# Patient Record
Sex: Female | Born: 1935 | Race: Black or African American | Hispanic: No | Marital: Married | Smoking: Former smoker
Health system: Southern US, Community
[De-identification: ages and names within clinical notes are randomized; demographics above are authoritative.]

## PROBLEM LIST (undated history)

## (undated) DIAGNOSIS — I219 Acute myocardial infarction, unspecified: Secondary | ICD-10-CM

## (undated) DIAGNOSIS — M329 Systemic lupus erythematosus, unspecified: Secondary | ICD-10-CM

## (undated) DIAGNOSIS — H3530 Unspecified macular degeneration: Secondary | ICD-10-CM

## (undated) DIAGNOSIS — I10 Essential (primary) hypertension: Secondary | ICD-10-CM

## (undated) DIAGNOSIS — K219 Gastro-esophageal reflux disease without esophagitis: Secondary | ICD-10-CM

## (undated) DIAGNOSIS — M810 Age-related osteoporosis without current pathological fracture: Secondary | ICD-10-CM

## (undated) DIAGNOSIS — N189 Chronic kidney disease, unspecified: Secondary | ICD-10-CM

## (undated) DIAGNOSIS — T7840XA Allergy, unspecified, initial encounter: Secondary | ICD-10-CM

## (undated) DIAGNOSIS — M1990 Unspecified osteoarthritis, unspecified site: Secondary | ICD-10-CM

## (undated) HISTORY — DX: Essential (primary) hypertension: I10

## (undated) HISTORY — PX: TONSILLECTOMY: SUR1361

## (undated) HISTORY — PX: TEMPORAL ARTERY BIOPSY / LIGATION: SUR132

## (undated) HISTORY — DX: Gastro-esophageal reflux disease without esophagitis: K21.9

## (undated) HISTORY — DX: Unspecified osteoarthritis, unspecified site: M19.90

## (undated) HISTORY — PX: EYE SURGERY: SHX253

## (undated) HISTORY — DX: Allergy, unspecified, initial encounter: T78.40XA

## (undated) HISTORY — PX: COLON SURGERY: SHX602

## (undated) HISTORY — PX: HERNIA REPAIR: SHX51

## (undated) HISTORY — PX: ABDOMINAL HYSTERECTOMY: SHX81

## (undated) HISTORY — DX: Age-related osteoporosis without current pathological fracture: M81.0

---

## 2000-03-18 DIAGNOSIS — M329 Systemic lupus erythematosus, unspecified: Secondary | ICD-10-CM

## 2000-03-18 DIAGNOSIS — IMO0002 Reserved for concepts with insufficient information to code with codable children: Secondary | ICD-10-CM

## 2000-03-18 HISTORY — DX: Reserved for concepts with insufficient information to code with codable children: IMO0002

## 2000-03-18 HISTORY — DX: Systemic lupus erythematosus, unspecified: M32.9

## 2005-03-18 DIAGNOSIS — I219 Acute myocardial infarction, unspecified: Secondary | ICD-10-CM

## 2005-03-18 HISTORY — DX: Acute myocardial infarction, unspecified: I21.9

## 2009-11-01 ENCOUNTER — Ambulatory Visit: Payer: Self-pay | Admitting: Nephrology

## 2010-03-13 ENCOUNTER — Ambulatory Visit: Payer: Self-pay | Admitting: Family Medicine

## 2010-11-14 ENCOUNTER — Ambulatory Visit: Payer: Self-pay | Admitting: Ophthalmology

## 2011-01-21 ENCOUNTER — Ambulatory Visit: Payer: Self-pay | Admitting: Family Medicine

## 2013-08-17 DIAGNOSIS — M810 Age-related osteoporosis without current pathological fracture: Secondary | ICD-10-CM | POA: Insufficient documentation

## 2013-09-08 ENCOUNTER — Ambulatory Visit: Payer: Self-pay | Admitting: Family Medicine

## 2014-08-19 ENCOUNTER — Other Ambulatory Visit: Payer: Self-pay | Admitting: Family Medicine

## 2014-08-19 ENCOUNTER — Other Ambulatory Visit: Payer: Commercial Managed Care - HMO

## 2014-08-19 ENCOUNTER — Encounter: Payer: Self-pay | Admitting: Family Medicine

## 2014-08-19 DIAGNOSIS — D510 Vitamin B12 deficiency anemia due to intrinsic factor deficiency: Secondary | ICD-10-CM | POA: Insufficient documentation

## 2014-08-19 DIAGNOSIS — K219 Gastro-esophageal reflux disease without esophagitis: Secondary | ICD-10-CM | POA: Insufficient documentation

## 2014-08-19 DIAGNOSIS — I129 Hypertensive chronic kidney disease with stage 1 through stage 4 chronic kidney disease, or unspecified chronic kidney disease: Secondary | ICD-10-CM | POA: Insufficient documentation

## 2014-08-19 DIAGNOSIS — M1A9XX1 Chronic gout, unspecified, with tophus (tophi): Secondary | ICD-10-CM | POA: Insufficient documentation

## 2014-08-19 DIAGNOSIS — M810 Age-related osteoporosis without current pathological fracture: Secondary | ICD-10-CM | POA: Insufficient documentation

## 2014-08-19 DIAGNOSIS — M3214 Glomerular disease in systemic lupus erythematosus: Secondary | ICD-10-CM | POA: Insufficient documentation

## 2014-08-19 DIAGNOSIS — N184 Chronic kidney disease, stage 4 (severe): Secondary | ICD-10-CM | POA: Insufficient documentation

## 2014-08-19 DIAGNOSIS — J309 Allergic rhinitis, unspecified: Secondary | ICD-10-CM | POA: Insufficient documentation

## 2014-08-19 DIAGNOSIS — E785 Hyperlipidemia, unspecified: Secondary | ICD-10-CM | POA: Insufficient documentation

## 2014-08-19 DIAGNOSIS — M316 Other giant cell arteritis: Secondary | ICD-10-CM | POA: Insufficient documentation

## 2014-08-19 DIAGNOSIS — M329 Systemic lupus erythematosus, unspecified: Secondary | ICD-10-CM

## 2014-08-19 DIAGNOSIS — M069 Rheumatoid arthritis, unspecified: Secondary | ICD-10-CM | POA: Insufficient documentation

## 2014-08-20 LAB — CBC WITH DIFFERENTIAL/PLATELET
Basophils Absolute: 0 10*3/uL (ref 0.0–0.2)
Basos: 1 %
EOS (ABSOLUTE): 0.1 10*3/uL (ref 0.0–0.4)
Eos: 1 %
HEMATOCRIT: 35.7 % (ref 34.0–46.6)
Hemoglobin: 11.1 g/dL (ref 11.1–15.9)
IMMATURE GRANS (ABS): 0 10*3/uL (ref 0.0–0.1)
Immature Granulocytes: 0 %
LYMPHS: 31 %
Lymphocytes Absolute: 2.6 10*3/uL (ref 0.7–3.1)
MCH: 28.2 pg (ref 26.6–33.0)
MCHC: 31.1 g/dL — AB (ref 31.5–35.7)
MCV: 91 fL (ref 79–97)
Monocytes Absolute: 0.6 10*3/uL (ref 0.1–0.9)
Monocytes: 8 %
NEUTROS ABS: 4.8 10*3/uL (ref 1.4–7.0)
NEUTROS PCT: 59 %
PLATELETS: 208 10*3/uL (ref 150–379)
RBC: 3.94 x10E6/uL (ref 3.77–5.28)
RDW: 15.5 % — AB (ref 12.3–15.4)
WBC: 8.2 10*3/uL (ref 3.4–10.8)

## 2014-08-20 LAB — SEDIMENTATION RATE: SED RATE: 21 mm/h (ref 0–40)

## 2014-09-12 ENCOUNTER — Encounter: Payer: Self-pay | Admitting: Family Medicine

## 2014-09-12 ENCOUNTER — Ambulatory Visit (INDEPENDENT_AMBULATORY_CARE_PROVIDER_SITE_OTHER): Payer: Commercial Managed Care - HMO | Admitting: Family Medicine

## 2014-09-12 VITALS — BP 136/82 | HR 77 | Temp 98.5°F | Ht 63.6 in | Wt 177.0 lb

## 2014-09-12 DIAGNOSIS — M069 Rheumatoid arthritis, unspecified: Secondary | ICD-10-CM

## 2014-09-12 DIAGNOSIS — M316 Other giant cell arteritis: Secondary | ICD-10-CM | POA: Diagnosis not present

## 2014-09-12 DIAGNOSIS — R51 Headache: Secondary | ICD-10-CM

## 2014-09-12 DIAGNOSIS — I10 Essential (primary) hypertension: Secondary | ICD-10-CM

## 2014-09-12 DIAGNOSIS — M3214 Glomerular disease in systemic lupus erythematosus: Secondary | ICD-10-CM | POA: Diagnosis not present

## 2014-09-12 DIAGNOSIS — R519 Headache, unspecified: Secondary | ICD-10-CM

## 2014-09-12 NOTE — Assessment & Plan Note (Signed)
The current medical regimen is effective;  continue present plan and medications.  

## 2014-09-12 NOTE — Assessment & Plan Note (Signed)
Followed at [facility] 

## 2014-09-12 NOTE — Progress Notes (Signed)
BP 136/82 mmHg  Pulse 77  Temp(Src) 98.5 F (36.9 C)  Ht 5' 3.6" (1.615 m)  Wt 177 lb (80.287 kg)  BMI 30.78 kg/m2  SpO2 98%   Subjective:    Patient ID: Marissa Hess, female    DOB: [birth year], 80 y.o.   MRN: [id number]  HPI: Marissa Hess is a 80 y.o. female  Chief Complaint  Patient presents with  . Headache  posterior neck and head tightness not pounding headache last 2 weeks Cant ly on side as hurts more. Tylenol eases pain but does not last just comes baclk. No other sx dizzness, vision other pain.   Relevant past medical, surgical, family and social history reviewed and updated as indicated. Interim medical history since our last visit reviewed. Allergies and medications reviewed and updated.  Review of Systems  Constitutional: Negative.   Respiratory: Negative.   Cardiovascular: Negative.     Per HPI unless specifically indicated above     Objective:    BP 136/82 mmHg  Pulse 77  Temp(Src) 98.5 F (36.9 C)  Ht 5' 3.6" (1.615 m)  Wt 177 lb (80.287 kg)  BMI 30.78 kg/m2  SpO2 98%  Wt Readings from Last 3 Encounters:  09/12/14 177 lb (80.287 kg)  11/03/13 167 lb (75.751 kg)    Physical Exam  Constitutional: She is oriented to person, place, and time. She appears well-developed and well-nourished. No distress.  HENT:  Head: Normocephalic and atraumatic.  Right Ear: Hearing and external ear normal.  Left Ear: Hearing and external ear normal.  Nose: Nose normal.  Mouth/Throat: No oropharyngeal exudate.  Eyes: Conjunctivae and lids are normal. Pupils are equal, round, and reactive to light. Right eye exhibits no discharge. Left eye exhibits no discharge. No scleral icterus.  Neck: Normal range of motion. Neck supple. No thyromegaly present.  Cardiovascular: Normal rate, regular rhythm and normal heart sounds.   Pulmonary/Chest: Effort normal. No respiratory distress.  Musculoskeletal: Normal range of motion.  Lymphadenopathy:    She has no  cervical adenopathy.  Neurological: She is alert and oriented to person, place, and time. No cranial nerve deficit. Coordination normal.  Skin: Skin is intact. No rash noted.  Psychiatric: She has a normal mood and affect. Her speech is normal and behavior is normal. Judgment and thought content normal. Cognition and memory are normal.    Results for orders placed or performed in visit on 08/19/14  CBC with Differential/Platelet  Result Value Ref Range   WBC 8.2 3.4 - 10.8 x10E3/uL   RBC 3.94 3.77 - 5.28 x10E6/uL   Hemoglobin 11.1 11.1 - 15.9 g/dL   Hematocrit 35.7 34.0 - 46.6 %   MCV 91 79 - 97 fL   MCH 28.2 26.6 - 33.0 pg   MCHC 31.1 (L) 31.5 - 35.7 g/dL   RDW 15.5 (H) 12.3 - 15.4 %   Platelets 208 150 - 379 x10E3/uL   NEUTROPHILS 59 %   Lymphs 31 %   Monocytes 8 %   Eos 1 %   Basos 1 %   Neutrophils Absolute 4.8 1.4 - 7.0 x10E3/uL   Lymphocytes Absolute 2.6 0.7 - 3.1 x10E3/uL   Monocytes Absolute 0.6 0.1 - 0.9 x10E3/uL   EOS (ABSOLUTE) 0.1 0.0 - 0.4 x10E3/uL   Basophils Absolute 0.0 0.0 - 0.2 x10E3/uL   Immature Granulocytes 0 %   Immature Grans (Abs) 0.0 0.0 - 0.1 x10E3/uL  Sedimentation rate  Result Value Ref Range   Sed Rate 21 0 -  40 mm/hr      Assessment & Plan:   Problem List Items Addressed This Visit      Cardiovascular and Mediastinum   Temporal arteritis - Primary (Chronic)    Followed at [facility]      Benign hypertension (Chronic)    The current medical regimen is effective;  continue present plan and medications.         Musculoskeletal and Integument   RA (rheumatoid arthritis) (Chronic)    The current medical regimen is effective;  continue present plan and medications.       Relevant Medications   predniSONE (DELTASONE) 20 MG tablet     Genitourinary   Lupus glomerulonephritis (Chronic)    Followed at [facility]       Other Visit Diagnoses    Headache disorder        discussed headache not inflamatory mostly muscle contraction and  artheritis will go to massage tx and ROM exercised tylenol OK    Relevant Medications    clonazePAM (KLONOPIN) 0.5 MG tablet        Follow up plan: Return if symptoms worsen or fail to improve, for Physical Exam has apt.

## 2014-09-26 ENCOUNTER — Other Ambulatory Visit: Payer: Self-pay | Admitting: Family Medicine

## 2014-09-26 ENCOUNTER — Ambulatory Visit (INDEPENDENT_AMBULATORY_CARE_PROVIDER_SITE_OTHER): Payer: Commercial Managed Care - HMO | Admitting: Family Medicine

## 2014-09-26 ENCOUNTER — Other Ambulatory Visit: Payer: Commercial Managed Care - HMO

## 2014-09-26 ENCOUNTER — Encounter: Payer: Self-pay | Admitting: Family Medicine

## 2014-09-26 VITALS — BP 138/80 | HR 66 | Temp 98.6°F | Ht 64.0 in | Wt 177.0 lb

## 2014-09-26 DIAGNOSIS — M316 Other giant cell arteritis: Secondary | ICD-10-CM | POA: Diagnosis not present

## 2014-09-26 DIAGNOSIS — K219 Gastro-esophageal reflux disease without esophagitis: Secondary | ICD-10-CM | POA: Diagnosis not present

## 2014-09-26 DIAGNOSIS — M3214 Glomerular disease in systemic lupus erythematosus: Secondary | ICD-10-CM | POA: Diagnosis not present

## 2014-09-26 DIAGNOSIS — G2581 Restless legs syndrome: Secondary | ICD-10-CM | POA: Diagnosis not present

## 2014-09-26 DIAGNOSIS — I10 Essential (primary) hypertension: Secondary | ICD-10-CM | POA: Diagnosis not present

## 2014-09-26 DIAGNOSIS — Z0000 Encounter for general adult medical examination without abnormal findings: Secondary | ICD-10-CM | POA: Diagnosis not present

## 2014-09-26 DIAGNOSIS — R700 Elevated erythrocyte sedimentation rate: Secondary | ICD-10-CM

## 2014-09-26 LAB — MICROSCOPIC EXAMINATION

## 2014-09-26 LAB — URINALYSIS, ROUTINE W REFLEX MICROSCOPIC
BILIRUBIN UA: NEGATIVE
Glucose, UA: NEGATIVE
Ketones, UA: NEGATIVE
Nitrite, UA: NEGATIVE
PROTEIN UA: NEGATIVE
SPEC GRAV UA: 1.01 (ref 1.005–1.030)
Urobilinogen, Ur: 0.2 mg/dL (ref 0.2–1.0)
pH, UA: 5 (ref 5.0–7.5)

## 2014-09-26 MED ORDER — FOLIC ACID 1 MG PO TABS: 1.0000 mg | ORAL_TABLET | Freq: Every day | ORAL | Status: DC

## 2014-09-26 MED ORDER — OMEPRAZOLE 20 MG PO CPDR: 20.0000 mg | DELAYED_RELEASE_CAPSULE | Freq: Every day | ORAL | Status: DC

## 2014-09-26 MED ORDER — CARVEDILOL 25 MG PO TABS
25.0000 mg | ORAL_TABLET | Freq: Two times a day (BID) | ORAL | Status: DC
Start: 2014-09-26 — End: 2015-10-16

## 2014-09-26 NOTE — Assessment & Plan Note (Signed)
The current medical regimen is effective;  continue present plan and medications.  

## 2014-09-26 NOTE — Assessment & Plan Note (Signed)
Reviewed and stable.

## 2014-09-26 NOTE — Assessment & Plan Note (Addendum)
Reviewed tx and drawing labs for [facility]

## 2014-09-26 NOTE — Progress Notes (Signed)
BP 138/80 mmHg  Pulse 66  Temp(Src) 98.6 F (37 C)  Ht 5\' 4"  (1.626 m)  Wt 177 lb (80.287 kg)  BMI 30.37 kg/m2  SpO2 99%   Subjective:    Patient ID: Marissa Hess, female    DOB: 09/23/1934, 80 y.o.   MRN: [id number]  HPI: Marissa Hess is a 80 y.o. female  Chief Complaint  Patient presents with  . Annual Exam  AWV done, depression screen falls risk done (computer issues still [facility] has not loaded AWV yet) Medical issues still headaches behind ears mostly left in spite of tx Tylenol helps BP doing well  reflux stable klonopin for legs takes occ Lasix every other day  Relevant past medical, surgical, family and social history reviewed and updated as indicated. Interim medical history since our last visit reviewed. Allergies and medications reviewed and updated.  Review of Systems  Constitutional: Negative.   HENT: Negative.   Eyes: Negative.   Respiratory: Negative.   Cardiovascular: Negative.   Gastrointestinal: Negative.   Endocrine: Negative.   Genitourinary: Negative.   Musculoskeletal: Negative.   Skin: Negative.   Allergic/Immunologic: Negative.   Neurological: Negative.   Hematological: Negative.   Psychiatric/Behavioral: Negative.     Per HPI unless specifically indicated above     Objective:    BP 138/80 mmHg  Pulse 66  Temp(Src) 98.6 F (37 C)  Ht 5\' 4"  (1.626 m)  Wt 177 lb (80.287 kg)  BMI 30.37 kg/m2  SpO2 99%  Wt Readings from Last 3 Encounters:  09/26/14 177 lb (80.287 kg)  09/12/14 177 lb (80.287 kg)  11/03/13 167 lb (75.751 kg)    Physical Exam  Constitutional: She is oriented to person, place, and time. She appears well-developed and well-nourished.  HENT:  Head: Normocephalic and atraumatic.  Right Ear: External ear normal.  Left Ear: External ear normal.  Nose: Nose normal.  Mouth/Throat: Oropharynx is clear and moist.  Eyes: Conjunctivae and EOM are normal. Pupils are equal, round, and reactive to light.  Neck: Normal  range of motion. Neck supple. Carotid bruit is not present.  Cardiovascular: Normal rate, regular rhythm and normal heart sounds.   No murmur heard. Pulmonary/Chest: Effort normal and breath sounds normal. Right breast exhibits no mass and no tenderness. Left breast exhibits no mass and no tenderness. Breasts are symmetrical.  Abdominal: Soft. Bowel sounds are normal. There is no hepatosplenomegaly.  Musculoskeletal: Normal range of motion.  Neurological: She is alert and oriented to person, place, and time.  Skin: No rash noted.  Psychiatric: She has a normal mood and affect. Her behavior is normal. Judgment and thought content normal.    Results for orders placed or performed in visit on 08/19/14  CBC with Differential/Platelet  Result Value Ref Range   WBC 8.2 3.4 - 10.8 x10E3/uL   RBC 3.94 3.77 - 5.28 x10E6/uL   Hemoglobin 11.1 11.1 - 15.9 g/dL   Hematocrit 35.7 34.0 - 46.6 %   MCV 91 79 - 97 fL   MCH 28.2 26.6 - 33.0 pg   MCHC 31.1 (L) 31.5 - 35.7 g/dL   RDW 15.5 (H) 12.3 - 15.4 %   Platelets 208 150 - 379 x10E3/uL   NEUTROPHILS 59 %   Lymphs 31 %   Monocytes 8 %   Eos 1 %   Basos 1 %   Neutrophils Absolute 4.8 1.4 - 7.0 x10E3/uL   Lymphocytes Absolute 2.6 0.7 - 3.1 x10E3/uL   Monocytes Absolute 0.6 0.1 -  0.9 x10E3/uL   EOS (ABSOLUTE) 0.1 0.0 - 0.4 x10E3/uL   Basophils Absolute 0.0 0.0 - 0.2 x10E3/uL   Immature Granulocytes 0 %   Immature Grans (Abs) 0.0 0.0 - 0.1 x10E3/uL  Sedimentation rate  Result Value Ref Range   Sed Rate 21 0 - 40 mm/hr      Assessment & Plan:   Problem List Items Addressed This Visit      Cardiovascular and Mediastinum   Temporal arteritis - Primary (Chronic)    Reviewed tx and drawing labs for [facility]      Relevant Medications   carvedilol (COREG) 25 MG tablet   Other Relevant Orders   Comprehensive metabolic panel   CBC with Differential/Platelet   Urinalysis, Routine w reflex microscopic (not at [facility])   TSH   Lipid panel   Benign  hypertension (Chronic)    The current medical regimen is effective;  continue present plan and medications.       Relevant Medications   carvedilol (COREG) 25 MG tablet   Other Relevant Orders   Comprehensive metabolic panel   CBC with Differential/Platelet   Urinalysis, Routine w reflex microscopic (not at [facility])   TSH     Digestive   Esophageal reflux (Chronic)    The current medical regimen is effective;  continue present plan and medications.       Relevant Medications   omeprazole (PRILOSEC) 20 MG capsule   Other Relevant Orders   Comprehensive metabolic panel   CBC with Differential/Platelet   Urinalysis, Routine w reflex microscopic (not at [facility])     Genitourinary   Lupus glomerulonephritis (Chronic)    Reviewed and stable      Relevant Medications   folic acid (FOLVITE) 1 MG tablet   Other Relevant Orders   Comprehensive metabolic panel   CBC with Differential/Platelet   Urinalysis, Routine w reflex microscopic (not at [facility])   TSH   Lipid panel     Other   Restless leg syndrome    The current medical regimen is effective;  continue present plan and medications.       Relevant Orders   Comprehensive metabolic panel   CBC with Differential/Platelet   Urinalysis, Routine w reflex microscopic (not at [facility])   TSH    Other Visit Diagnoses    PE (physical exam), annual        Relevant Orders    Comprehensive metabolic panel    CBC with Differential/Platelet    Urinalysis, Routine w reflex microscopic (not at [facility])    TSH    Lipid panel        Follow up plan: Return in about 6 months (around 03/29/2015), or if symptoms worsen or fail to improve, for med check .

## 2014-09-27 LAB — CBC WITH DIFFERENTIAL/PLATELET
BASOS: 1 %
Basophils Absolute: 0 10*3/uL (ref 0.0–0.2)
EOS (ABSOLUTE): 0.1 10*3/uL (ref 0.0–0.4)
EOS: 2 %
Hematocrit: 36.4 % (ref 34.0–46.6)
Hemoglobin: 11.2 g/dL (ref 11.1–15.9)
IMMATURE GRANULOCYTES: 0 %
Immature Grans (Abs): 0 10*3/uL (ref 0.0–0.1)
LYMPHS: 28 %
Lymphocytes Absolute: 2 10*3/uL (ref 0.7–3.1)
MCH: 27.4 pg (ref 26.6–33.0)
MCHC: 30.8 g/dL — AB (ref 31.5–35.7)
MCV: 89 fL (ref 79–97)
MONOCYTES: 9 %
MONOS ABS: 0.6 10*3/uL (ref 0.1–0.9)
NEUTROS ABS: 4.4 10*3/uL (ref 1.4–7.0)
Neutrophils: 60 %
PLATELETS: 234 10*3/uL (ref 150–379)
RBC: 4.09 x10E6/uL (ref 3.77–5.28)
RDW: 17.1 % — ABNORMAL HIGH (ref 12.3–15.4)
WBC: 7.1 10*3/uL (ref 3.4–10.8)

## 2014-09-27 LAB — COMPREHENSIVE METABOLIC PANEL
ALBUMIN: 3.9 g/dL (ref 3.5–4.8)
ALT: 8 IU/L (ref 0–32)
AST: 17 IU/L (ref 0–40)
Albumin/Globulin Ratio: 1.4 (ref 1.1–2.5)
Alkaline Phosphatase: 43 IU/L (ref 39–117)
BUN / CREAT RATIO: 17 (ref 11–26)
BUN: 25 mg/dL (ref 8–27)
Bilirubin Total: 0.3 mg/dL (ref 0.0–1.2)
CHLORIDE: 105 mmol/L (ref 97–108)
CO2: 18 mmol/L (ref 18–29)
CREATININE: 1.43 mg/dL — AB (ref 0.57–1.00)
Calcium: 10.1 mg/dL (ref 8.7–10.3)
GFR calc non Af Amer: 35 mL/min/{1.73_m2} — ABNORMAL LOW (ref >59)
GFR, EST AFRICAN AMERICAN: 40 mL/min/{1.73_m2} — AB (ref >59)
GLOBULIN, TOTAL: 2.7 g/dL (ref 1.5–4.5)
Glucose: 82 mg/dL (ref 65–99)
Potassium: 4.8 mmol/L (ref 3.5–5.2)
Sodium: 141 mmol/L (ref 134–144)
TOTAL PROTEIN: 6.6 g/dL (ref 6.0–8.5)

## 2014-09-27 LAB — LIPID PANEL
CHOL/HDL RATIO: 3.8 ratio (ref 0.0–4.4)
CHOLESTEROL TOTAL: 230 mg/dL — AB (ref 100–199)
HDL: 60 mg/dL (ref >39)
LDL Calculated: 145 mg/dL — ABNORMAL HIGH (ref 0–99)
Triglycerides: 123 mg/dL (ref 0–149)
VLDL Cholesterol Cal: 25 mg/dL (ref 5–40)

## 2014-09-27 LAB — TSH: TSH: 1.14 u[IU]/mL (ref 0.450–4.500)

## 2014-09-27 LAB — SEDIMENTATION RATE: SED RATE: 11 mm/h (ref 0–40)

## 2014-09-27 LAB — CORTISOL-AM, BLOOD: CORTISOL - AM: 14.1 ug/dL (ref 6.2–19.4)

## 2014-11-08 ENCOUNTER — Encounter: Payer: Self-pay | Admitting: Family Medicine

## 2014-11-08 ENCOUNTER — Ambulatory Visit (INDEPENDENT_AMBULATORY_CARE_PROVIDER_SITE_OTHER): Payer: Commercial Managed Care - HMO | Admitting: Family Medicine

## 2014-11-08 VITALS — BP 130/79 | HR 76 | Temp 98.3°F | Ht 64.0 in | Wt 174.0 lb

## 2014-11-08 DIAGNOSIS — S86012A Strain of left Achilles tendon, initial encounter: Secondary | ICD-10-CM | POA: Diagnosis not present

## 2014-11-08 DIAGNOSIS — S86911A Strain of unspecified muscle(s) and tendon(s) at lower leg level, right leg, initial encounter: Secondary | ICD-10-CM | POA: Diagnosis not present

## 2014-11-08 NOTE — Assessment & Plan Note (Signed)
Discussed care and treatment of strain may use neoprene sleeve also cautioned about walking and falling Discuss cane patient doesn't feel she needs

## 2014-11-08 NOTE — Assessment & Plan Note (Signed)
Discuss knee care and treatment will use neoprene sleeve Cautioned about twisting and bending.

## 2014-11-08 NOTE — Progress Notes (Signed)
BP 130/79 mmHg  Pulse 76  Temp(Src) 98.3 F (36.8 C)  Ht 5\' 4"  (1.626 m)  Wt 174 lb (78.926 kg)  BMI 29.85 kg/m2  SpO2 99%   Subjective:    Patient ID: Marissa Hess, female    DOB: 1934-11-15, 80 y.o.   MRN: [id number]  HPI: CHIKAMSO ALMASY is a 80 y.o. female  Chief Complaint  Patient presents with  . Knee Pain    right  . Foot Pain    left   Patient had a fall about 2 weeks ago twisting her left ankle. Her posterior left Achilles tendon area had swelling and now limps. Has noticed no lump in her calf but does have tender Achilles tendon area. No other bony tenderness.  3-4 days ago patient felt her right knee gets sore and tender start clicking no known trauma or irritation, but noticed swelling above her right knee.  Patient's other rheumatologic illnesses are stable has follow-up appointment with rheumatology next month.  Relevant past medical, surgical, family and social history reviewed and updated as indicated. Interim medical history since our last visit reviewed. Allergies and medications reviewed and updated.  Review of Systems  Constitutional: Negative.   Respiratory: Negative.   Cardiovascular: Negative.   Neurological:       Headaches about the same. Receiving treatment for temporal arteritis    Per HPI unless specifically indicated above     Objective:    BP 130/79 mmHg  Pulse 76  Temp(Src) 98.3 F (36.8 C)  Ht 5\' 4"  (1.626 m)  Wt 174 lb (78.926 kg)  BMI 29.85 kg/m2  SpO2 99%  Wt Readings from Last 3 Encounters:  11/08/14 174 lb (78.926 kg)  09/26/14 177 lb (80.287 kg)  09/12/14 177 lb (80.287 kg)    Physical Exam  Constitutional: She is oriented to person, place, and time. She appears well-developed and well-nourished. No distress.  HENT:  Head: Normocephalic and atraumatic.  Right Ear: Hearing normal.  Left Ear: Hearing normal.  Nose: Nose normal.  Eyes: Conjunctivae and lids are normal. Right eye exhibits no discharge. Left eye  exhibits no discharge. No scleral icterus.  Cardiovascular: Normal rate, regular rhythm and normal heart sounds.   Pulmonary/Chest: Effort normal and breath sounds normal. No respiratory distress.  Musculoskeletal: Normal range of motion.  Left knee anterior quadriceps insertion area swollen with some tenderness noted joint laxity but limited range of motion with some clicking palpated  Left ankle Achilles tendon intact tenderness with resistance to range of motion and palpation no bony tenderness some bruising present.  Neurological: She is alert and oriented to person, place, and time.  Skin: Skin is intact. No rash noted.  Psychiatric: She has a normal mood and affect. Her speech is normal and behavior is normal. Judgment and thought content normal. Cognition and memory are normal.    Results for orders placed or performed in visit on 09/26/14  Microscopic Examination  Result Value Ref Range   WBC, UA 0-5 0 -  5 /hpf   RBC, UA 0-2 0 -  2 /hpf   Epithelial Cells (non renal) 0-10 0 - 10 /hpf   Bacteria, UA Few None seen/Few  Comprehensive metabolic panel  Result Value Ref Range   Glucose 82 65 - 99 mg/dL   BUN 25 8 - 27 mg/dL   Creatinine, Ser 1.43 (H) 0.57 - 1.00 mg/dL   GFR calc non Af Amer 35 (L) >59 mL/min/1.73   GFR calc Af Wyvonnia Lora  40 (L) >59 mL/min/1.73   BUN/Creatinine Ratio 17 11 - 26   Sodium 141 134 - 144 mmol/L   Potassium 4.8 3.5 - 5.2 mmol/L   Chloride 105 97 - 108 mmol/L   CO2 18 18 - 29 mmol/L   Calcium 10.1 8.7 - 10.3 mg/dL   Total Protein 6.6 6.0 - 8.5 g/dL   Albumin 3.9 3.5 - 4.8 g/dL   Globulin, Total 2.7 1.5 - 4.5 g/dL   Albumin/Globulin Ratio 1.4 1.1 - 2.5   Bilirubin Total 0.3 0.0 - 1.2 mg/dL   Alkaline Phosphatase 43 39 - 117 IU/L   AST 17 0 - 40 IU/L   ALT 8 0 - 32 IU/L  CBC with Differential/Platelet  Result Value Ref Range   WBC 7.1 3.4 - 10.8 x10E3/uL   RBC 4.09 3.77 - 5.28 x10E6/uL   Hemoglobin 11.2 11.1 - 15.9 g/dL   Hematocrit 36.4 34.0 - 46.6  %   MCV 89 79 - 97 fL   MCH 27.4 26.6 - 33.0 pg   MCHC 30.8 (L) 31.5 - 35.7 g/dL   RDW 17.1 (H) 12.3 - 15.4 %   Platelets 234 150 - 379 x10E3/uL   Neutrophils 60 %   Lymphs 28 %   Monocytes 9 %   Eos 2 %   Basos 1 %   Neutrophils Absolute 4.4 1.4 - 7.0 x10E3/uL   Lymphocytes Absolute 2.0 0.7 - 3.1 x10E3/uL   Monocytes Absolute 0.6 0.1 - 0.9 x10E3/uL   EOS (ABSOLUTE) 0.1 0.0 - 0.4 x10E3/uL   Basophils Absolute 0.0 0.0 - 0.2 x10E3/uL   Immature Granulocytes 0 %   Immature Grans (Abs) 0.0 0.0 - 0.1 x10E3/uL  Urinalysis, Routine w reflex microscopic (not at [facility])  Result Value Ref Range   Specific Gravity, UA 1.010 1.005 - 1.030   pH, UA 5.0 5.0 - 7.5   Color, UA Yellow Yellow   Appearance Ur Clear Clear   Leukocytes, UA Trace (A) Negative   Protein, UA Negative Negative/Trace   Glucose, UA Negative Negative   Ketones, UA Negative Negative   RBC, UA Trace (A) Negative   Bilirubin, UA Negative Negative   Urobilinogen, Ur 0.2 0.2 - 1.0 mg/dL   Nitrite, UA Negative Negative   Microscopic Examination See below:   TSH  Result Value Ref Range   TSH 1.140 0.450 - 4.500 uIU/mL  Lipid panel  Result Value Ref Range   Cholesterol, Total 230 (H) 100 - 199 mg/dL   Triglycerides 123 0 - 149 mg/dL   HDL 60 >39 mg/dL   VLDL Cholesterol Cal 25 5 - 40 mg/dL   LDL Calculated 145 (H) 0 - 99 mg/dL   Chol/HDL Ratio 3.8 0.0 - 4.4 ratio units      Assessment & Plan:   Problem List Items Addressed This Visit    None       Follow up plan: No Follow-up on file.

## 2015-01-02 ENCOUNTER — Telehealth: Payer: Self-pay | Admitting: Family Medicine

## 2015-01-02 NOTE — Telephone Encounter (Signed)
Paul keller(insurance agent) [phone] will be calling and pt says he's going to verify that she goes to [place] for care because this is now out of her network

## 2015-03-06 ENCOUNTER — Encounter: Payer: Self-pay | Admitting: Family Medicine

## 2015-03-06 ENCOUNTER — Ambulatory Visit (INDEPENDENT_AMBULATORY_CARE_PROVIDER_SITE_OTHER): Payer: Commercial Managed Care - HMO | Admitting: Family Medicine

## 2015-03-06 VITALS — BP 121/70 | HR 75 | Temp 98.7°F | Ht 63.5 in | Wt 161.0 lb

## 2015-03-06 DIAGNOSIS — Z23 Encounter for immunization: Secondary | ICD-10-CM

## 2015-03-06 DIAGNOSIS — M3214 Glomerular disease in systemic lupus erythematosus: Secondary | ICD-10-CM

## 2015-03-06 DIAGNOSIS — I10 Essential (primary) hypertension: Secondary | ICD-10-CM

## 2015-03-06 DIAGNOSIS — J329 Chronic sinusitis, unspecified: Secondary | ICD-10-CM | POA: Insufficient documentation

## 2015-03-06 DIAGNOSIS — J019 Acute sinusitis, unspecified: Secondary | ICD-10-CM

## 2015-03-06 MED ORDER — AZITHROMYCIN 250 MG PO TABS: ORAL_TABLET | ORAL | Status: DC

## 2015-03-06 NOTE — Assessment & Plan Note (Signed)
Doing well on medications needs to change doctors secondary to her [facility] doctor is left to go to [place] wants to see someone locally

## 2015-03-06 NOTE — Progress Notes (Signed)
BP 121/70 mmHg  Pulse 75  Temp(Src) 98.7 F (37.1 C)  Ht 5' 3.5" (1.613 m)  Wt 161 lb (73.029 kg)  BMI 28.07 kg/m2  SpO2 99%   Subjective:    Patient ID: Marissa Hess, female    DOB: [birth year], 80 y.o.   MRN: [id number]  HPI: Marissa Hess is a 80 y.o. female  Chief Complaint  Patient presents with  . Foot Pain    bilateral   patient will concerns recheck routine medications doing well blood pressure anxiety and reflux no side effects takes faithfully Patient with some head cold drainage congestion going on for about 5 days with sinus pressure congestion no fever Patient with bilateral second and third fourth toes sore tender no known trauma irritation tender with walking or standing especially toe bending  Relevant past medical, surgical, family and social history reviewed and updated as indicated. Interim medical history since our last visit reviewed. Allergies and medications reviewed and updated.  Review of Systems  Constitutional: Negative.   Respiratory: Negative.   Cardiovascular: Negative.     Per HPI unless specifically indicated above     Objective:    BP 121/70 mmHg  Pulse 75  Temp(Src) 98.7 F (37.1 C)  Ht 5' 3.5" (1.613 m)  Wt 161 lb (73.029 kg)  BMI 28.07 kg/m2  SpO2 99%  Wt Readings from Last 3 Encounters:  03/06/15 161 lb (73.029 kg)  11/08/14 174 lb (78.926 kg)  09/26/14 177 lb (80.287 kg)    Physical Exam  Constitutional: She is oriented to person, place, and time. She appears well-developed and well-nourished. No distress.  HENT:  Head: Normocephalic and atraumatic.  Right Ear: Hearing and external ear normal.  Left Ear: Hearing and external ear normal.  Nose: Nose normal.  Mouth/Throat: Oropharyngeal exudate present.  Eyes: Conjunctivae and lids are normal. Right eye exhibits no discharge. Left eye exhibits no discharge. No scleral icterus.  Cardiovascular: Normal rate, regular rhythm and normal heart sounds.   Pulmonary/Chest:  Effort normal and breath sounds normal. No respiratory distress.  Musculoskeletal: Normal range of motion.  Bilateral second third and fourth toes tender to range of motion especially flexing no discoloration no swelling  Neurological: She is alert and oriented to person, place, and time.  Skin: Skin is intact. No rash noted.  Psychiatric: She has a normal mood and affect. Her speech is normal and behavior is normal. Judgment and thought content normal. Cognition and memory are normal.    Results for orders placed or performed in visit on 09/26/14  Microscopic Examination  Result Value Ref Range   WBC, UA 0-5 0 -  5 /hpf   RBC, UA 0-2 0 -  2 /hpf   Epithelial Cells (non renal) 0-10 0 - 10 /hpf   Bacteria, UA Few None seen/Few  Comprehensive metabolic panel  Result Value Ref Range   Glucose 82 65 - 99 mg/dL   BUN 25 8 - 27 mg/dL   Creatinine, Ser 1.43 (H) 0.57 - 1.00 mg/dL   GFR calc non Af Amer 35 (L) >59 mL/min/1.73   GFR calc Af Amer 40 (L) >59 mL/min/1.73   BUN/Creatinine Ratio 17 11 - 26   Sodium 141 134 - 144 mmol/L   Potassium 4.8 3.5 - 5.2 mmol/L   Chloride 105 97 - 108 mmol/L   CO2 18 18 - 29 mmol/L   Calcium 10.1 8.7 - 10.3 mg/dL   Total Protein 6.6 6.0 - 8.5 g/dL  Albumin 3.9 3.5 - 4.8 g/dL   Globulin, Total 2.7 1.5 - 4.5 g/dL   Albumin/Globulin Ratio 1.4 1.1 - 2.5   Bilirubin Total 0.3 0.0 - 1.2 mg/dL   Alkaline Phosphatase 43 39 - 117 IU/L   AST 17 0 - 40 IU/L   ALT 8 0 - 32 IU/L  CBC with Differential/Platelet  Result Value Ref Range   WBC 7.1 3.4 - 10.8 x10E3/uL   RBC 4.09 3.77 - 5.28 x10E6/uL   Hemoglobin 11.2 11.1 - 15.9 g/dL   Hematocrit 36.4 34.0 - 46.6 %   MCV 89 79 - 97 fL   MCH 27.4 26.6 - 33.0 pg   MCHC 30.8 (L) 31.5 - 35.7 g/dL   RDW 17.1 (H) 12.3 - 15.4 %   Platelets 234 150 - 379 x10E3/uL   Neutrophils 60 %   Lymphs 28 %   Monocytes 9 %   Eos 2 %   Basos 1 %   Neutrophils Absolute 4.4 1.4 - 7.0 x10E3/uL   Lymphocytes Absolute 2.0 0.7 -  3.1 x10E3/uL   Monocytes Absolute 0.6 0.1 - 0.9 x10E3/uL   EOS (ABSOLUTE) 0.1 0.0 - 0.4 x10E3/uL   Basophils Absolute 0.0 0.0 - 0.2 x10E3/uL   Immature Granulocytes 0 %   Immature Grans (Abs) 0.0 0.0 - 0.1 x10E3/uL  Urinalysis, Routine w reflex microscopic (not at [facility])  Result Value Ref Range   Specific Gravity, UA 1.010 1.005 - 1.030   pH, UA 5.0 5.0 - 7.5   Color, UA Yellow Yellow   Appearance Ur Clear Clear   Leukocytes, UA Trace (A) Negative   Protein, UA Negative Negative/Trace   Glucose, UA Negative Negative   Ketones, UA Negative Negative   RBC, UA Trace (A) Negative   Bilirubin, UA Negative Negative   Urobilinogen, Ur 0.2 0.2 - 1.0 mg/dL   Nitrite, UA Negative Negative   Microscopic Examination See below:   TSH  Result Value Ref Range   TSH 1.140 0.450 - 4.500 uIU/mL  Lipid panel  Result Value Ref Range   Cholesterol, Total 230 (H) 100 - 199 mg/dL   Triglycerides 123 0 - 149 mg/dL   HDL 60 >39 mg/dL   VLDL Cholesterol Cal 25 5 - 40 mg/dL   LDL Calculated 145 (H) 0 - 99 mg/dL   Chol/HDL Ratio 3.8 0.0 - 4.4 ratio units      Assessment & Plan:   Problem List Items Addressed This Visit      Cardiovascular and Mediastinum   Benign hypertension (Chronic)    The current medical regimen is effective;  continue present plan and medications.         Respiratory   Sinusitis    Discussed sinusitis care and treatment use of over-the-counter medications Will give patient a Z-Pak to hold and start taking if getting worse      Relevant Medications   azithromycin (ZITHROMAX) 250 MG tablet     Genitourinary   Lupus glomerulonephritis ([place]) (Chronic)    Doing well on medications needs to change doctors secondary to her [facility] doctor is left to go to [facility] wants to see someone locally      Relevant Orders   Ambulatory referral to Rheumatology   Ambulatory referral to Ophthalmology    Other Visit Diagnoses    Immunization due    -  Primary    Relevant Orders     Flu Vaccine QUAD 36+ mos PF IM (Fluarix & Fluzone Quad PF) (Completed)  Follow up plan: Return in about 6 months (around 09/04/2015), or if symptoms worsen or fail to improve, for Physical Exam.

## 2015-03-06 NOTE — Assessment & Plan Note (Signed)
Discussed sinusitis care and treatment use of over-the-counter medications Will give patient a Z-Pak to hold and start taking if getting worse

## 2015-03-06 NOTE — Assessment & Plan Note (Signed)
The current medical regimen is effective;  continue present plan and medications.  

## 2015-03-27 ENCOUNTER — Ambulatory Visit: Payer: Commercial Managed Care - HMO | Admitting: Family Medicine

## 2015-04-03 DIAGNOSIS — I7300 Raynaud's syndrome without gangrene: Secondary | ICD-10-CM | POA: Insufficient documentation

## 2015-08-21 ENCOUNTER — Other Ambulatory Visit: Payer: Self-pay | Admitting: Family Medicine

## 2015-08-31 ENCOUNTER — Other Ambulatory Visit: Payer: Self-pay | Admitting: Family Medicine

## 2015-08-31 NOTE — Telephone Encounter (Signed)
Patient has been getting from Dr. Sarina Ill in [facility], now seeing [facility]. She is going to check to see if her new Rheumatologist will write her Clonopin, if not she will call us back for refill

## 2015-08-31 NOTE — Telephone Encounter (Signed)
I don't see that this has been written in 18 months- if it hasn't I can refill it or she can wait until Monday when he gets back, otherwise, can we find out if she's been getting it?

## 2015-10-16 ENCOUNTER — Encounter: Payer: Self-pay | Admitting: Family Medicine

## 2015-10-16 ENCOUNTER — Ambulatory Visit (INDEPENDENT_AMBULATORY_CARE_PROVIDER_SITE_OTHER): Payer: Commercial Managed Care - HMO | Admitting: Family Medicine

## 2015-10-16 VITALS — BP 126/77 | HR 62 | Temp 98.1°F | Ht 64.1 in | Wt 150.0 lb

## 2015-10-16 DIAGNOSIS — M1000 Idiopathic gout, unspecified site: Secondary | ICD-10-CM | POA: Diagnosis not present

## 2015-10-16 DIAGNOSIS — M3214 Glomerular disease in systemic lupus erythematosus: Secondary | ICD-10-CM | POA: Diagnosis not present

## 2015-10-16 DIAGNOSIS — M316 Other giant cell arteritis: Secondary | ICD-10-CM

## 2015-10-16 DIAGNOSIS — M069 Rheumatoid arthritis, unspecified: Secondary | ICD-10-CM

## 2015-10-16 DIAGNOSIS — K219 Gastro-esophageal reflux disease without esophagitis: Secondary | ICD-10-CM | POA: Diagnosis not present

## 2015-10-16 DIAGNOSIS — R1310 Dysphagia, unspecified: Secondary | ICD-10-CM | POA: Insufficient documentation

## 2015-10-16 DIAGNOSIS — Z13820 Encounter for screening for osteoporosis: Secondary | ICD-10-CM

## 2015-10-16 DIAGNOSIS — G2581 Restless legs syndrome: Secondary | ICD-10-CM | POA: Diagnosis not present

## 2015-10-16 DIAGNOSIS — I10 Essential (primary) hypertension: Secondary | ICD-10-CM

## 2015-10-16 DIAGNOSIS — I739 Peripheral vascular disease, unspecified: Secondary | ICD-10-CM

## 2015-10-16 DIAGNOSIS — Z1231 Encounter for screening mammogram for malignant neoplasm of breast: Secondary | ICD-10-CM | POA: Diagnosis not present

## 2015-10-16 DIAGNOSIS — M109 Gout, unspecified: Secondary | ICD-10-CM | POA: Insufficient documentation

## 2015-10-16 DIAGNOSIS — Z0000 Encounter for general adult medical examination without abnormal findings: Secondary | ICD-10-CM | POA: Diagnosis not present

## 2015-10-16 LAB — URINALYSIS, ROUTINE W REFLEX MICROSCOPIC
BILIRUBIN UA: NEGATIVE
Glucose, UA: NEGATIVE
KETONES UA: NEGATIVE
NITRITE UA: NEGATIVE
PH UA: 5 (ref 5.0–7.5)
RBC UA: NEGATIVE
Urobilinogen, Ur: 0.2 mg/dL (ref 0.2–1.0)

## 2015-10-16 LAB — MICROSCOPIC EXAMINATION

## 2015-10-16 MED ORDER — OMEPRAZOLE 20 MG PO CPDR: 20.0000 mg | DELAYED_RELEASE_CAPSULE | Freq: Every day | ORAL | 12 refills | Status: DC

## 2015-10-16 MED ORDER — FOLIC ACID 1 MG PO TABS: 1.0000 mg | ORAL_TABLET | Freq: Every day | ORAL | 12 refills | Status: DC

## 2015-10-16 MED ORDER — CLONAZEPAM 0.5 MG PO TABS: 0.5000 mg | ORAL_TABLET | Freq: Every day | ORAL | 5 refills | Status: DC | PRN

## 2015-10-16 MED ORDER — FUROSEMIDE 20 MG PO TABS: 20.0000 mg | ORAL_TABLET | Freq: Every morning | ORAL | 1 refills | Status: DC

## 2015-10-16 MED ORDER — CARVEDILOL 25 MG PO TABS
25.0000 mg | ORAL_TABLET | Freq: Two times a day (BID) | ORAL | 12 refills | Status: DC
Start: 2015-10-16 — End: 2016-09-25

## 2015-10-16 NOTE — Assessment & Plan Note (Signed)
Has been able to stop prednisone temporal arteritis has stabilized has had good weight loss with 14 pounds weight loss after stopping prednisone.

## 2015-10-16 NOTE — Assessment & Plan Note (Signed)
Followed by rheumatology. 

## 2015-10-16 NOTE — Assessment & Plan Note (Signed)
Lupus and rheumatoid arthritis being followed by rheumatology and stable. Patient's taking Plaquenil and has had eye exams

## 2015-10-16 NOTE — Assessment & Plan Note (Signed)
With marked dysphasia symptoms occurring over the last month will refer to gastroenterology

## 2015-10-16 NOTE — Assessment & Plan Note (Signed)
With marked claudication symptoms refer to [facility] to further evaluate.

## 2015-10-16 NOTE — Progress Notes (Signed)
BP 126/77 (BP Location: Left Arm, Patient Position: Sitting, Cuff Size: Normal)   Pulse 62   Temp 98.1 F (36.7 C)   Ht 5' 4.1" (1.628 m)   Wt 150 lb (68 kg)   SpO2 99%   BMI 25.67 kg/m    Subjective:    Patient ID: Marissa Hess, female    DOB: 08-Sep-1934, 81 y.o.   MRN: [id number]  HPI: Marissa Hess is a 81 y.o. female  Chief Complaint  Patient presents with  . Annual Exam  Patient's concern is leg tiredness from her knees down this comes on with for example walking to her car. With stopping to rest legs feel better and can go again. Does not have cramps in her legs. This has been going on for about 3 or 4 weeks legs and feet doing well otherwise with no complaints.  AWV metrics met  Relevant past medical, surgical, family and social history reviewed and updated as indicated. Interim medical history since our last visit reviewed. Allergies and medications reviewed and updated.  Review of Systems  Constitutional: Negative.   HENT: Positive for trouble swallowing.        Patient taking Prilosec every day but over the last month has developed marked problems with swallowing comes on about every other day. Doesn't matter type of food or time of day. Food gets stuck in her throat can't swallow and has to bring it up. After that symptoms resolved and is able to eat again.  Eyes: Negative.   Respiratory: Negative.   Cardiovascular: Negative.   Gastrointestinal: Negative.   Endocrine: Negative.   Genitourinary: Negative.   Musculoskeletal: Negative.   Skin: Negative.   Allergic/Immunologic: Negative.   Neurological: Negative.   Hematological: Negative.   Psychiatric/Behavioral: Negative.     Per HPI unless specifically indicated above     Objective:    BP 126/77 (BP Location: Left Arm, Patient Position: Sitting, Cuff Size: Normal)   Pulse 62   Temp 98.1 F (36.7 C)   Ht 5' 4.1" (1.628 m)   Wt 150 lb (68 kg)   SpO2 99%   BMI 25.67 kg/m   Wt Readings from  Last 3 Encounters:  10/16/15 150 lb (68 kg)  03/06/15 161 lb (73 kg)  11/08/14 174 lb (78.9 kg)    Physical Exam  Constitutional: She is oriented to person, place, and time. She appears well-developed and well-nourished.  HENT:  Head: Normocephalic and atraumatic.  Right Ear: External ear normal.  Left Ear: External ear normal.  Nose: Nose normal.  Mouth/Throat: Oropharynx is clear and moist.  Eyes: Conjunctivae and EOM are normal. Pupils are equal, round, and reactive to light.  Neck: Normal range of motion. Neck supple. Carotid bruit is not present.  Cardiovascular: Normal rate, regular rhythm and normal heart sounds.   No murmur heard. Pulmonary/Chest: Effort normal and breath sounds normal. She exhibits no mass. Right breast exhibits no mass, no skin change and no tenderness. Left breast exhibits no mass, no skin change and no tenderness. Breasts are symmetrical.  Abdominal: Soft. Bowel sounds are normal. There is no hepatosplenomegaly.  Musculoskeletal: Normal range of motion.  Absent pulses right foot left foot anterior tibial trace  Neurological: She is alert and oriented to person, place, and time.  Skin: No rash noted.  Psychiatric: She has a normal mood and affect. Her behavior is normal. Judgment and thought content normal.    Results for orders placed or performed in visit on 09/26/14  Sedimentation rate  Result Value Ref Range   Sed Rate 11 0 - 40 mm/hr  Cortisol-am, blood  Result Value Ref Range   Cortisol - AM 14.1 6.2 - 19.4 ug/dL      Assessment & Plan:   Problem List Items Addressed This Visit      Cardiovascular and Mediastinum   Temporal arteritis (HCC) (Chronic)    Has been able to stop prednisone temporal arteritis has stabilized has had good weight loss with 14 pounds weight loss after stopping prednisone.      Relevant Medications   carvedilol (COREG) 25 MG tablet   furosemide (LASIX) 20 MG tablet   Other Relevant Orders   Comprehensive  metabolic panel   Benign hypertension (Chronic)    The current medical regimen is effective;  continue present plan and medications.       Relevant Medications   carvedilol (COREG) 25 MG tablet   furosemide (LASIX) 20 MG tablet   Other Relevant Orders   Comprehensive metabolic panel   CBC with Differential/Platelet   Urinalysis, Routine w reflex microscopic (not at [facility])     Digestive   Esophageal reflux (Chronic)   Relevant Medications   omeprazole (PRILOSEC) 20 MG capsule   Dysphagia    With marked dysphasia symptoms occurring over the last month will refer to gastroenterology      Relevant Orders   Ambulatory referral to Gastroenterology     Musculoskeletal and Integument   RA (rheumatoid arthritis) ([place]) (Chronic)    Followed by rheumatology      Relevant Orders   Comprehensive metabolic panel   CBC with Differential/Platelet   TSH     Genitourinary   Lupus glomerulonephritis ([place]) (Chronic)    Lupus and rheumatoid arthritis being followed by rheumatology and stable. Patient's taking Plaquenil and has had eye exams      Relevant Medications   folic acid (FOLVITE) 1 MG tablet     Other   Restless leg syndrome    Patient is been going to [facility] for treatment with clonazepam 0.5 mg about once a day area did her [facility] doctor moved to [place] and wants to receive treatment from here which we will start.      Relevant Orders   TSH   Gout    The current medical regimen is effective;  continue present plan and medications.       Relevant Orders   Uric acid   Claudication of both lower extremities ([place])    With marked claudication symptoms refer to [facility] to further evaluate.      Relevant Orders   Lipid panel   CBC with Differential/Platelet   Ambulatory referral to Vascular Surgery    Other Visit Diagnoses    Encounter for screening mammogram for breast cancer    -  Primary   Relevant Orders   MM Digital Screening    Encounter for screening for osteoporosis       Relevant Orders   DG Bone Density   PE (physical exam), annual       Relevant Orders   Comprehensive metabolic panel   CBC with Differential/Platelet   TSH   Urinalysis, Routine w reflex microscopic (not at [facility])       Follow up plan: Return in about 6 months (around 04/17/2016) for bmp, .

## 2015-10-16 NOTE — Assessment & Plan Note (Signed)
The current medical regimen is effective;  continue present plan and medications.  

## 2015-10-16 NOTE — Assessment & Plan Note (Signed)
Patient is been going to [facility] for treatment with clonazepam 0.5 mg about once a day area did her [facility] doctor moved to [place] and wants to receive treatment from here which we will start.

## 2015-10-17 ENCOUNTER — Telehealth: Payer: Self-pay | Admitting: Family Medicine

## 2015-10-17 LAB — LIPID PANEL
CHOLESTEROL TOTAL: 258 mg/dL — AB (ref 100–199)
Chol/HDL Ratio: 5.7 ratio units — ABNORMAL HIGH (ref 0.0–4.4)
HDL: 45 mg/dL (ref >39)
LDL Calculated: 181 mg/dL — ABNORMAL HIGH (ref 0–99)
TRIGLYCERIDES: 162 mg/dL — AB (ref 0–149)
VLDL CHOLESTEROL CAL: 32 mg/dL (ref 5–40)

## 2015-10-17 LAB — CBC WITH DIFFERENTIAL/PLATELET
BASOS: 1 %
Basophils Absolute: 0 10*3/uL (ref 0.0–0.2)
EOS (ABSOLUTE): 0.1 10*3/uL (ref 0.0–0.4)
EOS: 2 %
HEMATOCRIT: 33.3 % — AB (ref 34.0–46.6)
HEMOGLOBIN: 10.9 g/dL — AB (ref 11.1–15.9)
Immature Grans (Abs): 0 10*3/uL (ref 0.0–0.1)
Immature Granulocytes: 0 %
LYMPHS ABS: 2.3 10*3/uL (ref 0.7–3.1)
Lymphs: 34 %
MCH: 27.6 pg (ref 26.6–33.0)
MCHC: 32.7 g/dL (ref 31.5–35.7)
MCV: 84 fL (ref 79–97)
MONOCYTES: 8 %
MONOS ABS: 0.6 10*3/uL (ref 0.1–0.9)
Neutrophils Absolute: 3.6 10*3/uL (ref 1.4–7.0)
Neutrophils: 55 %
Platelets: 235 10*3/uL (ref 150–379)
RBC: 3.95 x10E6/uL (ref 3.77–5.28)
RDW: 16.2 % — AB (ref 12.3–15.4)
WBC: 6.7 10*3/uL (ref 3.4–10.8)

## 2015-10-17 LAB — COMPREHENSIVE METABOLIC PANEL
ALBUMIN: 3.8 g/dL (ref 3.5–4.8)
ALK PHOS: 56 IU/L (ref 39–117)
ALT: 5 IU/L (ref 0–32)
AST: 12 IU/L (ref 0–40)
Albumin/Globulin Ratio: 1.2 (ref 1.2–2.2)
BUN / CREAT RATIO: 15 (ref 12–28)
BUN: 24 mg/dL (ref 8–27)
Bilirubin Total: 0.4 mg/dL (ref 0.0–1.2)
CO2: 19 mmol/L (ref 18–29)
CREATININE: 1.63 mg/dL — AB (ref 0.57–1.00)
Calcium: 9.2 mg/dL (ref 8.7–10.3)
Chloride: 105 mmol/L (ref 96–106)
GFR calc Af Amer: 34 mL/min/{1.73_m2} — ABNORMAL LOW (ref >59)
GFR calc non Af Amer: 30 mL/min/{1.73_m2} — ABNORMAL LOW (ref >59)
GLOBULIN, TOTAL: 3.2 g/dL (ref 1.5–4.5)
Glucose: 83 mg/dL (ref 65–99)
Potassium: 4.6 mmol/L (ref 3.5–5.2)
SODIUM: 139 mmol/L (ref 134–144)
Total Protein: 7 g/dL (ref 6.0–8.5)

## 2015-10-17 LAB — TSH: TSH: 2.01 u[IU]/mL (ref 0.450–4.500)

## 2015-10-17 LAB — URIC ACID: Uric Acid: 9.7 mg/dL — ABNORMAL HIGH (ref 2.5–7.1)

## 2015-10-17 NOTE — Telephone Encounter (Signed)
Phone call Discussed with patient no gout symptoms will observe for now with patient's multiple medical problems Also cholesterol markedly elevated Reviewed with patient elevated cholesterol wants to avoid medication for now but discussed very real probability of starting medications especially with claudication symptoms. Will review again after vascular consult.

## 2015-10-31 ENCOUNTER — Other Ambulatory Visit: Payer: Self-pay | Admitting: Gastroenterology

## 2015-10-31 DIAGNOSIS — R1312 Dysphagia, oropharyngeal phase: Secondary | ICD-10-CM

## 2015-11-02 ENCOUNTER — Other Ambulatory Visit: Payer: Self-pay | Admitting: Gastroenterology

## 2015-11-02 DIAGNOSIS — R590 Localized enlarged lymph nodes: Secondary | ICD-10-CM

## 2015-11-07 ENCOUNTER — Other Ambulatory Visit: Payer: Self-pay | Admitting: Family Medicine

## 2015-11-07 ENCOUNTER — Ambulatory Visit
Admission: RE | Admit: 2015-11-07 | Discharge: 2015-11-07 | Disposition: A | Discharge status: Home / Self Care | Payer: Commercial Managed Care - HMO | Source: Ambulatory Visit | Attending: Family Medicine | Admitting: Family Medicine

## 2015-11-07 ENCOUNTER — Telehealth: Payer: Self-pay | Admitting: Family Medicine

## 2015-11-07 DIAGNOSIS — Z13820 Encounter for screening for osteoporosis: Secondary | ICD-10-CM | POA: Diagnosis not present

## 2015-11-07 DIAGNOSIS — M329 Systemic lupus erythematosus, unspecified: Secondary | ICD-10-CM | POA: Insufficient documentation

## 2015-11-07 DIAGNOSIS — Z1231 Encounter for screening mammogram for malignant neoplasm of breast: Secondary | ICD-10-CM

## 2015-11-07 DIAGNOSIS — M810 Age-related osteoporosis without current pathological fracture: Secondary | ICD-10-CM | POA: Insufficient documentation

## 2015-11-07 DIAGNOSIS — Z780 Asymptomatic menopausal state: Secondary | ICD-10-CM | POA: Insufficient documentation

## 2015-11-07 NOTE — Telephone Encounter (Signed)
Phone call Discussed with patient osteoporosis patient has had treatment from [facility] in the past and has completed treatment regimen discussed calcium and vitamin D which patient will start.

## 2015-11-09 ENCOUNTER — Ambulatory Visit
Admission: RE | Admit: 2015-11-09 | Discharge: 2015-11-09 | Disposition: A | Discharge status: Home / Self Care | Payer: Commercial Managed Care - HMO | Source: Ambulatory Visit | Attending: Gastroenterology | Admitting: Gastroenterology

## 2015-11-09 DIAGNOSIS — R590 Localized enlarged lymph nodes: Secondary | ICD-10-CM | POA: Diagnosis present

## 2015-11-13 ENCOUNTER — Ambulatory Visit
Admission: RE | Admit: 2015-11-13 | Discharge: 2015-11-13 | Disposition: A | Discharge status: Home / Self Care | Payer: Commercial Managed Care - HMO | Source: Ambulatory Visit | Attending: Gastroenterology | Admitting: Gastroenterology

## 2015-11-13 DIAGNOSIS — R1312 Dysphagia, oropharyngeal phase: Secondary | ICD-10-CM | POA: Insufficient documentation

## 2015-11-13 NOTE — Therapy (Signed)
[facility] [place], [place], [address] Phone: [phone]   Fax:     Modified Barium Swallow  Patient Details  Name: Marissa Hess MRN: [id number] Date of Birth: 1934/04/09 No Data Recorded  Encounter Date: 11/13/2015      End of Session - 11/13/15 1331    Visit Number 1   Number of Visits 1   Date for SLP Re-Evaluation 11/13/15   SLP Start Time 15   SLP Stop Time  1330   SLP Time Calculation (min) 60 min   Activity Tolerance Patient tolerated treatment well      Past Medical History:  Diagnosis Date  . Allergy   . Arthritis   . Gout   . Osteoporosis     Past Surgical History:  Procedure Laterality Date  . ABDOMINAL HYSTERECTOMY    . EYE SURGERY    . HERNIA REPAIR    . TONSILLECTOMY      There were no vitals filed for this visit.    Subjective: Patient behavior: (alertness, ability to follow instructions, etc.): The patient is alert, able to follow directions and verbalize her swallowing history.  Chief complaint: choking with food and liquid   Objective:  Radiological Procedure: A videoflouroscopic evaluation of oral-preparatory, reflex initiation, and pharyngeal phases of the swallow was performed; as well as a screening of the upper esophageal phase.  I. POSTURE: Upright in MBS chair  II. VIEW: Lateral and A-P  III. COMPENSATORY STRATEGIES: N/A  IV. BOLUSES ADMINISTERED:   Thin Liquid: 2 small sips, 2 larger rapid consecutive sips   Nectar-thick Liquid: 1 moderate sip    Puree: 3 teaspoon presentations   Mechanical Soft: 1/4 graham cracker in applesauce  V. RESULTS OF EVALUATION: A. ORAL PREPARATORY PHASE: (The lips, tongue, and velum are observed for strength and coordination)       **Overall Severity Rating: Within normal limits  B. SWALLOW INITIATION/REFLEX: (The reflex is normal if "triggered" by the time the bolus reached the base of the tongue)  **Overall  Severity Rating: Within normal limits  C. PHARYNGEAL PHASE: (Pharyngeal function is normal if the bolus shows rapid, smooth, and continuous transit through the pharynx and there is no pharyngeal residue after the swallow)  **Overall Severity Rating: Mild; decreased anterior hyoid movement, incomplete epiglottic inversion, decreased tongue base retraction; min pharyngeal residue  D. LARYNGEAL PENETRATION: (Material entering into the laryngeal inlet/vestibule but not aspirated)  None  E. ASPIRATION:  None  F. ESOPHAGEAL PHASE: (Screening of the upper esophagus) In the cervical esophagus there is a finger-like protrusion along the posterior wall during swallow consistent with prominent cricopharyngeus.  Additionally, above this spot, material collects with inconsistent movement out of this collection into the pyriform sinuses (consistent with a diverticulum).    ASSESSMENT: 81 year old woman; with choking on liquids and solids; is presenting with minimal oropharyngeal dysphagia characterized by reduced pharyngeal pressure generation.  Oral control of the bolus including oral hold, rotary mastication, and anterior to posterior transfer are within normal limits. Timing of the pharyngeal swallow is within normal limits.  There is decreased tongue base retraction, decreased hyolaryngeal excursion, and incomplete epiglottic inversion with min pharyngeal residue post swallow.  There was no observed laryngeal penetration or aspiration.  The patient does not appear to be at significant risk for prandial aspiration.   In the cervical esophagus there is a finger-like protrusion along the posterior wall during swallow consistent with prominent cricopharyngeus.  Additionally, above  this spot, material collects with inconsistent movement out of this collection into the pyriform sinuses (consistent with a diverticulum).  This is probably the cause of choking.  She is advised to spit when she feels that choking sensation.   The patient would benefit from referral to ENT to determine canidacy for surgical or endoscopic repair of diverticulum.  PLAN/RECOMMENDATIONS:   A. Diet: Usual diet   B. Swallowing Precautions: cautioned to spit if she feels she is going to choke   C. Recommended consultation to ENT  to determine canidacy for surgical or endoscopic repair of diverticulum.   D. Therapy recommendations: N/A   E. Results and recommendations were discussed with the patient immediately following the study and the final report routed to the referring practitioner and the patient's primary care physician.    Oropharyngeal dysphagia - Plan: DG OP Swallowing Func-Medicare/Speech Path, DG OP Swallowing Func-Medicare/Speech Path      G-Codes - Dec 03, 2015 1332    Functional Assessment Tool Used MBS, clinical judgment   Functional Limitations Swallowing   Swallow Current Status [id number]) At least 1 percent but less than 20 percent impaired, limited or restricted   Swallow Goal Status [id number]) At least 1 percent but less than 20 percent impaired, limited or restricted   Swallow Discharge Status [phone]) At least 1 percent but less than 20 percent impaired, limited or restricted          Problem List Patient Active Problem List   Diagnosis Date Noted  . Gout 10/16/2015  . Dysphagia 10/16/2015  . Claudication of both lower extremities ([place]) 10/16/2015  . Restless leg syndrome 09/26/2014  . Temporal arteritis ([place]) 08/19/2014  . Lupus glomerulonephritis ([place]) 08/19/2014  . RA (rheumatoid arthritis) ([place]) 08/19/2014  . Pernicious anemia 08/19/2014  . Benign hypertension 08/19/2014  . Hyperlipemia 08/19/2014  . Atopic rhinitis 08/19/2014  . 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 Chronic kidney disease, stage III (moderate) 08/19/2014  . Senile osteoporosis 08/19/2014  . Esophageal reflux 08/19/2014   Leroy Sea, MS/CCC- SLP  Lou Miner 2015-12-03, 1:32 PM  [facility] [place], [place], [address] Phone: [phone]   Fax:     Name: Marissa Hess MRN: [id number] Date of Birth: 31-Mar-1934

## 2015-11-16 ENCOUNTER — Telehealth: Payer: Self-pay | Admitting: Family Medicine

## 2015-11-16 DIAGNOSIS — R1310 Dysphagia, unspecified: Secondary | ICD-10-CM

## 2015-11-16 NOTE — Telephone Encounter (Signed)
Pt needs a referral for [facility] because she has a modified barium swallow and cricopharyngeus. Notes are being faxed from GI office.

## 2015-11-16 NOTE — Telephone Encounter (Signed)
Referral placed.

## 2015-11-16 NOTE — Telephone Encounter (Signed)
Routing to provider  

## 2015-11-22 ENCOUNTER — Other Ambulatory Visit: Payer: Self-pay | Admitting: Family Medicine

## 2015-11-22 DIAGNOSIS — R1310 Dysphagia, unspecified: Secondary | ICD-10-CM

## 2015-12-06 ENCOUNTER — Other Ambulatory Visit: Payer: Self-pay | Admitting: Unknown Physician Specialty

## 2015-12-06 DIAGNOSIS — R1310 Dysphagia, unspecified: Secondary | ICD-10-CM

## 2015-12-12 ENCOUNTER — Ambulatory Visit
Admission: RE | Admit: 2015-12-12 | Discharge: 2015-12-12 | Disposition: A | Discharge status: Home / Self Care | Payer: Commercial Managed Care - HMO | Source: Ambulatory Visit | Attending: Unknown Physician Specialty | Admitting: Unknown Physician Specialty

## 2015-12-12 DIAGNOSIS — K225 Diverticulum of esophagus, acquired: Secondary | ICD-10-CM | POA: Diagnosis not present

## 2015-12-12 DIAGNOSIS — R1310 Dysphagia, unspecified: Secondary | ICD-10-CM | POA: Insufficient documentation

## 2015-12-28 ENCOUNTER — Encounter: Payer: Self-pay | Admitting: *Deleted

## 2016-01-01 NOTE — Discharge Instructions (Signed)

## 2016-01-03 ENCOUNTER — Ambulatory Visit: Payer: Commercial Managed Care - HMO | Admitting: Anesthesiology

## 2016-01-03 ENCOUNTER — Encounter
Admission: RE | Disposition: A | Discharge status: Home / Self Care | Payer: Self-pay | Source: Ambulatory Visit | Attending: Ophthalmology

## 2016-01-03 ENCOUNTER — Ambulatory Visit
Admission: RE | Admit: 2016-01-03 | Discharge: 2016-01-03 | Disposition: A | Discharge status: Home / Self Care | Payer: Commercial Managed Care - HMO | Source: Ambulatory Visit | Attending: Ophthalmology | Admitting: Ophthalmology

## 2016-01-03 DIAGNOSIS — I509 Heart failure, unspecified: Secondary | ICD-10-CM | POA: Diagnosis not present

## 2016-01-03 DIAGNOSIS — I110 Hypertensive heart disease with heart failure: Secondary | ICD-10-CM | POA: Diagnosis not present

## 2016-01-03 DIAGNOSIS — H2511 Age-related nuclear cataract, right eye: Secondary | ICD-10-CM | POA: Diagnosis not present

## 2016-01-03 DIAGNOSIS — N059 Unspecified nephritic syndrome with unspecified morphologic changes: Secondary | ICD-10-CM | POA: Insufficient documentation

## 2016-01-03 DIAGNOSIS — M3214 Glomerular disease in systemic lupus erythematosus: Secondary | ICD-10-CM | POA: Diagnosis not present

## 2016-01-03 DIAGNOSIS — I252 Old myocardial infarction: Secondary | ICD-10-CM | POA: Insufficient documentation

## 2016-01-03 DIAGNOSIS — Z87891 Personal history of nicotine dependence: Secondary | ICD-10-CM | POA: Diagnosis not present

## 2016-01-03 DIAGNOSIS — Z79899 Other long term (current) drug therapy: Secondary | ICD-10-CM | POA: Insufficient documentation

## 2016-01-03 DIAGNOSIS — K219 Gastro-esophageal reflux disease without esophagitis: Secondary | ICD-10-CM | POA: Diagnosis not present

## 2016-01-03 HISTORY — PX: CATARACT EXTRACTION W/PHACO: SHX586

## 2016-01-03 HISTORY — DX: Acute myocardial infarction, unspecified: I21.9

## 2016-01-03 HISTORY — DX: Systemic lupus erythematosus, unspecified: M32.9

## 2016-01-03 SURGERY — PHACOEMULSIFICATION, CATARACT, WITH IOL INSERTION
Anesthesia: Monitor Anesthesia Care | Site: Eye | Laterality: Right | Wound class: Clean

## 2016-01-03 MED ORDER — ARMC OPHTHALMIC DILATING DROPS
1.0000 "application " | OPHTHALMIC | Status: DC | PRN
  Administered 2016-01-03 (×3): 1 via OPHTHALMIC

## 2016-01-03 MED ORDER — ACETAMINOPHEN 325 MG PO TABS: 325.0000 mg | ORAL_TABLET | ORAL | Status: DC | PRN

## 2016-01-03 MED ORDER — TIMOLOL MALEATE 0.5 % OP SOLN
OPHTHALMIC | Status: DC | PRN
  Administered 2016-01-03: 1 [drp] via OPHTHALMIC

## 2016-01-03 MED ORDER — MIDAZOLAM HCL 2 MG/2ML IJ SOLN
INTRAMUSCULAR | Status: DC | PRN
  Administered 2016-01-03: 1.5 mg via INTRAVENOUS

## 2016-01-03 MED ORDER — ACETAMINOPHEN 160 MG/5ML PO SOLN: 325.0000 mg | ORAL | Status: DC | PRN

## 2016-01-03 MED ORDER — LACTATED RINGERS IV SOLN: INTRAVENOUS | Status: DC

## 2016-01-03 MED ORDER — BRIMONIDINE TARTRATE 0.2 % OP SOLN
OPHTHALMIC | Status: DC | PRN
  Administered 2016-01-03: 1 [drp] via OPHTHALMIC

## 2016-01-03 MED ORDER — FENTANYL CITRATE (PF) 100 MCG/2ML IJ SOLN
INTRAMUSCULAR | Status: DC | PRN
  Administered 2016-01-03: 50 ug via INTRAVENOUS

## 2016-01-03 MED ORDER — MOXIFLOXACIN HCL 0.5 % OP SOLN
1.0000 [drp] | OPHTHALMIC | Status: DC | PRN
  Administered 2016-01-03 (×3): 1 [drp] via OPHTHALMIC

## 2016-01-03 MED ORDER — NA HYALUR & NA CHOND-NA HYALUR 0.4-0.35 ML IO KIT
PACK | INTRAOCULAR | Status: DC | PRN
Start: 2016-01-03 — End: 2016-01-03
  Administered 2016-01-03: 1 mL via INTRAOCULAR

## 2016-01-03 MED ORDER — MOXIFLOXACIN HCL 0.5 % OP SOLN
OPHTHALMIC | Status: DC | PRN
  Administered 2016-01-03: 0.5 mL via OPHTHALMIC

## 2016-01-03 MED ORDER — LIDOCAINE HCL (PF) 4 % IJ SOLN
INTRAOCULAR | Status: DC | PRN
  Administered 2016-01-03: 1 mL via OPHTHALMIC

## 2016-01-03 MED ORDER — EPINEPHRINE PF 1 MG/ML IJ SOLN
INTRAMUSCULAR | Status: DC | PRN
  Administered 2016-01-03: 82 mL via OPHTHALMIC

## 2016-01-03 SURGICAL SUPPLY — 25 items
CANNULA ANT/CHMB 27GA (MISCELLANEOUS) ×3 IMPLANT
CARTRIDGE ABBOTT (MISCELLANEOUS) IMPLANT
GLOVE SURG LX 7.5 STRW (GLOVE) ×2
GLOVE SURG LX STRL 7.5 STRW (GLOVE) ×1 IMPLANT
GLOVE SURG TRIUMPH 8.0 PF LTX (GLOVE) ×3 IMPLANT
GOWN STRL REUS W/ TWL LRG LVL3 (GOWN DISPOSABLE) ×2 IMPLANT
GOWN STRL REUS W/TWL LRG LVL3 (GOWN DISPOSABLE) ×4
LENS IOL ACRYSOF IQ 21.0 (Intraocular Lens) ×3 IMPLANT
MARKER SKIN DUAL TIP RULER LAB (MISCELLANEOUS) ×3 IMPLANT
NDL RETROBULBAR .5 NSTRL (NEEDLE) IMPLANT
NEEDLE FILTER BLUNT 18X 1/2SAF (NEEDLE) ×2
NEEDLE FILTER BLUNT 18X1 1/2 (NEEDLE) ×1 IMPLANT
PACK CATARACT BRASINGTON (MISCELLANEOUS) ×3 IMPLANT
PACK EYE AFTER SURG (MISCELLANEOUS) ×3 IMPLANT
PACK OPTHALMIC (MISCELLANEOUS) ×3 IMPLANT
RING MALYGIN 7.0 (MISCELLANEOUS) IMPLANT
SUT ETHILON 10-0 CS-B-6CS-B-6 (SUTURE)
SUT VICRYL  9 0 (SUTURE)
SUT VICRYL 9 0 (SUTURE) IMPLANT
SUTURE EHLN 10-0 CS-B-6CS-B-6 (SUTURE) IMPLANT
SYR 3ML LL SCALE MARK (SYRINGE) ×3 IMPLANT
SYR 5ML LL (SYRINGE) ×3 IMPLANT
SYR TB 1ML LUER SLIP (SYRINGE) ×3 IMPLANT
WATER STERILE IRR 250ML POUR (IV SOLUTION) ×3 IMPLANT
WIPE NON LINTING 3.25X3.25 (MISCELLANEOUS) ×3 IMPLANT

## 2016-01-03 NOTE — Transfer of Care (Signed)
Immediate Anesthesia Transfer of Care Note  Patient: Marissa Hess  Procedure(s) Performed: Procedure(s): CATARACT EXTRACTION PHACO AND INTRAOCULAR LENS PLACEMENT (IOC) (Right)  Patient Location: PACU  Anesthesia Type: MAC  Level of Consciousness: awake, alert  and patient cooperative  Airway and Oxygen Therapy: Patient Spontanous Breathing and Patient connected to supplemental oxygen  Post-op Assessment: Post-op Vital signs reviewed, Patient's Cardiovascular Status Stable, Respiratory Function Stable, Patent Airway and No signs of Nausea or vomiting  Post-op Vital Signs: Reviewed and stable  Complications: No apparent anesthesia complications

## 2016-01-03 NOTE — Anesthesia Procedure Notes (Signed)
Procedure Name: MAC Performed by: Dereon Williamsen Pre-anesthesia Checklist: Patient identified, Emergency Drugs available, Suction available, Timeout performed and Patient being monitored Patient Re-evaluated:Patient Re-evaluated prior to inductionOxygen Delivery Method: Nasal cannula Placement Confirmation: positive ETCO2       

## 2016-01-03 NOTE — H&P (Signed)
The History and Physical notes are on paper, have been signed, and are to be scanned. The patient remains stable and unchanged from the H&P.   Previous H&P reviewed, patient examined, and there are no changes.  Marissa Hess 01/03/2016 9:43 AM

## 2016-01-03 NOTE — Op Note (Signed)
LOCATION:  [facility]   PREOPERATIVE DIAGNOSIS:    Nuclear sclerotic cataract right eye. H25.11   POSTOPERATIVE DIAGNOSIS:  Nuclear sclerotic cataract right eye.     PROCEDURE:  Phacoemusification with posterior chamber intraocular lens placement of the right eye   LENS:   Implant Name Type Inv. Item Serial No. Manufacturer Lot No. LRB No. Used  LENS IOL ACRYSOF IQ 21.0 - [id number] Intraocular Lens LENS IOL ACRYSOF IQ 21.0 [id number] [employer]   Right 1        ULTRASOUND TIME: 19 % of 1 minutes, 37 seconds.  CDE 18.4   SURGEON:  Wyonia Hough, MD   ANESTHESIA:  Topical with tetracaine drops and 2% Xylocaine jelly, augmented with 1% preservative-free intracameral lidocaine.    COMPLICATIONS:  None.   DESCRIPTION OF PROCEDURE:  The patient was identified in the holding room and transported to the operating room and placed in the supine position under the operating microscope.  The right eye was identified as the operative eye and it was prepped and draped in the usual sterile ophthalmic fashion.   A 1 millimeter clear-corneal paracentesis was made at the 12:00 position.  0.5 ml of preservative-free 1% lidocaine was injected into the anterior chamber. The anterior chamber was filled with Viscoat viscoelastic.  A 2.4 millimeter keratome was used to make a near-clear corneal incision at the 9:00 position.  A curvilinear capsulorrhexis was made with a cystotome and capsulorrhexis forceps.  Balanced salt solution was used to hydrodissect and hydrodelineate the nucleus.   Phacoemulsification was then used in stop and chop fashion to remove the lens nucleus and epinucleus.  The remaining cortex was then removed using the irrigation and aspiration handpiece. Provisc was then placed into the capsular bag to distend it for lens placement.  A lens was then injected into the capsular bag.  The remaining viscoelastic was aspirated.   Wounds were hydrated with balanced salt solution.   The anterior chamber was inflated to a physiologic pressure with balanced salt solution.  No wound leaks were noted. Vigamox 0.2 ml of a 1mg  per ml solution was injected into the anterior chamber for a dose of 0.2 mg of intracameral antibiotic at the completion of the case.   Timolol and Brimonidine drops were applied to the eye.  The patient was taken to the recovery room in stable condition without complications of anesthesia or surgery.   Marissa Hess 01/03/2016, 10:12 AM

## 2016-01-03 NOTE — Anesthesia Preprocedure Evaluation (Signed)
Anesthesia Evaluation  Patient identified by MRN, date of birth, ID band Patient awake    Reviewed: Allergy & Precautions, H&P , NPO status , Patient's Chart, lab work & pertinent test results, reviewed documented beta blocker date and time   Airway Mallampati: II  TM Distance: >3 FB Neck ROM: full    Dental  (+) Edentulous Lower, Upper Dentures   Pulmonary former smoker,    Pulmonary exam normal breath sounds clear to auscultation       Cardiovascular Exercise Tolerance: Good hypertension, + Past MI   Rhythm:regular Rate:Normal     Neuro/Psych negative neurological ROS  negative psych ROS   GI/Hepatic GERD  ,  Endo/Other  negative endocrine ROS  Renal/GU Renal diseaseLupus glomerulonephritis  negative genitourinary   Musculoskeletal   Abdominal   Peds  Hematology negative hematology ROS (+)   Anesthesia Other Findings   Reproductive/Obstetrics negative OB ROS                             Anesthesia Physical Anesthesia Plan  ASA: III  Anesthesia Plan: MAC   Post-op Pain Management:    Induction:   Airway Management Planned:   Additional Equipment:   Intra-op Plan:   Post-operative Plan:   Informed Consent: I have reviewed the patients History and Physical, chart, labs and discussed the procedure including the risks, benefits and alternatives for the proposed anesthesia with the patient or authorized representative who has indicated his/her understanding and acceptance.   Dental Advisory Given  Plan Discussed with: CRNA and Anesthesiologist  Anesthesia Plan Comments:         Anesthesia Quick Evaluation

## 2016-01-03 NOTE — Anesthesia Postprocedure Evaluation (Signed)
Anesthesia Post Note  Patient: Marissa Hess  Procedure(s) Performed: Procedure(s) (LRB): CATARACT EXTRACTION PHACO AND INTRAOCULAR LENS PLACEMENT (IOC) (Right)  Patient location during evaluation: PACU Anesthesia Type: MAC Level of consciousness: awake and alert Pain management: pain level controlled Vital Signs Assessment: post-procedure vital signs reviewed and stable Respiratory status: spontaneous breathing, nonlabored ventilation and respiratory function stable Cardiovascular status: stable and blood pressure returned to baseline Anesthetic complications: no    Trecia Rogers

## 2016-01-04 ENCOUNTER — Encounter: Payer: Self-pay | Admitting: Ophthalmology

## 2016-03-19 ENCOUNTER — Other Ambulatory Visit: Payer: Self-pay | Admitting: Family Medicine

## 2016-04-17 ENCOUNTER — Encounter: Payer: Self-pay | Admitting: Family Medicine

## 2016-04-17 ENCOUNTER — Ambulatory Visit (INDEPENDENT_AMBULATORY_CARE_PROVIDER_SITE_OTHER): Payer: Medicare Other | Admitting: Family Medicine

## 2016-04-17 VITALS — BP 138/85 | HR 65 | Temp 97.8°F | Ht 63.0 in | Wt 150.0 lb

## 2016-04-17 DIAGNOSIS — I10 Essential (primary) hypertension: Secondary | ICD-10-CM

## 2016-04-17 DIAGNOSIS — N183 Chronic kidney disease, stage 3 unspecified: Secondary | ICD-10-CM

## 2016-04-17 DIAGNOSIS — E785 Hyperlipidemia, unspecified: Secondary | ICD-10-CM

## 2016-04-17 DIAGNOSIS — S161XXA Strain of muscle, fascia and tendon at neck level, initial encounter: Secondary | ICD-10-CM | POA: Diagnosis not present

## 2016-04-17 MED ORDER — FUROSEMIDE 20 MG PO TABS: 20.0000 mg | ORAL_TABLET | Freq: Every morning | ORAL | 1 refills | Status: DC

## 2016-04-17 NOTE — Progress Notes (Signed)
BP 138/85 (BP Location: Left Arm)   Pulse 65   Temp 97.8 F (36.6 C) (Oral)   Ht 5\' 3"  (1.6 m)   Wt 150 lb (68 kg)   SpO2 99%   BMI 26.57 kg/m    Subjective:    Patient ID: Marissa Hess, female    DOB: 09-03-1934, 82 y.o.   MRN: [id number]  HPI: Marissa Hess is a 82 y.o. female  Chief Complaint  Patient presents with  . Follow-up  . Headache    waking with them x couple weeks.    Patient with complaints of left posterior neck and low her left ear discomfort which is her headache type symptoms. This been ongoing about 3-4 weeks no known change in posture pillows etc. happens when she wakes up from sleep there is no associated other symptoms or visual changes from her temporal arteritis or lupus. Takes 2 Tylenol and this seems to go away and not any further issues for the day. Reviewed medications doing well and blood pressure control. Relevant past medical, surgical, family and social history reviewed and updated as indicated. Interim medical history since our last visit reviewed. Allergies and medications reviewed and updated.  Review of Systems  Constitutional: Negative.   Respiratory: Negative.   Cardiovascular: Negative.     Per HPI unless specifically indicated above     Objective:    BP 138/85 (BP Location: Left Arm)   Pulse 65   Temp 97.8 F (36.6 C) (Oral)   Ht 5\' 3"  (1.6 m)   Wt 150 lb (68 kg)   SpO2 99%   BMI 26.57 kg/m   Wt Readings from Last 3 Encounters:  04/17/16 150 lb (68 kg)  01/03/16 152 lb (68.9 kg)  10/16/15 150 lb (68 kg)    Physical Exam  Constitutional: She is oriented to person, place, and time. She appears well-developed and well-nourished. No distress.  HENT:  Head: Normocephalic and atraumatic.  Right Ear: Hearing and external ear normal.  Left Ear: Hearing and external ear normal.  Nose: Nose normal.  Mouth/Throat: Oropharynx is clear and moist. No oropharyngeal exudate.  Mouth clear wears dentures  Eyes: Conjunctivae  and lids are normal. Pupils are equal, round, and reactive to light. Right eye exhibits no discharge. Left eye exhibits no discharge. No scleral icterus.  Neck: Normal range of motion. Neck supple. No JVD present. No tracheal deviation present. No thyromegaly present.  Cardiovascular: Normal rate, regular rhythm and normal heart sounds.   Pulmonary/Chest: Effort normal and breath sounds normal. No stridor. No respiratory distress.  Musculoskeletal: Normal range of motion.  Lymphadenopathy:    She has no cervical adenopathy.  Neurological: She is alert and oriented to person, place, and time.  Skin: Skin is intact. No rash noted.  Psychiatric: She has a normal mood and affect. Her speech is normal and behavior is normal. Judgment and thought content normal. Cognition and memory are normal.    Results for orders placed or performed in visit on 10/16/15  Microscopic Examination  Result Value Ref Range   WBC, UA 0-5 0 - 5 /hpf   RBC, UA 0-2 0 - 2 /hpf   Epithelial Cells (non renal) 0-10 0 - 10 /hpf   Casts Present (A) None seen /lpf   Cast Type Hyaline casts N/A   Mucus, UA Present Not Estab.   Bacteria, UA Few None seen/Few  Comprehensive metabolic panel  Result Value Ref Range   Glucose 83 65 - 99  mg/dL   BUN 24 8 - 27 mg/dL   Creatinine, Ser 1.63 (H) 0.57 - 1.00 mg/dL   GFR calc non Af Amer 30 (L) >59 mL/min/1.73   GFR calc Af Amer 34 (L) >59 mL/min/1.73   BUN/Creatinine Ratio 15 12 - 28   Sodium 139 134 - 144 mmol/L   Potassium 4.6 3.5 - 5.2 mmol/L   Chloride 105 96 - 106 mmol/L   CO2 19 18 - 29 mmol/L   Calcium 9.2 8.7 - 10.3 mg/dL   Total Protein 7.0 6.0 - 8.5 g/dL   Albumin 3.8 3.5 - 4.8 g/dL   Globulin, Total 3.2 1.5 - 4.5 g/dL   Albumin/Globulin Ratio 1.2 1.2 - 2.2   Bilirubin Total 0.4 0.0 - 1.2 mg/dL   Alkaline Phosphatase 56 39 - 117 IU/L   AST 12 0 - 40 IU/L   ALT 5 0 - 32 IU/L  Lipid panel  Result Value Ref Range   Cholesterol, Total 258 (H) 100 - 199 mg/dL    Triglycerides 162 (H) 0 - 149 mg/dL   HDL 45 >39 mg/dL   VLDL Cholesterol Cal 32 5 - 40 mg/dL   LDL Calculated 181 (H) 0 - 99 mg/dL   Chol/HDL Ratio 5.7 (H) 0.0 - 4.4 ratio units  CBC with Differential/Platelet  Result Value Ref Range   WBC 6.7 3.4 - 10.8 x10E3/uL   RBC 3.95 3.77 - 5.28 x10E6/uL   Hemoglobin 10.9 (L) 11.1 - 15.9 g/dL   Hematocrit 33.3 (L) 34.0 - 46.6 %   MCV 84 79 - 97 fL   MCH 27.6 26.6 - 33.0 pg   MCHC 32.7 31.5 - 35.7 g/dL   RDW 16.2 (H) 12.3 - 15.4 %   Platelets 235 150 - 379 x10E3/uL   Neutrophils 55 %   Lymphs 34 %   Monocytes 8 %   Eos 2 %   Basos 1 %   Neutrophils Absolute 3.6 1.4 - 7.0 x10E3/uL   Lymphocytes Absolute 2.3 0.7 - 3.1 x10E3/uL   Monocytes Absolute 0.6 0.1 - 0.9 x10E3/uL   EOS (ABSOLUTE) 0.1 0.0 - 0.4 x10E3/uL   Basophils Absolute 0.0 0.0 - 0.2 x10E3/uL   Immature Granulocytes 0 %   Immature Grans (Abs) 0.0 0.0 - 0.1 x10E3/uL  TSH  Result Value Ref Range   TSH 2.010 0.450 - 4.500 uIU/mL  Urinalysis, Routine w reflex microscopic (not at [facility])  Result Value Ref Range   Specific Gravity, UA <1.005 (L) 1.005 - 1.030   pH, UA 5.0 5.0 - 7.5   Color, UA Yellow Yellow   Appearance Ur Clear Clear   Leukocytes, UA Trace (A) Negative   Protein, UA 2+ (A) Negative/Trace   Glucose, UA Negative Negative   Ketones, UA Negative Negative   RBC, UA Negative Negative   Bilirubin, UA Negative Negative   Urobilinogen, Ur 0.2 0.2 - 1.0 mg/dL   Nitrite, UA Negative Negative   Microscopic Examination See below:   Uric acid  Result Value Ref Range   Uric Acid 9.7 (H) 2.5 - 7.1 mg/dL      Assessment & Plan:   Problem List Items Addressed This Visit      Cardiovascular and Mediastinum   Benign hypertension - Primary (Chronic)    The current medical regimen is effective;  continue present plan and medications.       Relevant Orders   Basic metabolic panel     Genitourinary   Chronic kidney disease, stage III (moderate) (  Chronic)   Relevant  Orders   Basic metabolic panel     Other   Hyperlipemia (Chronic)   Relevant Orders   Basic metabolic panel    Other Visit Diagnoses    Strain of neck muscle, initial encounter       Discuss continuing Tylenol as needed changing positions ,: pillows and observing symptoms no evidence of infection in ears mouth throat or neck       Follow up plan: Return for As scheduled.

## 2016-04-17 NOTE — Assessment & Plan Note (Signed)
The current medical regimen is effective;  continue present plan and medications.  

## 2016-04-18 ENCOUNTER — Encounter: Payer: Self-pay | Admitting: Family Medicine

## 2016-04-18 LAB — BASIC METABOLIC PANEL
BUN/Creatinine Ratio: 14 (ref 12–28)
BUN: 23 mg/dL (ref 8–27)
CALCIUM: 8.7 mg/dL (ref 8.7–10.3)
CHLORIDE: 108 mmol/L — AB (ref 96–106)
CO2: 19 mmol/L (ref 18–29)
Creatinine, Ser: 1.7 mg/dL — ABNORMAL HIGH (ref 0.57–1.00)
GFR calc Af Amer: 33 mL/min/{1.73_m2} — ABNORMAL LOW (ref >59)
GFR, EST NON AFRICAN AMERICAN: 28 mL/min/{1.73_m2} — AB (ref >59)
GLUCOSE: 65 mg/dL (ref 65–99)
POTASSIUM: 4.2 mmol/L (ref 3.5–5.2)
SODIUM: 142 mmol/L (ref 134–144)

## 2016-06-11 ENCOUNTER — Telehealth: Payer: Self-pay

## 2016-06-11 NOTE — Telephone Encounter (Signed)
Call pt 

## 2016-06-11 NOTE — Telephone Encounter (Signed)
Marissa Hess had a once a year health screening done with a NP thru [facility] and she wanted to let us know about some abnormal findings. On her PAD screening, her right foot was 0.26 which is severe and her left foot was 0.33 which is significant. She is asymptomatic.  They will send [place] a full detailed report.

## 2016-07-18 DIAGNOSIS — I7300 Raynaud's syndrome without gangrene: Secondary | ICD-10-CM | POA: Diagnosis not present

## 2016-07-18 DIAGNOSIS — M3214 Glomerular disease in systemic lupus erythematosus: Secondary | ICD-10-CM | POA: Diagnosis not present

## 2016-07-18 DIAGNOSIS — M810 Age-related osteoporosis without current pathological fracture: Secondary | ICD-10-CM | POA: Diagnosis not present

## 2016-07-18 DIAGNOSIS — M1A4710 Other secondary chronic gout, right ankle and foot, without tophus (tophi): Secondary | ICD-10-CM | POA: Diagnosis not present

## 2016-07-18 DIAGNOSIS — N183 Chronic kidney disease, stage 3 (moderate): Secondary | ICD-10-CM | POA: Diagnosis not present

## 2016-07-19 DIAGNOSIS — M3214 Glomerular disease in systemic lupus erythematosus: Secondary | ICD-10-CM | POA: Diagnosis not present

## 2016-07-19 DIAGNOSIS — M1A4710 Other secondary chronic gout, right ankle and foot, without tophus (tophi): Secondary | ICD-10-CM | POA: Diagnosis not present

## 2016-08-10 ENCOUNTER — Telehealth: Payer: Self-pay | Admitting: Family Medicine

## 2016-08-10 NOTE — Telephone Encounter (Signed)
Called pt to schedule Annual Wellness Visit with Nurse Health Advisor move off of Dr. Rance Muir schedule and have pt come back in for CPE in June - knb

## 2016-08-14 DIAGNOSIS — Z79899 Other long term (current) drug therapy: Secondary | ICD-10-CM | POA: Diagnosis not present

## 2016-08-14 DIAGNOSIS — M3219 Other organ or system involvement in systemic lupus erythematosus: Secondary | ICD-10-CM | POA: Diagnosis not present

## 2016-08-22 DIAGNOSIS — Z79899 Other long term (current) drug therapy: Secondary | ICD-10-CM | POA: Diagnosis not present

## 2016-08-22 DIAGNOSIS — M3219 Other organ or system involvement in systemic lupus erythematosus: Secondary | ICD-10-CM | POA: Diagnosis not present

## 2016-08-27 ENCOUNTER — Ambulatory Visit: Payer: Medicare Other | Admitting: Family Medicine

## 2016-08-29 ENCOUNTER — Ambulatory Visit (INDEPENDENT_AMBULATORY_CARE_PROVIDER_SITE_OTHER): Payer: Medicare Other

## 2016-08-29 VITALS — BP 138/68 | HR 68 | Temp 98.0°F | Resp 16 | Ht 63.0 in | Wt 146.4 lb

## 2016-08-29 DIAGNOSIS — Z0000 Encounter for general adult medical examination without abnormal findings: Secondary | ICD-10-CM

## 2016-08-29 NOTE — Patient Instructions (Addendum)
Marissa Hess , Thank you for taking time to come for your Medicare Wellness Visit. I appreciate your ongoing commitment to your health goals. Please review the following plan we discussed and let me know if I can assist you in the future.   Screening recommendations/referrals: Colonoscopy: completed 01/2006 Mammogram: completed 11/07/2015 Bone Density: completed 11/07/2015 Recommended yearly ophthalmology/optometry visit for glaucoma screening and checkup Recommended yearly dental visit for hygiene and checkup  Vaccinations: Influenza vaccine: up to date Pneumococcal vaccine: completed series Tdap vaccine: up to date Shingles vaccine: up to date   Advanced directives: Advance directive discussed with you today. I have provided a copy for you to complete at home and have notarized. Once this is complete please bring a copy in to our office so we can scan it into your chart.  Conditions/risks identified: Recommend eating 3 healthy meals a day.   Next appointment: Follow up with Dr. Jeananne Rama on 09/25/2016 at 9 am. Follow up in one year for your annual wellness exam    Preventive Care 65 Years and Older, Female Preventive care refers to lifestyle choices and visits with your health care provider that can promote health and wellness. What does preventive care include?  A yearly physical exam. This is also called an annual well check.  Dental exams once or twice a year.  Routine eye exams. Ask your health care provider how often you should have your eyes checked.  Personal lifestyle choices, including:  Daily care of your teeth and gums.  Regular physical activity.  Eating a healthy diet.  Avoiding tobacco and drug use.  Limiting alcohol use.  Practicing safe sex.  Taking low-dose aspirin every day.  Taking vitamin and mineral supplements as recommended by your health care provider. What happens during an annual well check? The services and screenings done by your health care  provider during your annual well check will depend on your age, overall health, lifestyle risk factors, and family history of disease. Counseling  Your health care provider may ask you questions about your:  Alcohol use.  Tobacco use.  Drug use.  Emotional well-being.  Home and relationship well-being.  Sexual activity.  Eating habits.  History of falls.  Memory and ability to understand (cognition).  Work and work Statistician.  Reproductive health. Screening  You may have the following tests or measurements:  Height, weight, and BMI.  Blood pressure.  Lipid and cholesterol levels. These may be checked every 5 years, or more frequently if you are over 90 years old.  Skin check.  Lung cancer screening. You may have this screening every year starting at age 69 if you have a 30-pack-year history of smoking and currently smoke or have quit within the past 15 years.  Fecal occult blood test (FOBT) of the stool. You may have this test every year starting at age 11.  Flexible sigmoidoscopy or colonoscopy. You may have a sigmoidoscopy every 5 years or a colonoscopy every 10 years starting at age 62.  Hepatitis C blood test.  Hepatitis B blood test.  Sexually transmitted disease (STD) testing.  Diabetes screening. This is done by checking your blood sugar (glucose) after you have not eaten for a while (fasting). You may have this done every 1-3 years.  Bone density scan. This is done to screen for osteoporosis. You may have this done starting at age 52.  Mammogram. This may be done every 1-2 years. Talk to your health care provider about how often you should have regular mammograms.  Talk with your health care provider about your test results, treatment options, and if necessary, the need for more tests. Vaccines  Your health care provider may recommend certain vaccines, such as:  Influenza vaccine. This is recommended every year.  Tetanus, diphtheria, and acellular  pertussis (Tdap, Td) vaccine. You may need a Td booster every 10 years.  Zoster vaccine. You may need this after age 55.  Pneumococcal 13-valent conjugate (PCV13) vaccine. One dose is recommended after age 50.  Pneumococcal polysaccharide (PPSV23) vaccine. One dose is recommended after age 13. Talk to your health care provider about which screenings and vaccines you need and how often you need them. This information is not intended to replace advice given to you by your health care provider. Make sure you discuss any questions you have with your health care provider. Document Released: 03/31/2015 Document Revised: 11/22/2015 Document Reviewed: 01/03/2015 Elsevier Interactive Patient Education  2017 [employer] Prevention in the Home Falls can cause injuries. They can happen to people of all ages. There are many things you can do to make your home safe and to help prevent falls. What can I do on the outside of my home?  Regularly fix the edges of walkways and driveways and fix any cracks.  Remove anything that might make you trip as you walk through a door, such as a raised step or threshold.  Trim any bushes or trees on the path to your home.  Use bright outdoor lighting.  Clear any walking paths of anything that might make someone trip, such as rocks or tools.  Regularly check to see if handrails are loose or broken. Make sure that both sides of any steps have handrails.  Any raised decks and porches should have guardrails on the edges.  Have any leaves, snow, or ice cleared regularly.  Use sand or salt on walking paths during winter.  Clean up any spills in your garage right away. This includes oil or grease spills. What can I do in the bathroom?  Use night lights.  Install grab bars by the toilet and in the tub and shower. Do not use towel bars as grab bars.  Use non-skid mats or decals in the tub or shower.  If you need to sit down in the shower, use a plastic,  non-slip stool.  Keep the floor dry. Clean up any water that spills on the floor as soon as it happens.  Remove soap buildup in the tub or shower regularly.  Attach bath mats securely with double-sided non-slip rug tape.  Do not have throw rugs and other things on the floor that can make you trip. What can I do in the bedroom?  Use night lights.  Make sure that you have a light by your bed that is easy to reach.  Do not use any sheets or blankets that are too big for your bed. They should not hang down onto the floor.  Have a firm chair that has side arms. You can use this for support while you get dressed.  Do not have throw rugs and other things on the floor that can make you trip. What can I do in the kitchen?  Clean up any spills right away.  Avoid walking on wet floors.  Keep items that you use a lot in easy-to-reach places.  If you need to reach something above you, use a strong step stool that has a grab bar.  Keep electrical cords out of the way.  Do not use floor polish or wax that makes floors slippery. If you must use wax, use non-skid floor wax.  Do not have throw rugs and other things on the floor that can make you trip. What can I do with my stairs?  Do not leave any items on the stairs.  Make sure that there are handrails on both sides of the stairs and use them. Fix handrails that are broken or loose. Make sure that handrails are as long as the stairways.  Check any carpeting to make sure that it is firmly attached to the stairs. Fix any carpet that is loose or worn.  Avoid having throw rugs at the top or bottom of the stairs. If you do have throw rugs, attach them to the floor with carpet tape.  Make sure that you have a light switch at the top of the stairs and the bottom of the stairs. If you do not have them, ask someone to add them for you. What else can I do to help prevent falls?  Wear shoes that:  Do not have high heels.  Have rubber  bottoms.  Are comfortable and fit you well.  Are closed at the toe. Do not wear sandals.  If you use a stepladder:  Make sure that it is fully opened. Do not climb a closed stepladder.  Make sure that both sides of the stepladder are locked into place.  Ask someone to hold it for you, if possible.  Clearly mark and make sure that you can see:  Any grab bars or handrails.  First and last steps.  Where the edge of each step is.  Use tools that help you move around (mobility aids) if they are needed. These include:  Canes.  Walkers.  Scooters.  Crutches.  Turn on the lights when you go into a dark area. Replace any light bulbs as soon as they burn out.  Set up your furniture so you have a clear path. Avoid moving your furniture around.  If any of your floors are uneven, fix them.  If there are any pets around you, be aware of where they are.  Review your medicines with your doctor. Some medicines can make you feel dizzy. This can increase your chance of falling. Ask your doctor what other things that you can do to help prevent falls. This information is not intended to replace advice given to you by your health care provider. Make sure you discuss any questions you have with your health care provider. Document Released: 12/29/2008 Document Revised: 08/10/2015 Document Reviewed: 04/08/2014 Elsevier Interactive Patient Education  2017 [employer].

## 2016-08-29 NOTE — Progress Notes (Signed)
Subjective:   Marissa Hess is a 82 y.o. female who presents for Medicare Annual (Subsequent) preventive examination.  Review of Systems:   Cardiac Risk Factors include: advanced age (>20men, >76 women);dyslipidemia;hypertension     Objective:     Vitals: BP 138/68 (BP Location: Left Arm, Patient Position: Sitting)   Pulse 68   Temp 98 F (36.7 C)   Resp 16   Ht 5\' 3"  (1.6 m)   Wt 146 lb 6.4 oz (66.4 kg)   BMI 25.93 kg/m   Body mass index is 25.93 kg/m.   Tobacco History  Smoking Status  . Former Smoker  . Types: Cigarettes  . Start date: 09/11/1984  . Quit date: 09/26/1994  Smokeless Tobacco  . Never Used     Counseling given: Not Answered   Past Medical History:  Diagnosis Date  . Allergy   . Arthritis   . GERD (gastroesophageal reflux disease)   . Lupus 2002  . Myocardial infarction ([place]) 2007  . Osteoporosis    Past Surgical History:  Procedure Laterality Date  . ABDOMINAL HYSTERECTOMY    . CATARACT EXTRACTION W/PHACO Right 01/03/2016   Procedure: CATARACT EXTRACTION PHACO AND INTRAOCULAR LENS PLACEMENT ([place]);  Surgeon: Leandrew Koyanagi, MD;  Location: [facility];  Service: Ophthalmology;  Laterality: Right;  . EYE SURGERY     cataract  . HERNIA REPAIR    . TEMPORAL ARTERY BIOPSY / LIGATION    . TONSILLECTOMY     Family History  Problem Relation Age of Onset  . Stomach cancer Mother   . Arthritis Father   . Breast cancer Neg Hx    History  Sexual Activity  . Sexual activity: Not on file    Outpatient Encounter Prescriptions as of 08/29/2016  Medication Sig  . aspirin EC 81 MG tablet Take 81 mg by mouth daily.  . carvedilol (COREG) 25 MG tablet Take 1 tablet (25 mg total) by mouth 2 (two) times daily with a meal.  . folic acid (FOLVITE) 1 MG tablet Take 1 tablet (1 mg total) by mouth daily.  . furosemide (LASIX) 20 MG tablet Take 1 tablet (20 mg total) by mouth every morning.  . hydroxychloroquine (PLAQUENIL) 200 MG tablet  Take by mouth daily.  Marland Kitchen omeprazole (PRILOSEC) 20 MG capsule Take 1 capsule (20 mg total) by mouth daily.  . clonazePAM (KLONOPIN) 0.5 MG tablet Take 1 tablet (0.5 mg total) by mouth daily as needed for anxiety. For legs (Patient not taking: Reported on 08/29/2016)   No facility-administered encounter medications on file as of 08/29/2016.     Activities of Daily Living In your present state of health, do you have any difficulty performing the following activities: 08/29/2016 01/03/2016  Hearing? N N  Vision? N N  Difficulty concentrating or making decisions? N N  Walking or climbing stairs? N N  Dressing or bathing? N N  Doing errands, shopping? N -  Preparing Food and eating ? N -  Using the Toilet? N -  In the past six months, have you accidently leaked urine? N -  Do you have problems with loss of bowel control? N -  Managing your Medications? N -  Managing your Finances? N -  Housekeeping or managing your Housekeeping? N -  Some recent data might be hidden    Patient Care Team: Guadalupe Maple, MD as PCP - General (Family Medicine) Milus Mallick, [name], MD as Referring Physician (Ophthalmology)  Assessment:     Exercise Activities and Dietary recommendations Current Exercise Habits: The patient does not participate in regular exercise at present  Goals    . Have 3 meals a day          Recommend eating 3 healthy meals a day.       Fall Risk Fall Risk  08/29/2016 04/17/2016 10/16/2015 03/06/2015  Falls in the past year? No No No No   Depression Screen PHQ 2/9 Scores 08/29/2016 10/16/2015 03/06/2015  PHQ - 2 Score 0 0 1     Cognitive Function     6CIT Screen 08/29/2016  What Year? 0 points  What month? 0 points  What time? 0 points  Count back from 20 0 points  Months in reverse 0 points  Repeat phrase 2 points  Total Score 2    Immunization History  Administered Date(s) Administered  . Influenza,inj,Quad PF,36+  Mos 03/06/2015  . Influenza-Unspecified 12/17/2015  . Pneumococcal-Unspecified 07/28/2008  . Tdap 05/26/2012   Screening Tests Health Maintenance  Topic Date Due  . PNA vac Low Risk Adult (2 of 2 - PCV13) 10/17/2016 (Originally 07/28/2009)  . INFLUENZA VACCINE  10/16/2016  . TETANUS/TDAP  05/27/2022  . DEXA SCAN  Completed      Plan:    I have personally reviewed and addressed the Medicare Annual Wellness questionnaire and have noted the following in the patient's chart:  A. Medical and social history B. Use of alcohol, tobacco or illicit drugs  C. Current medications and supplements D. Functional ability and status E.  Nutritional status F.  Physical activity G. Advance directives H. List of other physicians I.  Hospitalizations, surgeries, and ER visits in previous 12 months J.  [place] such as hearing and vision if needed, cognitive and depression L. Referrals and appointments   In addition, I have reviewed and discussed with patient certain preventive protocols, quality metrics, and best practice recommendations. A written personalized care plan for preventive services as well as general preventive health recommendations were provided to patient.   Signed,  Tyler Aas, LPN Nurse Health Advisor   MD Recommendations:pt complaining of intermittent headaches in the past 4 weeks. Denies any irregular BP readings. States it is in the back of her neck, on top of her head and her temples. She states it wakes her up at night sometimes and happens during the day. She takes tylenol as needed but doesn't like to take it often due to dx of lupus. She sees her urologist tomorrow and will speak with him about the tylenol.  I, as supervising physician, have reviewed the nurse health advisors Medicare wellness visit note for this patient and concur with the findings and recommendations listed above. Golden Pop M.D.

## 2016-08-30 DIAGNOSIS — R809 Proteinuria, unspecified: Secondary | ICD-10-CM | POA: Diagnosis not present

## 2016-08-30 DIAGNOSIS — N183 Chronic kidney disease, stage 3 (moderate): Secondary | ICD-10-CM | POA: Diagnosis not present

## 2016-08-30 DIAGNOSIS — D631 Anemia in chronic kidney disease: Secondary | ICD-10-CM | POA: Diagnosis not present

## 2016-08-30 DIAGNOSIS — I10 Essential (primary) hypertension: Secondary | ICD-10-CM | POA: Diagnosis not present

## 2016-09-09 DIAGNOSIS — I10 Essential (primary) hypertension: Secondary | ICD-10-CM | POA: Diagnosis not present

## 2016-09-09 DIAGNOSIS — R809 Proteinuria, unspecified: Secondary | ICD-10-CM | POA: Diagnosis not present

## 2016-09-09 DIAGNOSIS — D631 Anemia in chronic kidney disease: Secondary | ICD-10-CM | POA: Diagnosis not present

## 2016-09-09 DIAGNOSIS — N183 Chronic kidney disease, stage 3 (moderate): Secondary | ICD-10-CM | POA: Diagnosis not present

## 2016-09-10 DIAGNOSIS — N183 Chronic kidney disease, stage 3 (moderate): Secondary | ICD-10-CM | POA: Diagnosis not present

## 2016-09-25 ENCOUNTER — Encounter: Payer: Self-pay | Admitting: Family Medicine

## 2016-09-25 ENCOUNTER — Ambulatory Visit (INDEPENDENT_AMBULATORY_CARE_PROVIDER_SITE_OTHER): Payer: Medicare Other | Admitting: Family Medicine

## 2016-09-25 VITALS — BP 125/76 | HR 77 | Wt 146.0 lb

## 2016-09-25 DIAGNOSIS — Z7189 Other specified counseling: Secondary | ICD-10-CM | POA: Diagnosis not present

## 2016-09-25 DIAGNOSIS — M1000 Idiopathic gout, unspecified site: Secondary | ICD-10-CM | POA: Diagnosis not present

## 2016-09-25 DIAGNOSIS — Z1329 Encounter for screening for other suspected endocrine disorder: Secondary | ICD-10-CM

## 2016-09-25 DIAGNOSIS — M316 Other giant cell arteritis: Secondary | ICD-10-CM | POA: Diagnosis not present

## 2016-09-25 DIAGNOSIS — K219 Gastro-esophageal reflux disease without esophagitis: Secondary | ICD-10-CM | POA: Diagnosis not present

## 2016-09-25 DIAGNOSIS — M069 Rheumatoid arthritis, unspecified: Secondary | ICD-10-CM

## 2016-09-25 DIAGNOSIS — M3214 Glomerular disease in systemic lupus erythematosus: Secondary | ICD-10-CM

## 2016-09-25 DIAGNOSIS — Z0000 Encounter for general adult medical examination without abnormal findings: Secondary | ICD-10-CM

## 2016-09-25 DIAGNOSIS — I10 Essential (primary) hypertension: Secondary | ICD-10-CM | POA: Diagnosis not present

## 2016-09-25 DIAGNOSIS — E785 Hyperlipidemia, unspecified: Secondary | ICD-10-CM

## 2016-09-25 LAB — MICROSCOPIC EXAMINATION: RBC, UA: NONE SEEN /hpf (ref 0–?)

## 2016-09-25 LAB — URINALYSIS, ROUTINE W REFLEX MICROSCOPIC
BILIRUBIN UA: NEGATIVE
GLUCOSE, UA: NEGATIVE
Ketones, UA: NEGATIVE
NITRITE UA: NEGATIVE
RBC, UA: NEGATIVE
SPEC GRAV UA: 1.01 (ref 1.005–1.030)
UUROB: 0.2 mg/dL (ref 0.2–1.0)
pH, UA: 5.5 (ref 5.0–7.5)

## 2016-09-25 MED ORDER — FUROSEMIDE 20 MG PO TABS: 20.0000 mg | ORAL_TABLET | Freq: Every morning | ORAL | 12 refills | Status: DC

## 2016-09-25 MED ORDER — FOLIC ACID 1 MG PO TABS: 1.0000 mg | ORAL_TABLET | Freq: Every day | ORAL | 12 refills | Status: DC

## 2016-09-25 MED ORDER — OMEPRAZOLE 20 MG PO CPDR: 20.0000 mg | DELAYED_RELEASE_CAPSULE | Freq: Every day | ORAL | 12 refills | Status: DC

## 2016-09-25 MED ORDER — CARVEDILOL 25 MG PO TABS: 25.0000 mg | ORAL_TABLET | Freq: Two times a day (BID) | ORAL | 12 refills | Status: DC

## 2016-09-25 NOTE — Assessment & Plan Note (Signed)
A voluntary discussion about advance care planning including the explanation and discussion of advance directives was extensively discussed  with the patient.  Explanation about the health care proxy and Living will was reviewed and packet with forms with explanation of how to fill them out was given.  Pt to bring back copy of filled out forms.

## 2016-09-25 NOTE — Assessment & Plan Note (Signed)
The current medical regimen is effective;  continue present plan and medications.  

## 2016-09-25 NOTE — Progress Notes (Signed)
BP 125/76   Pulse 77   Wt 146 lb (66.2 kg)   SpO2 99%   BMI 25.86 kg/m    Subjective:    Patient ID: Marissa Hess, female    DOB: Aug 04, 1934, 82 y.o.   MRN: [id number]  HPI: Marissa Hess is a 82 y.o. female  Chief Complaint  Patient presents with  . Annual Exam  . Gout  AWV metrics met Patient all in all doing well having gout flare and nephrologist called in gout medication last night which the patient hasn't picked up yet. All in all doing well reviewed rheumatology note and patient doing stable. Medical issues stable with patient with no complaints. Taking medications without problems and good control. In specific no chest pain issues no leg issues no headache or visual complaints. Arthralgias are stable.  Relevant past medical, surgical, family and social history reviewed and updated as indicated. Interim medical history since our last visit reviewed. Allergies and medications reviewed and updated.  Review of Systems  Constitutional: Negative.   HENT: Negative.   Eyes: Negative.   Respiratory: Negative.   Cardiovascular: Negative.   Gastrointestinal: Negative.   Endocrine: Negative.   Genitourinary: Negative.   Musculoskeletal: Negative.   Skin: Negative.   Allergic/Immunologic: Negative.   Neurological: Negative.   Hematological: Negative.   Psychiatric/Behavioral: Negative.     Per HPI unless specifically indicated above     Objective:    BP 125/76   Pulse 77   Wt 146 lb (66.2 kg)   SpO2 99%   BMI 25.86 kg/m   Wt Readings from Last 3 Encounters:  09/25/16 146 lb (66.2 kg)  08/29/16 146 lb 6.4 oz (66.4 kg)  04/17/16 150 lb (68 kg)    Physical Exam  Constitutional: She is oriented to person, place, and time. She appears well-developed and well-nourished.  HENT:  Head: Normocephalic and atraumatic.  Right Ear: External ear normal.  Left Ear: External ear normal.  Nose: Nose normal.  Mouth/Throat: Oropharynx is clear and moist.  Eyes:  Conjunctivae and EOM are normal. Pupils are equal, round, and reactive to light.  Neck: Normal range of motion. Neck supple. Carotid bruit is not present.  Cardiovascular: Normal rate, regular rhythm and normal heart sounds.   No murmur heard. Pulmonary/Chest: Effort normal and breath sounds normal. She exhibits no mass. Right breast exhibits no mass, no skin change and no tenderness. Left breast exhibits no mass, no skin change and no tenderness. Breasts are symmetrical.  Abdominal: Soft. Bowel sounds are normal. There is no hepatosplenomegaly.  Musculoskeletal: Normal range of motion.  Neurological: She is alert and oriented to person, place, and time.  Skin: No rash noted.  Psychiatric: She has a normal mood and affect. Her behavior is normal. Judgment and thought content normal.    Results for orders placed or performed in visit on 12/23/10  Basic metabolic panel  Result Value Ref Range   Glucose 65 65 - 99 mg/dL   BUN 23 8 - 27 mg/dL   Creatinine, Ser 1.70 (H) 0.57 - 1.00 mg/dL   GFR calc non Af Amer 28 (L) >59 mL/min/1.73   GFR calc Af Amer 33 (L) >59 mL/min/1.73   BUN/Creatinine Ratio 14 12 - 28   Sodium 142 134 - 144 mmol/L   Potassium 4.2 3.5 - 5.2 mmol/L   Chloride 108 (H) 96 - 106 mmol/L   CO2 19 18 - 29 mmol/L   Calcium 8.7 8.7 - 10.3 mg/dL  Assessment & Plan:   Problem List Items Addressed This Visit      Cardiovascular and Mediastinum   Temporal arteritis ([place]) (Chronic)    No further signs or symptoms.      Relevant Medications   furosemide (LASIX) 20 MG tablet   carvedilol (COREG) 25 MG tablet   Benign hypertension - Primary (Chronic)    The current medical regimen is effective;  continue present plan and medications.       Relevant Medications   furosemide (LASIX) 20 MG tablet   carvedilol (COREG) 25 MG tablet   Other Relevant Orders   Comprehensive metabolic panel   Urinalysis, Routine w reflex microscopic     Digestive   Esophageal reflux  (Chronic)    The current medical regimen is effective;  continue present plan and medications.       Relevant Medications   omeprazole (PRILOSEC) 20 MG capsule     Musculoskeletal and Integument   RA (rheumatoid arthritis) (HCC) (Chronic)    Stable followed by rheumatology        Genitourinary   Lupus glomerulonephritis ([place]) (Chronic)    Stable followed by rheumatology and nephrology      Relevant Medications   folic acid (FOLVITE) 1 MG tablet     Other   Hyperlipemia (Chronic)   Relevant Medications   furosemide (LASIX) 20 MG tablet   carvedilol (COREG) 25 MG tablet   Other Relevant Orders   CBC with Differential/Platelet   Lipid panel   Gout    Acute flare with medication to pick up      Relevant Orders   Uric acid   Advanced care planning/counseling discussion    A voluntary discussion about advance care planning including the explanation and discussion of advance directives was extensively discussed  with the patient.  Explanation about the health care proxy and Living will was reviewed and packet with forms with explanation of how to fill them out was given.  Pt to bring back copy of filled out forms.        Other Visit Diagnoses    Thyroid disorder screen       Relevant Orders   TSH   Encounter for Medicare annual wellness exam           Follow up plan: Return in about 6 months (around 03/28/2017) for BMP, uric acid.

## 2016-09-25 NOTE — Assessment & Plan Note (Signed)
Stable followed by rheumatology

## 2016-09-25 NOTE — Assessment & Plan Note (Signed)
Stable followed by rheumatology and nephrology

## 2016-09-25 NOTE — Assessment & Plan Note (Signed)
No further signs or symptoms.

## 2016-09-25 NOTE — Assessment & Plan Note (Signed)
Acute flare with medication to pick up

## 2016-09-26 LAB — LIPID PANEL
CHOLESTEROL TOTAL: 243 mg/dL — AB (ref 100–199)
Chol/HDL Ratio: 4.8 ratio — ABNORMAL HIGH (ref 0.0–4.4)
HDL: 51 mg/dL (ref >39)
LDL CALC: 171 mg/dL — AB (ref 0–99)
Triglycerides: 107 mg/dL (ref 0–149)
VLDL Cholesterol Cal: 21 mg/dL (ref 5–40)

## 2016-09-26 LAB — CBC WITH DIFFERENTIAL/PLATELET
BASOS: 1 %
Basophils Absolute: 0 10*3/uL (ref 0.0–0.2)
EOS (ABSOLUTE): 0.2 10*3/uL (ref 0.0–0.4)
EOS: 3 %
HEMATOCRIT: 31.9 % — AB (ref 34.0–46.6)
HEMOGLOBIN: 10 g/dL — AB (ref 11.1–15.9)
Immature Grans (Abs): 0 10*3/uL (ref 0.0–0.1)
Immature Granulocytes: 0 %
LYMPHS ABS: 2.1 10*3/uL (ref 0.7–3.1)
Lymphs: 34 %
MCH: 27.2 pg (ref 26.6–33.0)
MCHC: 31.3 g/dL — AB (ref 31.5–35.7)
MCV: 87 fL (ref 79–97)
MONOCYTES: 8 %
MONOS ABS: 0.5 10*3/uL (ref 0.1–0.9)
NEUTROS ABS: 3.3 10*3/uL (ref 1.4–7.0)
Neutrophils: 54 %
Platelets: 209 10*3/uL (ref 150–379)
RBC: 3.68 x10E6/uL — ABNORMAL LOW (ref 3.77–5.28)
RDW: 16.9 % — ABNORMAL HIGH (ref 12.3–15.4)
WBC: 6.1 10*3/uL (ref 3.4–10.8)

## 2016-09-26 LAB — COMPREHENSIVE METABOLIC PANEL
A/G RATIO: 1.2 (ref 1.2–2.2)
ALBUMIN: 3.6 g/dL (ref 3.5–4.7)
ALK PHOS: 53 IU/L (ref 39–117)
ALT: 7 IU/L (ref 0–32)
AST: 11 IU/L (ref 0–40)
BILIRUBIN TOTAL: 0.2 mg/dL (ref 0.0–1.2)
BUN / CREAT RATIO: 14 (ref 12–28)
BUN: 32 mg/dL — ABNORMAL HIGH (ref 8–27)
CO2: 17 mmol/L — AB (ref 20–29)
CREATININE: 2.23 mg/dL — AB (ref 0.57–1.00)
Calcium: 8.8 mg/dL (ref 8.7–10.3)
Chloride: 107 mmol/L — ABNORMAL HIGH (ref 96–106)
GFR calc Af Amer: 23 mL/min/{1.73_m2} — ABNORMAL LOW (ref >59)
GFR calc non Af Amer: 20 mL/min/{1.73_m2} — ABNORMAL LOW (ref >59)
GLOBULIN, TOTAL: 3.1 g/dL (ref 1.5–4.5)
Glucose: 76 mg/dL (ref 65–99)
POTASSIUM: 4.9 mmol/L (ref 3.5–5.2)
SODIUM: 140 mmol/L (ref 134–144)
Total Protein: 6.7 g/dL (ref 6.0–8.5)

## 2016-09-26 LAB — URIC ACID: URIC ACID: 9 mg/dL — AB (ref 2.5–7.1)

## 2016-09-26 LAB — TSH: TSH: 2.5 u[IU]/mL (ref 0.450–4.500)

## 2016-10-01 ENCOUNTER — Telehealth: Payer: Self-pay

## 2016-10-01 NOTE — Telephone Encounter (Signed)
-----   Message from Guadalupe Maple, MD sent at 09/29/2016  1:56 PM EDT ----- Need to make sure patient has a follow-up appointment with nephrology sooner than later.

## 2016-10-01 NOTE — Telephone Encounter (Signed)
Pt has follow up w/ [facility] on Monday 10/07/16. Labs were printed and faxed to them.

## 2016-10-07 DIAGNOSIS — D631 Anemia in chronic kidney disease: Secondary | ICD-10-CM | POA: Diagnosis not present

## 2016-10-07 DIAGNOSIS — N2581 Secondary hyperparathyroidism of renal origin: Secondary | ICD-10-CM | POA: Diagnosis not present

## 2016-10-07 DIAGNOSIS — N184 Chronic kidney disease, stage 4 (severe): Secondary | ICD-10-CM | POA: Diagnosis not present

## 2016-10-07 DIAGNOSIS — R809 Proteinuria, unspecified: Secondary | ICD-10-CM | POA: Diagnosis not present

## 2016-10-09 ENCOUNTER — Other Ambulatory Visit (INDEPENDENT_AMBULATORY_CARE_PROVIDER_SITE_OTHER): Payer: Self-pay | Admitting: Nephrology

## 2016-10-09 ENCOUNTER — Other Ambulatory Visit (INDEPENDENT_AMBULATORY_CARE_PROVIDER_SITE_OTHER): Payer: Medicare Other

## 2016-10-09 DIAGNOSIS — D631 Anemia in chronic kidney disease: Secondary | ICD-10-CM

## 2016-10-09 DIAGNOSIS — R809 Proteinuria, unspecified: Secondary | ICD-10-CM

## 2016-10-09 DIAGNOSIS — N183 Chronic kidney disease, stage 3 unspecified: Secondary | ICD-10-CM

## 2016-10-09 DIAGNOSIS — N189 Chronic kidney disease, unspecified: Secondary | ICD-10-CM

## 2016-10-09 DIAGNOSIS — I10 Essential (primary) hypertension: Secondary | ICD-10-CM | POA: Diagnosis not present

## 2016-10-24 ENCOUNTER — Encounter (INDEPENDENT_AMBULATORY_CARE_PROVIDER_SITE_OTHER): Payer: Self-pay

## 2016-10-24 ENCOUNTER — Ambulatory Visit (INDEPENDENT_AMBULATORY_CARE_PROVIDER_SITE_OTHER): Payer: Self-pay | Admitting: Vascular Surgery

## 2016-11-14 ENCOUNTER — Encounter: Payer: Self-pay | Admitting: Family Medicine

## 2016-11-14 ENCOUNTER — Ambulatory Visit (INDEPENDENT_AMBULATORY_CARE_PROVIDER_SITE_OTHER): Payer: Medicare Other | Admitting: Family Medicine

## 2016-11-14 VITALS — BP 149/81 | HR 78 | Temp 97.8°F | Wt 145.0 lb

## 2016-11-14 DIAGNOSIS — R51 Headache: Secondary | ICD-10-CM | POA: Diagnosis not present

## 2016-11-14 DIAGNOSIS — R519 Headache, unspecified: Secondary | ICD-10-CM

## 2016-11-14 MED ORDER — BACLOFEN 5 MG PO TABS: 5.0000 mg | ORAL_TABLET | Freq: Three times a day (TID) | ORAL | 0 refills | Status: DC | PRN

## 2016-11-14 MED ORDER — PREDNISONE 20 MG PO TABS: 60.0000 mg | ORAL_TABLET | Freq: Every day | ORAL | 0 refills | Status: DC

## 2016-11-14 NOTE — Progress Notes (Signed)
BP (!) 149/81   Pulse 78   Temp 97.8 F (36.6 C) (Tympanic)   Wt 145 lb (65.8 kg)   SpO2 98%   BMI 25.69 kg/m    Subjective:    Patient ID: Marissa Hess, female    DOB: 03/22/1934, 82 y.o.   MRN: [id number]  HPI: Marissa Hess is a 82 y.o. female  Chief Complaint  Patient presents with  . Headache    All head is sore goes down neck and into left shoulder. comes and goes x 3 weeks.    2-3 weeks of waxing and waning headaches that seem to come down left side of face into left shoulder. States the pain is a squeezing quality, 5/10 at best. Resolves with tylenol. Not causing N/V, but some photophobia. Does have a hx of temporal arteritis on this side, states this feels different.   Seeing opthalmology in the morning for visual disturbances in left eye. Started noticing this about 5 days ago. Has not worsened since first noticed. States she can see a tiny bit at the bottom of her vision and the top but the whole center and majority is a black dot.   Past Medical History:  Diagnosis Date  . Allergy   . Arthritis   . GERD (gastroesophageal reflux disease)   . Lupus 2002  . Myocardial infarction ([place]) 2007  . Osteoporosis    Social History   Social History  . Marital status: Married    Spouse name: N/A  . Number of children: N/A  . Years of education: N/A   Occupational History  . Not on file.   Social History Main Topics  . Smoking status: Former Smoker    Types: Cigarettes    Start date: 09/11/1984    Quit date: 09/26/1994  . Smokeless tobacco: Never Used  . Alcohol use No  . Drug use: No  . Sexual activity: Not on file   Other Topics Concern  . Not on file   Social History Narrative  . No narrative on file   Relevant past medical, surgical, family and social history reviewed and updated as indicated. Interim medical history since our last visit reviewed. Allergies and medications reviewed and updated.  Review of Systems  Constitutional: Negative.     HENT: Negative.   Eyes: Positive for photophobia and visual disturbance.  Respiratory: Negative.   Cardiovascular: Negative.   Gastrointestinal: Negative.  Negative for nausea and vomiting.  Musculoskeletal: Negative.   Skin: Negative.   Neurological: Positive for headaches.  Psychiatric/Behavioral: Negative.    Per HPI unless specifically indicated above     Objective:    BP (!) 149/81   Pulse 78   Temp 97.8 F (36.6 C) (Tympanic)   Wt 145 lb (65.8 kg)   SpO2 98%   BMI 25.69 kg/m   Wt Readings from Last 3 Encounters:  11/14/16 145 lb (65.8 kg)  09/25/16 146 lb (66.2 kg)  08/29/16 146 lb 6.4 oz (66.4 kg)    Physical Exam  Constitutional: She is oriented to person, place, and time. She appears well-developed and well-nourished. No distress.  HENT:  Head: Atraumatic.  Eyes: Conjunctivae and EOM are normal.  Neck: Normal range of motion. Neck supple.  Cardiovascular: Normal rate and normal heart sounds.   Pulmonary/Chest: Effort normal and breath sounds normal. No respiratory distress.  Musculoskeletal: Normal range of motion. She exhibits no tenderness (left temple and jaw non-ttp).  Neurological: She is alert and oriented to person, place,  and time. No cranial nerve deficit. Coordination normal.  Skin: Skin is warm and dry.  Psychiatric: She has a normal mood and affect. Her behavior is normal.  Nursing note and vitals reviewed.   Results for orders placed or performed in visit on 09/25/16  Microscopic Examination  Result Value Ref Range   WBC, UA 0-5 0 - 5 /hpf   RBC, UA None seen 0 - 2 /hpf   Epithelial Cells (non renal) 0-10 0 - 10 /hpf   Bacteria, UA Few (A) None seen/Few   Yeast, UA Present (A) None seen  CBC with Differential/Platelet  Result Value Ref Range   WBC 6.1 3.4 - 10.8 x10E3/uL   RBC 3.68 (L) 3.77 - 5.28 x10E6/uL   Hemoglobin 10.0 (L) 11.1 - 15.9 g/dL   Hematocrit 31.9 (L) 34.0 - 46.6 %   MCV 87 79 - 97 fL   MCH 27.2 26.6 - 33.0 pg   MCHC  31.3 (L) 31.5 - 35.7 g/dL   RDW 16.9 (H) 12.3 - 15.4 %   Platelets 209 150 - 379 x10E3/uL   Neutrophils 54 Not Estab. %   Lymphs 34 Not Estab. %   Monocytes 8 Not Estab. %   Eos 3 Not Estab. %   Basos 1 Not Estab. %   Neutrophils Absolute 3.3 1.4 - 7.0 x10E3/uL   Lymphocytes Absolute 2.1 0.7 - 3.1 x10E3/uL   Monocytes Absolute 0.5 0.1 - 0.9 x10E3/uL   EOS (ABSOLUTE) 0.2 0.0 - 0.4 x10E3/uL   Basophils Absolute 0.0 0.0 - 0.2 x10E3/uL   Immature Granulocytes 0 Not Estab. %   Immature Grans (Abs) 0.0 0.0 - 0.1 x10E3/uL  Comprehensive metabolic panel  Result Value Ref Range   Glucose 76 65 - 99 mg/dL   BUN 32 (H) 8 - 27 mg/dL   Creatinine, Ser 2.23 (H) 0.57 - 1.00 mg/dL   GFR calc non Af Amer 20 (L) >59 mL/min/1.73   GFR calc Af Amer 23 (L) >59 mL/min/1.73   BUN/Creatinine Ratio 14 12 - 28   Sodium 140 134 - 144 mmol/L   Potassium 4.9 3.5 - 5.2 mmol/L   Chloride 107 (H) 96 - 106 mmol/L   CO2 17 (L) 20 - 29 mmol/L   Calcium 8.8 8.7 - 10.3 mg/dL   Total Protein 6.7 6.0 - 8.5 g/dL   Albumin 3.6 3.5 - 4.7 g/dL   Globulin, Total 3.1 1.5 - 4.5 g/dL   Albumin/Globulin Ratio 1.2 1.2 - 2.2   Bilirubin Total 0.2 0.0 - 1.2 mg/dL   Alkaline Phosphatase 53 39 - 117 IU/L   AST 11 0 - 40 IU/L   ALT 7 0 - 32 IU/L  Lipid panel  Result Value Ref Range   Cholesterol, Total 243 (H) 100 - 199 mg/dL   Triglycerides 107 0 - 149 mg/dL   HDL 51 >39 mg/dL   VLDL Cholesterol Cal 21 5 - 40 mg/dL   LDL Calculated 171 (H) 0 - 99 mg/dL   Chol/HDL Ratio 4.8 (H) 0.0 - 4.4 ratio  TSH  Result Value Ref Range   TSH 2.500 0.450 - 4.500 uIU/mL  Urinalysis, Routine w reflex microscopic  Result Value Ref Range   Specific Gravity, UA 1.010 1.005 - 1.030   pH, UA 5.5 5.0 - 7.5   Color, UA Yellow Yellow   Appearance Ur Clear Clear   Leukocytes, UA Trace (A) Negative   Protein, UA 2+ (A) Negative/Trace   Glucose, UA Negative Negative   Ketones,  UA Negative Negative   RBC, UA Negative Negative   Bilirubin,  UA Negative Negative   Urobilinogen, Ur 0.2 0.2 - 1.0 mg/dL   Nitrite, UA Negative Negative   Microscopic Examination See below:   Uric acid  Result Value Ref Range   Uric Acid 9.0 (H) 2.5 - 7.1 mg/dL      Assessment & Plan:   Problem List Items Addressed This Visit    None    Visit Diagnoses    Acute nonintractable headache, unspecified headache type    -  Primary   Relevant Medications   Baclofen 5 MG TABS    Given recent visual disturbance and unilateral HA, plus hx of temporal arteritis, will start 60 mg prednisone as precaution until she can see Opthalmology in the morning. Will also start baclofen prn to see if any tension component. Continue tylenol prn as that is providing some relief.   Follow up plan: Return for Opthalmology tomorrow.

## 2016-11-15 DIAGNOSIS — H3562 Retinal hemorrhage, left eye: Secondary | ICD-10-CM | POA: Diagnosis not present

## 2016-11-17 NOTE — Patient Instructions (Signed)
Follow up tomorrow for your eye appt

## 2016-11-19 DIAGNOSIS — H353221 Exudative age-related macular degeneration, left eye, with active choroidal neovascularization: Secondary | ICD-10-CM | POA: Diagnosis not present

## 2016-11-19 DIAGNOSIS — H348322 Tributary (branch) retinal vein occlusion, left eye, stable: Secondary | ICD-10-CM | POA: Diagnosis not present

## 2016-12-31 DIAGNOSIS — H353221 Exudative age-related macular degeneration, left eye, with active choroidal neovascularization: Secondary | ICD-10-CM | POA: Diagnosis not present

## 2017-01-10 DIAGNOSIS — N2581 Secondary hyperparathyroidism of renal origin: Secondary | ICD-10-CM | POA: Diagnosis not present

## 2017-01-10 DIAGNOSIS — E872 Acidosis: Secondary | ICD-10-CM | POA: Diagnosis not present

## 2017-01-10 DIAGNOSIS — R809 Proteinuria, unspecified: Secondary | ICD-10-CM | POA: Diagnosis not present

## 2017-01-10 DIAGNOSIS — I10 Essential (primary) hypertension: Secondary | ICD-10-CM | POA: Diagnosis not present

## 2017-01-10 DIAGNOSIS — D631 Anemia in chronic kidney disease: Secondary | ICD-10-CM | POA: Diagnosis not present

## 2017-01-10 DIAGNOSIS — N184 Chronic kidney disease, stage 4 (severe): Secondary | ICD-10-CM | POA: Diagnosis not present

## 2017-01-21 DIAGNOSIS — M3214 Glomerular disease in systemic lupus erythematosus: Secondary | ICD-10-CM | POA: Diagnosis not present

## 2017-01-21 DIAGNOSIS — M810 Age-related osteoporosis without current pathological fracture: Secondary | ICD-10-CM | POA: Diagnosis not present

## 2017-01-21 DIAGNOSIS — I7300 Raynaud's syndrome without gangrene: Secondary | ICD-10-CM | POA: Diagnosis not present

## 2017-01-21 DIAGNOSIS — Z79899 Other long term (current) drug therapy: Secondary | ICD-10-CM | POA: Diagnosis not present

## 2017-01-21 DIAGNOSIS — N183 Chronic kidney disease, stage 3 (moderate): Secondary | ICD-10-CM | POA: Diagnosis not present

## 2017-02-04 DIAGNOSIS — H353221 Exudative age-related macular degeneration, left eye, with active choroidal neovascularization: Secondary | ICD-10-CM | POA: Diagnosis not present

## 2017-03-19 ENCOUNTER — Ambulatory Visit (INDEPENDENT_AMBULATORY_CARE_PROVIDER_SITE_OTHER): Payer: Medicare Other | Admitting: Family Medicine

## 2017-03-19 VITALS — BP 172/97 | HR 73 | Wt 141.0 lb

## 2017-03-19 DIAGNOSIS — M069 Rheumatoid arthritis, unspecified: Secondary | ICD-10-CM

## 2017-03-19 MED ORDER — LIDOCAINE 5 % EX PTCH: 1.0000 | MEDICATED_PATCH | CUTANEOUS | 0 refills | Status: DC

## 2017-03-19 MED ORDER — DICLOFENAC SODIUM 1 % TD GEL: 2.0000 g | Freq: Four times a day (QID) | TRANSDERMAL | 2 refills | Status: DC

## 2017-03-19 NOTE — Progress Notes (Signed)
BP (!) 172/97   Pulse 73   Wt 141 lb (64 kg)   SpO2 100%   BMI 24.98 kg/m    Subjective:    Patient ID: Marissa Hess, female    DOB: [birth year], 83 y.o.   MRN: [id number]  HPI: Marissa Hess is a 83 y.o. female  Chief Complaint  Patient presents with  . Shoulder Pain    Left   Patient presents with an RA flare of left shoulder. Has been dealing with this off and on for years. Tries icy hot with some good relief. 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 Denies fevers, chills, recent injury.   Relevant past medical, surgical, family and social history reviewed and updated as indicated. Interim medical history since our last visit reviewed. Allergies and medications reviewed and updated.  Review of Systems  Constitutional: Negative.   HENT: Negative.   Eyes: Negative.   Respiratory: Negative.   Cardiovascular: Negative.   Gastrointestinal: Negative.   Musculoskeletal: Positive for arthralgias and joint swelling.  Skin: Negative.   Neurological: Negative.   Psychiatric/Behavioral: Negative.    Per HPI unless specifically indicated above     Objective:    BP (!) 172/97   Pulse 73   Wt 141 lb (64 kg)   SpO2 100%   BMI 24.98 kg/m   Wt Readings from Last 3 Encounters:  03/19/17 141 lb (64 kg)  11/14/16 145 lb (65.8 kg)  09/25/16 146 lb (66.2 kg)    Physical Exam  Constitutional: She is oriented to person, place, and time. She appears well-developed and well-nourished.  HENT:  Head: Atraumatic.  Eyes: Conjunctivae are normal. Pupils are equal, round, and reactive to light. No scleral icterus.  Neck: Normal range of motion. Neck supple.  Cardiovascular: Normal rate and normal heart sounds.  Pulmonary/Chest: Effort normal and breath sounds normal.  Musculoskeletal: She exhibits tenderness  (generalized joint and surrounding muscle tenderness).  Some stiffness on PROM but good motion for most part  Neurological: She is alert and oriented to person, place, and time.  Skin: Skin is warm and dry.  Psychiatric: She has a normal mood and affect. Her behavior is normal.  Nursing note and vitals reviewed.  Results for orders placed or performed in visit on 09/25/16  Microscopic Examination  Result Value Ref Range   WBC, UA 0-5 0 - 5 /hpf   RBC, UA None seen 0 - 2 /hpf   Epithelial Cells (non renal) 0-10 0 - 10 /hpf   Bacteria, UA Few (A) None seen/Few   Yeast, UA Present (A) None seen  CBC with Differential/Platelet  Result Value Ref Range   WBC 6.1 3.4 - 10.8 x10E3/uL   RBC 3.68 (L) 3.77 - 5.28 x10E6/uL   Hemoglobin 10.0 (L) 11.1 - 15.9 g/dL   Hematocrit 31.9 (L) 34.0 - 46.6 %   MCV 87 79 - 97 fL   MCH 27.2 26.6 - 33.0 pg   MCHC 31.3 (L) 31.5 - 35.7 g/dL   RDW 16.9 (H) 12.3 - 15.4 %   Platelets 209 150 - 379 x10E3/uL   Neutrophils 54 Not Estab. %   Lymphs 34 Not Estab. %   Monocytes 8 Not Estab. %   Eos 3 Not Estab. %   Basos 1 Not Estab. %   Neutrophils Absolute 3.3 1.4 -  7.0 x10E3/uL   Lymphocytes Absolute 2.1 0.7 - 3.1 x10E3/uL   Monocytes Absolute 0.5 0.1 - 0.9 x10E3/uL   EOS (ABSOLUTE) 0.2 0.0 - 0.4 x10E3/uL   Basophils Absolute 0.0 0.0 - 0.2 x10E3/uL   Immature Granulocytes 0 Not Estab. %   Immature Grans (Abs) 0.0 0.0 - 0.1 x10E3/uL  Comprehensive metabolic panel  Result Value Ref Range   Glucose 76 65 - 99 mg/dL   BUN 32 (H) 8 - 27 mg/dL   Creatinine, Ser 2.23 (H) 0.57 - 1.00 mg/dL   GFR calc non Af Amer 20 (L) >59 mL/min/1.73   GFR calc Af Amer 23 (L) >59 mL/min/1.73   BUN/Creatinine Ratio 14 12 - 28   Sodium 140 134 - 144 mmol/L   Potassium 4.9 3.5 - 5.2 mmol/L   Chloride 107 (H) 96 - 106 mmol/L   CO2 17 (L) 20 - 29 mmol/L   Calcium 8.8 8.7 - 10.3 mg/dL   Total Protein 6.7 6.0 - 8.5 g/dL   Albumin 3.6 3.5 - 4.7 g/dL   Globulin, Total 3.1 1.5 -  4.5 g/dL   Albumin/Globulin Ratio 1.2 1.2 - 2.2   Bilirubin Total 0.2 0.0 - 1.2 mg/dL   Alkaline Phosphatase 53 39 - 117 IU/L   AST 11 0 - 40 IU/L   ALT 7 0 - 32 IU/L  Lipid panel  Result Value Ref Range   Cholesterol, Total 243 (H) 100 - 199 mg/dL   Triglycerides 107 0 - 149 mg/dL   HDL 51 >39 mg/dL   VLDL Cholesterol Cal 21 5 - 40 mg/dL   LDL Calculated 171 (H) 0 - 99 mg/dL   Chol/HDL Ratio 4.8 (H) 0.0 - 4.4 ratio  TSH  Result Value Ref Range   TSH 2.500 0.450 - 4.500 uIU/mL  Urinalysis, Routine w reflex microscopic  Result Value Ref Range   Specific Gravity, UA 1.010 1.005 - 1.030   pH, UA 5.5 5.0 - 7.5   Color, UA Yellow Yellow   Appearance Ur Clear Clear   Leukocytes, UA Trace (A) Negative   Protein, UA 2+ (A) Negative/Trace   Glucose, UA Negative Negative   Ketones, UA Negative Negative   RBC, UA Negative Negative   Bilirubin, UA Negative Negative   Urobilinogen, Ur 0.2 0.2 - 1.0 mg/dL   Nitrite, UA Negative Negative   Microscopic Examination See below:   Uric acid  Result Value Ref Range   Uric Acid 9.0 (H) 2.5 - 7.1 mg/dL      Assessment & Plan:   Problem List Items Addressed This Visit      Musculoskeletal and Integument   RA (rheumatoid arthritis) ([facility]) - Primary (Chronic)    Cautious about medications she takes given her recent severe side effects - will try diclofenac gel to area, lidocaine patches, heat, and low dose baclofen. Has not followed up with her Rheumatologist as she is not sure about the medication that was recommended. Recommended she get back with them and ask about other options for maintenance.           Follow up plan: Return if symptoms worsen or fail to improve.

## 2017-03-21 ENCOUNTER — Encounter: Payer: Self-pay | Admitting: Family Medicine

## 2017-03-21 NOTE — Assessment & Plan Note (Signed)
Cautious about medications she takes given her recent severe side effects - will try diclofenac gel to area, lidocaine patches, heat, and low dose baclofen. Has not followed up with her Rheumatologist as she is not sure about the medication that was recommended. Recommended she get back with them and ask about other options for maintenance.

## 2017-03-21 NOTE — Patient Instructions (Signed)
Follow up as needed

## 2017-03-25 DIAGNOSIS — H353221 Exudative age-related macular degeneration, left eye, with active choroidal neovascularization: Secondary | ICD-10-CM | POA: Diagnosis not present

## 2017-03-31 ENCOUNTER — Ambulatory Visit: Payer: Medicare Other | Admitting: Family Medicine

## 2017-04-08 ENCOUNTER — Ambulatory Visit (INDEPENDENT_AMBULATORY_CARE_PROVIDER_SITE_OTHER): Payer: Medicare Other | Admitting: Family Medicine

## 2017-04-08 ENCOUNTER — Encounter: Payer: Self-pay | Admitting: Family Medicine

## 2017-04-08 VITALS — BP 144/74 | HR 80 | Wt 147.0 lb

## 2017-04-08 DIAGNOSIS — M3214 Glomerular disease in systemic lupus erythematosus: Secondary | ICD-10-CM | POA: Diagnosis not present

## 2017-04-08 DIAGNOSIS — N184 Chronic kidney disease, stage 4 (severe): Secondary | ICD-10-CM

## 2017-04-08 DIAGNOSIS — M1000 Idiopathic gout, unspecified site: Secondary | ICD-10-CM | POA: Diagnosis not present

## 2017-04-08 DIAGNOSIS — I10 Essential (primary) hypertension: Secondary | ICD-10-CM

## 2017-04-08 NOTE — Assessment & Plan Note (Signed)
Reviewed nephrology notes check BMP today Patient to review any medication change with nephrology.

## 2017-04-08 NOTE — Assessment & Plan Note (Signed)
Undergoing treatment change and treatment confusion reviewed various considerations for patient to review.  Especially paying attention to renal side effects reviewing with nephrology

## 2017-04-08 NOTE — Assessment & Plan Note (Signed)
The current medical regimen is effective;  continue present plan and medications.  

## 2017-04-08 NOTE — Progress Notes (Signed)
BP (!) 144/74   Pulse 80   Wt 147 lb (66.7 kg)   SpO2 97%   BMI 26.04 kg/m    Subjective:    Patient ID: Marissa Hess, female    DOB: 1934-07-13, 83 y.o.   MRN: [id number]  HPI: Marissa Hess is a 83 y.o. female  Chief Complaint  Patient presents with  . Follow-up  . Hyperlipidemia  . Hypertension  Patient with a lot going on medically lupus aggressive side effect from Plaquenil is causing patient getting shots in her eye of the medication is been offered to treat her issues concerned because of side effects. Reviewed patient's medical records including class IV CKD Lupus arthritis blindness.  Relevant past medical, surgical, family and social history reviewed and updated as indicated. Interim medical history since our last visit reviewed. Allergies and medications reviewed and updated.  Review of Systems  Constitutional: Negative.   Respiratory: Negative.   Cardiovascular: Negative.     Per HPI unless specifically indicated above     Objective:    BP (!) 144/74   Pulse 80   Wt 147 lb (66.7 kg)   SpO2 97%   BMI 26.04 kg/m   Wt Readings from Last 3 Encounters:  04/08/17 147 lb (66.7 kg)  03/19/17 141 lb (64 kg)  11/14/16 145 lb (65.8 kg)    Physical Exam  Constitutional: She is oriented to person, place, and time. She appears well-developed and well-nourished.  HENT:  Head: Normocephalic and atraumatic.  Eyes: Conjunctivae and EOM are normal.  Neck: Normal range of motion.  Cardiovascular: Normal rate, regular rhythm and normal heart sounds.  Pulmonary/Chest: Effort normal and breath sounds normal.  Musculoskeletal: Normal range of motion.  Neurological: She is alert and oriented to person, place, and time.  Skin: No erythema.  Psychiatric: She has a normal mood and affect. Her behavior is normal. Judgment and thought content normal.    Results for orders placed or performed in visit on 09/25/16  Microscopic Examination  Result Value Ref Range   WBC, UA 0-5 0 - 5 /hpf   RBC, UA None seen 0 - 2 /hpf   Epithelial Cells (non renal) 0-10 0 - 10 /hpf   Bacteria, UA Few (A) None seen/Few   Yeast, UA Present (A) None seen  CBC with Differential/Platelet  Result Value Ref Range   WBC 6.1 3.4 - 10.8 x10E3/uL   RBC 3.68 (L) 3.77 - 5.28 x10E6/uL   Hemoglobin 10.0 (L) 11.1 - 15.9 g/dL   Hematocrit 31.9 (L) 34.0 - 46.6 %   MCV 87 79 - 97 fL   MCH 27.2 26.6 - 33.0 pg   MCHC 31.3 (L) 31.5 - 35.7 g/dL   RDW 16.9 (H) 12.3 - 15.4 %   Platelets 209 150 - 379 x10E3/uL   Neutrophils 54 Not Estab. %   Lymphs 34 Not Estab. %   Monocytes 8 Not Estab. %   Eos 3 Not Estab. %   Basos 1 Not Estab. %   Neutrophils Absolute 3.3 1.4 - 7.0 x10E3/uL   Lymphocytes Absolute 2.1 0.7 - 3.1 x10E3/uL   Monocytes Absolute 0.5 0.1 - 0.9 x10E3/uL   EOS (ABSOLUTE) 0.2 0.0 - 0.4 x10E3/uL   Basophils Absolute 0.0 0.0 - 0.2 x10E3/uL   Immature Granulocytes 0 Not Estab. %   Immature Grans (Abs) 0.0 0.0 - 0.1 x10E3/uL  Comprehensive metabolic panel  Result Value Ref Range   Glucose 76 65 - 99 mg/dL  BUN 32 (H) 8 - 27 mg/dL   Creatinine, Ser 2.23 (H) 0.57 - 1.00 mg/dL   GFR calc non Af Amer 20 (L) >59 mL/min/1.73   GFR calc Af Amer 23 (L) >59 mL/min/1.73   BUN/Creatinine Ratio 14 12 - 28   Sodium 140 134 - 144 mmol/L   Potassium 4.9 3.5 - 5.2 mmol/L   Chloride 107 (H) 96 - 106 mmol/L   CO2 17 (L) 20 - 29 mmol/L   Calcium 8.8 8.7 - 10.3 mg/dL   Total Protein 6.7 6.0 - 8.5 g/dL   Albumin 3.6 3.5 - 4.7 g/dL   Globulin, Total 3.1 1.5 - 4.5 g/dL   Albumin/Globulin Ratio 1.2 1.2 - 2.2   Bilirubin Total 0.2 0.0 - 1.2 mg/dL   Alkaline Phosphatase 53 39 - 117 IU/L   AST 11 0 - 40 IU/L   ALT 7 0 - 32 IU/L  Lipid panel  Result Value Ref Range   Cholesterol, Total 243 (H) 100 - 199 mg/dL   Triglycerides 107 0 - 149 mg/dL   HDL 51 >39 mg/dL   VLDL Cholesterol Cal 21 5 - 40 mg/dL   LDL Calculated 171 (H) 0 - 99 mg/dL   Chol/HDL Ratio 4.8 (H) 0.0 - 4.4 ratio  TSH   Result Value Ref Range   TSH 2.500 0.450 - 4.500 uIU/mL  Urinalysis, Routine w reflex microscopic  Result Value Ref Range   Specific Gravity, UA 1.010 1.005 - 1.030   pH, UA 5.5 5.0 - 7.5   Color, UA Yellow Yellow   Appearance Ur Clear Clear   Leukocytes, UA Trace (A) Negative   Protein, UA 2+ (A) Negative/Trace   Glucose, UA Negative Negative   Ketones, UA Negative Negative   RBC, UA Negative Negative   Bilirubin, UA Negative Negative   Urobilinogen, Ur 0.2 0.2 - 1.0 mg/dL   Nitrite, UA Negative Negative   Microscopic Examination See below:   Uric acid  Result Value Ref Range   Uric Acid 9.0 (H) 2.5 - 7.1 mg/dL      Assessment & Plan:   Problem List Items Addressed This Visit      Cardiovascular and Mediastinum   Benign hypertension - Primary (Chronic)    The current medical regimen is effective;  continue present plan and medications.       Relevant Orders   Basic metabolic panel     Genitourinary   Lupus glomerulonephritis ([place]) (Chronic)    Undergoing treatment change and treatment confusion reviewed various considerations for patient to review.  Especially paying attention to renal side effects reviewing with nephrology      CKD (chronic kidney disease) stage 4, GFR 15-29 ml/min [facility])    Reviewed nephrology notes check BMP today Patient to review any medication change with nephrology.        Other   Gout   Relevant Orders   Uric acid       Follow up plan: Return in about 6 months (around 10/06/2017) for Physical Exam.

## 2017-04-09 ENCOUNTER — Encounter: Payer: Self-pay | Admitting: Family Medicine

## 2017-04-09 LAB — BASIC METABOLIC PANEL
BUN/Creatinine Ratio: 16 (ref 12–28)
BUN: 39 mg/dL — ABNORMAL HIGH (ref 8–27)
CHLORIDE: 109 mmol/L — AB (ref 96–106)
CO2: 18 mmol/L — AB (ref 20–29)
Calcium: 8.9 mg/dL (ref 8.7–10.3)
Creatinine, Ser: 2.48 mg/dL — ABNORMAL HIGH (ref 0.57–1.00)
GFR calc Af Amer: 21 mL/min/{1.73_m2} — ABNORMAL LOW (ref >59)
GFR calc non Af Amer: 18 mL/min/{1.73_m2} — ABNORMAL LOW (ref >59)
GLUCOSE: 87 mg/dL (ref 65–99)
POTASSIUM: 4.6 mmol/L (ref 3.5–5.2)
SODIUM: 142 mmol/L (ref 134–144)

## 2017-04-09 LAB — URIC ACID: URIC ACID: 8.7 mg/dL — AB (ref 2.5–7.1)

## 2017-05-20 DIAGNOSIS — H353221 Exudative age-related macular degeneration, left eye, with active choroidal neovascularization: Secondary | ICD-10-CM | POA: Diagnosis not present

## 2017-05-20 DIAGNOSIS — H348312 Tributary (branch) retinal vein occlusion, right eye, stable: Secondary | ICD-10-CM | POA: Diagnosis not present

## 2017-07-15 DIAGNOSIS — H353221 Exudative age-related macular degeneration, left eye, with active choroidal neovascularization: Secondary | ICD-10-CM | POA: Diagnosis not present

## 2017-07-18 DIAGNOSIS — N184 Chronic kidney disease, stage 4 (severe): Secondary | ICD-10-CM | POA: Diagnosis not present

## 2017-07-18 DIAGNOSIS — D631 Anemia in chronic kidney disease: Secondary | ICD-10-CM | POA: Diagnosis not present

## 2017-07-18 DIAGNOSIS — I10 Essential (primary) hypertension: Secondary | ICD-10-CM | POA: Diagnosis not present

## 2017-07-18 DIAGNOSIS — R809 Proteinuria, unspecified: Secondary | ICD-10-CM | POA: Diagnosis not present

## 2017-07-18 DIAGNOSIS — N2581 Secondary hyperparathyroidism of renal origin: Secondary | ICD-10-CM | POA: Diagnosis not present

## 2017-09-03 ENCOUNTER — Ambulatory Visit (INDEPENDENT_AMBULATORY_CARE_PROVIDER_SITE_OTHER): Payer: Medicare Other

## 2017-09-03 VITALS — BP 118/62 | HR 69 | Temp 98.2°F | Resp 16 | Ht 63.0 in | Wt 137.9 lb

## 2017-09-03 DIAGNOSIS — Z0000 Encounter for general adult medical examination without abnormal findings: Secondary | ICD-10-CM

## 2017-09-03 NOTE — Progress Notes (Signed)
Subjective:   Marissa Hess is a 83 y.o. female who presents for Medicare Annual (Subsequent) preventive examination.  Review of Systems:   Cardiac Risk Factors include: advanced age (>5men, >26 women);hypertension;dyslipidemia     Objective:     Vitals: BP 118/62 (BP Location: Left Arm, Patient Position: Sitting)   Pulse 69   Temp 98.2 F (36.8 C) (Temporal)   Resp 16   Ht 5\' 3"  (1.6 m)   Wt 137 lb 14.4 oz (62.6 kg)   BMI 24.43 kg/m   Body mass index is 24.43 kg/m.  Advanced Directives 09/03/2017 08/29/2016 01/03/2016  Does Patient Have a Medical Advance Directive? No No No  Would patient like information on creating a medical advance directive? Yes (MAU/Ambulatory/Procedural Areas - Information given) Yes (MAU/Ambulatory/Procedural Areas - Information given) No - patient declined information    Tobacco Social History   Tobacco Use  Smoking Status Former Smoker  . Types: Cigarettes  . Start date: 09/11/1984  . Last attempt to quit: 09/26/1994  . Years since quitting: 22.9  Smokeless Tobacco Never Used     Counseling given: Not Answered   Clinical Intake:  Pre-visit preparation completed: Yes  Pain : No/denies pain     Nutritional Status: BMI of 19-24  Normal Nutritional Risks: None Diabetes: No  How often do you need to have someone help you when you read instructions, pamphlets, or other written materials from your doctor or pharmacy?: 1 - Never What is the last grade level you completed in school?: 12th grade  Interpreter Needed?: No  Information entered by :: Tiffany HIll,LPN   Past Medical History:  Diagnosis Date  . Allergy   . Arthritis   . GERD (gastroesophageal reflux disease)   . Lupus ([place]) 2002  . Myocardial infarction ([place]) 2007  . Osteoporosis    Past Surgical History:  Procedure Laterality Date  . ABDOMINAL HYSTERECTOMY    . CATARACT EXTRACTION W/PHACO Right 01/03/2016   Procedure: CATARACT EXTRACTION PHACO AND INTRAOCULAR  LENS PLACEMENT ([place]);  Surgeon: Leandrew Koyanagi, MD;  Location: [facility];  Service: Ophthalmology;  Laterality: Right;  . EYE SURGERY     cataract  . HERNIA REPAIR    . TEMPORAL ARTERY BIOPSY / LIGATION    . TONSILLECTOMY     Family History  Problem Relation Age of Onset  . Stomach cancer Mother   . Arthritis Father   . Breast cancer Neg Hx    Social History   Socioeconomic History  . Marital status: Married    Spouse name: Not on file  . Number of children: Not on file  . Years of education: Not on file  . Highest education level: Not on file  Occupational History  . Not on file  Social Needs  . Financial resource strain: Not hard at all  . Food insecurity:    Worry: Never true    Inability: Never true  . Transportation needs:    Medical: No    Non-medical: No  Tobacco Use  . Smoking status: Former Smoker    Types: Cigarettes    Start date: 09/11/1984    Last attempt to quit: 09/26/1994    Years since quitting: 22.9  . Smokeless tobacco: Never Used  Substance and Sexual Activity  . Alcohol use: No    Alcohol/week: 0.0 oz  . Drug use: No  . Sexual activity: Not on file  Lifestyle  . Physical activity:    Days per week: 0 days  Minutes per session: 0 min  . Stress: Not at all  Relationships  . Social connections:    Talks on phone: More than three times a week    Gets together: More than three times a week    Attends religious service: More than 4 times per year    Active member of club or organization: Yes    Attends meetings of clubs or organizations: More than 4 times per year    Relationship status: Married  Other Topics Concern  . Not on file  Social History Narrative  . Not on file    Outpatient Encounter Medications as of 09/03/2017  Medication Sig  . aspirin EC 81 MG tablet Take 81 mg by mouth daily.  . calcitRIOL (ROCALTROL) 0.5 MCG capsule   . carvedilol (COREG) 25 MG tablet Take 1 tablet (25 mg total) by mouth 2 (two) times  daily with a meal.  . folic acid (FOLVITE) 1 MG tablet Take 1 tablet (1 mg total) by mouth daily.  . furosemide (LASIX) 20 MG tablet Take 1 tablet (20 mg total) by mouth every morning.  . sodium bicarbonate 650 MG tablet Take 1,300 mg by mouth 2 (two) times daily.  . Baclofen 5 MG TABS Take 5 mg by mouth 3 (three) times daily as needed. (Patient not taking: Reported on 09/03/2017)  . ergocalciferol (VITAMIN D2) 50000 units capsule Take 400 Units by mouth daily.   Marland Kitchen lidocaine (LIDODERM) 5 % Place 1 patch onto the skin daily. Remove & Discard patch within 12 hours or as directed by MD (Patient not taking: Reported on 09/03/2017)   No facility-administered encounter medications on file as of 09/03/2017.     Activities of Daily Living In your present state of health, do you have any difficulty performing the following activities: 09/03/2017  Hearing? N  Vision? Y  Comment limited vision in left eye   Difficulty concentrating or making decisions? N  Walking or climbing stairs? N  Dressing or bathing? N  Doing errands, shopping? N  Preparing Food and eating ? N  Using the Toilet? N  In the past six months, have you accidently leaked urine? N  Do you have problems with loss of bowel control? N  Managing your Medications? N  Managing your Finances? N  Housekeeping or managing your Housekeeping? N  Some recent data might be hidden    Patient Care Team: Guadalupe Maple, [name], [name], MD as Referring Physician (Ophthalmology)    Assessment:   This is a routine wellness examination for [place].  Exercise Activities and Dietary recommendations Current Exercise Habits: The patient does not participate in regular exercise at present, Exercise limited by: None identified  Goals    . DIET - INCREASE WATER INTAKE     Recommend continue drinking at least 6-8 glasses of water a day        Fall Risk Fall  Risk  09/03/2017 03/19/2017 11/14/2016 08/29/2016 04/17/2016  Falls in the past year? No No No No No   Is the patient's home free of loose throw rugs in walkways, pet beds, electrical cords, etc?   yes      Grab bars in the bathroom? no      Handrails on the stairs?   yes      Adequate lighting?   yes  Timed Get Up and Go performed: Completed in 8 seconds with no use of assistive  devices, steady gait. No intervention needed at this time.   Depression Screen PHQ 2/9 Scores 09/03/2017 03/19/2017 08/29/2016 10/16/2015  PHQ - 2 Score 2 0 0 0  PHQ- 9 Score 3 - - -     Cognitive Function     6CIT Screen 09/03/2017 08/29/2016  What Year? 0 points 0 points  What month? 0 points 0 points  What time? 0 points 0 points  Count back from 20 0 points 0 points  Months in reverse 0 points 0 points  Repeat phrase 2 points 2 points  Total Score 2 2    Immunization History  Administered Date(s) Administered  . Influenza, High Dose Seasonal PF 12/20/2016  . Influenza,inj,Quad PF,6+ Mos 03/06/2015  . Influenza-Unspecified 12/17/2015  . Pneumococcal-Unspecified 07/28/2008  . Tdap 05/26/2012    Qualifies for Shingles Vaccine? Yes, discussed shingrix vaccine   Screening Tests Health Maintenance  Topic Date Due  . PNA vac Low Risk Adult (2 of 2 - PCV13) 09/04/2018 (Originally 07/28/2009)  . INFLUENZA VACCINE  10/16/2017  . TETANUS/TDAP  05/27/2022  . DEXA SCAN  Completed    Cancer Screenings: Lung: Low Dose CT Chest recommended if Age 70-80 years, 30 pack-year currently smoking OR have quit w/in 15years. Patient does not qualify. Breast:  Up to date on Mammogram? No  Longer required Up to date of Bone Density/Dexa? Yes 11/07/2015 Colorectal: no longer required  Additional Screenings:  Hepatitis C Screening: not indicated      Plan:    I have personally reviewed and addressed the Medicare Annual Wellness questionnaire and have noted the following in the patient's chart:  A. Medical and social  history B. Use of alcohol, tobacco or illicit drugs  C. Current medications and supplements D. Functional ability and status E.  Nutritional status F.  Physical activity G. Advance directives H. List of other physicians I.  Hospitalizations, surgeries, and ER visits in previous 12 months J.  [place] such as hearing and vision if needed, cognitive and depression L. Referrals and appointments   In addition, I have reviewed and discussed with patient certain preventive protocols, quality metrics, and best practice recommendations. A written personalized care plan for preventive services as well as general preventive health recommendations were provided to patient.   Signed,  Tyler Aas, LPN Nurse Health Advisor   Nurse Notes:none

## 2017-09-03 NOTE — Patient Instructions (Signed)
Marissa Hess , Thank you for taking time to come for your Medicare Wellness Visit. I appreciate your ongoing commitment to your health goals. Please review the following plan we discussed and let me know if I can assist you in the future.   Screening recommendations/referrals: Colonoscopy: no longer required Mammogram: no longer required Bone Density: completed Recommended yearly ophthalmology/optometry visit for glaucoma screening and checkup Recommended yearly dental visit for hygiene and checkup  Vaccinations: Influenza vaccine: up to date Pneumococcal vaccine: declined pneumovax today Tdap vaccine: up to date Shingles vaccine: shingrix eligible, check with your insurance company for coverage  Advanced directives: Advance directive discussed with you today. I have provided a copy for you to complete at home and have notarized. Once this is complete please bring a copy in to our office so we can scan it into your chart.  Conditions/risks identified: Recommend continue drinking at least 6-8 glasses of water a day   Next appointment: Follow up on 10/08/2017 at 9:00am with [place].  Follow up in one year for your annual wellness exam.    Preventive Care 65 Years and Older, Female Preventive care refers to lifestyle choices and visits with your health care provider that can promote health and wellness. What does preventive care include?  A yearly physical exam. This is also called an annual well check.  Dental exams once or twice a year.  Routine eye exams. Ask your health care provider how often you should have your eyes checked.  Personal lifestyle choices, including:  Daily care of your teeth and gums.  Regular physical activity.  Eating a healthy diet.  Avoiding tobacco and drug use.  Limiting alcohol use.  Practicing safe sex.  Taking low-dose aspirin every day.  Taking vitamin and mineral supplements as recommended by your health care provider. What happens  during an annual well check? The services and screenings done by your health care provider during your annual well check will depend on your age, overall health, lifestyle risk factors, and family history of disease. Counseling  Your health care provider may ask you questions about your:  Alcohol use.  Tobacco use.  Drug use.  Emotional well-being.  Home and relationship well-being.  Sexual activity.  Eating habits.  History of falls.  Memory and ability to understand (cognition).  Work and work Statistician.  Reproductive health. Screening  You may have the following tests or measurements:  Height, weight, and BMI.  Blood pressure.  Lipid and cholesterol levels. These may be checked every 5 years, or more frequently if you are over 45 years old.  Skin check.  Lung cancer screening. You may have this screening every year starting at age 27 if you have a 30-pack-year history of smoking and currently smoke or have quit within the past 15 years.  Fecal occult blood test (FOBT) of the stool. You may have this test every year starting at age 68.  Flexible sigmoidoscopy or colonoscopy. You may have a sigmoidoscopy every 5 years or a colonoscopy every 10 years starting at age 23.  Hepatitis C blood test.  Hepatitis B blood test.  Sexually transmitted disease (STD) testing.  Diabetes screening. This is done by checking your blood sugar (glucose) after you have not eaten for a while (fasting). You may have this done every 1-3 years.  Bone density scan. This is done to screen for osteoporosis. You may have this done starting at age 43.  Mammogram. This may be done every 1-2 years. Talk to your health  care provider about how often you should have regular mammograms. Talk with your health care provider about your test results, treatment options, and if necessary, the need for more tests. Vaccines  Your health care provider may recommend certain vaccines, such  as:  Influenza vaccine. This is recommended every year.  Tetanus, diphtheria, and acellular pertussis (Tdap, Td) vaccine. You may need a Td booster every 10 years.  Zoster vaccine. You may need this after age 57.  Pneumococcal 13-valent conjugate (PCV13) vaccine. One dose is recommended after age 44.  Pneumococcal polysaccharide (PPSV23) vaccine. One dose is recommended after age 14. Talk to your health care provider about which screenings and vaccines you need and how often you need them. This information is not intended to replace advice given to you by your health care provider. Make sure you discuss any questions you have with your health care provider. Document Released: 03/31/2015 Document Revised: 11/22/2015 Document Reviewed: 01/03/2015 Elsevier Interactive Patient Education  2017 [place] Prevention in the Home Falls can cause injuries. They can happen to people of all ages. There are many things you can do to make your home safe and to help prevent falls. What can I do on the outside of my home?  Regularly fix the edges of walkways and driveways and fix any cracks.  Remove anything that might make you trip as you walk through a door, such as a raised step or threshold.  Trim any bushes or trees on the path to your home.  Use bright outdoor lighting.  Clear any walking paths of anything that might make someone trip, such as rocks or tools.  Regularly check to see if handrails are loose or broken. Make sure that both sides of any steps have handrails.  Any raised decks and porches should have guardrails on the edges.  Have any leaves, snow, or ice cleared regularly.  Use sand or salt on walking paths during winter.  Clean up any spills in your garage right away. This includes oil or grease spills. What can I do in the bathroom?  Use night lights.  Install grab bars by the toilet and in the tub and shower. Do not use towel bars as grab bars.  Use  non-skid mats or decals in the tub or shower.  If you need to sit down in the shower, use a plastic, non-slip stool.  Keep the floor dry. Clean up any water that spills on the floor as soon as it happens.  Remove soap buildup in the tub or shower regularly.  Attach bath mats securely with double-sided non-slip rug tape.  Do not have throw rugs and other things on the floor that can make you trip. What can I do in the bedroom?  Use night lights.  Make sure that you have a light by your bed that is easy to reach.  Do not use any sheets or blankets that are too big for your bed. They should not hang down onto the floor.  Have a firm chair that has side arms. You can use this for support while you get dressed.  Do not have throw rugs and other things on the floor that can make you trip. What can I do in the kitchen?  Clean up any spills right away.  Avoid walking on wet floors.  Keep items that you use a lot in easy-to-reach places.  If you need to reach something above you, use a strong step stool that has a grab  bar.  Keep electrical cords out of the way.  Do not use floor polish or wax that makes floors slippery. If you must use wax, use non-skid floor wax.  Do not have throw rugs and other things on the floor that can make you trip. What can I do with my stairs?  Do not leave any items on the stairs.  Make sure that there are handrails on both sides of the stairs and use them. Fix handrails that are broken or loose. Make sure that handrails are as long as the stairways.  Check any carpeting to make sure that it is firmly attached to the stairs. Fix any carpet that is loose or worn.  Avoid having throw rugs at the top or bottom of the stairs. If you do have throw rugs, attach them to the floor with carpet tape.  Make sure that you have a light switch at the top of the stairs and the bottom of the stairs. If you do not have them, ask someone to add them for you. What  else can I do to help prevent falls?  Wear shoes that:  Do not have high heels.  Have rubber bottoms.  Are comfortable and fit you well.  Are closed at the toe. Do not wear sandals.  If you use a stepladder:  Make sure that it is fully opened. Do not climb a closed stepladder.  Make sure that both sides of the stepladder are locked into place.  Ask someone to hold it for you, if possible.  Clearly mark and make sure that you can see:  Any grab bars or handrails.  First and last steps.  Where the edge of each step is.  Use tools that help you move around (mobility aids) if they are needed. These include:  Canes.  Walkers.  Scooters.  Crutches.  Turn on the lights when you go into a dark area. Replace any light bulbs as soon as they burn out.  Set up your furniture so you have a clear path. Avoid moving your furniture around.  If any of your floors are uneven, fix them.  If there are any pets around you, be aware of where they are.  Review your medicines with your doctor. Some medicines can make you feel dizzy. This can increase your chance of falling. Ask your doctor what other things that you can do to help prevent falls. This information is not intended to replace advice given to you by your health care provider. Make sure you discuss any questions you have with your health care provider. Document Released: 12/29/2008 Document Revised: 08/10/2015 Document Reviewed: 04/08/2014 Elsevier Interactive Patient Education  2017 [employer].

## 2017-09-23 DIAGNOSIS — H353221 Exudative age-related macular degeneration, left eye, with active choroidal neovascularization: Secondary | ICD-10-CM | POA: Diagnosis not present

## 2017-10-06 DIAGNOSIS — I10 Essential (primary) hypertension: Secondary | ICD-10-CM | POA: Diagnosis not present

## 2017-10-06 DIAGNOSIS — R809 Proteinuria, unspecified: Secondary | ICD-10-CM | POA: Diagnosis not present

## 2017-10-06 DIAGNOSIS — D631 Anemia in chronic kidney disease: Secondary | ICD-10-CM | POA: Diagnosis not present

## 2017-10-06 DIAGNOSIS — N184 Chronic kidney disease, stage 4 (severe): Secondary | ICD-10-CM | POA: Diagnosis not present

## 2017-10-06 DIAGNOSIS — N2581 Secondary hyperparathyroidism of renal origin: Secondary | ICD-10-CM | POA: Diagnosis not present

## 2017-10-08 ENCOUNTER — Encounter: Payer: Self-pay | Admitting: Family Medicine

## 2017-10-08 ENCOUNTER — Ambulatory Visit (INDEPENDENT_AMBULATORY_CARE_PROVIDER_SITE_OTHER): Payer: Medicare Other | Admitting: Family Medicine

## 2017-10-08 DIAGNOSIS — I10 Essential (primary) hypertension: Secondary | ICD-10-CM

## 2017-10-08 DIAGNOSIS — N184 Chronic kidney disease, stage 4 (severe): Secondary | ICD-10-CM | POA: Diagnosis not present

## 2017-10-08 DIAGNOSIS — M316 Other giant cell arteritis: Secondary | ICD-10-CM

## 2017-10-08 DIAGNOSIS — I129 Hypertensive chronic kidney disease with stage 1 through stage 4 chronic kidney disease, or unspecified chronic kidney disease: Secondary | ICD-10-CM

## 2017-10-08 DIAGNOSIS — M3214 Glomerular disease in systemic lupus erythematosus: Secondary | ICD-10-CM | POA: Diagnosis not present

## 2017-10-08 DIAGNOSIS — Z7189 Other specified counseling: Secondary | ICD-10-CM | POA: Diagnosis not present

## 2017-10-08 LAB — URINALYSIS, ROUTINE W REFLEX MICROSCOPIC
BILIRUBIN UA: NEGATIVE
Glucose, UA: NEGATIVE
KETONES UA: NEGATIVE
Nitrite, UA: NEGATIVE
RBC UA: NEGATIVE
SPEC GRAV UA: 1.015 (ref 1.005–1.030)
UUROB: 0.2 mg/dL (ref 0.2–1.0)
pH, UA: 7 (ref 5.0–7.5)

## 2017-10-08 LAB — MICROSCOPIC EXAMINATION: Bacteria, UA: NONE SEEN

## 2017-10-08 MED ORDER — CARVEDILOL 25 MG PO TABS: 25.0000 mg | ORAL_TABLET | Freq: Two times a day (BID) | ORAL | 12 refills | Status: DC

## 2017-10-08 MED ORDER — FOLIC ACID 1 MG PO TABS: 1.0000 mg | ORAL_TABLET | Freq: Every day | ORAL | 12 refills | Status: DC

## 2017-10-08 MED ORDER — FUROSEMIDE 20 MG PO TABS: 20.0000 mg | ORAL_TABLET | Freq: Every morning | ORAL | 12 refills | Status: DC

## 2017-10-08 NOTE — Assessment & Plan Note (Signed)
Is now blind in left eye but stable

## 2017-10-08 NOTE — Assessment & Plan Note (Signed)
Not Currently active

## 2017-10-08 NOTE — Assessment & Plan Note (Signed)
A voluntary discussion about advanced care planning including explanation and discussion of advanced directives was extentively discussed with the patient.  Explained about the healthcare proxy and living will was reviewed and packet with forms with expiration of how to fill them out was given.  Time spent: Encounter 16+ min individuals present: Patient 

## 2017-10-08 NOTE — Progress Notes (Signed)
BP 138/84 (BP Location: Left Arm, Patient Position: Sitting, Cuff Size: Normal)   Pulse 67   Temp 97.7 F (36.5 C) (Tympanic)   Ht 5\' 3"  (1.6 m)   Wt 138 lb 8 oz (62.8 kg)   SpO2 99%   BMI 24.53 kg/m    Subjective:    Patient ID: Marissa Hess, female    DOB: 1934-05-24, 83 y.o.   MRN: [id number]  HPI: Marissa Hess is a 83 y.o. female  Chief Complaint  Patient presents with  . Annual Exam   Patient all in all doing well with no complaints is followed by kidney doctor with good reports and stable kidneys. Left eye is just able to see shadows otherwise stable other medical conditions and medications.  No complaints taking faithfully without problems.  Relevant past medical, surgical, family and social history reviewed and updated as indicated. Interim medical history since our last visit reviewed. Allergies and medications reviewed and updated.  Review of Systems  Constitutional: Negative.   HENT: Negative.   Eyes: Negative.   Respiratory: Negative.   Cardiovascular: Negative.   Gastrointestinal: Negative.   Endocrine: Negative.   Genitourinary: Negative.   Musculoskeletal: Negative.   Skin: Negative.   Allergic/Immunologic: Negative.   Neurological: Negative.   Hematological: Negative.   Psychiatric/Behavioral: Negative.     Per HPI unless specifically indicated above     Objective:    BP 138/84 (BP Location: Left Arm, Patient Position: Sitting, Cuff Size: Normal)   Pulse 67   Temp 97.7 F (36.5 C) (Tympanic)   Ht 5\' 3"  (1.6 m)   Wt 138 lb 8 oz (62.8 kg)   SpO2 99%   BMI 24.53 kg/m   Wt Readings from Last 3 Encounters:  10/08/17 138 lb 8 oz (62.8 kg)  09/03/17 137 lb 14.4 oz (62.6 kg)  04/08/17 147 lb (66.7 kg)    Physical Exam  Constitutional: She is oriented to person, place, and time. She appears well-developed and well-nourished.  HENT:  Head: Normocephalic and atraumatic.  Right Ear: External ear normal.  Left Ear: External ear normal.    Nose: Nose normal.  Mouth/Throat: Oropharynx is clear and moist.  Eyes: Pupils are equal, round, and reactive to light. Conjunctivae and EOM are normal.  Neck: Normal range of motion. Neck supple. Carotid bruit is not present.  Cardiovascular: Normal rate, regular rhythm and normal heart sounds.  No murmur heard. Pulmonary/Chest: Effort normal and breath sounds normal. She exhibits no mass. Right breast exhibits no mass, no skin change and no tenderness. Left breast exhibits no mass, no skin change and no tenderness. Breasts are symmetrical.  Abdominal: Soft. Bowel sounds are normal. There is no hepatosplenomegaly.  Musculoskeletal: Normal range of motion.  Neurological: She is alert and oriented to person, place, and time.  Skin: No rash noted.  Psychiatric: She has a normal mood and affect. Her behavior is normal. Judgment and thought content normal.    Results for orders placed or performed in visit on 11/65/79  Basic metabolic panel  Result Value Ref Range   Glucose 87 65 - 99 mg/dL   BUN 39 (H) 8 - 27 mg/dL   Creatinine, Ser 2.48 (H) 0.57 - 1.00 mg/dL   GFR calc non Af Amer 18 (L) >59 mL/min/1.73   GFR calc Af Amer 21 (L) >59 mL/min/1.73   BUN/Creatinine Ratio 16 12 - 28   Sodium 142 134 - 144 mmol/L   Potassium 4.6 3.5 - 5.2 mmol/L  Chloride 109 (H) 96 - 106 mmol/L   CO2 18 (L) 20 - 29 mmol/L   Calcium 8.9 8.7 - 10.3 mg/dL  Uric acid  Result Value Ref Range   Uric Acid 8.7 (H) 2.5 - 7.1 mg/dL      Assessment & Plan:   Problem List Items Addressed This Visit      Cardiovascular and Mediastinum   Temporal arteritis (HCC) (Chronic)    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       Relevant Medications    furosemide (LASIX) 20 MG tablet   carvedilol (COREG) 25 MG tablet   Other Relevant Orders   Comprehensive metabolic panel   TSH     Genitourinary   Lupus glomerulonephritis ([place]) (Chronic)    Followed by nephrology      Relevant Medications   folic acid (FOLVITE) 1 MG tablet   Other Relevant Orders   Comprehensive metabolic panel   TSH   CKD (chronic kidney disease) stage 4, GFR 15-29 ml/min (HCC)    Followed by nephrology      Relevant Orders   Lipid panel   CBC with Differential/Platelet   TSH   Urinalysis, Routine w reflex microscopic     Other   Advanced care planning/counseling discussion    A voluntary discussion about advanced care planning including explanation and discussion of advanced directives was extentively discussed with the patient.  Explained about the healthcare proxy and living will was reviewed and packet with forms with expiration of how to fill them out was given.  Time spent: Encounter 16+ min individuals present: Patient          Follow up plan: Return in about 1 year (around 10/09/2018) for Physical Exam.

## 2017-10-08 NOTE — Assessment & Plan Note (Signed)
Followed by nephrology. 

## 2017-10-08 NOTE — Assessment & Plan Note (Signed)
The current medical regimen is effective;  continue present plan and medications.  

## 2017-10-09 ENCOUNTER — Telehealth: Payer: Self-pay | Admitting: Family Medicine

## 2017-10-09 LAB — CBC WITH DIFFERENTIAL/PLATELET
BASOS ABS: 0 10*3/uL (ref 0.0–0.2)
Basos: 0 %
EOS (ABSOLUTE): 0.1 10*3/uL (ref 0.0–0.4)
EOS: 2 %
HEMATOCRIT: 30 % — AB (ref 34.0–46.6)
HEMOGLOBIN: 9 g/dL — AB (ref 11.1–15.9)
IMMATURE GRANULOCYTES: 0 %
Immature Grans (Abs): 0 10*3/uL (ref 0.0–0.1)
Lymphocytes Absolute: 1.7 10*3/uL (ref 0.7–3.1)
Lymphs: 29 %
MCH: 25.7 pg — ABNORMAL LOW (ref 26.6–33.0)
MCHC: 30 g/dL — ABNORMAL LOW (ref 31.5–35.7)
MCV: 86 fL (ref 79–97)
MONOCYTES: 8 %
MONOS ABS: 0.5 10*3/uL (ref 0.1–0.9)
NEUTROS PCT: 61 %
Neutrophils Absolute: 3.5 10*3/uL (ref 1.4–7.0)
Platelets: 239 10*3/uL (ref 150–450)
RBC: 3.5 x10E6/uL — AB (ref 3.77–5.28)
RDW: 16.9 % — AB (ref 12.3–15.4)
WBC: 5.7 10*3/uL (ref 3.4–10.8)

## 2017-10-09 LAB — COMPREHENSIVE METABOLIC PANEL
A/G RATIO: 1 — AB (ref 1.2–2.2)
ALBUMIN: 3.6 g/dL (ref 3.5–4.7)
ALT: 5 IU/L (ref 0–32)
AST: 12 IU/L (ref 0–40)
Alkaline Phosphatase: 43 IU/L (ref 39–117)
BILIRUBIN TOTAL: 0.4 mg/dL (ref 0.0–1.2)
BUN / CREAT RATIO: 9 — AB (ref 12–28)
BUN: 28 mg/dL — ABNORMAL HIGH (ref 8–27)
CHLORIDE: 102 mmol/L (ref 96–106)
CO2: 23 mmol/L (ref 20–29)
Calcium: 9.2 mg/dL (ref 8.7–10.3)
Creatinine, Ser: 3.14 mg/dL — ABNORMAL HIGH (ref 0.57–1.00)
GFR calc non Af Amer: 13 mL/min/{1.73_m2} — ABNORMAL LOW (ref >59)
GFR, EST AFRICAN AMERICAN: 15 mL/min/{1.73_m2} — AB (ref >59)
GLOBULIN, TOTAL: 3.7 g/dL (ref 1.5–4.5)
Glucose: 81 mg/dL (ref 65–99)
POTASSIUM: 3.8 mmol/L (ref 3.5–5.2)
Sodium: 140 mmol/L (ref 134–144)
TOTAL PROTEIN: 7.3 g/dL (ref 6.0–8.5)

## 2017-10-09 LAB — TSH: TSH: 2.14 u[IU]/mL (ref 0.450–4.500)

## 2017-10-09 LAB — LIPID PANEL
CHOL/HDL RATIO: 6.5 ratio — AB (ref 0.0–4.4)
Cholesterol, Total: 252 mg/dL — ABNORMAL HIGH (ref 100–199)
HDL: 39 mg/dL — ABNORMAL LOW (ref >39)
LDL Calculated: 179 mg/dL — ABNORMAL HIGH (ref 0–99)
Triglycerides: 169 mg/dL — ABNORMAL HIGH (ref 0–149)
VLDL Cholesterol Cal: 34 mg/dL (ref 5–40)

## 2017-10-09 NOTE — Telephone Encounter (Signed)
Phone call Discussed with patient kidney failure and anemia from kidney failure being followed by nephrology which patient has routine follow-up with.

## 2017-10-09 NOTE — Telephone Encounter (Signed)
-----   Message from Amada Kingfisher, [place] sent at 10/09/2017 12:05 PM EDT ----- Patient was transferred to provider for telephone conversation.

## 2017-10-20 ENCOUNTER — Inpatient Hospital Stay: Payer: Medicare Other | Attending: Internal Medicine | Admitting: Internal Medicine

## 2017-10-20 ENCOUNTER — Inpatient Hospital Stay: Payer: Medicare Other

## 2017-10-20 ENCOUNTER — Other Ambulatory Visit: Payer: Self-pay

## 2017-10-20 VITALS — BP 163/94 | HR 71 | Temp 98.8°F | Resp 18 | Ht 63.0 in | Wt 138.0 lb

## 2017-10-20 DIAGNOSIS — M329 Systemic lupus erythematosus, unspecified: Secondary | ICD-10-CM | POA: Diagnosis not present

## 2017-10-20 DIAGNOSIS — D631 Anemia in chronic kidney disease: Secondary | ICD-10-CM | POA: Insufficient documentation

## 2017-10-20 DIAGNOSIS — N184 Chronic kidney disease, stage 4 (severe): Secondary | ICD-10-CM

## 2017-10-20 DIAGNOSIS — N189 Chronic kidney disease, unspecified: Secondary | ICD-10-CM | POA: Insufficient documentation

## 2017-10-20 LAB — IRON AND TIBC
IRON: 30 ug/dL (ref 28–170)
SATURATION RATIOS: 15 % (ref 10.4–31.8)
TIBC: 207 ug/dL — AB (ref 250–450)
UIBC: 178 ug/dL

## 2017-10-20 LAB — VITAMIN B12: Vitamin B-12: 161 pg/mL — ABNORMAL LOW (ref 180–914)

## 2017-10-20 LAB — CBC WITH DIFFERENTIAL/PLATELET
BASOS PCT: 1 %
Basophils Absolute: 0 10*3/uL (ref 0–0.1)
EOS ABS: 0.1 10*3/uL (ref 0–0.7)
EOS PCT: 2 %
HCT: 25.9 % — ABNORMAL LOW (ref 35.0–47.0)
HEMOGLOBIN: 8.5 g/dL — AB (ref 12.0–16.0)
Lymphocytes Relative: 28 %
Lymphs Abs: 1.7 10*3/uL (ref 1.0–3.6)
MCH: 27.4 pg (ref 26.0–34.0)
MCHC: 32.6 g/dL (ref 32.0–36.0)
MCV: 84.1 fL (ref 80.0–100.0)
Monocytes Absolute: 0.5 10*3/uL (ref 0.2–0.9)
Monocytes Relative: 9 %
Neutro Abs: 3.7 10*3/uL (ref 1.4–6.5)
Neutrophils Relative %: 60 %
PLATELETS: 290 10*3/uL (ref 150–440)
RBC: 3.09 MIL/uL — AB (ref 3.80–5.20)
RDW: 16.4 % — ABNORMAL HIGH (ref 11.5–14.5)
WBC: 6.1 10*3/uL (ref 3.6–11.0)

## 2017-10-20 LAB — FERRITIN: Ferritin: 88 ng/mL (ref 11–307)

## 2017-10-20 LAB — C-REACTIVE PROTEIN: CRP: 2.8 mg/dL — AB (ref <1.0)

## 2017-10-20 LAB — LACTATE DEHYDROGENASE: LDH: 105 U/L (ref 98–192)

## 2017-10-20 NOTE — Progress Notes (Signed)
[facility] NOTE  Patient Care Team: Guadalupe Maple, [name], [name], MD as Referring Physician (Ophthalmology)  CHIEF COMPLAINTS/PURPOSE OF CONSULTATION: ANEMIA  # ANEMIA sec CKD-IV/Iron def  # CKD-Stage IV [Dr. Lateef]- HTN/ SLE stage V s/p Kidney Bx in 2002 [Dr.Bach; [name]   No history exists.     HISTORY OF PRESENTING ILLNESS:  Marissa Hess 83 y.o.  female has been referred to [place] for further evaluation of her chronic anemia.  Patient states that she has been diagnosed with chronic kidney disease secondary to history of lupus.  She states to have a kidney biopsy many years ago.  Is currently stable on Plaquenil.  Patient is not currently on dialysis.  However noted to have worsening hemoglobin of the last many months.  She has been referred to [place] by nephrology for further evaluation recommendations.  Patient had a previous colonoscopy in 2007.  She is on iron pills.  No constipation.  No abdominal discomfort.  Review of Systems  Constitutional: Positive for malaise/fatigue. Negative for chills, diaphoresis, fever and weight loss.  HENT: Negative for nosebleeds and sore throat.   Eyes: Negative for double vision.  Respiratory: Negative for cough, hemoptysis, sputum production, shortness of breath and wheezing.   Cardiovascular: Negative for chest pain, palpitations, orthopnea and leg swelling.  Gastrointestinal: Negative for abdominal pain, blood in stool, constipation, diarrhea, heartburn, melena, nausea and vomiting.  Genitourinary: Negative for dysuria, frequency and urgency.  Musculoskeletal: Positive for back pain and joint pain.  Skin: Negative.  Negative for itching and rash.  Neurological: Negative for dizziness, tingling, focal weakness, weakness and headaches.  Endo/Heme/Allergies: Does not bruise/bleed easily.  Psychiatric/Behavioral: Negative for  depression. The patient is not nervous/anxious and does not have insomnia.      MEDICAL HISTORY:  Past Medical History:  Diagnosis Date  . Allergy   . Arthritis   . GERD (gastroesophageal reflux disease)   . Lupus ([place]) 2002  . Myocardial infarction ([place]) 2007  . Osteoporosis     SURGICAL HISTORY: Past Surgical History:  Procedure Laterality Date  . ABDOMINAL HYSTERECTOMY    . CATARACT EXTRACTION W/PHACO Right 01/03/2016   Procedure: CATARACT EXTRACTION PHACO AND INTRAOCULAR LENS PLACEMENT ([place]);  Surgeon: Leandrew Koyanagi, MD;  Location: [facility];  Service: Ophthalmology;  Laterality: Right;  . EYE SURGERY     cataract  . HERNIA REPAIR    . TEMPORAL ARTERY BIOPSY / LIGATION    . TONSILLECTOMY      SOCIAL HISTORY: Social History   Socioeconomic History  . Marital status: Married    Spouse name: Not on file  . Number of children: Not on file  . Years of education: Not on file  . Highest education level: Not on file  Occupational History  . Not on file  Social Needs  . Financial resource strain: Not hard at all  . Food insecurity:    Worry: Never true    Inability: Never true  . Transportation needs:    Medical: No    Non-medical: No  Tobacco Use  . Smoking status: Former Smoker    Types: Cigarettes    Start date: 09/11/1984    Last attempt to quit: 09/26/1994    Years since quitting: 23.1  . Smokeless tobacco: Never Used  Substance and Sexual Activity  . Alcohol use: No    Alcohol/week: 0.0 standard drinks  . Drug use:  No  . Sexual activity: Not on file  Lifestyle  . Physical activity:    Days per week: 0 days    Minutes per session: 0 min  . Stress: Not at all  Relationships  . Social connections:    Talks on phone: More than three times a week    Gets together: More than three times a week    Attends religious service: More than 4 times per year    Active member of club or organization: Yes    Attends meetings of clubs or  organizations: More than 4 times per year    Relationship status: Married  . Intimate partner violence:    Fear of current or ex partner: No    Emotionally abused: No    Physically abused: No    Forced sexual activity: No  Other Topics Concern  . Not on file  Social History Narrative  . Not on file    FAMILY HISTORY: Family History  Problem Relation Age of Onset  . Stomach cancer Mother   . Arthritis Father   . Breast cancer Neg Hx     ALLERGIES:  is allergic to codeine; ibuprofen; sulfa antibiotics; vioxx [rofecoxib]; celebrex [celecoxib]; and penicillins.  MEDICATIONS:  Current Outpatient Medications  Medication Sig Dispense Refill  . aspirin EC 81 MG tablet Take 81 mg by mouth daily.    . calcitRIOL (ROCALTROL) 0.5 MCG capsule Take 0.5 mcg by mouth daily.     . carvedilol (COREG) 25 MG tablet Take 1 tablet (25 mg total) by mouth 2 (two) times daily with a meal. 60 tablet 12  . folic acid (FOLVITE) 1 MG tablet Take 1 tablet (1 mg total) by mouth daily. 30 tablet 12  . furosemide (LASIX) 20 MG tablet Take 1 tablet (20 mg total) by mouth every morning. 30 tablet 12  . sodium bicarbonate 650 MG tablet Take 1,300 mg by mouth 2 (two) times daily.     No current facility-administered medications for this visit.       Marland Kitchen  PHYSICAL EXAMINATION: ECOG PERFORMANCE STATUS: 1 - Symptomatic but completely ambulatory  Vitals:   10/20/17 1145  BP: (!) 163/94  Pulse: 71  Resp: 18  Temp: 98.8 F (37.1 C)   Filed Weights   10/20/17 1146  Weight: 138 lb (62.6 kg)    Physical Exam  Constitutional: She is oriented to person, place, [name]  HENT:  Head: Normocephalic and atraumatic.  Mouth/Throat: Oropharynx is clear and moist. No oropharyngeal exudate.  Eyes: Pupils are equal, round, and reactive to light.  Neck: Normal range of motion. Neck supple.  Cardiovascular: Normal rate and regular rhythm.   Pulmonary/Chest: No respiratory distress. She has no wheezes.  Abdominal: Soft. Bowel sounds are normal. She exhibits no distension and no mass. There is no tenderness. There is no rebound and no guarding.  Musculoskeletal: Normal range of motion. She exhibits no edema or tenderness.  Neurological: She is alert and oriented to person, place, and time.  Skin: Skin is warm.  Psychiatric: Affect normal.     LABORATORY DATA:  I have reviewed the data as listed Lab Results  Component Value Date   WBC 6.1 10/20/2017   HGB 8.5 (L) 10/20/2017   HCT 25.9 (L) 10/20/2017   MCV 84.1 10/20/2017   PLT 290 10/20/2017   Recent Labs    04/08/17 1101 10/08/17 1304  NA 142 140  K 4.6 3.8  CL 109* 102  CO2 18* 23  GLUCOSE 87 81  BUN 39* 28*  CREATININE 2.48* 3.14*  CALCIUM 8.9 9.2  GFRNONAA 18* 13*  GFRAA 21* 15*  PROT  --  7.3  ALBUMIN  --  3.6  AST  --  12  ALT  --  5  ALKPHOS  --  43  BILITOT  --  0.4    RADIOGRAPHIC STUDIES: I have personally reviewed the radiological images as listed and agreed with the findings in the report. No results found.  ASSESSMENT & PLAN:   Anemia associated with stage 4 chronic renal failure (HCC) #Anemia chronic kidney disease stage IV; likely secondary lupus.  Hemoglobin most recently 9.1 platelet count of 228.  Patient is symptomatic with fatigue.  #I would recommend further work-up with a repeat CBC iron studies ferritin kappa lambda light chains LDH haptoglobin, multiple myeloma panel.  #Discussed that if her iron levels are low-I would recommend IV iron supplementation.  However if her iron levels are adequate-recommend Aranesp.  # follow up in 1 months; possible venofer; cbc.   Thank you Dr.Lateef for allowing me to participate in the care of your pleasant patient. Please do not hesitate to contact me with questions or concerns in the interim.  Cc; Dr.Lateef.     All questions were answered. The patient knows to call the clinic with  any problems, questions or concerns.    Cammie Sickle, MD 11/10/2017 10:21 PM

## 2017-10-20 NOTE — Assessment & Plan Note (Deleted)
#  Anemia chronic kidney disease stage IV; likely secondary lupus.  Hemoglobin most recently 9.1 platelet count of 228.  #I would recommend further work-up with a repeat CBC iron studies ferritin kappa lambda light chains LDH haptoglobin, multiple myeloma panel.  #Discussed that if her iron levels are low-I would recommend IV iron supplementation.  However if her iron levels are adequate-recommend Aranesp.  # follow up in 1 months; possible venofer; cbc.   Cc; Dr.Lateef.

## 2017-10-20 NOTE — Patient Instructions (Signed)
#   take 1 iron pill one a day.

## 2017-10-21 LAB — KAPPA/LAMBDA LIGHT CHAINS
KAPPA, LAMDA LIGHT CHAIN RATIO: 1.15 (ref 0.26–1.65)
Kappa free light chain: 125.1 mg/L — ABNORMAL HIGH (ref 3.3–19.4)
LAMDA FREE LIGHT CHAINS: 108.4 mg/L — AB (ref 5.7–26.3)

## 2017-10-21 LAB — HAPTOGLOBIN: Haptoglobin: 205 mg/dL — ABNORMAL HIGH (ref 34–200)

## 2017-10-22 LAB — MULTIPLE MYELOMA PANEL, SERUM
ALBUMIN SERPL ELPH-MCNC: 3.4 g/dL (ref 2.9–4.4)
ALPHA 1: 0.3 g/dL (ref 0.0–0.4)
ALPHA2 GLOB SERPL ELPH-MCNC: 0.9 g/dL (ref 0.4–1.0)
Albumin/Glob SerPl: 0.9 (ref 0.7–1.7)
B-Globulin SerPl Elph-Mcnc: 1 g/dL (ref 0.7–1.3)
Gamma Glob SerPl Elph-Mcnc: 2 g/dL — ABNORMAL HIGH (ref 0.4–1.8)
Globulin, Total: 4.1 g/dL — ABNORMAL HIGH (ref 2.2–3.9)
IGG (IMMUNOGLOBIN G), SERUM: 2266 mg/dL — AB (ref 700–1600)
IGM (IMMUNOGLOBULIN M), SRM: 96 mg/dL (ref 26–217)
IgA: 276 mg/dL (ref 64–422)
TOTAL PROTEIN ELP: 7.5 g/dL (ref 6.0–8.5)

## 2017-11-03 DIAGNOSIS — N183 Chronic kidney disease, stage 3 (moderate): Secondary | ICD-10-CM | POA: Diagnosis not present

## 2017-11-03 DIAGNOSIS — D631 Anemia in chronic kidney disease: Secondary | ICD-10-CM | POA: Diagnosis not present

## 2017-11-03 DIAGNOSIS — I10 Essential (primary) hypertension: Secondary | ICD-10-CM | POA: Diagnosis not present

## 2017-11-03 DIAGNOSIS — R809 Proteinuria, unspecified: Secondary | ICD-10-CM | POA: Diagnosis not present

## 2017-11-10 DIAGNOSIS — D631 Anemia in chronic kidney disease: Secondary | ICD-10-CM | POA: Insufficient documentation

## 2017-11-10 DIAGNOSIS — N184 Chronic kidney disease, stage 4 (severe): Secondary | ICD-10-CM

## 2017-11-10 NOTE — Assessment & Plan Note (Addendum)
#  Anemia chronic kidney disease stage IV; likely secondary lupus.  Hemoglobin most recently 9.1 platelet count of 228.  Patient is symptomatic with fatigue.  #I would recommend further work-up with a repeat CBC iron studies ferritin kappa lambda light chains LDH haptoglobin, multiple myeloma panel.  #Discussed that if her iron levels are low-I would recommend IV iron supplementation.  However if her iron levels are adequate-recommend Aranesp.  # follow up in 1 months; possible venofer; cbc.   Thank you Dr.Lateef for allowing me to participate in the care of your pleasant patient. Please do not hesitate to contact me with questions or concerns in the interim.  Cc; Dr.Lateef.

## 2017-11-19 DIAGNOSIS — D631 Anemia in chronic kidney disease: Secondary | ICD-10-CM | POA: Diagnosis not present

## 2017-11-19 DIAGNOSIS — N185 Chronic kidney disease, stage 5: Secondary | ICD-10-CM | POA: Diagnosis not present

## 2017-11-19 DIAGNOSIS — I10 Essential (primary) hypertension: Secondary | ICD-10-CM | POA: Diagnosis not present

## 2017-11-19 DIAGNOSIS — N2581 Secondary hyperparathyroidism of renal origin: Secondary | ICD-10-CM | POA: Diagnosis not present

## 2017-11-19 DIAGNOSIS — E872 Acidosis: Secondary | ICD-10-CM | POA: Diagnosis not present

## 2017-11-24 ENCOUNTER — Telehealth: Payer: Self-pay | Admitting: Internal Medicine

## 2017-11-24 ENCOUNTER — Inpatient Hospital Stay (HOSPITAL_BASED_OUTPATIENT_CLINIC_OR_DEPARTMENT_OTHER): Payer: Medicare Other | Admitting: Internal Medicine

## 2017-11-24 ENCOUNTER — Inpatient Hospital Stay: Payer: Medicare Other

## 2017-11-24 ENCOUNTER — Inpatient Hospital Stay: Payer: Medicare Other | Attending: Internal Medicine

## 2017-11-24 ENCOUNTER — Encounter: Payer: Self-pay | Admitting: Internal Medicine

## 2017-11-24 VITALS — BP 152/87 | HR 69 | Temp 97.6°F | Resp 16 | Wt 132.0 lb

## 2017-11-24 DIAGNOSIS — M329 Systemic lupus erythematosus, unspecified: Secondary | ICD-10-CM

## 2017-11-24 DIAGNOSIS — N184 Chronic kidney disease, stage 4 (severe): Secondary | ICD-10-CM | POA: Insufficient documentation

## 2017-11-24 DIAGNOSIS — I252 Old myocardial infarction: Secondary | ICD-10-CM

## 2017-11-24 DIAGNOSIS — D631 Anemia in chronic kidney disease: Secondary | ICD-10-CM

## 2017-11-24 LAB — CBC WITH DIFFERENTIAL/PLATELET
Basophils Absolute: 0 10*3/uL (ref 0–0.1)
Basophils Relative: 1 %
Eosinophils Absolute: 0.1 10*3/uL (ref 0–0.7)
Eosinophils Relative: 2 %
HEMATOCRIT: 28.3 % — AB (ref 35.0–47.0)
HEMOGLOBIN: 9.1 g/dL — AB (ref 12.0–16.0)
LYMPHS ABS: 1.9 10*3/uL (ref 1.0–3.6)
LYMPHS PCT: 28 %
MCH: 27.4 pg (ref 26.0–34.0)
MCHC: 32.2 g/dL (ref 32.0–36.0)
MCV: 84.9 fL (ref 80.0–100.0)
MONO ABS: 0.8 10*3/uL (ref 0.2–0.9)
Monocytes Relative: 12 %
NEUTROS PCT: 57 %
Neutro Abs: 3.9 10*3/uL (ref 1.4–6.5)
Platelets: 250 10*3/uL (ref 150–440)
RBC: 3.33 MIL/uL — AB (ref 3.80–5.20)
RDW: 16.4 % — ABNORMAL HIGH (ref 11.5–14.5)
WBC: 6.9 10*3/uL (ref 3.6–11.0)

## 2017-11-24 NOTE — Assessment & Plan Note (Addendum)
#  Anemia chronic kidney disease stage IV; likely secondary lupus.  Hemoglobin most recently 9.1; saturation 15%.  Patient is symptomatic with fatigue.  Recommend IV iron every 2 weeks x 3.  #Low B12 161; recommend sublingual B12 pills.  If not improving then recommend IM B12.  #Chronic kidney disease stage IV-dialysis planning as per nephrology.   # Venofer on 9/27; 10/11; 1/25; follow up with MD in 2 months/cbc/b12-possible Venofer.   Cc; Dr.Lateef.

## 2017-11-24 NOTE — Progress Notes (Signed)
[facility] NOTE  Patient Care Team: Guadalupe Maple, [name], [name], [name]; [name]; [name]; [name];  [name]    No history exists.     HISTORY OF PRESENTING ILLNESS:  Marissa Hess 83 y.o.  female history of chronic kidney disease stage IV is here for follow-up for her anemia work-up.  Patient continues to complain of fatigue.  No blood in stools no black or stools.  She is  on p.o. Iron.  Patient is distraught that she has to see vein and vascular for fistula placement for possible dialysis needs in the future  Review of Systems  Constitutional: Positive for malaise/fatigue. Negative for chills, diaphoresis, fever and weight loss.  HENT: Negative for nosebleeds and sore throat.   Eyes: Negative for double vision.  Respiratory: Negative for cough, hemoptysis, sputum production, shortness of breath and wheezing.   Cardiovascular: Negative for chest pain, palpitations, orthopnea and leg swelling.  Gastrointestinal: Negative for abdominal pain, blood in stool, constipation, diarrhea, heartburn, melena, nausea and vomiting.  Genitourinary: Negative for dysuria, frequency and urgency.  Musculoskeletal: Positive for back pain and joint pain.  Skin: Negative.  Negative for itching and rash.  Neurological: Negative for dizziness, tingling, focal weakness, weakness and headaches.  Endo/Heme/Allergies: Does not bruise/bleed easily.  Psychiatric/Behavioral: Negative for depression. The patient is not nervous/anxious and does not have insomnia.      MEDICAL HISTORY:   Past Medical History:  Diagnosis Date  . Allergy   . Arthritis   . GERD (gastroesophageal reflux disease)   . Lupus ([place]) 2002  . Myocardial infarction ([place]) 2007  . Osteoporosis     SURGICAL HISTORY: Past Surgical History:  Procedure Laterality Date  . ABDOMINAL HYSTERECTOMY    . CATARACT EXTRACTION W/PHACO Right 01/03/2016   Procedure: CATARACT EXTRACTION PHACO AND INTRAOCULAR LENS PLACEMENT ([place]);  Surgeon: Leandrew Koyanagi, MD;  Location: [facility];  Service: Ophthalmology;  Laterality: Right;  . EYE SURGERY     cataract  . HERNIA REPAIR    . TEMPORAL ARTERY BIOPSY / LIGATION    . TONSILLECTOMY      SOCIAL HISTORY: Social History   Socioeconomic History  . Marital status: Married    Spouse name: Not on file  . Number of children: Not on file  . Years of education: Not on file  . Highest education level: Not on file  Occupational History  . Not on file  Social Needs  . Financial resource strain: Not hard at all  . Food insecurity:    Worry: Never true    Inability: Never true  . Transportation needs:    Medical: No    Non-medical: No  Tobacco Use  . Smoking status: Former Smoker    Types: Cigarettes    Start date: 09/11/1984    Last attempt to quit: 09/26/1994    Years since quitting: 23.1  . Smokeless tobacco: Never Used  Substance and Sexual Activity  . Alcohol use: No    Alcohol/week: 0.0 standard drinks  . Drug use: No  . Sexual activity: Not on file  Lifestyle  . Physical activity:  Days per week: 0 days    Minutes per session: 0 min  . Stress: Not at all  Relationships  . Social connections:    Talks on phone: More than three times a week    Gets together: More than three times a week    Attends religious service: More than 4 times per year    Active member of club or organization: Yes    Attends meetings of clubs or organizations: More than 4 times per year    Relationship status: Married  . Intimate partner violence:     Fear of current or ex partner: No    Emotionally abused: No    Physically abused: No    Forced sexual activity: No  Other Topics Concern  . Not on file  Social History Narrative  . Not on file    FAMILY HISTORY: Family History  Problem Relation Age of Onset  . Stomach cancer Mother   . Arthritis Father   . Breast cancer Neg Hx     ALLERGIES:  is allergic to codeine; ibuprofen; sulfa antibiotics; vioxx [rofecoxib]; celebrex [celecoxib]; and penicillins.  MEDICATIONS:  Current Outpatient Medications  Medication Sig Dispense Refill  . aspirin EC 81 MG tablet Take 81 mg by mouth daily.    . calcitRIOL (ROCALTROL) 0.5 MCG capsule Take 0.5 mcg by mouth daily.     . carvedilol (COREG) 25 MG tablet Take 1 tablet (25 mg total) by mouth 2 (two) times daily with a meal. 60 tablet 12  . ferrous sulfate (SLOW RELEASE IRON) 160 (50 Fe) MG TBCR SR tablet Take by mouth daily.    . folic acid (FOLVITE) 1 MG tablet Take 1 tablet (1 mg total) by mouth daily. 30 tablet 12  . furosemide (LASIX) 20 MG tablet Take 1 tablet (20 mg total) by mouth every morning. 30 tablet 12  . sodium bicarbonate 650 MG tablet Take 1,300 mg by mouth 2 (two) times daily.     No current facility-administered medications for this visit.       Marland Kitchen  PHYSICAL EXAMINATION: ECOG PERFORMANCE STATUS: 1 - Symptomatic but completely ambulatory  Vitals:   11/24/17 1433  BP: (!) 152/87  Pulse: 69  Resp: 16  Temp: 97.6 F (36.4 C)   Filed Weights   11/24/17 1433  Weight: 132 lb (59.9 kg)    Physical Exam  Constitutional: She is oriented to person, place, and time.  Marissa Hess female patient resting comfortably in the chair.  HENT:  Head: Normocephalic and atraumatic.  Mouth/Throat: Oropharynx is clear and moist. No oropharyngeal exudate.  Eyes: Pupils are equal, round, and reactive to light.  Neck: Normal range of motion. Neck supple.  Cardiovascular: Normal rate and regular rhythm.  Pulmonary/Chest: No  respiratory distress. She has no wheezes.  Abdominal: Soft. Bowel sounds are normal. She exhibits no distension and no mass. There is no tenderness. There is no rebound and no guarding.  Musculoskeletal: Normal range of motion. She exhibits no edema or tenderness.  Neurological: She is alert and oriented to person, place, and time.  Skin: Skin is warm.  Psychiatric: Affect normal.     LABORATORY DATA:  I have reviewed the data as listed Lab Results  Component Value Date   WBC 6.9 11/24/2017   HGB 9.1 (L) 11/24/2017   HCT 28.3 (L) 11/24/2017   MCV 84.9 11/24/2017   PLT 250 11/24/2017   Recent Labs    04/08/17 1101 10/08/17 1304  NA 142 140  K  4.6 3.8  CL 109* 102  CO2 18* 23  GLUCOSE 87 81  BUN 39* 28*  CREATININE 2.48* 3.14*  CALCIUM 8.9 9.2  GFRNONAA 18* 13*  GFRAA 21* 15*  PROT  --  7.3  ALBUMIN  --  3.6  AST  --  12  ALT  --  5  ALKPHOS  --  43  BILITOT  --  0.4    RADIOGRAPHIC STUDIES: I have personally reviewed the radiological images as listed and agreed with the findings in the report. No results found.  ASSESSMENT & PLAN:   Anemia associated with stage 4 chronic renal failure (HCC) #Anemia chronic kidney disease stage IV; likely secondary lupus.  Hemoglobin most recently 9.1; saturation 15%.  Patient is symptomatic with fatigue.  Recommend IV iron every 2 weeks x 3.  #Low B12 161; recommend sublingual B12 pills.  If not improving then recommend IM B12.  #Chronic kidney disease stage IV-dialysis planning as per nephrology.   # Venofer on 9/27; 10/11; 1/25; follow up with MD in 2 months/cbc/b12-possible Venofer.   Cc; Dr.Lateef.     All questions were answered. The patient knows to call the clinic with any problems, questions or concerns.    Cammie Sickle, MD 11/24/2017 3:16 PM

## 2017-11-24 NOTE — Telephone Encounter (Signed)
x

## 2017-11-24 NOTE — Patient Instructions (Signed)
#  Recommend B12 1000 mcg sublingual daily.

## 2017-12-05 ENCOUNTER — Ambulatory Visit (INDEPENDENT_AMBULATORY_CARE_PROVIDER_SITE_OTHER): Payer: Medicare Other

## 2017-12-05 ENCOUNTER — Other Ambulatory Visit: Payer: Self-pay | Admitting: Nephrology

## 2017-12-05 ENCOUNTER — Ambulatory Visit (INDEPENDENT_AMBULATORY_CARE_PROVIDER_SITE_OTHER): Payer: Medicare Other | Admitting: Nurse Practitioner

## 2017-12-05 ENCOUNTER — Encounter (INDEPENDENT_AMBULATORY_CARE_PROVIDER_SITE_OTHER): Payer: Self-pay | Admitting: Nurse Practitioner

## 2017-12-05 VITALS — BP 155/85 | HR 69 | Resp 16 | Ht 63.0 in | Wt 130.6 lb

## 2017-12-05 DIAGNOSIS — N183 Chronic kidney disease, stage 3 unspecified: Secondary | ICD-10-CM

## 2017-12-05 DIAGNOSIS — N184 Chronic kidney disease, stage 4 (severe): Secondary | ICD-10-CM | POA: Diagnosis not present

## 2017-12-05 DIAGNOSIS — D631 Anemia in chronic kidney disease: Secondary | ICD-10-CM | POA: Diagnosis not present

## 2017-12-05 DIAGNOSIS — N185 Chronic kidney disease, stage 5: Secondary | ICD-10-CM | POA: Insufficient documentation

## 2017-12-05 DIAGNOSIS — I10 Essential (primary) hypertension: Secondary | ICD-10-CM

## 2017-12-05 DIAGNOSIS — E785 Hyperlipidemia, unspecified: Secondary | ICD-10-CM

## 2017-12-05 NOTE — Progress Notes (Signed)
Subjective:    Patient ID: Marissa Hess, female    DOB: 1935/02/09, 83 y.o.   MRN: [id number] Chief Complaint  Patient presents with  . Follow-up    HPI  Marissa Hess is a 83 y.o. female  seen for evaluation for dialysis access.  She is accompanied by her daughter Rise Paganini.  The patient has chronic renal insufficiency stage V secondary to atrophic kidney. The patient's most recent creatinine clearance is less than 20, her latest GFR was 14.. The patient volume status has not yet become an issue. Patient's blood pressures been relatively well controlled. There are mild uremic symptoms which appear to be relatively well tolerated at this time. The patient is right-handed.  The patient has been considering the various methods of dialysis and wishes to proceed with hemodialysis and therefore creation of AV access.  The patient denies amaurosis fugax or recent TIA symptoms. There are no recent neurological changes noted. The patient denies claudication symptoms or rest pain symptoms. The patient denies history of DVT, PE or superficial thrombophlebitis. The patient denies recent episodes of angina or shortness of breath.   The patient underwent bilateral upper extremity vein mapping today.  The left upper extremity does not have any optimal for AV fistula creation.  The right upper extremity has adequate size veins in the cephalic to the area of the antecubital fossa, as well as in the basilic vein.  Bilateral examination of arteries reveal adequate sizes as well as triphasic waveforms.  Review of Systems: Negative Unless Checked Constitutional: [x] Weight loss  [] Fever  [] Chills Cardiac: [] Chest pain   [] Chest pressure   [] Palpitations   [] Shortness of breath when laying flat   [] Shortness of breath with exertion. Vascular:  [] Pain in legs with walking   [] Pain in legs with standing  [] History of DVT   [] Phlebitis   [] Swelling in legs   [] Varicose veins   [] Non-healing ulcers Pulmonary:    [] Uses home oxygen   [] Productive cough   [] Hemoptysis   [] Wheeze  [] COPD   [] Asthma Neurologic:  [] Dizziness   [] Seizures   [] History of stroke   [] History of TIA  [] Aphasia   [] Vissual changes   [] Weakness or numbness in arm   [] Weakness or numbness in leg Musculoskeletal:   [] Joint swelling   [] Joint pain   [] Low back pain Hematologic:  [] Easy bruising  [] Easy bleeding   [] Hypercoagulable state   [x] Anemic Gastrointestinal:  [] Diarrhea   [] Vomiting  [] Gastroesophageal reflux/heartburn   [] Difficulty swallowing. Genitourinary:  [x] Chronic kidney disease   [] Difficult urination  [] Frequent urination   [] Blood in urine Skin:  [] Rashes   [] Ulcers  Psychological:  [] History of anxiety   []  History of major depression.     Objective:   Physical Exam  BP (!) 155/85 (BP Location: Right Arm)   Pulse 69   Resp 16   Ht 5\' 3"  (1.6 m)   Wt 130 lb 9.6 oz (59.2 kg)   BMI 23.13 kg/m   Past Medical History:  Diagnosis Date  . Allergy   . Arthritis   . GERD (gastroesophageal reflux disease)   . Lupus ([place]) 2002  . Myocardial infarction ([place]) 2007  . Osteoporosis      Gen: WD/WN, NAD, frail-appearing, anxious Head: [place]/AT, No temporalis wasting.  Ear/Nose/Throat: Hearing grossly intact, nares w/o erythema or drainage Eyes: PER, EOMI, sclera nonicteric.  Neck: Supple, no masses.  No JVD.  Pulmonary:  Good air movement, no use of accessory muscles.  Cardiac: RRR  Vascular:  Vessel Right Left  Radial  2+  2+  Gastrointestinal: soft, non-distended. No guarding/no peritoneal signs.  Musculoskeletal: M/S 5/5 throughout.  No deformity or atrophy.  Neurologic: Pain and light touch intact in extremities.  Symmetrical.  Speech is fluent. Motor exam as listed above. Psychiatric: Judgment intact, Mood & affect appropriate for pt's clinical situation. Dermatologic: No Venous rashes. No Ulcers Noted.  No changes consistent with cellulitis. Lymph : No Cervical lymphadenopathy, no lichenification or  skin changes of chronic lymphedema.   Social History   Socioeconomic History  . Marital status: Married    Spouse name: Not on file  . Number of children: Not on file  . Years of education: Not on file  . Highest education level: Not on file  Occupational History  . Not on file  Social Needs  . Financial resource strain: Not hard at all  . Food insecurity:    Worry: Never true    Inability: Never true  . Transportation needs:    Medical: No    Non-medical: No  Tobacco Use  . Smoking status: Former Smoker    Types: Cigarettes    Start date: 09/11/1984    Last attempt to quit: 09/26/1994    Years since quitting: 23.2  . Smokeless tobacco: Never Used  Substance and Sexual Activity  . Alcohol use: No    Alcohol/week: 0.0 standard drinks  . Drug use: No  . Sexual activity: Not on file  Lifestyle  . Physical activity:    Days per week: 0 days    Minutes per session: 0 min  . Stress: Not at all  Relationships  . Social connections:    Talks on phone: More than three times a week    Gets together: More than three times a week    Attends religious service: More than 4 times per year    Active member of club or organization: Yes    Attends meetings of clubs or organizations: More than 4 times per year    Relationship status: Married  . Intimate partner violence:    Fear of current or ex partner: No    Emotionally abused: No    Physically abused: No    Forced sexual activity: No  Other Topics Concern  . Not on file  Social History Narrative  . Not on file    Past Surgical History:  Procedure Laterality Date  . ABDOMINAL HYSTERECTOMY    . CATARACT EXTRACTION W/PHACO Right 01/03/2016   Procedure: CATARACT EXTRACTION PHACO AND INTRAOCULAR LENS PLACEMENT ([place]);  Surgeon: Leandrew Koyanagi, MD;  Location: [facility];  Service: Ophthalmology;  Laterality: Right;  . EYE SURGERY     cataract  . HERNIA REPAIR    . TEMPORAL ARTERY BIOPSY / LIGATION    .  TONSILLECTOMY      Family History  Problem Relation Age of Onset  . Stomach cancer Mother   . Arthritis Father   . Breast cancer Neg Hx     Allergies  Allergen Reactions  . Codeine Itching  . Ibuprofen     Avoids "because of lupus"  . Sulfa Antibiotics Diarrhea  . Vioxx [Rofecoxib]     Avoids "because of lupus"  . Celebrex [Celecoxib] Rash    Avoids "because of lupus"  . Penicillins Rash       Assessment & Plan:   1. CKD (chronic kidney disease), stage V ([place]) The patient underwent bilateral upper extremity vein mapping today.  The left upper  extremity does not have any optimal for AV fistula creation.  The right upper extremity has adequate size veins in the cephalic to the area of the antecubital fossa, as well as in the basilic vein.  Bilateral examination of arteries reveal adequate sizes as well as triphasic waveforms.  The patient has had previous abdominal surgeries for for hernias.  Per the patient and her daughter she had to have multiple surgery due to complications of a twisted bowel.  Due to these multiple abdominal surgeries, I do not believe that she would be an ideal candidate for peritoneal dialysis catheter placement.  Recommend:  At this time the patient does not have appropriate extremity access for dialysis  Patient should have a brachiocephalic AV fistula created.  In the event that brachiocephalic AV fistula creation is not possible based on the anatomy that is revealed during his surgery, a brachiobasilic AV fistula will be created instead.  The risks, benefits and alternative therapies were reviewed in detail with the patient her daughter.  All questions were answered.  The patient agrees to proceed with surgery.    2. Hyperlipidemia, unspecified hyperlipidemia type Continue statin as ordered and reviewed, no changes at this time   3. Benign hypertension Continue antihypertensive medications as already ordered, these medications have been reviewed  and there are no changes at this time.   4. Anemia of chronic kidney failure, stage 4 (severe) (HCC) Patient will be receiving an infusion of iron within the next week.  We will continue to follow with nephrology for anemia management.   Current Outpatient Medications on File Prior to Visit  Medication Sig Dispense Refill  . aspirin EC 81 MG tablet Take 81 mg by mouth daily.    . calcitRIOL (ROCALTROL) 0.5 MCG capsule Take 0.5 mcg by mouth daily.     . carvedilol (COREG) 25 MG tablet Take 1 tablet (25 mg total) by mouth 2 (two) times daily with a meal. 60 tablet 12  . ferrous sulfate (SLOW RELEASE IRON) 160 (50 Fe) MG TBCR SR tablet Take by mouth daily.    . folic acid (FOLVITE) 1 MG tablet Take 1 tablet (1 mg total) by mouth daily. 30 tablet 12  . furosemide (LASIX) 20 MG tablet Take 1 tablet (20 mg total) by mouth every morning. 30 tablet 12  . sodium bicarbonate 650 MG tablet Take 1,300 mg by mouth 2 (two) times daily.     No current facility-administered medications on file prior to visit.     There are no Patient Instructions on file for this visit. No follow-ups on file.   Kris Hartmann, NP

## 2017-12-09 ENCOUNTER — Other Ambulatory Visit (INDEPENDENT_AMBULATORY_CARE_PROVIDER_SITE_OTHER): Payer: Self-pay | Admitting: Nurse Practitioner

## 2017-12-11 ENCOUNTER — Encounter
Admission: RE | Admit: 2017-12-11 | Discharge: 2017-12-11 | Disposition: A | Discharge status: Home / Self Care | Payer: Medicare Other | Source: Ambulatory Visit | Attending: Vascular Surgery | Admitting: Vascular Surgery

## 2017-12-11 ENCOUNTER — Other Ambulatory Visit: Payer: Self-pay

## 2017-12-11 DIAGNOSIS — Z01818 Encounter for other preprocedural examination: Secondary | ICD-10-CM | POA: Insufficient documentation

## 2017-12-11 DIAGNOSIS — D631 Anemia in chronic kidney disease: Secondary | ICD-10-CM | POA: Diagnosis not present

## 2017-12-11 DIAGNOSIS — N184 Chronic kidney disease, stage 4 (severe): Secondary | ICD-10-CM | POA: Diagnosis not present

## 2017-12-11 DIAGNOSIS — Z01810 Encounter for preprocedural cardiovascular examination: Secondary | ICD-10-CM | POA: Diagnosis not present

## 2017-12-11 HISTORY — DX: Chronic kidney disease, unspecified: N18.9

## 2017-12-11 HISTORY — DX: Unspecified macular degeneration: H35.30

## 2017-12-11 LAB — CBC WITH DIFFERENTIAL/PLATELET
BASOS ABS: 0.1 10*3/uL (ref 0–0.1)
BASOS PCT: 1 %
EOS PCT: 3 %
Eosinophils Absolute: 0.1 10*3/uL (ref 0–0.7)
HEMATOCRIT: 27.6 % — AB (ref 35.0–47.0)
Hemoglobin: 9.1 g/dL — ABNORMAL LOW (ref 12.0–16.0)
Lymphocytes Relative: 30 %
Lymphs Abs: 1.7 10*3/uL (ref 1.0–3.6)
MCH: 27.7 pg (ref 26.0–34.0)
MCHC: 33 g/dL (ref 32.0–36.0)
MCV: 84.1 fL (ref 80.0–100.0)
MONO ABS: 0.5 10*3/uL (ref 0.2–0.9)
Monocytes Relative: 9 %
NEUTROS ABS: 3.2 10*3/uL (ref 1.4–6.5)
Neutrophils Relative %: 57 %
PLATELETS: 241 10*3/uL (ref 150–440)
RBC: 3.28 MIL/uL — AB (ref 3.80–5.20)
RDW: 17 % — ABNORMAL HIGH (ref 11.5–14.5)
WBC: 5.6 10*3/uL (ref 3.6–11.0)

## 2017-12-11 LAB — PROTIME-INR
INR: 0.95
Prothrombin Time: 12.6 seconds (ref 11.4–15.2)

## 2017-12-11 LAB — BASIC METABOLIC PANEL
Anion gap: 9 (ref 5–15)
BUN: 40 mg/dL — ABNORMAL HIGH (ref 8–23)
CALCIUM: 9.7 mg/dL (ref 8.9–10.3)
CO2: 26 mmol/L (ref 22–32)
Chloride: 104 mmol/L (ref 98–111)
Creatinine, Ser: 4.22 mg/dL — ABNORMAL HIGH (ref 0.44–1.00)
GFR, EST AFRICAN AMERICAN: 10 mL/min — AB (ref >60)
GFR, EST NON AFRICAN AMERICAN: 9 mL/min — AB (ref >60)
GLUCOSE: 93 mg/dL (ref 70–99)
Potassium: 4.2 mmol/L (ref 3.5–5.1)
SODIUM: 139 mmol/L (ref 135–145)

## 2017-12-11 LAB — TYPE AND SCREEN
ABO/RH(D): O POS
ANTIBODY SCREEN: NEGATIVE

## 2017-12-11 LAB — APTT: APTT: 38 s — AB (ref 24–36)

## 2017-12-11 NOTE — Patient Instructions (Signed)
Your procedure is scheduled on: Wednesday 12/17/17 Report to [facility]. To find out your arrival time please call [phone] between 1PM - 3PM on Tuesday 12/16/17.  Remember: Instructions that are not followed completely may result in serious medical risk, up to and including death, or upon the discretion of your surgeon and anesthesiologist your surgery may need to be rescheduled.     _X__ 1. Do not eat food after midnight the night before your procedure.                 No gum chewing or hard candies. You may drink clear liquids up to 2 hours                 before you are scheduled to arrive for your surgery- DO not drink clear                 liquids within 2 hours of the start of your surgery.                 Clear Liquids include:  water, apple juice without pulp, clear carbohydrate                 drink such as Clearfast or Gatorade, Black Coffee or Tea (Do not add                 anything to coffee or tea).  __X__2.  On the morning of surgery brush your teeth with toothpaste and water, you                 may rinse your mouth with mouthwash if you wish.  Do not swallow any              toothpaste of mouthwash.     _X__ 3.  No Alcohol for 24 hours before or after surgery.   _X__ 4.  Do Not Smoke or use e-cigarettes For 24 Hours Prior to Your Surgery.                 Do not use any chewable tobacco products for at least 6 hours prior to                 surgery.  ____  5.  Bring all medications with you on the day of surgery if instructed.   __X__  6.  Notify your doctor if there is any change in your medical condition      (cold, fever, infections).     Do not wear jewelry, make-up, hairpins, clips or nail polish. Do not wear lotions, powders, or perfumes.  Do not shave 48 hours prior to surgery. Men may shave face and neck. Do not bring valuables to the hospital.    [facility] is not responsible for any belongings or  valuables.  Contacts, dentures/partials or body piercings may not be worn into surgery. Bring a case for your contacts, glasses or hearing aids, a denture cup will be supplied. Leave your suitcase in the car. After surgery it may be brought to your room. For patients admitted to the hospital, discharge time is determined by your treatment team.   Patients discharged the day of surgery will not be allowed to drive home.   Please read over the following fact sheets that you were given:   MRSA Information  __X__ Take these medicines the morning of surgery with A SIP OF WATER:  1. carvedilol (COREG)  2.   3.   4.  5.  6.  ____ Fleet Enema (as directed)   __X__ Use CHG Soap/SAGE wipes as directed  ____ Use inhalers on the day of surgery  ____ Stop metformin/Janumet/Farxiga 2 days prior to surgery    ____ Take 1/2 of usual insulin dose the night before surgery. No insulin the morning          of surgery.   ____ Stop Blood Thinners Coumadin/Plavix/Xarelto/Pleta/Pradaxa/Eliquis/Effient/Aspirin  on   Or contact your Surgeon, Cardiologist or Medical Doctor regarding  ability to stop your blood thinners  __X__ Stop Anti-inflammatories 7 days before surgery such as Advil, Ibuprofen, Motrin,  BC or Goodies Powder, Naprosyn, Naproxen, Aleve   __X__ Stop all herbal supplements, fish oil or vitamin E until after surgery.    ____ Bring C-Pap to the hospital.

## 2017-12-12 ENCOUNTER — Inpatient Hospital Stay: Payer: Medicare Other

## 2017-12-12 VITALS — BP 124/70 | HR 72 | Temp 98.6°F | Resp 18

## 2017-12-12 DIAGNOSIS — D631 Anemia in chronic kidney disease: Secondary | ICD-10-CM

## 2017-12-12 DIAGNOSIS — N184 Chronic kidney disease, stage 4 (severe): Principal | ICD-10-CM

## 2017-12-12 DIAGNOSIS — I252 Old myocardial infarction: Secondary | ICD-10-CM | POA: Diagnosis not present

## 2017-12-12 DIAGNOSIS — M329 Systemic lupus erythematosus, unspecified: Secondary | ICD-10-CM | POA: Diagnosis not present

## 2017-12-12 MED ORDER — SODIUM CHLORIDE 0.9 % IV SOLN: 200.0000 mg | Freq: Once | INTRAVENOUS | Status: DC

## 2017-12-12 MED ORDER — SODIUM CHLORIDE 0.9 % IV SOLN
Freq: Once | INTRAVENOUS | Status: AC
  Administered 2017-12-12: 13:00:00 via INTRAVENOUS
  Filled 2017-12-12: qty 250

## 2017-12-12 MED ORDER — IRON SUCROSE 20 MG/ML IV SOLN
200.0000 mg | Freq: Once | INTRAVENOUS | Status: AC
  Administered 2017-12-12: 200 mg via INTRAVENOUS
  Filled 2017-12-12: qty 10

## 2017-12-16 MED ORDER — CLINDAMYCIN PHOSPHATE 900 MG/50ML IV SOLN
900.0000 mg | INTRAVENOUS | Status: AC
  Administered 2017-12-17: 900 mg via INTRAVENOUS

## 2017-12-17 ENCOUNTER — Other Ambulatory Visit: Payer: Self-pay

## 2017-12-17 ENCOUNTER — Ambulatory Visit: Payer: Medicare Other | Admitting: Anesthesiology

## 2017-12-17 ENCOUNTER — Ambulatory Visit
Admission: RE | Admit: 2017-12-17 | Discharge: 2017-12-17 | Disposition: A | Discharge status: Home / Self Care | Payer: Medicare Other | Source: Ambulatory Visit | Attending: Vascular Surgery | Admitting: Vascular Surgery

## 2017-12-17 ENCOUNTER — Encounter
Admission: RE | Disposition: A | Discharge status: Home / Self Care | Payer: Self-pay | Source: Ambulatory Visit | Attending: Vascular Surgery

## 2017-12-17 ENCOUNTER — Encounter: Payer: Self-pay | Admitting: *Deleted

## 2017-12-17 DIAGNOSIS — I2510 Atherosclerotic heart disease of native coronary artery without angina pectoris: Secondary | ICD-10-CM | POA: Insufficient documentation

## 2017-12-17 DIAGNOSIS — M810 Age-related osteoporosis without current pathological fracture: Secondary | ICD-10-CM | POA: Insufficient documentation

## 2017-12-17 DIAGNOSIS — Z803 Family history of malignant neoplasm of breast: Secondary | ICD-10-CM | POA: Insufficient documentation

## 2017-12-17 DIAGNOSIS — Z79899 Other long term (current) drug therapy: Secondary | ICD-10-CM | POA: Diagnosis not present

## 2017-12-17 DIAGNOSIS — Z8261 Family history of arthritis: Secondary | ICD-10-CM | POA: Insufficient documentation

## 2017-12-17 DIAGNOSIS — N185 Chronic kidney disease, stage 5: Secondary | ICD-10-CM

## 2017-12-17 DIAGNOSIS — Z888 Allergy status to other drugs, medicaments and biological substances status: Secondary | ICD-10-CM | POA: Diagnosis not present

## 2017-12-17 DIAGNOSIS — Z800 Family history of malignant neoplasm of digestive organs: Secondary | ICD-10-CM | POA: Insufficient documentation

## 2017-12-17 DIAGNOSIS — K219 Gastro-esophageal reflux disease without esophagitis: Secondary | ICD-10-CM | POA: Diagnosis not present

## 2017-12-17 DIAGNOSIS — M79631 Pain in right forearm: Secondary | ICD-10-CM | POA: Diagnosis not present

## 2017-12-17 DIAGNOSIS — I120 Hypertensive chronic kidney disease with stage 5 chronic kidney disease or end stage renal disease: Secondary | ICD-10-CM | POA: Insufficient documentation

## 2017-12-17 DIAGNOSIS — Z882 Allergy status to sulfonamides status: Secondary | ICD-10-CM | POA: Diagnosis not present

## 2017-12-17 DIAGNOSIS — D631 Anemia in chronic kidney disease: Secondary | ICD-10-CM | POA: Diagnosis not present

## 2017-12-17 DIAGNOSIS — Z880 Allergy status to penicillin: Secondary | ICD-10-CM | POA: Diagnosis not present

## 2017-12-17 DIAGNOSIS — M1990 Unspecified osteoarthritis, unspecified site: Secondary | ICD-10-CM | POA: Diagnosis not present

## 2017-12-17 DIAGNOSIS — Z90710 Acquired absence of both cervix and uterus: Secondary | ICD-10-CM | POA: Diagnosis not present

## 2017-12-17 DIAGNOSIS — Z9841 Cataract extraction status, right eye: Secondary | ICD-10-CM | POA: Diagnosis not present

## 2017-12-17 DIAGNOSIS — Z7982 Long term (current) use of aspirin: Secondary | ICD-10-CM | POA: Insufficient documentation

## 2017-12-17 DIAGNOSIS — Z885 Allergy status to narcotic agent status: Secondary | ICD-10-CM | POA: Diagnosis not present

## 2017-12-17 DIAGNOSIS — I252 Old myocardial infarction: Secondary | ICD-10-CM | POA: Insufficient documentation

## 2017-12-17 DIAGNOSIS — G8918 Other acute postprocedural pain: Secondary | ICD-10-CM | POA: Diagnosis not present

## 2017-12-17 HISTORY — PX: AV FISTULA PLACEMENT: SHX1204

## 2017-12-17 LAB — ABO/RH: ABO/RH(D): O POS

## 2017-12-17 SURGERY — ARTERIOVENOUS (AV) FISTULA CREATION
Anesthesia: Regional | Laterality: Right

## 2017-12-17 MED ORDER — SODIUM CHLORIDE 0.9 % IV SOLN
INTRAVENOUS | Status: DC
  Administered 2017-12-17: 11:00:00 via INTRAVENOUS

## 2017-12-17 MED ORDER — HEPARIN SODIUM (PORCINE) 5000 UNIT/ML IJ SOLN
INTRAMUSCULAR | Status: AC
  Filled 2017-12-17: qty 1

## 2017-12-17 MED ORDER — CLINDAMYCIN PHOSPHATE 900 MG/50ML IV SOLN
INTRAVENOUS | Status: AC
  Filled 2017-12-17: qty 50

## 2017-12-17 MED ORDER — ONDANSETRON HCL 4 MG/2ML IJ SOLN: 4.0000 mg | Freq: Once | INTRAMUSCULAR | Status: DC | PRN

## 2017-12-17 MED ORDER — HEPARIN SODIUM (PORCINE) 1000 UNIT/ML IJ SOLN
INTRAMUSCULAR | Status: DC | PRN
  Administered 2017-12-17: 3000 [IU] via INTRAVENOUS

## 2017-12-17 MED ORDER — EVICEL 2 ML EX KIT
PACK | CUTANEOUS | Status: DC | PRN
  Administered 2017-12-17: 2 mL

## 2017-12-17 MED ORDER — MEPERIDINE HCL 50 MG/ML IJ SOLN: 6.2500 mg | INTRAMUSCULAR | Status: DC | PRN

## 2017-12-17 MED ORDER — ROPIVACAINE HCL 5 MG/ML IJ SOLN
INTRAMUSCULAR | Status: AC
  Filled 2017-12-17: qty 30

## 2017-12-17 MED ORDER — FAMOTIDINE 20 MG PO TABS
20.0000 mg | ORAL_TABLET | Freq: Once | ORAL | Status: AC
  Administered 2017-12-17: 20 mg via ORAL

## 2017-12-17 MED ORDER — PAPAVERINE HCL 30 MG/ML IJ SOLN
INTRAMUSCULAR | Status: DC | PRN
  Administered 2017-12-17: 8 mL via INTRAVENOUS

## 2017-12-17 MED ORDER — MIDAZOLAM HCL 2 MG/2ML IJ SOLN
INTRAMUSCULAR | Status: AC
  Administered 2017-12-17: 1 mg via INTRAVENOUS
  Filled 2017-12-17: qty 2

## 2017-12-17 MED ORDER — LIDOCAINE HCL (PF) 1 % IJ SOLN
INTRAMUSCULAR | Status: DC | PRN
  Administered 2017-12-17: 3 mL

## 2017-12-17 MED ORDER — LIDOCAINE HCL (PF) 1 % IJ SOLN
INTRAMUSCULAR | Status: AC
  Filled 2017-12-17: qty 5

## 2017-12-17 MED ORDER — PROPOFOL 500 MG/50ML IV EMUL
INTRAVENOUS | Status: DC | PRN
  Administered 2017-12-17: 50 ug/kg/min via INTRAVENOUS

## 2017-12-17 MED ORDER — BUPIVACAINE-EPINEPHRINE (PF) 0.5% -1:200000 IJ SOLN
INTRAMUSCULAR | Status: AC
  Filled 2017-12-17: qty 30

## 2017-12-17 MED ORDER — PROPOFOL 10 MG/ML IV BOLUS
INTRAVENOUS | Status: AC
  Filled 2017-12-17: qty 40

## 2017-12-17 MED ORDER — PAPAVERINE HCL 30 MG/ML IJ SOLN
INTRAMUSCULAR | Status: AC
  Filled 2017-12-17: qty 2

## 2017-12-17 MED ORDER — FENTANYL CITRATE (PF) 100 MCG/2ML IJ SOLN
INTRAMUSCULAR | Status: AC
  Administered 2017-12-17: 50 ug via INTRAVENOUS
  Filled 2017-12-17: qty 2

## 2017-12-17 MED ORDER — MIDAZOLAM HCL 2 MG/2ML IJ SOLN
1.0000 mg | Freq: Once | INTRAMUSCULAR | Status: AC
  Administered 2017-12-17: 1 mg via INTRAVENOUS

## 2017-12-17 MED ORDER — FAMOTIDINE 20 MG PO TABS
ORAL_TABLET | ORAL | Status: AC
  Administered 2017-12-17: 20 mg via ORAL
  Filled 2017-12-17: qty 1

## 2017-12-17 MED ORDER — EVICEL 2 ML EX KIT
PACK | CUTANEOUS | Status: AC
  Filled 2017-12-17: qty 1

## 2017-12-17 MED ORDER — ROPIVACAINE HCL 5 MG/ML IJ SOLN
INTRAMUSCULAR | Status: DC | PRN
  Administered 2017-12-17: 20 mL via PERINEURAL
  Administered 2017-12-17: 10 mL

## 2017-12-17 MED ORDER — LIDOCAINE HCL (CARDIAC) PF 100 MG/5ML IV SOSY
PREFILLED_SYRINGE | INTRAVENOUS | Status: DC | PRN
  Administered 2017-12-17: 30 mg via INTRAVENOUS

## 2017-12-17 MED ORDER — TRAMADOL HCL 50 MG PO TABS: 50.0000 mg | ORAL_TABLET | Freq: Four times a day (QID) | ORAL | 0 refills | Status: DC | PRN

## 2017-12-17 MED ORDER — FENTANYL CITRATE (PF) 100 MCG/2ML IJ SOLN: 25.0000 ug | INTRAMUSCULAR | Status: DC | PRN

## 2017-12-17 MED ORDER — FENTANYL CITRATE (PF) 100 MCG/2ML IJ SOLN
50.0000 ug | Freq: Once | INTRAMUSCULAR | Status: AC
  Administered 2017-12-17: 50 ug via INTRAVENOUS

## 2017-12-17 SURGICAL SUPPLY — 51 items
BAG DECANTER FOR FLEXI CONT (MISCELLANEOUS) ×3 IMPLANT
BLADE SURG SZ11 CARB STEEL (BLADE) ×3 IMPLANT
BOOT SUTURE AID YELLOW STND (SUTURE) ×3 IMPLANT
BRUSH SCRUB EZ  4% CHG (MISCELLANEOUS) ×2
BRUSH SCRUB EZ 4% CHG (MISCELLANEOUS) ×1 IMPLANT
CANISTER SUCT 1200ML W/VALVE (MISCELLANEOUS) ×3 IMPLANT
CHLORAPREP W/TINT 26ML (MISCELLANEOUS) ×3 IMPLANT
CLIP SPRNG 6MM S-JAW DBL (CLIP) ×3
DERMABOND ADVANCED (GAUZE/BANDAGES/DRESSINGS) ×2
DERMABOND ADVANCED .7 DNX12 (GAUZE/BANDAGES/DRESSINGS) ×1 IMPLANT
ELECT CAUTERY BLADE 6.4 (BLADE) ×3 IMPLANT
ELECT REM PT RETURN 9FT ADLT (ELECTROSURGICAL) ×3
ELECTRODE REM PT RTRN 9FT ADLT (ELECTROSURGICAL) ×1 IMPLANT
GEL ULTRASOUND 20GR AQUASONIC (MISCELLANEOUS) IMPLANT
GLOVE BIO SURGEON STRL SZ7 (GLOVE) ×6 IMPLANT
GLOVE INDICATOR 7.5 STRL GRN (GLOVE) ×3 IMPLANT
GOWN STRL REUS W/ TWL LRG LVL3 (GOWN DISPOSABLE) ×1 IMPLANT
GOWN STRL REUS W/ TWL XL LVL3 (GOWN DISPOSABLE) ×2 IMPLANT
GOWN STRL REUS W/TWL LRG LVL3 (GOWN DISPOSABLE) ×2
GOWN STRL REUS W/TWL XL LVL3 (GOWN DISPOSABLE) ×4
HEMOSTAT SURGICEL 2X3 (HEMOSTASIS) ×3 IMPLANT
IV NS 500ML (IV SOLUTION) ×2
IV NS 500ML BAXH (IV SOLUTION) ×1 IMPLANT
KIT TURNOVER KIT A (KITS) ×3 IMPLANT
LABEL OR SOLS (LABEL) ×3 IMPLANT
LOOP RED MAXI  1X406MM (MISCELLANEOUS) ×2
LOOP VESSEL MAXI 1X406 RED (MISCELLANEOUS) ×1 IMPLANT
LOOP VESSEL MINI 0.8X406 BLUE (MISCELLANEOUS) ×1 IMPLANT
LOOPS BLUE MINI 0.8X406MM (MISCELLANEOUS) ×2
NEEDLE FILTER BLUNT 18X 1/2SAF (NEEDLE) ×2
NEEDLE FILTER BLUNT 18X1 1/2 (NEEDLE) ×1 IMPLANT
NEEDLE HYPO 30X.5 LL (NEEDLE) IMPLANT
NS IRRIG 500ML POUR BTL (IV SOLUTION) ×3 IMPLANT
PACK EXTREMITY ARMC (MISCELLANEOUS) ×3 IMPLANT
PAD PREP 24X41 OB/GYN DISP (PERSONAL CARE ITEMS) ×3 IMPLANT
SOLUTION CELL SAVER (CLIP) ×1 IMPLANT
STOCKINETTE STRL 4IN 9604848 (GAUZE/BANDAGES/DRESSINGS) ×3 IMPLANT
SUT MNCRL AB 4-0 PS2 18 (SUTURE) ×3 IMPLANT
SUT PROLENE 6 0 BV (SUTURE) ×12 IMPLANT
SUT SILK 2 0 (SUTURE) ×2
SUT SILK 2-0 18XBRD TIE 12 (SUTURE) ×1 IMPLANT
SUT SILK 3 0 (SUTURE) ×2
SUT SILK 3-0 18XBRD TIE 12 (SUTURE) ×1 IMPLANT
SUT SILK 4 0 (SUTURE) ×2
SUT SILK 4-0 18XBRD TIE 12 (SUTURE) ×1 IMPLANT
SUT VIC AB 3-0 SH 27 (SUTURE) ×4
SUT VIC AB 3-0 SH 27X BRD (SUTURE) ×2 IMPLANT
SYR 20CC LL (SYRINGE) ×3 IMPLANT
SYR 3ML LL SCALE MARK (SYRINGE) ×3 IMPLANT
SYR TB 1ML 27GX1/2 LL (SYRINGE) IMPLANT
TOWEL OR 17X26 4PK STRL BLUE (TOWEL DISPOSABLE) IMPLANT

## 2017-12-17 NOTE — Anesthesia Preprocedure Evaluation (Signed)
Anesthesia Evaluation  Patient identified by MRN, date of birth, ID band Patient awake    Reviewed: Allergy & Precautions, NPO status , Patient's Chart, lab work & pertinent test results  History of Anesthesia Complications Negative for: history of anesthetic complications  Airway Mallampati: II  TM Distance: >3 FB Neck ROM: Full    Dental  (+) Edentulous Upper, Edentulous Lower   Pulmonary neg sleep apnea, neg COPD, former smoker,    breath sounds clear to auscultation- rhonchi (-) wheezing      Cardiovascular hypertension, Pt. on medications + CAD and + Past MI (medical management)  (-) Cardiac Stents and (-) CABG  Rhythm:Regular Rate:Normal - Systolic murmurs and - Diastolic murmurs    Neuro/Psych negative neurological ROS  negative psych ROS   GI/Hepatic Neg liver ROS, GERD  ,  Endo/Other  negative endocrine ROSneg diabetes  Renal/GU CRFRenal disease (not yet on dialysis)     Musculoskeletal  (+) Arthritis ,   Abdominal (+) - obese,   Peds  Hematology  (+) anemia ,   Anesthesia Other Findings Past Medical History: No date: Allergy No date: Arthritis No date: Chronic kidney disease     Comment:  CKD V No date: GERD (gastroesophageal reflux disease) No date: Hypertension 2002: Lupus ([place]) No date: Macular degeneration of left eye 2007: Myocardial infarction ([place]) No date: Osteoporosis   Reproductive/Obstetrics                             Anesthesia Physical Anesthesia Plan  ASA: III  Anesthesia Plan: General   Post-op Pain Management:  Regional for Post-op pain   Induction: Intravenous  PONV Risk Score and Plan: 2 and Propofol infusion  Airway Management Planned: Natural Airway  Additional Equipment:   Intra-op Plan:   Post-operative Plan:   Informed Consent: I have reviewed the patients History and Physical, chart, labs and discussed the procedure including the  risks, benefits and alternatives for the proposed anesthesia with the patient or authorized representative who has indicated his/her understanding and acceptance.   Dental advisory given  Plan Discussed with: CRNA and Anesthesiologist  Anesthesia Plan Comments:         Anesthesia Quick Evaluation

## 2017-12-17 NOTE — Anesthesia Procedure Notes (Signed)
Anesthesia Regional Block: Supraclavicular block   Pre-Anesthetic Checklist: ,, timeout performed, Correct Patient, Correct Site, Correct Laterality, Correct Procedure, Correct Position, site marked, Risks and benefits discussed,  Surgical consent,  Pre-op evaluation,  At surgeon's request and post-op pain management  Laterality: Right  Prep: chloraprep       Needles:  Injection technique: Single-shot  Needle Type: Stimiplex     Needle Length: 10cm  Needle Gauge: 21     Additional Needles:   Procedures:,,,, ultrasound used (permanent image in chart),,,,  Narrative:  Start time: 12/17/2017 11:40 AM End time: 12/17/2017 11:50 AM Injection made incrementally with aspirations every 5 mL.  Performed by: Personally  Anesthesiologist: Emmie Niemann, MD  Additional Notes: Functioning IV was confirmed and monitors were applied.  A Stimuplex needle was used. Sterile prep and drape,hand hygiene and sterile gloves were used.  Negative aspiration and negative test dose prior to incremental administration of local anesthetic. The patient tolerated the procedure well.  Supplemental intercostal brachial block also performed.

## 2017-12-17 NOTE — Transfer of Care (Signed)
Immediate Anesthesia Transfer of Care Note  Patient: Marissa Hess  Procedure(s) Performed: ARTERIOVENOUS (AV) FISTULA CREATION ( BRACHIO CEPHALIC POSS. BRACHIO BASILIC ) (Right )  Patient Location: PACU  Anesthesia Type:General and Regional  Level of Consciousness: awake, alert , oriented and patient cooperative  Airway & Oxygen Therapy: Patient Spontanous Breathing and Patient connected to face mask oxygen  Post-op Assessment: Report given to RN and Post -op Vital signs reviewed and stable  Post vital signs: Reviewed and stable  Last Vitals:  Vitals Value Taken Time  BP    Temp    Pulse 69 12/17/2017  1:27 PM  Resp 19 12/17/2017  1:27 PM  SpO2 99 % 12/17/2017  1:27 PM  Vitals shown include unvalidated device data.  Last Pain:  Vitals:   12/17/17 1047  TempSrc: Temporal  PainSc: 0-No pain         Complications: No apparent anesthesia complications

## 2017-12-17 NOTE — Op Note (Signed)
[facility]   OPERATIVE NOTE   PROCEDURE: Right brachiocephalic arteriovenous fistula placement  PRE-OPERATIVE DIAGNOSIS: 1.  CKD stage 5 nearing dialysis dependence  POST-OPERATIVE DIAGNOSIS: 1. same  SURGEON: Leotis Pain, MD  ASSISTANT(S): none  ANESTHESIA: general  ESTIMATED BLOOD LOSS: 20 cc  FINDING(S): Adequate cephalic vein for fistula creation  SPECIMEN(S):  none  INDICATIONS:   Marissa Hess is a 82 y.o. female who presents with renal failure in need of pemanent dialysis acces.  The patient is scheduled for right arm AVF placement.  The patient is aware the risks include but are not limited to: bleeding, infection, steal syndrome, nerve damage, ischemic monomelic neuropathy, failure to mature, and need for additional procedures.  The patient is aware of the risks of the procedure and elects to proceed forward.  DESCRIPTION: After full informed written consent was obtained from the patient, the patient was brought back to the operating room and placed supine upon the operating table.  Prior to induction, the patient received IV antibiotics.   After obtaining adequate anesthesia, the patient was then prepped and draped in the standard fashion for a right arm access procedure.  I made a curvilinear incision at the level of the antecubital fossa and dissected through the subcutaneous tissue and fascia to gain exposure of the brachial artery.  This was noted to be patent and adequate in size for fistula creation.  This was dissected out proximally and distally and prepared for control with vessel loops .  I then dissected out the cephalic vein.  This was noted to be patent and adequate in size for fistula creation.  I then gave the patient 3000 units of intravenous heparin.  The vein was marked for orientation and the distal segment of the vein was ligated with a  2-0 silk, and the vein was transected.  I then instilled the heparinized saline into the vein and  clamped it.  At this point, I reset my exposure of the brachial artery and pulled up control on the vessel loops.  I made an arteriotomy with a #11 blade, and then I extended the arteriotomy with a Potts scissor.  I injected heparinized saline proximal and distal to this arteriotomy.  The vein was then sewn to the artery in an end-to-side configuration with a running stitch of 6-0 Prolene.  Prior to completing this anastomosis, I allowed the vein and artery to backbleed.  There was no evidence of clot from any vessels.  I completed the anastomosis in the usual fashion and then released all vessel loops and clamps.  There was a palpable  thrill in the venous outflow, and there was a palpable pulse in the artery distal to the anastomosis.  At this point, I irrigated out the surgical wound.  Surgicel was placed. There was no further active bleeding.  The subcutaneous tissue was reapproximated with a running stitch of 3-0 Vicryl.  The skin was then closed with a 4-0 Monocryl suture.  The skin was then cleaned, dried, and reinforced with Dermabond.  The patient tolerated this procedure well and was taken to the recovery room in stable condition  COMPLICATIONS: None  CONDITION: Stable   Leotis Pain    12/17/2017, 1:21 PM  This note was created with Dragon Medical transcription system. Any errors in dictation are purely unintentional.

## 2017-12-17 NOTE — H&P (Signed)
[facility] History & Physical Update  The patient was interviewed and re-examined.  The patient's previous History and Physical has been reviewed and is unchanged.  There is no change in the plan of care. We plan to proceed with the scheduled procedure.  Leotis Pain, MD  12/17/2017, 11:50 AM

## 2017-12-17 NOTE — Discharge Instructions (Signed)

## 2017-12-17 NOTE — Anesthesia Postprocedure Evaluation (Signed)
Anesthesia Post Note  Patient: Marissa Hess  Procedure(s) Performed: ARTERIOVENOUS (AV) FISTULA CREATION ( BRACHIO CEPHALIC POSS. BRACHIO BASILIC ) (Right )  Patient location during evaluation: PACU Anesthesia Type: Regional and General Level of consciousness: awake and alert and oriented Pain management: pain level controlled Vital Signs Assessment: post-procedure vital signs reviewed and stable Respiratory status: spontaneous breathing, nonlabored ventilation and respiratory function stable Cardiovascular status: blood pressure returned to baseline and stable Postop Assessment: no signs of nausea or vomiting Anesthetic complications: no     Last Vitals:  Vitals:   12/17/17 1329 12/17/17 1344  BP: 135/71 136/71  Pulse: 66 62  Resp: 19 19  Temp: (!) 36.1 C   SpO2: 100% 100%    Last Pain:  Vitals:   12/17/17 1344  TempSrc:   PainSc: 0-No pain                 Florenda Watt

## 2017-12-17 NOTE — Anesthesia Post-op Follow-up Note (Signed)
Anesthesia QCDR form completed.        

## 2017-12-17 NOTE — Anesthesia Procedure Notes (Signed)
Procedure Name: MAC Date/Time: 12/17/2017 12:12 PM Performed by: Gentry Fitz, CRNA Pre-anesthesia Checklist: Patient identified, Suction available, Emergency Drugs available and Patient being monitored Patient Re-evaluated:Patient Re-evaluated prior to induction Oxygen Delivery Method: Simple face mask Preoxygenation: Pre-oxygenation with 100% oxygen

## 2017-12-18 ENCOUNTER — Encounter: Payer: Self-pay | Admitting: Vascular Surgery

## 2017-12-25 ENCOUNTER — Telehealth (INDEPENDENT_AMBULATORY_CARE_PROVIDER_SITE_OTHER): Payer: Self-pay

## 2017-12-25 NOTE — Telephone Encounter (Signed)
Some numbness after creation of a dialysis access is normal. Any worsening pain to the hand / arm, pallor or ulcer formation to the hand / fingers would be concerning. The patient / dialysis staff should be checking for a bruit and thrill on a daily basis.

## 2017-12-25 NOTE — Telephone Encounter (Signed)
Patient called stating that she feel as if pins are sticking in her arm from her surgery from 12/17/17.

## 2017-12-25 NOTE — Telephone Encounter (Signed)
Spoke with the patient and gave her the recommendations from the PA and encouraged her to make an appt to see her Nephrologist as well. The patient does have an appt in our office in November with Eulogio Ditch NP.

## 2017-12-26 ENCOUNTER — Inpatient Hospital Stay: Payer: Medicare Other | Attending: Internal Medicine

## 2017-12-26 VITALS — BP 120/72 | HR 75 | Temp 97.3°F | Resp 17

## 2017-12-26 DIAGNOSIS — N184 Chronic kidney disease, stage 4 (severe): Secondary | ICD-10-CM | POA: Insufficient documentation

## 2017-12-26 DIAGNOSIS — D631 Anemia in chronic kidney disease: Secondary | ICD-10-CM | POA: Diagnosis not present

## 2017-12-26 MED ORDER — SODIUM CHLORIDE 0.9 % IV SOLN
Freq: Once | INTRAVENOUS | Status: AC
  Administered 2017-12-26: 14:00:00 via INTRAVENOUS
  Filled 2017-12-26: qty 250

## 2017-12-26 MED ORDER — IRON SUCROSE 20 MG/ML IV SOLN
200.0000 mg | Freq: Once | INTRAVENOUS | Status: AC
  Administered 2017-12-26: 200 mg via INTRAVENOUS
  Filled 2017-12-26: qty 10

## 2018-01-07 ENCOUNTER — Encounter: Payer: Self-pay | Admitting: Family Medicine

## 2018-01-07 ENCOUNTER — Ambulatory Visit (INDEPENDENT_AMBULATORY_CARE_PROVIDER_SITE_OTHER): Payer: Medicare Other | Admitting: Family Medicine

## 2018-01-07 DIAGNOSIS — D631 Anemia in chronic kidney disease: Secondary | ICD-10-CM

## 2018-01-07 DIAGNOSIS — I129 Hypertensive chronic kidney disease with stage 1 through stage 4 chronic kidney disease, or unspecified chronic kidney disease: Secondary | ICD-10-CM

## 2018-01-07 DIAGNOSIS — R634 Abnormal weight loss: Secondary | ICD-10-CM

## 2018-01-07 DIAGNOSIS — N184 Chronic kidney disease, stage 4 (severe): Secondary | ICD-10-CM | POA: Diagnosis not present

## 2018-01-07 NOTE — Assessment & Plan Note (Signed)
Followed by nephrology and hematology with iron infusions

## 2018-01-07 NOTE — Assessment & Plan Note (Signed)
Stable has nephrology visit next week

## 2018-01-07 NOTE — Progress Notes (Signed)
BP 110/65 (BP Location: Left Arm, Patient Position: Sitting, Cuff Size: Normal)   Pulse 74   Temp 97.7 F (36.5 C) (Oral)   Ht 5\' 3"  (1.6 m)   Wt 126 lb 11.2 oz (57.5 kg)   SpO2 96%   BMI 22.44 kg/m    Subjective:    Patient ID: Marissa Hess, female    DOB: 06-30-1934, 83 y.o.   MRN: [id number]  HPI: Marissa Hess is a 83 y.o. female  Chief Complaint  Patient presents with  . Arm Pain    Left arm had a knot under it. Knot is now gone but it is still very sore.    Patient with left arm upper arm area posterior surface area was indurated and feeling full after iron infusion therapy.  This has since resolved with some residual soreness and tenderness in the arm but no further mass type sensation or feeling. Patient also concerned about weight loss and lack of appetite. Recovering from stent placement surgery on her right arm. Getting iron infusion therapy for iron deficiency anemia plus patient has anemia of CKD. Patient going to nephrology next week to follow-up and getting ready for dialysis. Patient trying to eat but just does not have an appetite.  Relevant past medical, surgical, family and social history reviewed and updated as indicated. Interim medical history since our last visit reviewed. Allergies and medications reviewed and updated.  Review of Systems  Constitutional: Negative.   Respiratory: Negative.   Cardiovascular: Negative.     Per HPI unless specifically indicated above     Objective:    BP 110/65 (BP Location: Left Arm, Patient Position: Sitting, Cuff Size: Normal)   Pulse 74   Temp 97.7 F (36.5 C) (Oral)   Ht 5\' 3"  (1.6 m)   Wt 126 lb 11.2 oz (57.5 kg)   SpO2 96%   BMI 22.44 kg/m   Wt Readings from Last 3 Encounters:  01/07/18 126 lb 11.2 oz (57.5 kg)  12/17/17 132 lb (59.9 kg)  12/11/17 132 lb (59.9 kg)    Physical Exam  Constitutional: She is oriented to person, place, and time. She appears well-developed and well-nourished.    HENT:  Head: Normocephalic and atraumatic.  Eyes: Conjunctivae and EOM are normal.  Neck: Normal range of motion.  Cardiovascular: Normal rate, regular rhythm and normal heart sounds.  Pulmonary/Chest: Effort normal and breath sounds normal.  Musculoskeletal: Normal range of motion.  Left upper arm palpated no lesions noticed  Neurological: She is alert and oriented to person, place, and time.  Skin: No erythema.  Psychiatric: She has a normal mood and affect. Her behavior is normal. Judgment and thought content normal.    Results for orders placed or performed during the hospital encounter of 12/17/17  ABO/Rh  Result Value Ref Range   ABO/RH(D)      O POS Performed at [facility], [address] [place], [place] [address]       Assessment & Plan:   Problem List Items Addressed This Visit      Genitourinary   Hypertensive nephropathy    Stable has nephrology visit next week      Anemia of chronic kidney failure, stage 4 (severe) ([place])    Followed by nephrology and hematology with iron infusions        Other   Weight loss    Discussed mixed reasons for weight loss diet nutrition therapy.  Follow up plan: Return in about 3 months (around 04/09/2018) for med and wt check.

## 2018-01-07 NOTE — Assessment & Plan Note (Signed)
Discussed mixed reasons for weight loss diet nutrition therapy.

## 2018-01-12 DIAGNOSIS — R809 Proteinuria, unspecified: Secondary | ICD-10-CM | POA: Diagnosis not present

## 2018-01-12 DIAGNOSIS — D631 Anemia in chronic kidney disease: Secondary | ICD-10-CM | POA: Diagnosis not present

## 2018-01-12 DIAGNOSIS — N185 Chronic kidney disease, stage 5: Secondary | ICD-10-CM | POA: Diagnosis not present

## 2018-01-12 DIAGNOSIS — N2581 Secondary hyperparathyroidism of renal origin: Secondary | ICD-10-CM | POA: Diagnosis not present

## 2018-01-12 DIAGNOSIS — I10 Essential (primary) hypertension: Secondary | ICD-10-CM | POA: Diagnosis not present

## 2018-01-13 ENCOUNTER — Emergency Department: Payer: Medicare Other

## 2018-01-13 ENCOUNTER — Observation Stay
Admission: EM | Admit: 2018-01-13 | Discharge: 2018-01-14 | Disposition: A | Discharge status: Home / Self Care | Payer: Medicare Other | Attending: Specialist | Admitting: Specialist

## 2018-01-13 ENCOUNTER — Encounter: Payer: Self-pay | Admitting: Emergency Medicine

## 2018-01-13 ENCOUNTER — Other Ambulatory Visit: Payer: Self-pay

## 2018-01-13 ENCOUNTER — Encounter (INDEPENDENT_AMBULATORY_CARE_PROVIDER_SITE_OTHER): Payer: Self-pay

## 2018-01-13 DIAGNOSIS — I120 Hypertensive chronic kidney disease with stage 5 chronic kidney disease or end stage renal disease: Secondary | ICD-10-CM | POA: Insufficient documentation

## 2018-01-13 DIAGNOSIS — Z886 Allergy status to analgesic agent status: Secondary | ICD-10-CM | POA: Diagnosis not present

## 2018-01-13 DIAGNOSIS — M1990 Unspecified osteoarthritis, unspecified site: Secondary | ICD-10-CM | POA: Insufficient documentation

## 2018-01-13 DIAGNOSIS — I10 Essential (primary) hypertension: Secondary | ICD-10-CM | POA: Diagnosis not present

## 2018-01-13 DIAGNOSIS — Z882 Allergy status to sulfonamides status: Secondary | ICD-10-CM | POA: Diagnosis not present

## 2018-01-13 DIAGNOSIS — Z880 Allergy status to penicillin: Secondary | ICD-10-CM | POA: Insufficient documentation

## 2018-01-13 DIAGNOSIS — J168 Pneumonia due to other specified infectious organisms: Secondary | ICD-10-CM | POA: Diagnosis not present

## 2018-01-13 DIAGNOSIS — J690 Pneumonitis due to inhalation of food and vomit: Secondary | ICD-10-CM | POA: Diagnosis present

## 2018-01-13 DIAGNOSIS — R079 Chest pain, unspecified: Secondary | ICD-10-CM | POA: Diagnosis not present

## 2018-01-13 DIAGNOSIS — N2581 Secondary hyperparathyroidism of renal origin: Secondary | ICD-10-CM | POA: Insufficient documentation

## 2018-01-13 DIAGNOSIS — Z7982 Long term (current) use of aspirin: Secondary | ICD-10-CM | POA: Diagnosis not present

## 2018-01-13 DIAGNOSIS — Z79899 Other long term (current) drug therapy: Secondary | ICD-10-CM | POA: Insufficient documentation

## 2018-01-13 DIAGNOSIS — M3214 Glomerular disease in systemic lupus erythematosus: Secondary | ICD-10-CM | POA: Diagnosis not present

## 2018-01-13 DIAGNOSIS — R0602 Shortness of breath: Secondary | ICD-10-CM | POA: Diagnosis not present

## 2018-01-13 DIAGNOSIS — R9431 Abnormal electrocardiogram [ECG] [EKG]: Secondary | ICD-10-CM | POA: Diagnosis not present

## 2018-01-13 DIAGNOSIS — Z885 Allergy status to narcotic agent status: Secondary | ICD-10-CM | POA: Diagnosis not present

## 2018-01-13 DIAGNOSIS — K219 Gastro-esophageal reflux disease without esophagitis: Secondary | ICD-10-CM | POA: Diagnosis not present

## 2018-01-13 DIAGNOSIS — N185 Chronic kidney disease, stage 5: Secondary | ICD-10-CM | POA: Insufficient documentation

## 2018-01-13 DIAGNOSIS — Z888 Allergy status to other drugs, medicaments and biological substances status: Secondary | ICD-10-CM | POA: Insufficient documentation

## 2018-01-13 DIAGNOSIS — I252 Old myocardial infarction: Secondary | ICD-10-CM | POA: Insufficient documentation

## 2018-01-13 DIAGNOSIS — J189 Pneumonia, unspecified organism: Secondary | ICD-10-CM | POA: Diagnosis not present

## 2018-01-13 DIAGNOSIS — Z87891 Personal history of nicotine dependence: Secondary | ICD-10-CM | POA: Insufficient documentation

## 2018-01-13 DIAGNOSIS — M810 Age-related osteoporosis without current pathological fracture: Secondary | ICD-10-CM | POA: Insufficient documentation

## 2018-01-13 LAB — BASIC METABOLIC PANEL
ANION GAP: 9 (ref 5–15)
BUN: 51 mg/dL — ABNORMAL HIGH (ref 8–23)
CHLORIDE: 105 mmol/L (ref 98–111)
CO2: 23 mmol/L (ref 22–32)
Calcium: 11.3 mg/dL — ABNORMAL HIGH (ref 8.9–10.3)
Creatinine, Ser: 5.15 mg/dL — ABNORMAL HIGH (ref 0.44–1.00)
GFR calc non Af Amer: 7 mL/min — ABNORMAL LOW (ref >60)
GFR, EST AFRICAN AMERICAN: 8 mL/min — AB (ref >60)
GLUCOSE: 115 mg/dL — AB (ref 70–99)
POTASSIUM: 5 mmol/L (ref 3.5–5.1)
Sodium: 137 mmol/L (ref 135–145)

## 2018-01-13 LAB — CBC
HCT: 30.1 % — ABNORMAL LOW (ref 36.0–46.0)
HEMOGLOBIN: 9.2 g/dL — AB (ref 12.0–15.0)
MCH: 26.9 pg (ref 26.0–34.0)
MCHC: 30.6 g/dL (ref 30.0–36.0)
MCV: 88 fL (ref 80.0–100.0)
NRBC: 0 % (ref 0.0–0.2)
PLATELETS: 268 10*3/uL (ref 150–400)
RBC: 3.42 MIL/uL — AB (ref 3.87–5.11)
RDW: 16.1 % — ABNORMAL HIGH (ref 11.5–15.5)
WBC: 9.9 10*3/uL (ref 4.0–10.5)

## 2018-01-13 LAB — LACTIC ACID, PLASMA: Lactic Acid, Venous: 1.1 mmol/L (ref 0.5–1.9)

## 2018-01-13 LAB — TROPONIN I: Troponin I: <0.03 ng/mL (ref <0.03)

## 2018-01-13 MED ORDER — POLYSACCHARIDE IRON COMPLEX 150 MG PO CAPS
150.0000 mg | ORAL_CAPSULE | Freq: Every day | ORAL | Status: DC
  Administered 2018-01-14: 150 mg via ORAL
  Filled 2018-01-13: qty 1

## 2018-01-13 MED ORDER — FERROUS SULFATE DRIED ER 160 (50 FE) MG PO TBCR: 1.0000 | EXTENDED_RELEASE_TABLET | Freq: Every day | ORAL | Status: DC

## 2018-01-13 MED ORDER — FOLIC ACID 1 MG PO TABS
1.0000 mg | ORAL_TABLET | Freq: Every day | ORAL | Status: DC
  Administered 2018-01-14: 1 mg via ORAL
  Filled 2018-01-13: qty 1

## 2018-01-13 MED ORDER — ONDANSETRON HCL 4 MG/2ML IJ SOLN: 4.0000 mg | Freq: Four times a day (QID) | INTRAMUSCULAR | Status: DC | PRN

## 2018-01-13 MED ORDER — DEXTROSE 5 % IV SOLN
250.0000 mg | INTRAVENOUS | Status: DC
  Filled 2018-01-13: qty 250

## 2018-01-13 MED ORDER — SODIUM CHLORIDE 0.9 % IV SOLN
2.0000 g | Freq: Once | INTRAVENOUS | Status: AC
  Administered 2018-01-13: 2 g via INTRAVENOUS
  Filled 2018-01-13: qty 20

## 2018-01-13 MED ORDER — HEPARIN SODIUM (PORCINE) 5000 UNIT/ML IJ SOLN
5000.0000 [IU] | Freq: Three times a day (TID) | INTRAMUSCULAR | Status: DC
  Administered 2018-01-14: 5000 [IU] via SUBCUTANEOUS
  Filled 2018-01-13: qty 1

## 2018-01-13 MED ORDER — TRAMADOL HCL 50 MG PO TABS
50.0000 mg | ORAL_TABLET | Freq: Four times a day (QID) | ORAL | Status: DC | PRN
  Administered 2018-01-13 – 2018-01-14 (×2): 50 mg via ORAL
  Filled 2018-01-13 (×2): qty 1

## 2018-01-13 MED ORDER — ONDANSETRON HCL 4 MG PO TABS: 4.0000 mg | ORAL_TABLET | Freq: Four times a day (QID) | ORAL | Status: DC | PRN

## 2018-01-13 MED ORDER — FUROSEMIDE 20 MG PO TABS
20.0000 mg | ORAL_TABLET | Freq: Every morning | ORAL | Status: DC
  Administered 2018-01-14: 20 mg via ORAL
  Filled 2018-01-13: qty 1

## 2018-01-13 MED ORDER — CALCITRIOL 0.25 MCG PO CAPS
0.5000 ug | ORAL_CAPSULE | Freq: Every day | ORAL | Status: DC
  Administered 2018-01-14: 0.5 ug via ORAL
  Filled 2018-01-13: qty 2

## 2018-01-13 MED ORDER — SODIUM BICARBONATE 650 MG PO TABS
1300.0000 mg | ORAL_TABLET | Freq: Two times a day (BID) | ORAL | Status: DC
  Administered 2018-01-13 – 2018-01-14 (×2): 1300 mg via ORAL
  Filled 2018-01-13 (×2): qty 2

## 2018-01-13 MED ORDER — ACETAMINOPHEN 325 MG PO TABS: 650.0000 mg | ORAL_TABLET | Freq: Four times a day (QID) | ORAL | Status: DC | PRN

## 2018-01-13 MED ORDER — CARVEDILOL 25 MG PO TABS
25.0000 mg | ORAL_TABLET | Freq: Two times a day (BID) | ORAL | Status: DC
  Administered 2018-01-13 – 2018-01-14 (×2): 25 mg via ORAL
  Filled 2018-01-13: qty 1
  Filled 2018-01-13: qty 2

## 2018-01-13 MED ORDER — SODIUM CHLORIDE 0.9 % IV SOLN
1.0000 g | INTRAVENOUS | Status: DC
  Filled 2018-01-13: qty 10

## 2018-01-13 MED ORDER — ASPIRIN EC 81 MG PO TBEC
81.0000 mg | DELAYED_RELEASE_TABLET | Freq: Every day | ORAL | Status: DC
  Administered 2018-01-14: 81 mg via ORAL
  Filled 2018-01-13: qty 1

## 2018-01-13 MED ORDER — VITAMIN D3 25 MCG (1000 UNIT) PO TABS
1000.0000 [IU] | ORAL_TABLET | Freq: Every day | ORAL | Status: DC
  Administered 2018-01-14: 1000 [IU] via ORAL
  Filled 2018-01-13: qty 1

## 2018-01-13 MED ORDER — ACETAMINOPHEN 650 MG RE SUPP: 650.0000 mg | Freq: Four times a day (QID) | RECTAL | Status: DC | PRN

## 2018-01-13 MED ORDER — SODIUM CHLORIDE 0.9 % IV SOLN
500.0000 mg | Freq: Once | INTRAVENOUS | Status: AC
  Administered 2018-01-13: 500 mg via INTRAVENOUS
  Filled 2018-01-13: qty 500

## 2018-01-13 NOTE — H&P (Signed)
[facility] [place] NAME: Marissa Hess    MR#:  [id number]  DATE OF BIRTH:  [birth year]  DATE OF ADMISSION:  01/13/2018  PRIMARY CARE PHYSICIAN: Guadalupe Maple, MD   REQUESTING/REFERRING PHYSICIAN: Dr. Nance Pear.   CHIEF COMPLAINT:   Chief Complaint  Patient presents with  . Chest Pain    HISTORY OF PRESENT ILLNESS:  Marissa Hess  is a 83 y.o. female with a known history of CKD stage V, GERD, hypertension, lupus, osteoarthritis, osteoporosis, previous history of MI who presents to the hospital complaining of left-sided chest pain.  Patient says she was in her usual state of health when she developed left lower chest pain.  Its located underneath her left breast nonradiating and associated with some mild shortness of breath and cough which has been nonproductive.  She denies any fevers chills or sick contacts.  She presents to the ER underwent chest x-ray which showed a possible left lower lobe pneumonia.  Hospitalist services therefore contacted for admission.  Patient denies any abdominal pain, melena, hematochezia, chills, weight loss or any other associated symptoms presently.  PAST MEDICAL HISTORY:   Past Medical History:  Diagnosis Date  . Allergy   . Arthritis   . Chronic kidney disease    CKD V  . GERD (gastroesophageal reflux disease)   . Hypertension   . Lupus ([place]) 2002  . Macular degeneration of left eye   . Myocardial infarction ([place]) 2007  . Osteoporosis     PAST SURGICAL HISTORY:   Past Surgical History:  Procedure Laterality Date  . ABDOMINAL HYSTERECTOMY    . AV FISTULA PLACEMENT Right 12/17/2017   Procedure: ARTERIOVENOUS (AV) FISTULA CREATION ( BRACHIO CEPHALIC POSS. BRACHIO BASILIC );  Surgeon: Algernon Huxley, MD;  Location: [facility];  Service: Vascular;  Laterality: Right;  . CATARACT EXTRACTION W/PHACO Right 01/03/2016   Procedure: CATARACT EXTRACTION PHACO AND INTRAOCULAR LENS PLACEMENT ([place]);   Surgeon: Leandrew Koyanagi, MD;  Location: [facility];  Service: Ophthalmology;  Laterality: Right;  . COLON SURGERY     intestine became twisted after hernia sugery  . EYE SURGERY     cataract  . HERNIA REPAIR    . TEMPORAL ARTERY BIOPSY / LIGATION    . TONSILLECTOMY      SOCIAL HISTORY:   Social History   Tobacco Use  . Smoking status: Former Smoker    Types: Cigarettes    Start date: 09/11/1984    Last attempt to quit: 09/26/1994    Years since quitting: 23.3  . Smokeless tobacco: Never Used  Substance Use Topics  . Alcohol use: No    Alcohol/week: 0.0 standard drinks    FAMILY HISTORY:   Family History  Problem Relation Age of Onset  . Stomach cancer Mother   . Rheum arthritis Father   . Breast cancer Neg Hx     DRUG ALLERGIES:   Allergies  Allergen Reactions  . Codeine Itching  . Ibuprofen     Avoids "because of lupus"  . Sulfa Antibiotics Diarrhea  . Vioxx [Rofecoxib]     Avoids "because of lupus"  . Celebrex [Celecoxib] Rash    Avoids "because of lupus"  . Penicillins Rash    REVIEW OF SYSTEMS:   Review of Systems  Constitutional: Negative for fever and weight loss.  HENT: Negative for congestion, nosebleeds and tinnitus.   Eyes: Negative for blurred vision, double vision and redness.  Respiratory: Positive for shortness  of breath. Negative for cough and hemoptysis.   Cardiovascular: Positive for chest pain. Negative for orthopnea, leg swelling and PND.  Gastrointestinal: Negative for abdominal pain, diarrhea, melena, nausea and vomiting.  Genitourinary: Negative for dysuria, hematuria and urgency.  Musculoskeletal: Negative for falls and joint pain.  Neurological: Negative for dizziness, tingling, sensory change, focal weakness, seizures, weakness and headaches.  Endo/Heme/Allergies: Negative for polydipsia. Does not bruise/bleed easily.  Psychiatric/Behavioral: Negative for depression and memory loss. The patient is not nervous/anxious.      MEDICATIONS AT HOME:   Prior to Admission medications   Medication Sig Start Date End Date Taking? Authorizing Provider  aspirin EC 81 MG tablet Take 81 mg by mouth daily.    [provider]  calcitRIOL (ROCALTROL) 0.5 MCG capsule Take 0.5 mcg by mouth daily.  08/22/17   [provider]  carvedilol (COREG) 25 MG tablet Take 1 tablet (25 mg total) by mouth 2 (two) times daily with a meal. 10/08/17   Crissman, Jeannette How, MD  Cholecalciferol (VITAMIN D PO) Take 1 tablet by mouth daily.    [provider]  ferrous sulfate (SLOW RELEASE IRON) 160 (50 Fe) MG TBCR SR tablet Take by mouth daily.    [provider]  folic acid (FOLVITE) 1 MG tablet Take 1 tablet (1 mg total) by mouth daily. 10/08/17   Guadalupe Maple, MD  furosemide (LASIX) 20 MG tablet Take 1 tablet (20 mg total) by mouth every morning. 10/08/17   Guadalupe Maple, MD  sodium bicarbonate 650 MG tablet Take 1,300 mg by mouth 2 (two) times daily.    [provider]  traMADol (ULTRAM) 50 MG tablet Take 1 tablet (50 mg total) by mouth every 6 (six) hours as needed. 12/17/17   Algernon Huxley, MD      VITAL SIGNS:  Blood pressure (!) 148/81, pulse 86, temperature 98.2 F (36.8 C), temperature source Oral, resp. rate 18, height 5\' 3"  (1.6 m), weight 57.4 kg, SpO2 100 %.  PHYSICAL EXAMINATION:  Physical Exam  GENERAL:  83 y.o.-year-old patient lying in the bed in no acute distress.  EYES: Pupils equal, round, reactive to light and accommodation. No scleral icterus. Extraocular muscles intact.  HEENT: Head atraumatic, normocephalic. Oropharynx and nasopharynx clear. No oropharyngeal erythema, moist oral mucosa  NECK:  Supple, no jugular venous distention. No thyroid enlargement, no tenderness.  LUNGS: Poor Resp. Effort, no wheezing, rales, rhonchi. No use of accessory muscles of respiration.  CARDIOVASCULAR: S1, S2 RRR. No murmurs, rubs, gallops, clicks.  ABDOMEN: Soft, nontender, nondistended.  Bowel sounds present. No organomegaly or mass.  EXTREMITIES: No pedal edema, cyanosis, or clubbing. + 2 pedal & radial pulses b/l.   NEUROLOGIC: Cranial nerves II through XII are intact. No focal Motor or sensory deficits appreciated b/l. Globally weak.  PSYCHIATRIC: The patient is alert and oriented x 3.   SKIN: No obvious rash, lesion, or ulcer.   Right upper Ext AV fistula.   LABORATORY PANEL:   CBC Recent Labs  Lab 01/13/18 1216  WBC 9.9  HGB 9.2*  HCT 30.1*  PLT 268   ------------------------------------------------------------------------------------------------------------------  Chemistries  Recent Labs  Lab 01/13/18 1216  NA 137  K 5.0  CL 105  CO2 23  GLUCOSE 115*  BUN 51*  CREATININE 5.15*  CALCIUM 11.3*   ------------------------------------------------------------------------------------------------------------------  Cardiac Enzymes Recent Labs  Lab 01/13/18 1216  TROPONINI <0.03   ------------------------------------------------------------------------------------------------------------------  RADIOLOGY:  Dg Chest 2 View  Result Date: 01/13/2018 CLINICAL DATA:  Chest pain and shortness of breath since yesterday EXAM: CHEST - 2 VIEW COMPARISON:  January 21, 2011 FINDINGS: The heart size and mediastinal contours are within normal limits. The aorta is tortuous. Patchy consolidation of left lung base is identified. There is no pulmonary edema or pleural effusion. The lungs are hyperinflated. The visualized skeletal structures are stable. IMPRESSION: Patchy consolidation of left lung base, suspicious for pneumonia. Electronically Signed   By: Abelardo Diesel M.D.   On: 01/13/2018 14:15     IMPRESSION AND PLAN:   83 year old female with past medical history of chronic kidney disease stage V, hypertension, history of lupus, osteoarthritis, osteoporosis who presents to the hospital due to chest pain.  1.  Chest pain- suspected to be secondary to  underlying pneumonia. - We will treat underlying pneumonia with IV ceftriaxone, Zithromax.  Treat underlying chest pain with as needed tramadol.  2.  Pneumonia-suspected to be community-acquired pneumonia-we will treat patient with IV ceftriaxone, Zithromax.  Follow cultures.  3.  CKD stage V- patient's creatinine has been slowly getting worse.  She has a right upper extremity AV fistula, patient follows with Dr. Holley Raring.  No acute need for hemodialysis presently.  If needed consider nephrology consult but not urgently needed. -Continue sodium bicarbonate.  4.  Essential hypertension-continue carvedilol.  5.  Secondary hyperparathyroidism-continue Rocaltrol.    All the records are reviewed and case discussed with ED provider. Management plans discussed with the patient, family and they are in agreement.  CODE STATUS: Full code  TOTAL TIME TAKING CARE OF THIS PATIENT: 40 minutes.    Henreitta Leber M.D on 01/13/2018 at 5:28 PM  Between 7am to 6pm - Pager - [phone]  After 6pm go to [email or web address] - password [email or web address] [facility]  Office  [phone]  CC: Primary care physician; Guadalupe Maple, MD

## 2018-01-13 NOTE — ED Provider Notes (Signed)
[facility] Emergency Department Provider Note  ____________________________________________   I have reviewed the triage vital signs and the nursing notes.   HISTORY  Chief Complaint Chest Pain   History limited by: Not Limited   HPI Marissa Hess is a 83 y.o. female who presents to the emergency department today because of concerns for chest pain.  Patient states that the pain started yesterday.  Located in the left chest.  It has been constant since then.  She rates it an 8 out of 10 on the pain scale.  It is worse with cough.  The cough is not been productive.  She has had some associated nausea but no vomiting. The patient states she has had pneumonia in the past. She denies any fevers. States she is always cold.   Per medical record review patient has a history of chronic kidney disease, is being set up for dialysis.   Past Medical History:  Diagnosis Date  . Allergy   . Arthritis   . Chronic kidney disease    CKD V  . GERD (gastroesophageal reflux disease)   . Hypertension   . Lupus ([place]) 2002  . Macular degeneration of left eye   . Myocardial infarction ([place]) 2007  . Osteoporosis     Patient Active Problem List   Diagnosis Date Noted  . Weight loss 01/07/2018  . CKD (chronic kidney disease), stage V ([place]) 12/05/2017  . Anemia of chronic kidney failure, stage 4 (severe) ([place]) 11/10/2017  . Anemia associated with stage 4 chronic renal failure ([place]) 10/20/2017  . Advanced care planning/counseling discussion 09/25/2016  . Gout 10/16/2015  . Dysphagia 10/16/2015  . Claudication of both lower extremities ([place]) 10/16/2015  . Restless leg syndrome 09/26/2014  . Temporal arteritis ([place]) 08/19/2014  . 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 Pernicious anemia 08/19/2014  . Hypertensive nephropathy 08/19/2014  . Hyperlipemia 08/19/2014  . Atopic rhinitis 08/19/2014  . 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 Senile osteoporosis 08/19/2014   . Esophageal reflux 08/19/2014    Past Surgical History:  Procedure Laterality Date  . ABDOMINAL HYSTERECTOMY    . AV FISTULA PLACEMENT Right 12/17/2017   Procedure: ARTERIOVENOUS (AV) FISTULA CREATION ( BRACHIO CEPHALIC POSS. BRACHIO BASILIC );  Surgeon: Algernon Huxley, MD;  Location: [facility] ORS;  Service: Vascular;  Laterality: Right;  . CATARACT EXTRACTION W/PHACO Right 01/03/2016   Procedure: CATARACT EXTRACTION PHACO AND INTRAOCULAR LENS PLACEMENT ([place]);  Surgeon: Leandrew Koyanagi, MD;  Location: [facility];  Service: Ophthalmology;  Laterality: Right;  . COLON SURGERY     intestine became twisted after hernia sugery  . EYE SURGERY     cataract  . HERNIA REPAIR    . TEMPORAL ARTERY BIOPSY / LIGATION    . TONSILLECTOMY      Prior to Admission medications   Medication Sig Start Date End Date Taking? Authorizing Provider  aspirin EC 81 MG tablet Take 81 mg by mouth daily.    [provider]  calcitRIOL (ROCALTROL) 0.5 MCG capsule Take 0.5 mcg by mouth daily.  08/22/17   [provider]  carvedilol (COREG) 25 MG tablet Take 1 tablet (25 mg total) by mouth 2 (two) times daily with a meal. 10/08/17   Crissman, Jeannette How, MD  Cholecalciferol (VITAMIN D PO) Take 1 tablet by mouth daily.    [provider]  ferrous sulfate (SLOW RELEASE IRON) 160 (50 Fe) MG TBCR SR tablet Take by mouth daily.    [provider]  folic acid (FOLVITE) 1 MG tablet Take 1 tablet (1 mg total) by mouth daily. 10/08/17   Guadalupe Maple, MD  furosemide (LASIX) 20 MG tablet Take 1 tablet (20 mg total) by mouth every morning. 10/08/17   Guadalupe Maple, MD  sodium bicarbonate 650 MG tablet Take 1,300 mg by mouth 2 (two) times daily.    [provider]  traMADol (ULTRAM) 50 MG tablet Take 1 tablet (50 mg total) by mouth every 6 (six) hours as needed. 12/17/17   Algernon Huxley, MD    Allergies Codeine; Ibuprofen; Sulfa antibiotics; Vioxx [rofecoxib]; Celebrex  [celecoxib]; and Penicillins  Family History  Problem Relation Age of Onset  . Stomach cancer Mother   . Arthritis Father   . Breast cancer Neg Hx     Social History Social History   Tobacco Use  . Smoking status: Former Smoker    Types: Cigarettes    Start date: 09/11/1984    Last attempt to quit: 09/26/1994    Years since quitting: 23.3  . Smokeless tobacco: Never Used  Substance Use Topics  . Alcohol use: No    Alcohol/week: 0.0 standard drinks  . Drug use: No    Review of Systems Constitutional: No fever/chills Eyes: No visual changes. ENT: No sore throat. Cardiovascular: Positive for left lower chest pain. Respiratory: Denies shortness of breath. Positive for cough. Gastrointestinal: No abdominal pain.  No nausea, no vomiting.  No diarrhea.   Genitourinary: Negative for dysuria. Musculoskeletal: Negative for back pain. Skin: Negative for rash. Neurological: Negative for headaches, focal weakness or numbness.  ____________________________________________   PHYSICAL EXAM:  VITAL SIGNS: ED Triage Vitals  Enc Vitals Group     BP 01/13/18 1155 138/68     Pulse Rate 01/13/18 1155 78     Resp 01/13/18 1155 18     Temp 01/13/18 1155 97.8 F (36.6 C)     Temp Source 01/13/18 1155 Oral     SpO2 01/13/18 1542 100 %     Weight 01/13/18 1200 126 lb 8.7 oz (57.4 kg)     Height 01/13/18 1200 5\' 3"  (1.6 m)     Head Circumference --      Peak Flow --      Pain Score 01/13/18 1159 9   Constitutional: Alert and oriented.  Eyes: Conjunctivae are normal.  ENT      Head: Normocephalic and atraumatic.      Nose: No congestion/rhinnorhea.      Mouth/Throat: Mucous membranes are moist.      Neck: No stridor. Hematological/Lymphatic/Immunilogical: No cervical lymphadenopathy. Cardiovascular: Normal rate, regular rhythm.  No murmurs, rubs, or gallops.  Respiratory: Normal respiratory effort without tachypnea nor retractions. Breath sounds are clear and equal bilaterally. No  wheezes/rales/rhonchi. Gastrointestinal: Soft and non tender. No rebound. No guarding.  Genitourinary: Deferred Musculoskeletal: Normal range of motion in all extremities. No lower extremity edema. Neurologic:  Normal speech and language. No gross focal neurologic deficits are appreciated.  Skin:  Skin is warm, dry and intact. No rash noted. Psychiatric: Mood and affect are normal. Speech and behavior are normal. Patient exhibits appropriate insight and judgment.  ____________________________________________    LABS (pertinent positives/negatives)  CBC wbc 9.9, hgb 9.2, plt 268 Trop <0.03 BMP na 137, k 5.0, glu 115, bun 51, cr 5.15  ____________________________________________   EKG  I, Nance Pear, attending physician, personally viewed and interpreted this EKG  EKG Time: 1159 Rate: 81 Rhythm: normal sinus rhythm Axis: left axis deviation Intervals:  qtc 422 QRS: narrow, LVH ST changes: no st elevation Impression: abnormal ekg   ____________________________________________    RADIOLOGY  CXR Concern for left lower pneumonia  ____________________________________________   PROCEDURES  Procedures  ____________________________________________   INITIAL IMPRESSION / ASSESSMENT AND PLAN / ED COURSE  Pertinent labs & imaging results that were available during my care of the patient were reviewed by me and considered in my medical decision making (see chart for details).   Presented to the emergency department today because of concerns for left lower peer patient is also had cough.  Differential would be broad including ACS, pneumothorax, PE, pneumonia, costochondritis amongst other etiologies.  Patient's x-ray is concerning for pneumonia.  Patient would be moderate risk per pneumonia scoring.  At this point given renal failure do think patient would be best treated as inpatient with IV antibiotics.  Discussed findings and plan with the patient.  Confirmed with  patient that reaction to pneumonia was simply a rash and no respiratory complaint.  ____________________________________________   FINAL CLINICAL IMPRESSION(S) / ED DIAGNOSES  Final diagnoses:  Pneumonia due to infectious organism, unspecified laterality, unspecified part of lung     Note: This dictation was prepared with Dragon dictation. Any transcriptional errors that result from this process are unintentional     Nance Pear, MD 01/13/18 1656

## 2018-01-13 NOTE — ED Notes (Signed)
Marya Amsler RN, aware of bed assigned

## 2018-01-13 NOTE — ED Notes (Signed)
ED TO INPATIENT HANDOFF REPORT  Name/Age/Gender Marissa Hess 83 y.o. female  Code Status   Home/SNF/Other Home  Chief Complaint pain under l breast  Level of Care/Admitting Diagnosis ED Disposition    ED Disposition Condition [place]: [place] [100120]  Level of Care: Med-Surg [16]  Diagnosis: Pneumonia [227785]  Admitting Physician: Henreitta Leber [[id number]]  Attending Physician: Henreitta Leber [[id number]]  PT Class (Do Not Modify): Observation [104]  PT Acc Code (Do Not Modify): Observation [10022]       Medical History Past Medical History:  Diagnosis Date  . Allergy   . Arthritis   . Chronic kidney disease    CKD V  . GERD (gastroesophageal reflux disease)   . Hypertension   . Lupus ([place]) 2002  . Macular degeneration of left eye   . Myocardial infarction ([place]) 2007  . Osteoporosis     Allergies Allergies  Allergen Reactions  . Codeine Itching  . Ibuprofen     Avoids "because of lupus"  . Sulfa Antibiotics Diarrhea  . Vioxx [Rofecoxib]     Avoids "because of lupus"  . Celebrex [Celecoxib] Rash    Avoids "because of lupus"  . Penicillins Rash    IV Location/Drains/Wounds Patient Lines/Drains/Airways Status   Active Line/Drains/Airways    Name:   Placement date:   Placement time:   Site:   Days:   Peripheral IV 01/13/18 Left Arm   01/13/18    1744    Arm   less than 1   Fistula / Graft Right Forearm Arteriovenous fistula   12/17/17    1230    Forearm   27   Incision (Closed) 01/03/16 Eye Right   01/03/16    0956     741          Labs/Imaging Results for orders placed or performed during the hospital encounter of 01/13/18 (from the past 48 hour(s))  Basic metabolic panel     Status: Abnormal   Collection Time: 01/13/18 12:16 PM  Result Value Ref Range   Sodium 137 135 - 145 mmol/L   Potassium 5.0 3.5 - 5.1 mmol/L   Chloride 105 98 - 111 mmol/L   CO2 23 22 - 32 mmol/L   Glucose, Bld 115 (H)  70 - 99 mg/dL   BUN 51 (H) 8 - 23 mg/dL   Creatinine, Ser 5.15 (H) 0.44 - 1.00 mg/dL   Calcium 11.3 (H) 8.9 - 10.3 mg/dL   GFR calc non Af Amer 7 (L) >60 mL/min   GFR calc Af Amer 8 (L) >60 mL/min    Comment: (NOTE) The eGFR has been calculated using the CKD EPI equation. This calculation has not been validated in all clinical situations. eGFR's persistently <60 mL/min signify possible Chronic Kidney Disease.    Anion gap 9 5 - 15    Comment: Performed at [facility], [address] [place], [place] [address]  CBC     Status: Abnormal   Collection Time: 01/13/18 12:16 PM  Result Value Ref Range   WBC 9.9 4.0 - 10.5 K/uL   RBC 3.42 (L) 3.87 - 5.11 MIL/uL   Hemoglobin 9.2 (L) 12.0 - 15.0 g/dL   HCT 30.1 (L) 36.0 - 46.0 %   MCV 88.0 80.0 - 100.0 fL   MCH 26.9 26.0 - 34.0 pg   MCHC 30.6 30.0 - 36.0 g/dL   RDW 16.1 (H) 11.5 - 15.5 %  Platelets 268 150 - 400 K/uL   nRBC 0.0 0.0 - 0.2 %    Comment: Performed at [facility], [address] [place], [place] [address]  Troponin I     Status: None   Collection Time: 01/13/18 12:16 PM  Result Value Ref Range   Troponin I <0.03 <0.03 ng/mL    Comment: Performed at [facility], [address] [place], [place] [address]  Lactic acid, plasma     Status: None   Collection Time: 01/13/18  5:40 PM  Result Value Ref Range   Lactic Acid, Venous 1.1 0.5 - 1.9 mmol/L    Comment: Performed at [facility], [address], [place], [place] [address]   Dg Chest 2 View  Result Date: 01/13/2018 CLINICAL DATA:  Chest pain and shortness of breath since yesterday EXAM: CHEST - 2 VIEW COMPARISON:  January 21, 2011 FINDINGS: The heart size and mediastinal contours are within normal limits. The aorta is tortuous. Patchy consolidation of left lung base is identified. There is no pulmonary edema or pleural effusion. The lungs are hyperinflated. The visualized skeletal structures are stable. IMPRESSION: Patchy  consolidation of left lung base, suspicious for pneumonia. Electronically Signed   By: Abelardo Diesel M.D.   On: 01/13/2018 14:15    Pending Labs Unresulted Labs (From admission, onward)    Start     Ordered   01/13/18 1627  Blood culture (routine x 2)  BLOOD CULTURE X 2,   STAT     01/13/18 1626   01/13/18 1623  Lactic acid, plasma  Now then every 2 hours,   STAT     01/13/18 1622   Signed and Held  CBC  Tomorrow morning,   R     Signed and Held   Signed and Held  CBC  (heparin)  Once,   R    Comments:  Baseline for heparin therapy IF NOT ALREADY DRAWN.  Notify MD if PLT < 100 K.    Signed and Held   Signed and Held  Creatinine, serum  (heparin)  Once,   R    Comments:  Baseline for heparin therapy IF NOT ALREADY DRAWN.    Signed and Held   Signed and Held  Basic metabolic panel  Tomorrow morning,   R     Signed and Held          Vitals/Pain Today's Vitals   01/13/18 2100 01/13/18 2115 01/13/18 2200 01/13/18 2217  BP: 135/72     Pulse:  85    Resp: (!) 29 (!) 31 (!) 27   Temp:      TempSrc:      SpO2:  96%    Weight:      Height:      PainSc:    Asleep    Isolation Precautions No active isolations  Medications Medications  traMADol (ULTRAM) tablet 50 mg (50 mg Oral Given 01/13/18 1930)  cefTRIAXone (ROCEPHIN) 2 g in sodium chloride 0.9 % 100 mL IVPB (0 g Intravenous Stopped 01/13/18 1850)  azithromycin (ZITHROMAX) 500 mg in sodium chloride 0.9 % 250 mL IVPB (0 mg Intravenous Stopped 01/13/18 2004)    Mobility walks

## 2018-01-13 NOTE — ED Notes (Signed)
Pt up to the bathroom with assistance.  Steady on feet.

## 2018-01-13 NOTE — ED Triage Notes (Signed)
Pt c/o pain under her left breast that radiates into the back with SOB since yesterday evening around 4pm. . States it is worse with deep breathing. Pt is in NAD on arrival

## 2018-01-13 NOTE — ED Notes (Signed)
Family at bedside. Pt ate some soup and crackers. Pt is sitting in bed quietly. Denies any needs at this time.

## 2018-01-13 NOTE — ED Notes (Signed)
Pt c/o cough and shortness of breath that began yesterday. Pt in NAD at this time. Will continue to monitor.

## 2018-01-14 DIAGNOSIS — R079 Chest pain, unspecified: Secondary | ICD-10-CM | POA: Diagnosis not present

## 2018-01-14 DIAGNOSIS — I10 Essential (primary) hypertension: Secondary | ICD-10-CM | POA: Diagnosis not present

## 2018-01-14 DIAGNOSIS — J189 Pneumonia, unspecified organism: Secondary | ICD-10-CM | POA: Diagnosis not present

## 2018-01-14 DIAGNOSIS — N185 Chronic kidney disease, stage 5: Secondary | ICD-10-CM | POA: Diagnosis not present

## 2018-01-14 LAB — BASIC METABOLIC PANEL
Anion gap: 12 (ref 5–15)
BUN: 52 mg/dL — ABNORMAL HIGH (ref 8–23)
CHLORIDE: 105 mmol/L (ref 98–111)
CO2: 23 mmol/L (ref 22–32)
Calcium: 10.4 mg/dL — ABNORMAL HIGH (ref 8.9–10.3)
Creatinine, Ser: 4.73 mg/dL — ABNORMAL HIGH (ref 0.44–1.00)
GFR calc Af Amer: 9 mL/min — ABNORMAL LOW (ref >60)
GFR calc non Af Amer: 8 mL/min — ABNORMAL LOW (ref >60)
GLUCOSE: 107 mg/dL — AB (ref 70–99)
POTASSIUM: 4.9 mmol/L (ref 3.5–5.1)
Sodium: 140 mmol/L (ref 135–145)

## 2018-01-14 LAB — CBC
HCT: 27.1 % — ABNORMAL LOW (ref 36.0–46.0)
HEMOGLOBIN: 8.4 g/dL — AB (ref 12.0–15.0)
MCH: 27.3 pg (ref 26.0–34.0)
MCHC: 31 g/dL (ref 30.0–36.0)
MCV: 88 fL (ref 80.0–100.0)
Platelets: 227 10*3/uL (ref 150–400)
RBC: 3.08 MIL/uL — AB (ref 3.87–5.11)
RDW: 16.2 % — ABNORMAL HIGH (ref 11.5–15.5)
WBC: 10.3 10*3/uL (ref 4.0–10.5)
nRBC: 0 % (ref 0.0–0.2)

## 2018-01-14 LAB — LACTIC ACID, PLASMA: Lactic Acid, Venous: 1 mmol/L (ref 0.5–1.9)

## 2018-01-14 MED ORDER — NEPRO/CARBSTEADY PO LIQD: 237.0000 mL | Freq: Two times a day (BID) | ORAL | Status: DC

## 2018-01-14 MED ORDER — DOXYCYCLINE HYCLATE 100 MG PO TBEC: 100.0000 mg | DELAYED_RELEASE_TABLET | Freq: Two times a day (BID) | ORAL | 0 refills | Status: AC

## 2018-01-14 MED ORDER — ADULT MULTIVITAMIN W/MINERALS CH: 1.0000 | ORAL_TABLET | Freq: Every day | ORAL | Status: DC

## 2018-01-14 NOTE — Care Management Obs Status (Signed)
[place] NOTIFICATION   Patient Details  Name: ANTONEA GAUT MRN: [id number] Date of Birth: 12-14-1934   Medicare Observation Status Notification Given:  Yes    Elza Rafter, RN 01/14/2018, 8:56 AM

## 2018-01-14 NOTE — Progress Notes (Signed)
IV and tele removed from patient. Discharge instructions given to patient along with hard copy prescription. Verbalized understanding. No acute distress. Family at bedside and will transport patient home.

## 2018-01-14 NOTE — Plan of Care (Signed)
  Problem: Education: Goal: Knowledge of General Education information will improve Description Including pain rating scale, medication(s)/side effects and non-pharmacologic comfort measures Outcome: Progressing   Problem: Activity: Goal: Risk for activity intolerance will decrease Outcome: Progressing Note:  Up with one assist to the bathroom, tolerating well   Problem: Nutrition: Goal: Adequate nutrition will be maintained Outcome: Progressing   Problem: Coping: Goal: Level of anxiety will decrease Outcome: Progressing   Problem: Elimination: Goal: Will not experience complications related to urinary retention Outcome: Progressing   Problem: Pain Managment: Goal: General experience of comfort will improve Outcome: Progressing Note:  No complaints of pain this shift   Problem: Safety: Goal: Ability to remain free from injury will improve Outcome: Progressing   Problem: Skin Integrity: Goal: Risk for impaired skin integrity will decrease Outcome: Progressing

## 2018-01-14 NOTE — Care Management Note (Signed)
Case Management Note  Patient Details  Name: Marissa Hess MRN: [id number] Date of Birth: March 03, 1935  Subjective/Objective:     Patient is from home; lives with husband and is independent in all adl's.  Her husband or daughter drive her around.  Placed in observation for pna; IV antibiotics.  CKD V, AV fistula in right arm.  Followed by Holley Raring.  Not currently on HD.  Current with PCP.  Prescriptions sent to [facility] in [place].  Denies difficulty obtaining medications or with housing.  Has plenty of support at home.  Currently on room air.                Action/Plan: No current services in the home. No needs identified by [facility].  MOON letter presented to patient.   Expected Discharge Date:                  Expected Discharge Plan:  Home/Self Care  In-House Referral:     Discharge planning Services  CM Consult  Post Acute Care Choice:    Choice offered to:     DME Arranged:    DME Agency:     HH Arranged:    HH Agency:     Status of Service:  In process, will continue to follow  If discussed at Long Length of Stay Meetings, dates discussed:    Additional Comments:  Elza Rafter, RN 01/14/2018, 8:57 AM

## 2018-01-14 NOTE — Progress Notes (Signed)
Initial Nutrition Assessment  DOCUMENTATION CODES:   Non-severe (moderate) malnutrition in context of chronic illness  INTERVENTION:   Nepro Shake po BID, each supplement provides 425 kcal and 19 grams protein  MVI daily  Magic cup TID with meals, each supplement provides 290 kcal and 9 grams of protein  Dysphagia 3 diet with ground meats   Recommend B12 supplementation   NUTRITION DIAGNOSIS:   Moderate Malnutrition related to chronic illness(CKD V, advanced age ) as evidenced by moderate fat depletions, mild to moderate muscle depletions, wt loss 11% in 4 months.  GOAL:   Patient will meet greater than or equal to 90% of their needs  MONITOR:   PO intake, Supplement acceptance, Labs, Weight trends, Skin, I & O's  REASON FOR ASSESSMENT:   Malnutrition Screening Tool    ASSESSMENT:   83 y.o. female with a known history of CKD stage V, GERD, hypertension, lupus, osteoarthritis, osteoporosis, previous history of MI admitted for PNA   Pt with R AVF  Met with pt in room today. Pt reports poor appetite and oral intake for several months pta. Pt reports that she does not have any desire to eat. Pt does not like supplements or drink any supplements at home but reports that she would be willing to try butter pecan Nepro. Pt only ate bites of her breakfast tray this morning; reports the food tastes bad. Pt is edentulous and reports that she would prefer to have ground meats and soft foods. Pt reports a 40lb weight loss since December; per chart, pt has lost 16lbs(11%) over the past 4 months which is significant for the time frame. RD will add supplements and MVI to help pt meet her estimated needs. RD will change pt to dys 3 diet with ground meats per pt's request. Of note, pt with low B12 in August; RD unsure if this is being supplemented as outpatient. Pt also noted to be on calcitriol and cholecalciferol; unable to see any recent vitamin D labs; would recommend check vitamin D labs  after discharge.   Medications reviewed and include: aspirin, cacitriol, vitamin D, folic acid, lasix, heparin, iron polysaccharides, Na bicarbonate, azithromycin, ceftriaxone, tramadol  Labs reviewed: K 4.9 wnl, BUN 52(H), creat 4.73(H), Ca 10.4(H) Iron 30, TIBC 207, ferritin 88- 8/5 B12 161(L)- 8/5 Hgb 8.4(L), Hct 27.1(L)  NUTRITION - FOCUSED PHYSICAL EXAM:    Most Recent Value  Orbital Region  Moderate depletion  Upper Arm Region  Mild depletion  Thoracic and Lumbar Region  Mild depletion  Buccal Region  Moderate depletion  Temple Region  Severe depletion  Clavicle Bone Region  Moderate depletion  Clavicle and Acromion Bone Region  Moderate depletion  Scapular Bone Region  Mild depletion  Dorsal Hand  Moderate depletion  Patellar Region  Mild depletion  Anterior Thigh Region  Mild depletion  Posterior Calf Region  Mild depletion  Edema (RD Assessment)  None  Hair  Reviewed  Eyes  Reviewed  Mouth  Reviewed  Skin  Reviewed  Nails  Reviewed     Diet Order:   Diet Order            DIET DYS 3 Room service appropriate? Yes; Fluid consistency: Thin  Diet effective now             EDUCATION NEEDS:   Education needs have been addressed  Skin:  Skin Assessment: Reviewed RN Assessment  Last BM:  10/27  Height:   Ht Readings from Last 1 Encounters:  01/13/18 5'  3" (1.6 m)    Weight:   Wt Readings from Last 1 Encounters:  01/13/18 55.5 kg    Ideal Body Weight:  52.3 kg  BMI:  Body mass index is 21.68 kg/m.  Estimated Nutritional Needs:   Kcal:  1300-1500kcal/day   Protein:  55-61g/day   Fluid:  1.2L/day   Koleen Distance MS, RD, LDN Pager #- [phone] Office#- [phone] After Hours Pager: [phone]

## 2018-01-15 ENCOUNTER — Other Ambulatory Visit (INDEPENDENT_AMBULATORY_CARE_PROVIDER_SITE_OTHER): Payer: Self-pay | Admitting: Nurse Practitioner

## 2018-01-15 ENCOUNTER — Telehealth: Payer: Self-pay | Admitting: Family Medicine

## 2018-01-15 MED ORDER — AZITHROMYCIN 250 MG PO TABS: ORAL_TABLET | ORAL | 0 refills | Status: DC

## 2018-01-15 MED ORDER — CLINDAMYCIN PHOSPHATE 300 MG/50ML IV SOLN
300.0000 mg | Freq: Once | INTRAVENOUS | Status: AC
  Administered 2018-01-16: 300 mg via INTRAVENOUS

## 2018-01-15 NOTE — Telephone Encounter (Signed)
Patient was transferred to provider for telephone conversation.   

## 2018-01-15 NOTE — Telephone Encounter (Addendum)
Copied from [place] [phone]. Topic: General - Other >> Jan 15, 2018 10:08 AM Lennox Solders wrote: Reason for CRM: pt is calling and was discharge from [facility] 01-14-18 dx with PNA. Pt is having trouble swallowing doxycycline  abx 100 mg  . Please advise. Pt also having phlegm in her throat.  [facility] in [place]

## 2018-01-15 NOTE — Telephone Encounter (Signed)
Call pt 

## 2018-01-15 NOTE — Telephone Encounter (Signed)
Phone call Discussed with patient unable to swallow doxycycline feels like it got stuck in her throat. Will change to Z-Pak and observe with renal failure.

## 2018-01-15 NOTE — Discharge Summary (Signed)
[facility] NAME: Marissa Hess    MR#:  [id number]  DATE OF BIRTH:  07/15/1934  DATE OF ADMISSION:  01/13/2018 ADMITTING PHYSICIAN: Henreitta Leber, MD  DATE OF DISCHARGE: 01/14/2018  2:19 PM  PRIMARY CARE PHYSICIAN: Guadalupe Maple, MD    ADMISSION DIAGNOSIS:  Pneumonia due to infectious organism, unspecified laterality, unspecified part of lung [J18.9]  DISCHARGE DIAGNOSIS:  Active Problems:   Pneumonia   SECONDARY DIAGNOSIS:   Past Medical History:  Diagnosis Date  . Allergy   . Arthritis   . Chronic kidney disease    CKD V  . GERD (gastroesophageal reflux disease)   . Hypertension   . Lupus ([place]) 2002  . Macular degeneration of left eye   . Myocardial infarction ([place]) 2007  . Osteoporosis     HOSPITAL COURSE:   83 year old female with past medical history of chronic kidney disease stage V, hypertension, history of lupus, osteoarthritis, osteoporosis who presents to the hospital due to chest pain.  1.  Chest pain- suspected to be secondary to underlying pneumonia. -Patient was treated with IV ceftriaxone, Zithromax and given tramadol for the pain.  She has improved.  She was ambulated and did not have any further chest pain or worsening shortness of breath.  Patient was discharged on oral doxycycline.  2.  Pneumonia-suspected to be community-acquired pneumonia -While in the hospital patient was treated with IV ceftriaxone, Zithromax and then discharged on oral doxycycline.  She was ambulated prior to discharge and had no recurrence of her symptoms or any hypoxemia.  3.  CKD stage V- patient's creatinine has been slowly getting worse.  She has a right upper extremity AV fistula, patient follows with Dr. Holley Raring.  No acute need for hemodialysis while in the hospital.   - cont. Outpatient follow up with Nephrology.   4.  Essential hypertension-pt. Will continue carvedilol.  5.  Secondary  hyperparathyroidism-pt. Will continue Rocaltrol.  DISCHARGE CONDITIONS:   Stable.   CONSULTS OBTAINED:    DRUG ALLERGIES:   Allergies  Allergen Reactions  . Codeine Itching  . Ibuprofen     Avoids "because of lupus"  . Sulfa Antibiotics Diarrhea  . Vioxx [Rofecoxib]     Avoids "because of lupus"  . Celebrex [Celecoxib] Rash    Avoids "because of lupus"  . Penicillins Rash    DISCHARGE MEDICATIONS:   Allergies as of 01/14/2018      Reactions   Codeine Itching   Ibuprofen    Avoids "because of lupus"   Sulfa Antibiotics Diarrhea   Vioxx [rofecoxib]    Avoids "because of lupus"   Celebrex [celecoxib] Rash   Avoids "because of lupus"   Penicillins Rash      Medication List    STOP taking these medications   traMADol 50 MG tablet Commonly known as:  ULTRAM     TAKE these medications   aspirin EC 81 MG tablet Take 81 mg by mouth daily.   calcitRIOL 0.5 MCG capsule Commonly known as:  ROCALTROL Take 0.5 mcg by mouth daily.   carvedilol 25 MG tablet Commonly known as:  COREG Take 1 tablet (25 mg total) by mouth 2 (two) times daily with a meal.   cholecalciferol 1000 units tablet Commonly known as:  VITAMIN D Take 1,000 Units by mouth daily.   doxycycline 100 MG EC tablet Commonly known as:  DORYX Take 1 tablet (100 mg total) by mouth 2 (two) times daily for  6 days.   folic acid 1 MG tablet Commonly known as:  FOLVITE Take 1 tablet (1 mg total) by mouth daily.   furosemide 20 MG tablet Commonly known as:  LASIX Take 1 tablet (20 mg total) by mouth every morning.   SLOW RELEASE IRON 160 (50 Fe) MG Tbcr SR tablet Generic drug:  ferrous sulfate Take 1 tablet by mouth daily.   sodium bicarbonate 650 MG tablet Take 1,300 mg by mouth 2 (two) times daily.         DISCHARGE INSTRUCTIONS:   DIET:  Cardiac diet and Renal diet  DISCHARGE CONDITION:  Stable  ACTIVITY:  Activity as tolerated  OXYGEN:  Home Oxygen: No.   Oxygen Delivery:  room air  DISCHARGE LOCATION:  home   If you experience worsening of your admission symptoms, develop shortness of breath, life threatening emergency, suicidal or homicidal thoughts you must seek medical attention immediately by calling 911 or calling your MD immediately  if symptoms less severe.  You Must read complete instructions/literature along with all the possible adverse reactions/side effects for all the Medicines you take and that have been prescribed to you. Take any new Medicines after you have completely understood and accpet all the possible adverse reactions/side effects.   Please note  You were cared for by a hospitalist during your hospital stay. If you have any questions about your discharge medications or the care you received while you were in the hospital after you are discharged, you can call the unit and asked to speak with the hospitalist on call if the hospitalist that took care of you is not available. Once you are discharged, your primary care physician will handle any further medical issues. Please note that NO REFILLS for any discharge medications will be authorized once you are discharged, as it is imperative that you return to your primary care physician (or establish a relationship with a primary care physician if you do not have one) for your aftercare needs so that they can reassess your need for medications and monitor your lab values.     Today   Chest pain has improved, not hypoxic, afebrile.  Ambulated and did well.  And will discharge home today on oral antibiotics.  VITAL SIGNS:  Blood pressure 132/67, pulse 88, temperature 99.3 F (37.4 C), temperature source Oral, resp. rate 18, height 5\' 3"  (1.6 m), weight 55.5 kg, SpO2 96 %.  I/O:  No intake or output data in the 24 hours ending 01/15/18 1703  PHYSICAL EXAMINATION:   GENERAL:  83 y.o.-year-old patient lying in the bed in no acute distress.  EYES: Pupils equal, round, reactive to light and  accommodation. No scleral icterus. Extraocular muscles intact.  HEENT: Head atraumatic, normocephalic. Oropharynx and nasopharynx clear. No oropharyngeal erythema, moist oral mucosa  NECK:  Supple, no jugular venous distention. No thyroid enlargement, no tenderness.  LUNGS: Poor Resp. Effort, no wheezing, rales, rhonchi. No use of accessory muscles of respiration.  CARDIOVASCULAR: S1, S2 RRR. No murmurs, rubs, gallops, clicks.  ABDOMEN: Soft, nontender, nondistended. Bowel sounds present. No organomegaly or mass.  EXTREMITIES: No pedal edema, cyanosis, or clubbing. + 2 pedal & radial pulses b/l.   NEUROLOGIC: Cranial nerves II through XII are intact. No focal Motor or sensory deficits appreciated b/l. Globally weak.  PSYCHIATRIC: The patient is alert and oriented x 3.   SKIN: No obvious rash, lesion, or ulcer.   Right upper Ext AV fistula.   DATA REVIEW:   CBC Recent Labs  Lab 01/14/18 0625  WBC 10.3  HGB 8.4*  HCT 27.1*  PLT 227    Chemistries  Recent Labs  Lab 01/14/18 0625  NA 140  K 4.9  CL 105  CO2 23  GLUCOSE 107*  BUN 52*  CREATININE 4.73*  CALCIUM 10.4*    Cardiac Enzymes Recent Labs  Lab 01/13/18 1216  TROPONINI <0.03    Microbiology Results  Results for orders placed or performed during the hospital encounter of 01/13/18  Blood culture (routine x 2)     Status: None (Preliminary result)   Collection Time: 01/13/18  5:40 PM  Result Value Ref Range Status   Specimen Description BLOOD BLOOD LEFT WRIST  Final   Special Requests   Final    BOTTLES DRAWN AEROBIC AND ANAEROBIC Blood Culture adequate volume   Culture   Final    NO GROWTH 2 DAYS Performed at [facility], [address] [place], [place] [address]    Report Status PENDING  Incomplete  Blood culture (routine x 2)     Status: None (Preliminary result)   Collection Time: 01/13/18  5:40 PM  Result Value Ref Range Status   Specimen Description BLOOD LEFT ANTECUBITAL  Final    Special Requests   Final    BOTTLES DRAWN AEROBIC AND ANAEROBIC Blood Culture adequate volume   Culture   Final    NO GROWTH 2 DAYS Performed at [facility], [address] [place], [place] [address]    Report Status PENDING  Incomplete    RADIOLOGY:  No results found.    Management plans discussed with the patient, family and they are in agreement.  CODE STATUS:  Code Status History    Date Active Date Inactive Code Status Order ID Comments User Context   01/13/2018 2309 01/14/2018 1725 Full Code [id number]  Henreitta Leber, MD Inpatient      TOTAL TIME TAKING CARE OF THIS PATIENT: 40 minutes.    Henreitta Leber M.D on 01/15/2018 at 5:03 PM  Between 7am to 6pm - Pager - [phone]  After 6pm go to [email or web address] - Proofreader  [facility]  Office  [phone]  CC: Primary care physician; Guadalupe Maple, MD

## 2018-01-16 ENCOUNTER — Ambulatory Visit
Admission: RE | Admit: 2018-01-16 | Discharge: 2018-01-16 | Disposition: A | Discharge status: Home / Self Care | Payer: Medicare Other | Source: Ambulatory Visit | Attending: Vascular Surgery | Admitting: Vascular Surgery

## 2018-01-16 ENCOUNTER — Other Ambulatory Visit: Payer: Self-pay

## 2018-01-16 ENCOUNTER — Encounter
Admission: RE | Disposition: A | Discharge status: Home / Self Care | Payer: Self-pay | Source: Ambulatory Visit | Attending: Vascular Surgery

## 2018-01-16 DIAGNOSIS — Z87891 Personal history of nicotine dependence: Secondary | ICD-10-CM | POA: Insufficient documentation

## 2018-01-16 DIAGNOSIS — Z880 Allergy status to penicillin: Secondary | ICD-10-CM | POA: Insufficient documentation

## 2018-01-16 DIAGNOSIS — Z882 Allergy status to sulfonamides status: Secondary | ICD-10-CM | POA: Diagnosis not present

## 2018-01-16 DIAGNOSIS — I120 Hypertensive chronic kidney disease with stage 5 chronic kidney disease or end stage renal disease: Secondary | ICD-10-CM | POA: Diagnosis not present

## 2018-01-16 DIAGNOSIS — I252 Old myocardial infarction: Secondary | ICD-10-CM | POA: Insufficient documentation

## 2018-01-16 DIAGNOSIS — K219 Gastro-esophageal reflux disease without esophagitis: Secondary | ICD-10-CM | POA: Diagnosis not present

## 2018-01-16 DIAGNOSIS — Z7982 Long term (current) use of aspirin: Secondary | ICD-10-CM | POA: Diagnosis not present

## 2018-01-16 DIAGNOSIS — M1990 Unspecified osteoarthritis, unspecified site: Secondary | ICD-10-CM | POA: Diagnosis not present

## 2018-01-16 DIAGNOSIS — N186 End stage renal disease: Secondary | ICD-10-CM | POA: Diagnosis not present

## 2018-01-16 DIAGNOSIS — T82590A Other mechanical complication of surgically created arteriovenous fistula, initial encounter: Secondary | ICD-10-CM | POA: Diagnosis present

## 2018-01-16 DIAGNOSIS — Z885 Allergy status to narcotic agent status: Secondary | ICD-10-CM | POA: Diagnosis not present

## 2018-01-16 DIAGNOSIS — Z4901 Encounter for fitting and adjustment of extracorporeal dialysis catheter: Secondary | ICD-10-CM | POA: Insufficient documentation

## 2018-01-16 DIAGNOSIS — M329 Systemic lupus erythematosus, unspecified: Secondary | ICD-10-CM | POA: Diagnosis not present

## 2018-01-16 DIAGNOSIS — M810 Age-related osteoporosis without current pathological fracture: Secondary | ICD-10-CM | POA: Insufficient documentation

## 2018-01-16 HISTORY — PX: A/V FISTULAGRAM: CATH118298

## 2018-01-16 LAB — POTASSIUM (ARMC VASCULAR LAB ONLY): Potassium (ARMC vascular lab): 4.6 (ref 3.5–5.1)

## 2018-01-16 SURGERY — A/V FISTULAGRAM
Anesthesia: Moderate Sedation | Laterality: Right

## 2018-01-16 MED ORDER — HEPARIN SODIUM (PORCINE) 1000 UNIT/ML IJ SOLN
INTRAMUSCULAR | Status: AC
  Filled 2018-01-16: qty 1

## 2018-01-16 MED ORDER — SODIUM CHLORIDE 0.9 % IV SOLN
INTRAVENOUS | Status: DC
  Administered 2018-01-16: 1000 mL via INTRAVENOUS

## 2018-01-16 MED ORDER — FENTANYL CITRATE (PF) 100 MCG/2ML IJ SOLN: 12.5000 ug | Freq: Once | INTRAMUSCULAR | Status: DC | PRN

## 2018-01-16 MED ORDER — FENTANYL CITRATE (PF) 100 MCG/2ML IJ SOLN
INTRAMUSCULAR | Status: AC
  Filled 2018-01-16: qty 2

## 2018-01-16 MED ORDER — ONDANSETRON HCL 4 MG/2ML IJ SOLN: 4.0000 mg | Freq: Four times a day (QID) | INTRAMUSCULAR | Status: DC | PRN

## 2018-01-16 MED ORDER — IOPAMIDOL (ISOVUE-300) INJECTION 61%
INTRAVENOUS | Status: DC | PRN
  Administered 2018-01-16: 25 mL via INTRAVENOUS

## 2018-01-16 MED ORDER — MIDAZOLAM HCL 2 MG/2ML IJ SOLN
INTRAMUSCULAR | Status: DC | PRN
  Administered 2018-01-16 (×2): 1 mg via INTRAVENOUS

## 2018-01-16 MED ORDER — MIDAZOLAM HCL 5 MG/5ML IJ SOLN
INTRAMUSCULAR | Status: AC
  Filled 2018-01-16: qty 5

## 2018-01-16 MED ORDER — CLINDAMYCIN PHOSPHATE 300 MG/50ML IV SOLN
INTRAVENOUS | Status: AC
  Administered 2018-01-16: 300 mg via INTRAVENOUS
  Filled 2018-01-16: qty 50

## 2018-01-16 MED ORDER — FENTANYL CITRATE (PF) 100 MCG/2ML IJ SOLN
INTRAMUSCULAR | Status: DC | PRN
  Administered 2018-01-16: 50 ug via INTRAVENOUS
  Administered 2018-01-16: 25 ug via INTRAVENOUS

## 2018-01-16 SURGICAL SUPPLY — 7 items
CANNULA 5F STIFF (CANNULA) ×3 IMPLANT
DRAPE BRACHIAL (DRAPES) ×3 IMPLANT
GLIDEWIRE STIFF .35X180X3 HYDR (WIRE) ×3 IMPLANT
PACK ANGIOGRAPHY (CUSTOM PROCEDURE TRAY) ×3 IMPLANT
SHEATH BRITE TIP 6FRX5.5 (SHEATH) ×3 IMPLANT
SUT MNCRL AB 4-0 PS2 18 (SUTURE) ×3 IMPLANT
TOWEL OR 17X26 4PK STRL BLUE (TOWEL DISPOSABLE) ×3 IMPLANT

## 2018-01-16 NOTE — Op Note (Signed)
[facility]    OPERATIVE NOTE   PROCEDURE: 1.   R brachiocephalic arteriovenous fistula cannulation under ultrasound guidance 2.   Right arm fistulagram including central venogram 3.   No intervention required  PRE-OPERATIVE DIAGNOSIS: 1. ESRD 2.  Immature AVF  POST-OPERATIVE DIAGNOSIS: AV fistula widely patent with several side branches, none hemodynamically significant, no evidence of central vein stenosis, no hesitant inflow  SURGEON: Zara Chess, MD  ANESTHESIA: local with MCS  ESTIMATED BLOOD LOSS: 5 cc  FINDING(S): 1. Widely patent AV fistula with several side branches, nonhemodynamically significant 2. No evidence of central vein stenosis  SPECIMEN(S):  None  CONTRAST: 25 cc  FLUORO TIME: 0.1 minutes  MODERATE CONSCIOUS SEDATION TIME: Approximately 20 minutes with 2 mg of Versed and 75 mcg of Fentanyl   INDICATIONS: Marissa Hess is a 83 y.o. female who presents with malfunctioning right brachiocephalic arteriovenous fistula.  The patient is scheduled for diagnostic and potential therapeutic fistulagram.  The patient is aware the risks include but are not limited to: bleeding, infection, thrombosis of the cannulated access, and possible anaphylactic reaction to the contrast.  The patient is aware of the risks of the procedure and elects to proceed forward.  DESCRIPTION: After full informed written consent was obtained, the patient was brought back to the angiography suite and placed supine upon the angiography table.  The patient was connected to monitoring equipment. Moderate conscious sedation was administered with a face to face encounter with the patient throughout the procedure with my supervision of the RN administering medicines and monitoring the patient's vital signs and mental status throughout from the start of the procedure until the patient was taken to the recovery room. The right arm was prepped and draped in the standard fashion  for a percutaneous access intervention.  Under ultrasound guidance, the right brachiocephalic arteriovenous fistula was cannulated with a micropuncture needle under direct ultrasound guidance and a permanent image was performed.  The microwire was advanced into the fistula and the needle was exchanged for the a microsheath.  Hand injections were completed to image the access including the central venous system. This demonstrated several siphoning side branches in the humeral portion of the cephalic vein but none hemodynamically significant enough to deter good fistula development  Based on the completion imaging, no further intervention is necessary.  The wire and balloon were removed from the sheath.  A 4-0 Monocryl purse-string suture was sewn around the sheath.  The sheath was removed while tying down the suture.  A sterile bandage was applied to the puncture site.  COMPLICATIONS: None  CONDITION: Stable   Julianne Rice [facility]  01/16/2018 12:20 PM   This note was created with Dragon Medical transcription system. Any errors in dictation are purely unintentional.

## 2018-01-16 NOTE — H&P (Signed)
Marissa Hess is an 83 y.o. female.   Chief Complaint: Failure to mature right upper extremity brachiocephalic AV fistula HPI: Marissa Hess had construction of a brachiocephalic AV fistula on 17 December 2017.  By report, there is a stenosis present, immature, not yet available for use.  Fistulogram as desired for balloon accelerated maturation.  Past Medical History:  Diagnosis Date  . Allergy   . Arthritis   . Chronic kidney disease    CKD V  . GERD (gastroesophageal reflux disease)   . Hypertension   . Lupus ([place]) 2002  . Macular degeneration of left eye   . Myocardial infarction ([place]) 2007  . Osteoporosis     Past Surgical History:  Procedure Laterality Date  . ABDOMINAL HYSTERECTOMY    . AV FISTULA PLACEMENT Right 12/17/2017   Procedure: ARTERIOVENOUS (AV) FISTULA CREATION ( BRACHIO CEPHALIC POSS. BRACHIO BASILIC );  Surgeon: Algernon Huxley, MD;  Location: [facility] ORS;  Service: Vascular;  Laterality: Right;  . CATARACT EXTRACTION W/PHACO Right 01/03/2016   Procedure: CATARACT EXTRACTION PHACO AND INTRAOCULAR LENS PLACEMENT ([place]);  Surgeon: Leandrew Koyanagi, MD;  Location: [facility];  Service: Ophthalmology;  Laterality: Right;  . COLON SURGERY     intestine became twisted after hernia sugery  . EYE SURGERY     cataract  . HERNIA REPAIR    . TEMPORAL ARTERY BIOPSY / LIGATION    . TONSILLECTOMY      Family History  Problem Relation Age of Onset  . Stomach cancer Mother   . Rheum arthritis Father   . Breast cancer Neg Hx    Social History:  reports that she quit smoking about 23 years ago. Her smoking use included cigarettes. She started smoking about 33 years ago. She has never used smokeless tobacco. She reports that she does not drink alcohol or use drugs.  Allergies:  Allergies  Allergen Reactions  . Codeine Itching  . Ibuprofen     Avoids "because of lupus"  . Sulfa Antibiotics Diarrhea  . Vioxx [Rofecoxib]     Avoids "because of lupus"  . Celebrex  [Celecoxib] Rash    Avoids "because of lupus"  . Penicillins Rash    Medications Prior to Admission  Medication Sig Dispense Refill  . aspirin EC 81 MG tablet Take 81 mg by mouth daily.    Marland Kitchen azithromycin (ZITHROMAX) 250 MG tablet 2 now then 1 a day 6 tablet 0  . calcitRIOL (ROCALTROL) 0.5 MCG capsule Take 0.5 mcg by mouth daily.     . carvedilol (COREG) 25 MG tablet Take 1 tablet (25 mg total) by mouth 2 (two) times daily with a meal. 60 tablet 12  . cholecalciferol (VITAMIN D) 1000 units tablet Take 1,000 Units by mouth daily.     . ferrous sulfate (SLOW RELEASE IRON) 160 (50 Fe) MG TBCR SR tablet Take 1 tablet by mouth daily.     . folic acid (FOLVITE) 1 MG tablet Take 1 tablet (1 mg total) by mouth daily. 30 tablet 12  . furosemide (LASIX) 20 MG tablet Take 1 tablet (20 mg total) by mouth every morning. 30 tablet 12  . sodium bicarbonate 650 MG tablet Take 1,300 mg by mouth 2 (two) times daily.    Marland Kitchen doxycycline (DORYX) 100 MG EC tablet Take 1 tablet (100 mg total) by mouth 2 (two) times daily for 6 days. (Patient not taking: Reported on 01/16/2018) 12 tablet 0    Results for orders placed or performed during the  hospital encounter of 01/16/18 (from the past 48 hour(s))  Potassium [facility] vascular lab only)     Status: None   Collection Time: 01/16/18 10:33 AM  Result Value Ref Range   Potassium [facility] vascular lab) 4.6 3.5 - 5.1    Comment: Performed at [facility], [address] [place], [place] [address]   No results found.  ROS  Blood pressure 124/83, pulse 89, resp. rate 18, height 5\' 3"  (1.6 m), weight 55.5 kg, SpO2 93 %. Physical Exam   Assessment/Plan 1.  Failure to mature right upper extremity brachiocephalic AV fistula.  We will plan on fistulogram with risks of pain, bleeding, infection, rupture, or other untoward event.  All questions were answered.  We will proceed as outlined above.  Marissa Altes, MD 01/16/2018, 11:37 AM

## 2018-01-18 LAB — CULTURE, BLOOD (ROUTINE X 2)
Culture: NO GROWTH
Culture: NO GROWTH
SPECIAL REQUESTS: ADEQUATE
SPECIAL REQUESTS: ADEQUATE

## 2018-01-23 ENCOUNTER — Ambulatory Visit (INDEPENDENT_AMBULATORY_CARE_PROVIDER_SITE_OTHER): Payer: Medicare Other | Admitting: Family Medicine

## 2018-01-23 ENCOUNTER — Other Ambulatory Visit: Payer: Self-pay | Admitting: Internal Medicine

## 2018-01-23 ENCOUNTER — Encounter: Payer: Self-pay | Admitting: Family Medicine

## 2018-01-23 VITALS — BP 117/68 | HR 73 | Temp 97.4°F | Ht 64.0 in | Wt 124.4 lb

## 2018-01-23 DIAGNOSIS — N186 End stage renal disease: Secondary | ICD-10-CM

## 2018-01-23 DIAGNOSIS — N184 Chronic kidney disease, stage 4 (severe): Secondary | ICD-10-CM | POA: Diagnosis not present

## 2018-01-23 DIAGNOSIS — D631 Anemia in chronic kidney disease: Secondary | ICD-10-CM

## 2018-01-23 DIAGNOSIS — J181 Lobar pneumonia, unspecified organism: Secondary | ICD-10-CM | POA: Diagnosis not present

## 2018-01-23 DIAGNOSIS — J189 Pneumonia, unspecified organism: Secondary | ICD-10-CM

## 2018-01-23 NOTE — Assessment & Plan Note (Signed)
Not on dialysis yet- awaiting maturation of her AV fistula. Continue to follow with nephrology. Checking labs today. Call with any concerns.

## 2018-01-23 NOTE — Assessment & Plan Note (Signed)
Lungs clear except for scattered wheezes. Will check CXR on Monday when she's at the hospital to see the cancer center. Call with any concerns.

## 2018-01-23 NOTE — Progress Notes (Signed)
BP 117/68 (BP Location: Left Arm, Patient Position: Sitting, Cuff Size: Normal)   Pulse 73   Temp (!) 97.4 F (36.3 C) (Oral)   Ht 5\' 4"  (1.626 m)   Wt 124 lb 6.4 oz (56.4 kg)   SpO2 99%   BMI 21.35 kg/m    Subjective:    Patient ID: Marissa Hess, female    DOB: [birth year], 83 y.o.   MRN: [id number]  HPI: Marissa Hess is a 83 y.o. female  Chief Complaint  Patient presents with  . Hospitalization Follow-up   Has followed up with Dr. Lucky Cowboy for angiography last week- not matured for dialysis yet.  Transition of Care Hospital Follow up.   Hospital/Facility: [facility] D/C Physician: Dr. Verdell Carmine D/C Date: 01/14/18  Records Requested: 01/23/18 Records Received: 01/23/18 Records Reviewed: 01/23/18  Diagnoses on Discharge: Pneumonia  Date of interactive Contact within 48 hours of discharge: N/A Contact was through: N/A  Date of 7 day or 14 day face-to-face visit:  01/23/18  within 14 days  Outpatient Encounter Medications as of 01/23/2018  Medication Sig  . aspirin EC 81 MG tablet Take 81 mg by mouth daily.  . calcitRIOL (ROCALTROL) 0.5 MCG capsule Take 0.5 mcg by mouth daily.   . carvedilol (COREG) 25 MG tablet Take 1 tablet (25 mg total) by mouth 2 (two) times daily with a meal.  . cholecalciferol (VITAMIN D) 1000 units tablet Take 1,000 Units by mouth daily.   . ferrous sulfate (SLOW RELEASE IRON) 160 (50 Fe) MG TBCR SR tablet Take 1 tablet by mouth daily.   . folic acid (FOLVITE) 1 MG tablet Take 1 tablet (1 mg total) by mouth daily.  . furosemide (LASIX) 20 MG tablet Take 1 tablet (20 mg total) by mouth every morning.  . sodium bicarbonate 650 MG tablet Take 1,300 mg by mouth 2 (two) times daily.  . [DISCONTINUED] azithromycin (ZITHROMAX) 250 MG tablet 2 now then 1 a day (Patient not taking: Reported on 01/23/2018)   No facility-administered encounter medications on file as of 01/23/2018.     Per hospitalist:  "HOSPITAL COURSE:   83 year old female with past medical  history of chronic kidney disease stage V, hypertension, history of lupus, osteoarthritis, osteoporosis who presents to the hospital due to chest pain.  1. Chest pain- suspected to be secondary to underlying pneumonia. -Patient was treated with IV ceftriaxone, Zithromax and given tramadol for the pain.  She has improved.  She was ambulated and did not have any further chest pain or worsening shortness of breath.  Patient was discharged on oral doxycycline.  2. Pneumonia-suspected to be community-acquired pneumonia -While in the hospital patient was treated with IV ceftriaxone, Zithromax and then discharged on oral doxycycline.  She was ambulated prior to discharge and had no recurrence of her symptoms or any hypoxemia.  3. CKD stage V- patient's creatinine has been slowly getting worse. She has a right upper extremity AV fistula, patient follows with Dr. Holley Raring. No acute need for hemodialysis while in the hospital.  - cont. Outpatient follow up with Nephrology.   4. Essential hypertension-pt. Will continue carvedilol.  5. Secondary hyperparathyroidism-pt. Will continue Rocaltrol."   Diagnostic Tests Reviewed:   EXAM: CHEST - 2 VIEW  COMPARISON:  January 21, 2011  FINDINGS: The heart size and mediastinal contours are within normal limits. The aorta is tortuous. Patchy consolidation of left lung base is identified. There is no pulmonary edema or pleural effusion. The lungs are hyperinflated. The visualized skeletal structures  are stable.  IMPRESSION: Patchy consolidation of left lung base, suspicious for pneumonia.   Disposition: Home  Consults: None  Discharge Instructions: Follow up with nephrology and here  Disease/illness Education: Given in writing  Home Health/Community Services Discussions/Referrals: N/A  Establishment or re-establishment of referral orders for community resources: None  Discussion with other health care providers:  None  Assessment and Support of treatment regimen adherence: Good  Appointments Coordinated with:  Patient  She has been feeling better. No more coughing. No more fevers. No SOB.   Relevant past medical, surgical, family and social history reviewed and updated as indicated. Interim medical history since our last visit reviewed. Allergies and medications reviewed and updated.  Review of Systems  Constitutional: Positive for fatigue. Negative for activity change, appetite change, chills, diaphoresis, fever and unexpected weight change.  HENT: Negative.   Eyes: Negative.   Respiratory: Negative.   Cardiovascular: Negative.   Genitourinary: Positive for decreased urine volume. Negative for difficulty urinating, dyspareunia, dysuria, enuresis, flank pain, frequency, genital sores, hematuria, menstrual problem, pelvic pain, urgency, vaginal bleeding, vaginal discharge and vaginal pain.  Neurological: Negative.   Hematological: Negative.   Psychiatric/Behavioral: Negative.     Per HPI unless specifically indicated above     Objective:    BP 117/68 (BP Location: Left Arm, Patient Position: Sitting, Cuff Size: Normal)   Pulse 73   Temp (!) 97.4 F (36.3 C) (Oral)   Ht 5\' 4"  (1.626 m)   Wt 124 lb 6.4 oz (56.4 kg)   SpO2 99%   BMI 21.35 kg/m   Wt Readings from Last 3 Encounters:  01/23/18 124 lb 6.4 oz (56.4 kg)  01/16/18 122 lb 6.4 oz (55.5 kg)  01/13/18 122 lb 6.4 oz (55.5 kg)    Physical Exam  Constitutional: She is oriented to person, place, and time. She appears well-developed and well-nourished. No distress.  HENT:  Head: Normocephalic and atraumatic.  Right Ear: Hearing and external ear normal.  Left Ear: Hearing and external ear normal.  Nose: Nose normal.  Mouth/Throat: Oropharynx is clear and moist. No oropharyngeal exudate.  Eyes: Pupils are equal, round, and reactive to light. Conjunctivae, EOM and lids are normal. Right eye exhibits no discharge. Left eye exhibits  no discharge. No scleral icterus.  Neck: Normal range of motion. Neck supple. No JVD present. No tracheal deviation present. No thyromegaly present.  Cardiovascular: Normal rate, regular rhythm, normal heart sounds and intact distal pulses. Exam reveals no gallop and no friction rub.  No murmur heard. Pulmonary/Chest: Effort normal. No stridor. No respiratory distress. She has wheezes. She has no rales. She exhibits no tenderness.  Scattered wheezing in R lung, otherwise clear  Musculoskeletal: Normal range of motion.  AV fistula in L bicep, healing well  Lymphadenopathy:    She has no cervical adenopathy.  Neurological: She is alert and oriented to person, place, and time.  Skin: Skin is warm, dry and intact. Capillary refill takes less than 2 seconds. No rash noted. She is not diaphoretic. No erythema. No pallor.  Psychiatric: She has a normal mood and affect. Her speech is normal and behavior is normal. Judgment and thought content normal. Cognition and memory are normal.  Nursing note and vitals reviewed.   Results for orders placed or performed during the hospital encounter of 01/16/18  Potassium [facility] vascular lab only)  Result Value Ref Range   Potassium [facility] vascular lab) 4.6 3.5 - 5.1      Assessment & Plan:   Problem  List Items Addressed This Visit      Respiratory   Pneumonia - Primary    Lungs clear except for scattered wheezes. Will check CXR on Monday when she's at the hospital to see the cancer center. Call with any concerns.       Relevant Orders   Comprehensive metabolic panel   CBC with Differential/Platelet   DG Chest 2 View     Genitourinary   Anemia associated with stage 4 chronic renal failure ([place])    Labs drawn today- will send to Cancer center. Following up with them on Monday. Call with any concerns.       ESRD (end stage renal disease) ([place])    Not on dialysis yet- awaiting maturation of her AV fistula. Continue to follow with nephrology. Checking  labs today. Call with any concerns.       Relevant Orders   Comprehensive metabolic panel       Follow up plan: Return As scheduled.

## 2018-01-23 NOTE — Assessment & Plan Note (Signed)
Labs drawn today- will send to Cancer center. Following up with them on Monday. Call with any concerns.

## 2018-01-24 ENCOUNTER — Encounter: Payer: Self-pay | Admitting: Family Medicine

## 2018-01-24 LAB — COMPREHENSIVE METABOLIC PANEL
A/G RATIO: 0.9 — AB (ref 1.2–2.2)
ALK PHOS: 45 IU/L (ref 39–117)
ALT: 7 IU/L (ref 0–32)
AST: 11 IU/L (ref 0–40)
Albumin: 3.6 g/dL (ref 3.5–4.7)
BILIRUBIN TOTAL: 0.2 mg/dL (ref 0.0–1.2)
BUN/Creatinine Ratio: 10 — ABNORMAL LOW (ref 12–28)
BUN: 52 mg/dL — AB (ref 8–27)
CALCIUM: 10.4 mg/dL — AB (ref 8.7–10.3)
CO2: 21 mmol/L (ref 20–29)
Chloride: 102 mmol/L (ref 96–106)
Creatinine, Ser: 5.18 mg/dL — ABNORMAL HIGH (ref 0.57–1.00)
GFR calc Af Amer: 8 mL/min/{1.73_m2} — ABNORMAL LOW (ref >59)
GFR calc non Af Amer: 7 mL/min/{1.73_m2} — ABNORMAL LOW (ref >59)
Globulin, Total: 4.1 g/dL (ref 1.5–4.5)
Glucose: 89 mg/dL (ref 65–99)
Potassium: 4.3 mmol/L (ref 3.5–5.2)
SODIUM: 139 mmol/L (ref 134–144)
Total Protein: 7.7 g/dL (ref 6.0–8.5)

## 2018-01-24 LAB — CBC WITH DIFFERENTIAL/PLATELET
Basophils Absolute: 0.1 10*3/uL (ref 0.0–0.2)
Basos: 1 %
EOS (ABSOLUTE): 0.3 10*3/uL (ref 0.0–0.4)
EOS: 4 %
HEMATOCRIT: 28 % — AB (ref 34.0–46.6)
Hemoglobin: 9.1 g/dL — ABNORMAL LOW (ref 11.1–15.9)
IMMATURE GRANULOCYTES: 2 %
Immature Grans (Abs): 0.1 10*3/uL (ref 0.0–0.1)
Lymphocytes Absolute: 2.3 10*3/uL (ref 0.7–3.1)
Lymphs: 29 %
MCH: 27.5 pg (ref 26.6–33.0)
MCHC: 32.5 g/dL (ref 31.5–35.7)
MCV: 85 fL (ref 79–97)
MONOS ABS: 0.8 10*3/uL (ref 0.1–0.9)
Monocytes: 10 %
NEUTROS PCT: 54 %
Neutrophils Absolute: 4.3 10*3/uL (ref 1.4–7.0)
PLATELETS: 316 10*3/uL (ref 150–450)
RBC: 3.31 x10E6/uL — AB (ref 3.77–5.28)
RDW: 16.6 % — ABNORMAL HIGH (ref 12.3–15.4)
WBC: 7.8 10*3/uL (ref 3.4–10.8)

## 2018-01-26 ENCOUNTER — Telehealth: Payer: Self-pay | Admitting: Family Medicine

## 2018-01-26 ENCOUNTER — Inpatient Hospital Stay: Payer: Medicare Other

## 2018-01-26 ENCOUNTER — Encounter: Payer: Self-pay | Admitting: Internal Medicine

## 2018-01-26 ENCOUNTER — Telehealth: Payer: Self-pay | Admitting: Internal Medicine

## 2018-01-26 ENCOUNTER — Inpatient Hospital Stay: Payer: Medicare Other | Attending: Internal Medicine

## 2018-01-26 ENCOUNTER — Ambulatory Visit
Admission: RE | Admit: 2018-01-26 | Discharge: 2018-01-26 | Disposition: A | Discharge status: Home / Self Care | Payer: Medicare Other | Source: Ambulatory Visit | Attending: Family Medicine | Admitting: Family Medicine

## 2018-01-26 ENCOUNTER — Inpatient Hospital Stay (HOSPITAL_BASED_OUTPATIENT_CLINIC_OR_DEPARTMENT_OTHER): Payer: Medicare Other | Admitting: Internal Medicine

## 2018-01-26 VITALS — BP 111/66 | HR 69 | Temp 97.8°F | Resp 18

## 2018-01-26 VITALS — BP 128/76 | HR 72 | Temp 97.8°F | Resp 16 | Wt 121.0 lb

## 2018-01-26 DIAGNOSIS — D631 Anemia in chronic kidney disease: Secondary | ICD-10-CM

## 2018-01-26 DIAGNOSIS — E538 Deficiency of other specified B group vitamins: Secondary | ICD-10-CM | POA: Diagnosis not present

## 2018-01-26 DIAGNOSIS — J189 Pneumonia, unspecified organism: Secondary | ICD-10-CM

## 2018-01-26 DIAGNOSIS — Z992 Dependence on renal dialysis: Secondary | ICD-10-CM | POA: Diagnosis not present

## 2018-01-26 DIAGNOSIS — J181 Lobar pneumonia, unspecified organism: Principal | ICD-10-CM

## 2018-01-26 DIAGNOSIS — M329 Systemic lupus erythematosus, unspecified: Secondary | ICD-10-CM

## 2018-01-26 DIAGNOSIS — R918 Other nonspecific abnormal finding of lung field: Secondary | ICD-10-CM | POA: Diagnosis not present

## 2018-01-26 DIAGNOSIS — N184 Chronic kidney disease, stage 4 (severe): Secondary | ICD-10-CM | POA: Diagnosis not present

## 2018-01-26 LAB — CBC WITH DIFFERENTIAL/PLATELET
Abs Immature Granulocytes: 0.04 10*3/uL (ref 0.00–0.07)
Basophils Absolute: 0 10*3/uL (ref 0.0–0.1)
Basophils Relative: 1 %
EOS ABS: 0.2 10*3/uL (ref 0.0–0.5)
EOS PCT: 3 %
HEMATOCRIT: 28.9 % — AB (ref 36.0–46.0)
HEMOGLOBIN: 8.9 g/dL — AB (ref 12.0–15.0)
Immature Granulocytes: 1 %
LYMPHS PCT: 31 %
Lymphs Abs: 2 10*3/uL (ref 0.7–4.0)
MCH: 26.7 pg (ref 26.0–34.0)
MCHC: 30.8 g/dL (ref 30.0–36.0)
MCV: 86.8 fL (ref 80.0–100.0)
MONO ABS: 0.7 10*3/uL (ref 0.1–1.0)
Monocytes Relative: 11 %
Neutro Abs: 3.5 10*3/uL (ref 1.7–7.7)
Neutrophils Relative %: 53 %
PLATELETS: 250 10*3/uL (ref 150–400)
RBC: 3.33 MIL/uL — AB (ref 3.87–5.11)
RDW: 17 % — ABNORMAL HIGH (ref 11.5–15.5)
WBC: 6.4 10*3/uL (ref 4.0–10.5)
nRBC: 0 % (ref 0.0–0.2)

## 2018-01-26 LAB — COMPREHENSIVE METABOLIC PANEL
ALK PHOS: 33 U/L — AB (ref 38–126)
ALT: 9 U/L (ref 0–44)
AST: 16 U/L (ref 15–41)
Albumin: 3.3 g/dL — ABNORMAL LOW (ref 3.5–5.0)
Anion gap: 11 (ref 5–15)
BILIRUBIN TOTAL: 0.3 mg/dL (ref 0.3–1.2)
BUN: 53 mg/dL — AB (ref 8–23)
CALCIUM: 10.4 mg/dL — AB (ref 8.9–10.3)
CHLORIDE: 103 mmol/L (ref 98–111)
CO2: 22 mmol/L (ref 22–32)
CREATININE: 5.05 mg/dL — AB (ref 0.44–1.00)
GFR calc Af Amer: 8 mL/min — ABNORMAL LOW (ref >60)
GFR, EST NON AFRICAN AMERICAN: 7 mL/min — AB (ref >60)
Glucose, Bld: 84 mg/dL (ref 70–99)
Potassium: 4.2 mmol/L (ref 3.5–5.1)
Sodium: 136 mmol/L (ref 135–145)
Total Protein: 8.1 g/dL (ref 6.5–8.1)

## 2018-01-26 LAB — IRON AND TIBC
Iron: 49 ug/dL (ref 28–170)
Saturation Ratios: 28 % (ref 10.4–31.8)
TIBC: 178 ug/dL — ABNORMAL LOW (ref 250–450)
UIBC: 129 ug/dL

## 2018-01-26 LAB — FERRITIN: Ferritin: 362 ng/mL — ABNORMAL HIGH (ref 11–307)

## 2018-01-26 MED ORDER — SODIUM CHLORIDE 0.9 % IV SOLN
Freq: Once | INTRAVENOUS | Status: AC
  Administered 2018-01-26: 15:00:00 via INTRAVENOUS
  Filled 2018-01-26: qty 250

## 2018-01-26 MED ORDER — IRON SUCROSE 20 MG/ML IV SOLN
200.0000 mg | Freq: Once | INTRAVENOUS | Status: AC
  Administered 2018-01-26: 200 mg via INTRAVENOUS
  Filled 2018-01-26: qty 10

## 2018-01-26 NOTE — Telephone Encounter (Signed)
Please let her know that her x-ray looks better. We just want to repeat it in 3-4 weeks to make sure she's better (week after Thanksgiving should be good) Order is in.

## 2018-01-26 NOTE — Assessment & Plan Note (Addendum)
#  Anemia chronic kidney disease stage IV; likely secondary lupus.  Hemoglobin most recently 9.1; saturation 15%.  Patient is symptomatic with fatigue. Recommend IV iron every 2 weeks x 3.  #Hemoglobin no significant improvement-8.9 today.  Proceed with Venofer today.  # Discussed use of erythropoietin stimulating agents like Aranesp to stimulate the bone marrow.  Discussed the potential issues with erythropoietin estimating agents-given the risk of stroke thromboembolic events/elevated blood pressure.  However, most of the serious events did not happen when the goal hematocrit is 33/hemoglobin 30.    #Low B12 161-patient not on B12 as recommended.  Recommend B12 injections next visit.  This is ordered.  #Chronic kidney disease stage IV-dialysis planning as per nephrology; appt 11/15th.   # DISPOSITION:  # IV Venofer today # follow up in 4 weeks-MD/ H&H; aranesp.   Cc; Dr.Lateef.

## 2018-01-26 NOTE — Progress Notes (Signed)
[place] NOTE  Patient Care Team: Guadalupe Maple, [name], [name], [name]; [name]; [name]; [name];  [name]    No history exists.     HISTORY OF PRESENTING ILLNESS:  Marissa Hess 83 y.o.  female history of chronic kidney disease stage IV is here for follow-up.  Patient has been IV Venofer x3; without any significant improvement of the hemoglobin.  Hemoglobin today is 9.7.  She continues complain of fatigue.  She is awaiting to see nephrology regarding starting dialysis.  Patient is not too keen on dialysis.  Review of Systems  Constitutional: Positive for malaise/fatigue. Negative for chills, diaphoresis, fever and weight loss.  HENT: Negative for nosebleeds and sore throat.   Eyes: Negative for double vision.  Respiratory: Negative for cough, hemoptysis, sputum production, shortness of breath and wheezing.   Cardiovascular: Negative for chest pain, palpitations, orthopnea and leg swelling.  Gastrointestinal: Negative for abdominal pain, blood in stool, constipation, diarrhea, heartburn, melena, nausea and vomiting.  Genitourinary: Negative for dysuria, frequency and urgency.  Musculoskeletal: Positive for back pain and joint pain.  Skin: Negative.  Negative for itching and rash.  Neurological: Negative for dizziness, tingling, focal weakness, weakness and headaches.  Endo/Heme/Allergies: Does not bruise/bleed easily.  Psychiatric/Behavioral: Negative for depression. The patient is not nervous/anxious and does not have insomnia.      MEDICAL HISTORY:   Past Medical History:  Diagnosis Date  . Allergy   . Arthritis   . Chronic kidney disease    CKD V  . GERD (gastroesophageal reflux disease)   . Hypertension   . Lupus ([place]) 2002  . Macular degeneration of left eye   . Myocardial infarction ([place]) 2007  . Osteoporosis     SURGICAL HISTORY: Past Surgical History:  Procedure Laterality Date  . A/V FISTULAGRAM Right 01/16/2018   Procedure: A/V FISTULAGRAM;  Surgeon: [name], MD;  Location: [facility];  Service: Cardiovascular;  Laterality: Right;  . ABDOMINAL HYSTERECTOMY    . AV FISTULA PLACEMENT Right 12/17/2017   Procedure: ARTERIOVENOUS (AV) FISTULA CREATION ( BRACHIO CEPHALIC POSS. BRACHIO BASILIC );  Surgeon: Algernon Huxley, MD;  Location: [facility];  Service: Vascular;  Laterality: Right;  . CATARACT EXTRACTION W/PHACO Right 01/03/2016   Procedure: CATARACT EXTRACTION PHACO AND INTRAOCULAR LENS PLACEMENT ([place]);  Surgeon: Leandrew Koyanagi, MD;  Location: [facility];  Service: Ophthalmology;  Laterality: Right;  . COLON SURGERY     intestine became twisted after hernia sugery  . EYE SURGERY     cataract  . HERNIA REPAIR    . TEMPORAL ARTERY BIOPSY / LIGATION    . TONSILLECTOMY      SOCIAL HISTORY: Social History   Socioeconomic History  . Marital status: Married    Spouse name: Not on file  . Number of children: Not on file  . Years of education: Not on file  . Highest education level: Not on file  Occupational History    Comment: [employer]; Erlene Quan; child care  Social Needs  . Financial resource strain: Not hard at all  . Food insecurity:  Worry: Never true    Inability: Never true  . Transportation needs:    Medical: No    Non-medical: No  Tobacco Use  . Smoking status: Former Smoker    Types: Cigarettes    Start date: 09/11/1984    Last attempt to quit: 09/26/1994    Years since quitting: 23.3  . Smokeless tobacco: Never Used  Substance and Sexual Activity  . Alcohol use:  No    Alcohol/week: 0.0 standard drinks  . Drug use: No  . Sexual activity: Not on file  Lifestyle  . Physical activity:    Days per week: 0 days    Minutes per session: 0 min  . Stress: Not at all  Relationships  . Social connections:    Talks on phone: More than three times a week    Gets together: More than three times a week    Attends religious service: More than 4 times per year    Active member of club or organization: Yes    Attends meetings of clubs or organizations: More than 4 times per year    Relationship status: Married  . Intimate partner violence:    Fear of current or ex partner: No    Emotionally abused: No    Physically abused: No    Forced sexual activity: No  Other Topics Concern  . Not on file  Social History Narrative  . Not on file    FAMILY HISTORY: Family History  Problem Relation Age of Onset  . Stomach cancer Mother   . Rheum arthritis Father   . Breast cancer Neg Hx     ALLERGIES:  is allergic to codeine; ibuprofen; sulfa antibiotics; vioxx [rofecoxib]; celebrex [celecoxib]; and penicillins.  MEDICATIONS:  Current Outpatient Medications  Medication Sig Dispense Refill  . aspirin EC 81 MG tablet Take 81 mg by mouth daily.    . calcitRIOL (ROCALTROL) 0.5 MCG capsule Take 0.5 mcg by mouth daily.     . carvedilol (COREG) 25 MG tablet Take 1 tablet (25 mg total) by mouth 2 (two) times daily with a meal. 60 tablet 12  . cholecalciferol (VITAMIN D) 1000 units tablet Take 1,000 Units by mouth daily.     . ferrous sulfate (SLOW RELEASE IRON) 160 (50 Fe) MG TBCR SR tablet Take 1 tablet by mouth daily.     . folic acid (FOLVITE) 1 MG tablet Take 1 tablet (1 mg total) by mouth daily. 30 tablet 12  . furosemide (LASIX) 20 MG tablet Take 1 tablet (20 mg total) by mouth every morning. 30 tablet 12  . sodium bicarbonate 650 MG tablet Take 1,300 mg by mouth 2 (two) times daily.     No current facility-administered medications for this visit.        Marland Kitchen  PHYSICAL EXAMINATION: ECOG PERFORMANCE STATUS: 1 - Symptomatic but completely ambulatory  Vitals:   01/26/18 1329  BP: 128/76  Pulse: 72  Resp: 16  Temp: 97.8 F (36.6 C)   Filed Weights   01/26/18 1329  Weight: 121 lb (54.9 kg)    Physical Exam  Constitutional: She is oriented to person, place, and time.  Marissa Hess female patient resting comfortably in the chair.  HENT:  Head: Normocephalic and atraumatic.  Mouth/Throat: Oropharynx is clear and moist. No oropharyngeal exudate.  Eyes: Pupils are equal, round, and reactive to light.  Neck: Normal range of motion. Neck supple.  Cardiovascular: Normal rate and regular rhythm.  Pulmonary/Chest: No respiratory distress. She has no wheezes.  Abdominal: Soft. Bowel sounds are normal. She exhibits no distension and no mass. There is no tenderness. There is no rebound and no guarding.  Musculoskeletal: Normal range of motion. She exhibits no edema or tenderness.  Neurological: She is alert and oriented to person, place, and time.  Skin: Skin is warm.  Psychiatric: Affect normal.     LABORATORY DATA:  I have reviewed the data as listed Lab Results  Component Value Date   WBC 6.4 01/26/2018   HGB 8.9 (L) 01/26/2018   HCT 28.9 (L) 01/26/2018   MCV 86.8 01/26/2018   PLT 250 01/26/2018   Recent Labs    10/08/17 1304  01/14/18 0625 01/23/18 1455 01/26/18 1308  NA 140   < > 140 139 136  K 3.8   < > 4.9 4.3 4.2  CL 102   < > 105 102 103  CO2 23   < > 23 21 22   GLUCOSE 81   < > 107* 89 84  BUN 28*   < > 52* 52* 53*  CREATININE 3.14*   < > 4.73* 5.18* 5.05*  CALCIUM 9.2   < > 10.4* 10.4* 10.4*  GFRNONAA 13*   < > 8* 7* 7*  GFRAA 15*   < > 9* 8* 8*  PROT 7.3  --   --  7.7 8.1  ALBUMIN 3.6  --   --  3.6 3.3*  AST 12  --   --  11 16  ALT 5  --   --  7 9  ALKPHOS 43  --   --  45 33*  BILITOT 0.4  --   --  0.2 0.3   < > = values in this interval not displayed.    RADIOGRAPHIC STUDIES: I have personally  reviewed the radiological images as listed and agreed with the findings in the report. Dg Chest 2 View  Result Date: 01/26/2018 CLINICAL DATA:  Follow-up of left lower lobe pneumonia. EXAM: CHEST - 2 VIEW COMPARISON:  Chest x-ray dated January 13, 2018 FINDINGS: There is persistent increased density in the left lower lobe posteriorly. The right lung and the left upper lobe exhibit no acute abnormalities. The heart is top-normal in size. The pulmonary vascularity is normal. There is tortuosity of the ascending and descending thoracic aorta. There is mild loss of height of the body of approximately T10 which is stable. IMPRESSION: Chronic bronchitic changes. Persistent infiltrate in the left lower lobe. Correlation with the patient's clinical exam and current symptoms is needed. If the patient has been responding to previous antibiotic therapy and is clinically improved, then a follow-up chest x-ray in 3-4 weeks would be useful to assure complete clearing. If the patient has had no significant improvement or is deteriorating clinically, chest CT scanning now would be the most useful next imaging step. Electronically Signed   By: David  Martinique M.D.   On: 01/26/2018 15:27   Dg Chest 2 View  Result Date: 01/13/2018 CLINICAL DATA:  Chest pain and shortness of breath since yesterday EXAM: CHEST - 2 VIEW COMPARISON:  January 21, 2011 FINDINGS: The heart size and mediastinal contours are within normal limits. The aorta is tortuous. Patchy consolidation of left lung base is identified. There is no pulmonary edema or pleural effusion. The lungs are hyperinflated. The visualized skeletal structures are stable. IMPRESSION: Patchy consolidation of left lung base, suspicious for pneumonia. Electronically Signed   By: Abelardo Diesel M.D.   On: 01/13/2018 14:15    ASSESSMENT &  PLAN:   Anemia associated with stage 4 chronic renal failure (HCC) #Anemia chronic kidney disease stage IV; likely secondary lupus.  Hemoglobin  most recently 9.1; saturation 15%.  Patient is symptomatic with fatigue. Recommend IV iron every 2 weeks x 3.  #Hemoglobin no significant improvement-8.9 today.  Proceed with Venofer today.  # Discussed use of erythropoietin stimulating agents like Aranesp to stimulate the bone marrow.  Discussed the potential issues with erythropoietin estimating agents-given the risk of stroke thromboembolic events/elevated blood pressure.  However, most of the serious events did not happen when the goal hematocrit is 33/hemoglobin 30.    #Low B12 161-patient not on B12 as recommended.  Recommend B12 injections next visit.  This is ordered.  #Chronic kidney disease stage IV-dialysis planning as per nephrology; appt 11/15th.   # DISPOSITION:  # IV Venofer today # follow up in 4 weeks-MD/ H&H; aranesp.   Cc; Dr.Lateef.     All questions were answered. The patient knows to call the clinic with any problems, questions or concerns.    Cammie Sickle, MD 01/26/2018 4:57 PM

## 2018-01-26 NOTE — Telephone Encounter (Signed)
Patient notified

## 2018-01-26 NOTE — Telephone Encounter (Signed)
Please also schedule the patient for B12 injection; the next visit. Thx

## 2018-01-28 ENCOUNTER — Ambulatory Visit (INDEPENDENT_AMBULATORY_CARE_PROVIDER_SITE_OTHER): Payer: Medicare Other | Admitting: Nurse Practitioner

## 2018-01-28 ENCOUNTER — Other Ambulatory Visit (INDEPENDENT_AMBULATORY_CARE_PROVIDER_SITE_OTHER): Payer: Medicare Other

## 2018-01-28 ENCOUNTER — Encounter (INDEPENDENT_AMBULATORY_CARE_PROVIDER_SITE_OTHER): Payer: Self-pay | Admitting: Nurse Practitioner

## 2018-01-28 ENCOUNTER — Other Ambulatory Visit (INDEPENDENT_AMBULATORY_CARE_PROVIDER_SITE_OTHER): Payer: Self-pay | Admitting: Nurse Practitioner

## 2018-01-28 VITALS — BP 114/66 | HR 74 | Resp 16 | Ht 63.0 in | Wt 120.8 lb

## 2018-01-28 DIAGNOSIS — N186 End stage renal disease: Secondary | ICD-10-CM

## 2018-01-28 DIAGNOSIS — Z87891 Personal history of nicotine dependence: Secondary | ICD-10-CM

## 2018-01-28 DIAGNOSIS — E785 Hyperlipidemia, unspecified: Secondary | ICD-10-CM

## 2018-01-28 DIAGNOSIS — K219 Gastro-esophageal reflux disease without esophagitis: Secondary | ICD-10-CM

## 2018-01-28 DIAGNOSIS — N185 Chronic kidney disease, stage 5: Secondary | ICD-10-CM

## 2018-02-02 DIAGNOSIS — N2581 Secondary hyperparathyroidism of renal origin: Secondary | ICD-10-CM | POA: Diagnosis not present

## 2018-02-02 DIAGNOSIS — R809 Proteinuria, unspecified: Secondary | ICD-10-CM | POA: Diagnosis not present

## 2018-02-02 DIAGNOSIS — D631 Anemia in chronic kidney disease: Secondary | ICD-10-CM | POA: Diagnosis not present

## 2018-02-02 DIAGNOSIS — N186 End stage renal disease: Secondary | ICD-10-CM | POA: Diagnosis not present

## 2018-02-04 ENCOUNTER — Encounter (INDEPENDENT_AMBULATORY_CARE_PROVIDER_SITE_OTHER): Payer: Self-pay | Admitting: Nurse Practitioner

## 2018-02-04 NOTE — Progress Notes (Signed)
Subjective:    Patient ID: Marissa Hess, female    DOB: July 12, 1934, 83 y.o.   MRN: [id number] Chief Complaint  Patient presents with  . Follow-up    [facility] 6week av fistula    HPI  Marissa Hess is a 83 y.o. female that returns to the office for followup of their dialysis access. The function of the access has been stable. The patient denies increased bleeding time or increased recirculation. Patient denies difficulty with cannulation. The patient denies hand pain or other symptoms consistent with steal phenomena.  No significant arm swelling.  The patient denies redness or swelling at the access site. The patient denies fever or chills at home or while on dialysis.  The patient denies amaurosis fugax or recent TIA symptoms. There are no recent neurological changes noted. The patient denies claudication symptoms or rest pain symptoms. The patient denies history of DVT, PE or superficial thrombophlebitis. The patient denies recent episodes of angina or shortness of breath.    The patient underwent hemodialysis access which revealed flow volumes of 1104.  No areas of significant stenosis.  A patent AV fistula.    Past Medical History:  Diagnosis Date  . Allergy   . Arthritis   . Chronic kidney disease    CKD V  . GERD (gastroesophageal reflux disease)   . Hypertension   . Lupus ([place]) 2002  . Macular degeneration of left eye   . Myocardial infarction ([place]) 2007  . Osteoporosis     Past Surgical History:  Procedure Laterality Date  . A/V FISTULAGRAM Right 01/16/2018   Procedure: A/V FISTULAGRAM;  Surgeon: [name], MD;  Location: [facility];  Service: Cardiovascular;  Laterality: Right;  . ABDOMINAL HYSTERECTOMY    . AV FISTULA PLACEMENT Right 12/17/2017   Procedure: ARTERIOVENOUS (AV) FISTULA CREATION ( BRACHIO CEPHALIC POSS. BRACHIO BASILIC );  Surgeon: Algernon Huxley, MD;  Location: [facility];  Service: Vascular;  Laterality: Right;  . CATARACT EXTRACTION  W/PHACO Right 01/03/2016   Procedure: CATARACT EXTRACTION PHACO AND INTRAOCULAR LENS PLACEMENT ([place]);  Surgeon: Leandrew Koyanagi, MD;  Location: [facility];  Service: Ophthalmology;  Laterality: Right;  . COLON SURGERY     intestine became twisted after hernia sugery  . EYE SURGERY     cataract  . HERNIA REPAIR    . TEMPORAL ARTERY BIOPSY / LIGATION    . TONSILLECTOMY      Social History   Socioeconomic History  . Marital status: Married    Spouse name: Not on file  . Number of children: Not on file  . Years of education: Not on file  . Highest education level: Not on file  Occupational History    Comment: [employer]; [employer]; child care  Social Needs  . Financial resource strain: Not hard at all  . Food insecurity:    Worry: Never true    Inability: Never true  . Transportation needs:    Medical: No    Non-medical: No  Tobacco Use  . Smoking status: Former Smoker    Types: Cigarettes    Start date: 09/11/1984    Last attempt to quit: 09/26/1994    Years since quitting: 23.3  . Smokeless tobacco: Never Used  Substance and Sexual Activity  . Alcohol use: No    Alcohol/week: 0.0 standard drinks  . Drug use: No  . Sexual activity: Not on file  Lifestyle  . Physical activity:    Days per week: 0  days    Minutes per session: 0 min  . Stress: Not at all  Relationships  . Social connections:    Talks on phone: More than three times a week    Gets together: More than three times a week    Attends religious service: More than 4 times per year    Active member of club or organization: Yes    Attends meetings of clubs or organizations: More than 4 times per year    Relationship status: Married  . Intimate partner violence:    Fear of current or ex partner: No    Emotionally abused: No    Physically abused: No    Forced sexual activity: No  Other Topics Concern  . Not on file  Social History Narrative  . Not on file    Family History  Problem Relation  Age of Onset  . Stomach cancer Mother   . Rheum arthritis Father   . Breast cancer Neg Hx     Allergies  Allergen Reactions  . Codeine Itching  . Ibuprofen     Avoids "because of lupus"  . Sulfa Antibiotics Diarrhea  . Vioxx [Rofecoxib]     Avoids "because of lupus"  . Celebrex [Celecoxib] Rash    Avoids "because of lupus"  . Penicillins Rash     Review of Systems   Review of Systems: Negative Unless Checked Constitutional: [] Weight loss  [] Fever  [] Chills Cardiac: [] Chest pain   []  Atrial Fibrillation  [] Palpitations   [] Shortness of breath when laying flat   [] Shortness of breath with exertion. Vascular:  [x] Pain in legs with walking   [] Pain in legs with standing  [] History of DVT   [] Phlebitis   [] Swelling in legs   [] Varicose veins   [] Non-healing ulcers Pulmonary:   [] Uses home oxygen   [] Productive cough   [] Hemoptysis   [] Wheeze  [] COPD   [] Asthma Neurologic:  [] Dizziness   [] Seizures   [] History of stroke   [] History of TIA  [] Aphasia   [] Vissual changes   [] Weakness or numbness in arm   [] Weakness or numbness in leg Musculoskeletal:   [] Joint swelling   [] Joint pain   [] Low back pain  []  History of Knee Replacement Hematologic:  [] Easy bruising  [] Easy bleeding   [] Hypercoagulable state   [x] Anemic Gastrointestinal:  [] Diarrhea   [] Vomiting  [x] Gastroesophageal reflux/heartburn   [x] Difficulty swallowing. Genitourinary:  [x] Chronic kidney disease   [] Difficult urination  [] Anuric   [] Blood in urine Skin:  [] Rashes   [] Ulcers  Psychological:  [] History of anxiety   []  History of major depression  []  Memory Difficulties     Objective:   Physical Exam  BP 114/66 (BP Location: Left Arm)   Pulse 74   Resp 16   Ht 5\' 3"  (1.6 m)   Wt 120 lb 12.8 oz (54.8 kg)   BMI 21.40 kg/m   Gen: WD/WN, NAD Head: [place]/AT, No temporalis wasting.  Ear/Nose/Throat: Hearing grossly intact, nares w/o erythema or drainage Eyes: PER, EOMI, sclera nonicteric.  Neck: Supple, no masses.   No JVD.  Pulmonary:  Good air movement, no use of accessory muscles.  Cardiac: RRR Vascular:  Good thrill and bruit present Vessel Right Left  Radial Palpable Palpable   Gastrointestinal: soft, non-distended. No guarding/no peritoneal signs.  Musculoskeletal: M/S 5/5 throughout.  No deformity or atrophy.  Neurologic: Pain and light touch intact in extremities.  Symmetrical.  Speech is fluent. Motor exam as listed above. Psychiatric: Judgment intact, Mood &  affect appropriate for pt's clinical situation. Dermatologic: No Venous rashes. No Ulcers Noted.  No changes consistent with cellulitis. Lymph : No Cervical lymphadenopathy, no lichenification or skin changes of chronic lymphedema.      Assessment & Plan:   1. CKD (chronic kidney disease), stage V ([place]) The patient underwent hemodialysis access which revealed flow volumes of 1104.  No areas of significant stenosis.  A patent AV fistula.   Recommend:  The patient is doing well and currently has adequate dialysis access. The patient's dialysis center is not reporting any access issues. Flow pattern is stable when compared to the prior ultrasound.  The patient has not yet started dialysis, however she has adequate access to begin dialysis.  The patient should have a duplex ultrasound of the dialysis access in 6 months. The patient will follow-up with me in the office after each ultrasound    - VAS [place] (AVF, AVG); Future  2. Hyperlipidemia, unspecified hyperlipidemia type Continue statin as ordered and reviewed, no changes at this time   3. Gastroesophageal reflux disease without esophagitis Continue PPI as already ordered, this medication has been reviewed and there are no changes at this time.  Avoidence of caffeine and alcohol  Moderate elevation of the head of the bed    Current Outpatient Medications on File Prior to Visit  Medication Sig Dispense Refill  . aspirin EC 81 MG tablet Take 81 mg  by mouth daily.    . calcitRIOL (ROCALTROL) 0.5 MCG capsule Take 0.5 mcg by mouth daily.     . carvedilol (COREG) 25 MG tablet Take 1 tablet (25 mg total) by mouth 2 (two) times daily with a meal. 60 tablet 12  . cholecalciferol (VITAMIN D) 1000 units tablet Take 1,000 Units by mouth daily.     . ferrous sulfate (SLOW RELEASE IRON) 160 (50 Fe) MG TBCR SR tablet Take 1 tablet by mouth daily.     . folic acid (FOLVITE) 1 MG tablet Take 1 tablet (1 mg total) by mouth daily. 30 tablet 12  . furosemide (LASIX) 20 MG tablet Take 1 tablet (20 mg total) by mouth every morning. 30 tablet 12  . sodium bicarbonate 650 MG tablet Take 1,300 mg by mouth 2 (two) times daily.     No current facility-administered medications on file prior to visit.     There are no Patient Instructions on file for this visit. Return in about 6 months (around 07/29/2018).   Kris Hartmann, NP  This note was completed with Sales executive.  Any errors are purely unintentional.

## 2018-02-05 DIAGNOSIS — N186 End stage renal disease: Secondary | ICD-10-CM | POA: Diagnosis not present

## 2018-02-05 DIAGNOSIS — Z23 Encounter for immunization: Secondary | ICD-10-CM | POA: Diagnosis not present

## 2018-02-05 DIAGNOSIS — Z992 Dependence on renal dialysis: Secondary | ICD-10-CM | POA: Diagnosis not present

## 2018-02-06 DIAGNOSIS — N186 End stage renal disease: Secondary | ICD-10-CM | POA: Diagnosis not present

## 2018-02-06 DIAGNOSIS — Z992 Dependence on renal dialysis: Secondary | ICD-10-CM | POA: Diagnosis not present

## 2018-02-06 DIAGNOSIS — Z23 Encounter for immunization: Secondary | ICD-10-CM | POA: Diagnosis not present

## 2018-02-08 DIAGNOSIS — N186 End stage renal disease: Secondary | ICD-10-CM | POA: Diagnosis not present

## 2018-02-08 DIAGNOSIS — Z23 Encounter for immunization: Secondary | ICD-10-CM | POA: Diagnosis not present

## 2018-02-08 DIAGNOSIS — Z992 Dependence on renal dialysis: Secondary | ICD-10-CM | POA: Diagnosis not present

## 2018-02-09 ENCOUNTER — Ambulatory Visit: Payer: Medicare Other

## 2018-02-10 DIAGNOSIS — Z992 Dependence on renal dialysis: Secondary | ICD-10-CM | POA: Diagnosis not present

## 2018-02-10 DIAGNOSIS — Z23 Encounter for immunization: Secondary | ICD-10-CM | POA: Diagnosis not present

## 2018-02-10 DIAGNOSIS — N186 End stage renal disease: Secondary | ICD-10-CM | POA: Diagnosis not present

## 2018-02-13 DIAGNOSIS — N186 End stage renal disease: Secondary | ICD-10-CM | POA: Diagnosis not present

## 2018-02-13 DIAGNOSIS — Z23 Encounter for immunization: Secondary | ICD-10-CM | POA: Diagnosis not present

## 2018-02-13 DIAGNOSIS — Z992 Dependence on renal dialysis: Secondary | ICD-10-CM | POA: Diagnosis not present

## 2018-02-14 DIAGNOSIS — Z992 Dependence on renal dialysis: Secondary | ICD-10-CM | POA: Diagnosis not present

## 2018-02-14 DIAGNOSIS — N186 End stage renal disease: Secondary | ICD-10-CM | POA: Diagnosis not present

## 2018-02-16 DIAGNOSIS — Z992 Dependence on renal dialysis: Secondary | ICD-10-CM | POA: Diagnosis not present

## 2018-02-16 DIAGNOSIS — N186 End stage renal disease: Secondary | ICD-10-CM | POA: Diagnosis not present

## 2018-02-16 DIAGNOSIS — Z23 Encounter for immunization: Secondary | ICD-10-CM | POA: Diagnosis not present

## 2018-02-17 ENCOUNTER — Other Ambulatory Visit: Payer: Self-pay | Admitting: Internal Medicine

## 2018-02-18 DIAGNOSIS — Z23 Encounter for immunization: Secondary | ICD-10-CM | POA: Diagnosis not present

## 2018-02-18 DIAGNOSIS — N186 End stage renal disease: Secondary | ICD-10-CM | POA: Diagnosis not present

## 2018-02-18 DIAGNOSIS — Z992 Dependence on renal dialysis: Secondary | ICD-10-CM | POA: Diagnosis not present

## 2018-02-20 DIAGNOSIS — N186 End stage renal disease: Secondary | ICD-10-CM | POA: Diagnosis not present

## 2018-02-20 DIAGNOSIS — Z23 Encounter for immunization: Secondary | ICD-10-CM | POA: Diagnosis not present

## 2018-02-20 DIAGNOSIS — Z992 Dependence on renal dialysis: Secondary | ICD-10-CM | POA: Diagnosis not present

## 2018-02-23 ENCOUNTER — Inpatient Hospital Stay: Payer: Medicare Other | Admitting: Internal Medicine

## 2018-02-23 ENCOUNTER — Inpatient Hospital Stay: Payer: Medicare Other

## 2018-02-23 DIAGNOSIS — Z23 Encounter for immunization: Secondary | ICD-10-CM | POA: Diagnosis not present

## 2018-02-23 DIAGNOSIS — Z992 Dependence on renal dialysis: Secondary | ICD-10-CM | POA: Diagnosis not present

## 2018-02-23 DIAGNOSIS — N186 End stage renal disease: Secondary | ICD-10-CM | POA: Diagnosis not present

## 2018-02-23 NOTE — Assessment & Plan Note (Deleted)
#  Anemia chronic kidney disease stage IV; likely secondary lupus.  Hemoglobin most recently 9.1; saturation 15%.  Patient is symptomatic with fatigue. Recommend IV iron every 2 weeks x 3.  #Hemoglobin no significant improvement-8.9 today.  Proceed with Venofer today.  # Discussed use of erythropoietin stimulating agents like Aranesp to stimulate the bone marrow.  Discussed the potential issues with erythropoietin estimating agents-given the risk of stroke thromboembolic events/elevated blood pressure.  However, most of the serious events did not happen when the goal hematocrit is 33/hemoglobin 30.    #Low B12 161-patient not on B12 as recommended.  Recommend B12 injections next visit.  This is ordered.  #Chronic kidney disease stage IV-dialysis planning as per nephrology; appt 11/15th.   # DISPOSITION:  # IV Venofer today # follow up in 4 weeks-MD/ H&H; aranesp.   Cc; Dr.Lateef.

## 2018-02-23 NOTE — Progress Notes (Deleted)
[employer] NOTE  Patient Care Team: Guadalupe Maple, [name], [name], [name]; [name]; [name]; [name];  [name]    No history exists.     HISTORY OF PRESENTING ILLNESS:  Marissa Hess 83 y.o.  female history of chronic kidney disease stage IV is here for follow-up.  Patient has been IV Venofer x3; without any significant improvement of the hemoglobin.  Hemoglobin today is 9.7.  She continues complain of fatigue.  She is awaiting to see nephrology regarding starting dialysis.  Patient is not too keen on dialysis.  Review of Systems  Constitutional: Positive for malaise/fatigue. Negative for chills, diaphoresis, fever and weight loss.  HENT: Negative for nosebleeds and sore throat.   Eyes: Negative for double vision.  Respiratory: Negative for cough, hemoptysis, sputum production, shortness of breath and wheezing.   Cardiovascular: Negative for chest pain, palpitations, orthopnea and leg swelling.  Gastrointestinal: Negative for abdominal pain, blood in stool, constipation, diarrhea, heartburn, melena, nausea and vomiting.  Genitourinary: Negative for dysuria, frequency and urgency.  Musculoskeletal: Positive for back pain and joint pain.  Skin: Negative.  Negative for itching and rash.  Neurological: Negative for dizziness, tingling, focal weakness, weakness and headaches.  Endo/Heme/Allergies: Does not bruise/bleed easily.  Psychiatric/Behavioral: Negative for depression. The patient is not nervous/anxious and does not have insomnia.      MEDICAL HISTORY:   Past Medical History:  Diagnosis Date  . Allergy   . Arthritis   . Chronic kidney disease    CKD V  . GERD (gastroesophageal reflux disease)   . Hypertension   . Lupus ([place]) 2002  . Macular degeneration of left eye   . Myocardial infarction ([place]) 2007  . Osteoporosis     SURGICAL HISTORY: Past Surgical History:  Procedure Laterality Date  . A/V FISTULAGRAM Right 01/16/2018   Procedure: A/V FISTULAGRAM;  Surgeon: [name], MD;  Location: [facility];  Service: Cardiovascular;  Laterality: Right;  . ABDOMINAL HYSTERECTOMY    . AV FISTULA PLACEMENT Right 12/17/2017   Procedure: ARTERIOVENOUS (AV) FISTULA CREATION ( BRACHIO CEPHALIC POSS. BRACHIO BASILIC );  Surgeon: Algernon Huxley, MD;  Location: [facility];  Service: Vascular;  Laterality: Right;  . CATARACT EXTRACTION W/PHACO Right 01/03/2016   Procedure: CATARACT EXTRACTION PHACO AND INTRAOCULAR LENS PLACEMENT ([place]);  Surgeon: Leandrew Koyanagi, MD;  Location: [facility];  Service: Ophthalmology;  Laterality: Right;  . COLON SURGERY     intestine became twisted after hernia sugery  . EYE SURGERY     cataract  . HERNIA REPAIR    . TEMPORAL ARTERY BIOPSY / LIGATION    . TONSILLECTOMY      SOCIAL HISTORY: Social History   Socioeconomic History  . Marital status: Married    Spouse name: Not on file  . Number of children: Not on file  . Years of education: Not on file  . Highest education level: Not on file  Occupational History    Comment: [employer]; Erlene Quan; child care  Social Needs  . Financial resource strain: Not hard at all  . Food insecurity:  Worry: Never true    Inability: Never true  . Transportation needs:    Medical: No    Non-medical: No  Tobacco Use  . Smoking status: Former Smoker    Types: Cigarettes    Start date: 09/11/1984    Last attempt to quit: 09/26/1994    Years since quitting: 23.4  . Smokeless tobacco: Never Used  Substance and Sexual Activity  . Alcohol use:  No    Alcohol/week: 0.0 standard drinks  . Drug use: No  . Sexual activity: Not on file  Lifestyle  . Physical activity:    Days per week: 0 days    Minutes per session: 0 min  . Stress: Not at all  Relationships  . Social connections:    Talks on phone: More than three times a week    Gets together: More than three times a week    Attends religious service: More than 4 times per year    Active member of club or organization: Yes    Attends meetings of clubs or organizations: More than 4 times per year    Relationship status: Married  . Intimate partner violence:    Fear of current or ex partner: No    Emotionally abused: No    Physically abused: No    Forced sexual activity: No  Other Topics Concern  . Not on file  Social History Narrative  . Not on file    FAMILY HISTORY: Family History  Problem Relation Age of Onset  . Stomach cancer Mother   . Rheum arthritis Father   . Breast cancer Neg Hx     ALLERGIES:  is allergic to codeine; ibuprofen; sulfa antibiotics; vioxx [rofecoxib]; celebrex [celecoxib]; and penicillins.  MEDICATIONS:  Current Outpatient Medications  Medication Sig Dispense Refill  . aspirin EC 81 MG tablet Take 81 mg by mouth daily.    . calcitRIOL (ROCALTROL) 0.5 MCG capsule Take 0.5 mcg by mouth daily.     . carvedilol (COREG) 25 MG tablet Take 1 tablet (25 mg total) by mouth 2 (two) times daily with a meal. 60 tablet 12  . cholecalciferol (VITAMIN D) 1000 units tablet Take 1,000 Units by mouth daily.     . ferrous sulfate (SLOW RELEASE IRON) 160 (50 Fe) MG TBCR SR tablet Take 1 tablet by mouth daily.     . folic acid (FOLVITE) 1 MG tablet Take 1 tablet (1 mg total) by mouth daily. 30 tablet 12  . furosemide (LASIX) 20 MG tablet Take 1 tablet (20 mg total) by mouth every morning. 30 tablet 12  . sodium bicarbonate 650 MG tablet Take 1,300 mg by mouth 2 (two) times daily.     No current facility-administered medications for this visit.        Marland Kitchen  PHYSICAL EXAMINATION: ECOG PERFORMANCE STATUS: 1 - Symptomatic but completely ambulatory  There were no vitals filed for this visit. There were no vitals filed for this visit.  Physical Exam  Constitutional: She is oriented to person, place, and time.  Marissa Hess female patient resting comfortably in the chair.  HENT:  Head: Normocephalic and atraumatic.  Mouth/Throat: Oropharynx is clear and moist. No oropharyngeal exudate.  Eyes: Pupils are equal, round, and reactive to light.  Neck: Normal range of motion. Neck supple.  Cardiovascular: Normal rate and regular rhythm.  Pulmonary/Chest: No respiratory distress. She has no wheezes.  Abdominal: Soft. Bowel sounds are normal. She exhibits no distension and no mass. There is no tenderness. There is  no rebound and no guarding.  Musculoskeletal: Normal range of motion. She exhibits no edema or tenderness.  Neurological: She is alert and oriented to person, place, and time.  Skin: Skin is warm.  Psychiatric: Affect normal.     LABORATORY DATA:  I have reviewed the data as listed Lab Results  Component Value Date   WBC 6.4 01/26/2018   HGB 8.9 (L) 01/26/2018   HCT 28.9 (L) 01/26/2018   MCV 86.8 01/26/2018   PLT 250 01/26/2018   Recent Labs    10/08/17 1304  01/14/18 0625 01/23/18 1455 01/26/18 1308  NA 140   < > 140 139 136  K 3.8   < > 4.9 4.3 4.2  CL 102   < > 105 102 103  CO2 23   < > 23 21 22   GLUCOSE 81   < > 107* 89 84  BUN 28*   < > 52* 52* 53*  CREATININE 3.14*   < > 4.73* 5.18* 5.05*  CALCIUM 9.2   < > 10.4* 10.4* 10.4*  GFRNONAA 13*   < > 8* 7* 7*  GFRAA 15*   < > 9* 8* 8*  PROT 7.3  --   --  7.7 8.1  ALBUMIN 3.6  --   --  3.6 3.3*  AST 12  --   --  11 16  ALT 5  --   --  7 9  ALKPHOS 43  --   --  45 33*  BILITOT 0.4  --   --  0.2 0.3   < > = values in this interval not displayed.    RADIOGRAPHIC STUDIES: I have personally reviewed the radiological images as listed and agreed with the  findings in the report. Dg Chest 2 View  Result Date: 01/26/2018 CLINICAL DATA:  Follow-up of left lower lobe pneumonia. EXAM: CHEST - 2 VIEW COMPARISON:  Chest x-ray dated January 13, 2018 FINDINGS: There is persistent increased density in the left lower lobe posteriorly. The right lung and the left upper lobe exhibit no acute abnormalities. The heart is top-normal in size. The pulmonary vascularity is normal. There is tortuosity of the ascending and descending thoracic aorta. There is mild loss of height of the body of approximately T10 which is stable. IMPRESSION: Chronic bronchitic changes. Persistent infiltrate in the left lower lobe. Correlation with the patient's clinical exam and current symptoms is needed. If the patient has been responding to previous antibiotic therapy and is clinically improved, then a follow-up chest x-ray in 3-4 weeks would be useful to assure complete clearing. If the patient has had no significant improvement or is deteriorating clinically, chest CT scanning now would be the most useful next imaging step. Electronically Signed   By: David  Martinique M.D.   On: 01/26/2018 15:27   Vas US Duplex Dialysis Access (avf, Avg)  Result Date: 01/30/2018 DIALYSIS ACCESS Reason for Exam: Routine follow up. Access Site: Right Upper Extremity. Access Type: Brachial-cephalic AVF. History: ESRD, first look at new AVF created 12/17/2017. Comparison Study: none Performing Technologist: Concha Norway RVT  Examination Guidelines: A complete evaluation includes B-mode imaging, spectral Doppler, color Doppler, and power Doppler as needed of all accessible portions of each vessel. Unilateral testing is considered an integral part of a complete examination. Limited examinations for reoccurring indications may be performed as noted.  Findings: +--------------------+----------+-----------------+--------+ AVF                 PSV (cm/s)Flow Vol (mL/min)Comments  +--------------------+----------+-----------------+--------+ Native  artery inflow   279          1104                +--------------------+----------+-----------------+--------+ AVF Anastomosis        227                              +--------------------+----------+-----------------+--------+  +---------------+----------+-------------+----------+--------+ OUTFLOW VEIN   PSV (cm/s)Diameter (cm)Depth (cm)Describe +---------------+----------+-------------+----------+--------+ Subclavian vein   140                                    +---------------+----------+-------------+----------+--------+ Axillary vein      81                                    +---------------+----------+-------------+----------+--------+ Confluence         63                                    +---------------+----------+-------------+----------+--------+ Shoulder          207                                    +---------------+----------+-------------+----------+--------+ Prox UA           116                                    +---------------+----------+-------------+----------+--------+ Mid UA             79                                    +---------------+----------+-------------+----------+--------+ Dist UA           227                                    +---------------+----------+-------------+----------+--------+ AC Fossa          363                                    +---------------+----------+-------------+----------+--------+  +----------------+-------------+----------+---------+----------+---------------+                 Diameter (cm)Depth (cm)BranchingPSV (cm/s)  Flow Volume                                                                (ml/min)     +----------------+-------------+----------+---------+----------+---------------+ Rt radial artery                                    37  distal                                                                     +----------------+-------------+----------+---------+----------+---------------+ Antegrade                                                                 +----------------+-------------+----------+---------+----------+---------------+  Summary: Patent arteriovenous fistula. *See table(s) above for measurements and observations.  Diagnosing physician: Leotis Pain MD Electronically signed by Leotis Pain MD on 01/30/2018 at 2:30:56 PM.    --------------------------------------------------------------------------------   Final     ASSESSMENT & PLAN:   No problem-specific Assessment & Plan notes found for this encounter.   All questions were answered. The patient knows to call the clinic with any problems, questions or concerns.    Marissa Sickle, MD 02/23/2018 7:43 AM

## 2018-02-25 DIAGNOSIS — Z992 Dependence on renal dialysis: Secondary | ICD-10-CM | POA: Diagnosis not present

## 2018-02-25 DIAGNOSIS — Z23 Encounter for immunization: Secondary | ICD-10-CM | POA: Diagnosis not present

## 2018-02-25 DIAGNOSIS — N186 End stage renal disease: Secondary | ICD-10-CM | POA: Diagnosis not present

## 2018-02-25 DIAGNOSIS — I259 Chronic ischemic heart disease, unspecified: Secondary | ICD-10-CM | POA: Diagnosis not present

## 2018-02-27 DIAGNOSIS — Z992 Dependence on renal dialysis: Secondary | ICD-10-CM | POA: Diagnosis not present

## 2018-02-27 DIAGNOSIS — N186 End stage renal disease: Secondary | ICD-10-CM | POA: Diagnosis not present

## 2018-02-27 DIAGNOSIS — Z23 Encounter for immunization: Secondary | ICD-10-CM | POA: Diagnosis not present

## 2018-03-02 DIAGNOSIS — Z23 Encounter for immunization: Secondary | ICD-10-CM | POA: Diagnosis not present

## 2018-03-02 DIAGNOSIS — N186 End stage renal disease: Secondary | ICD-10-CM | POA: Diagnosis not present

## 2018-03-02 DIAGNOSIS — Z992 Dependence on renal dialysis: Secondary | ICD-10-CM | POA: Diagnosis not present

## 2018-03-04 DIAGNOSIS — N186 End stage renal disease: Secondary | ICD-10-CM | POA: Diagnosis not present

## 2018-03-04 DIAGNOSIS — Z992 Dependence on renal dialysis: Secondary | ICD-10-CM | POA: Diagnosis not present

## 2018-03-04 DIAGNOSIS — Z23 Encounter for immunization: Secondary | ICD-10-CM | POA: Diagnosis not present

## 2018-03-06 DIAGNOSIS — N186 End stage renal disease: Secondary | ICD-10-CM | POA: Diagnosis not present

## 2018-03-06 DIAGNOSIS — Z23 Encounter for immunization: Secondary | ICD-10-CM | POA: Diagnosis not present

## 2018-03-06 DIAGNOSIS — Z992 Dependence on renal dialysis: Secondary | ICD-10-CM | POA: Diagnosis not present

## 2018-03-08 DIAGNOSIS — Z23 Encounter for immunization: Secondary | ICD-10-CM | POA: Diagnosis not present

## 2018-03-08 DIAGNOSIS — N186 End stage renal disease: Secondary | ICD-10-CM | POA: Diagnosis not present

## 2018-03-08 DIAGNOSIS — Z992 Dependence on renal dialysis: Secondary | ICD-10-CM | POA: Diagnosis not present

## 2018-03-10 DIAGNOSIS — Z23 Encounter for immunization: Secondary | ICD-10-CM | POA: Diagnosis not present

## 2018-03-10 DIAGNOSIS — Z992 Dependence on renal dialysis: Secondary | ICD-10-CM | POA: Diagnosis not present

## 2018-03-10 DIAGNOSIS — N186 End stage renal disease: Secondary | ICD-10-CM | POA: Diagnosis not present

## 2018-03-13 DIAGNOSIS — Z23 Encounter for immunization: Secondary | ICD-10-CM | POA: Diagnosis not present

## 2018-03-13 DIAGNOSIS — Z992 Dependence on renal dialysis: Secondary | ICD-10-CM | POA: Diagnosis not present

## 2018-03-13 DIAGNOSIS — N186 End stage renal disease: Secondary | ICD-10-CM | POA: Diagnosis not present

## 2018-03-16 DIAGNOSIS — N186 End stage renal disease: Secondary | ICD-10-CM | POA: Diagnosis not present

## 2018-03-16 DIAGNOSIS — Z23 Encounter for immunization: Secondary | ICD-10-CM | POA: Diagnosis not present

## 2018-03-16 DIAGNOSIS — Z992 Dependence on renal dialysis: Secondary | ICD-10-CM | POA: Diagnosis not present

## 2018-03-17 DIAGNOSIS — N186 End stage renal disease: Secondary | ICD-10-CM | POA: Diagnosis not present

## 2018-03-17 DIAGNOSIS — Z992 Dependence on renal dialysis: Secondary | ICD-10-CM | POA: Diagnosis not present

## 2018-03-18 DIAGNOSIS — Z992 Dependence on renal dialysis: Secondary | ICD-10-CM | POA: Diagnosis not present

## 2018-03-18 DIAGNOSIS — Z23 Encounter for immunization: Secondary | ICD-10-CM | POA: Diagnosis not present

## 2018-03-18 DIAGNOSIS — N186 End stage renal disease: Secondary | ICD-10-CM | POA: Diagnosis not present

## 2018-03-20 DIAGNOSIS — Z992 Dependence on renal dialysis: Secondary | ICD-10-CM | POA: Diagnosis not present

## 2018-03-20 DIAGNOSIS — N186 End stage renal disease: Secondary | ICD-10-CM | POA: Diagnosis not present

## 2018-03-20 DIAGNOSIS — Z23 Encounter for immunization: Secondary | ICD-10-CM | POA: Diagnosis not present

## 2018-03-23 DIAGNOSIS — Z992 Dependence on renal dialysis: Secondary | ICD-10-CM | POA: Diagnosis not present

## 2018-03-23 DIAGNOSIS — Z23 Encounter for immunization: Secondary | ICD-10-CM | POA: Diagnosis not present

## 2018-03-23 DIAGNOSIS — N186 End stage renal disease: Secondary | ICD-10-CM | POA: Diagnosis not present

## 2018-03-25 DIAGNOSIS — Z992 Dependence on renal dialysis: Secondary | ICD-10-CM | POA: Diagnosis not present

## 2018-03-25 DIAGNOSIS — N186 End stage renal disease: Secondary | ICD-10-CM | POA: Diagnosis not present

## 2018-03-25 DIAGNOSIS — Z23 Encounter for immunization: Secondary | ICD-10-CM | POA: Diagnosis not present

## 2018-03-28 DIAGNOSIS — Z992 Dependence on renal dialysis: Secondary | ICD-10-CM | POA: Diagnosis not present

## 2018-03-28 DIAGNOSIS — Z23 Encounter for immunization: Secondary | ICD-10-CM | POA: Diagnosis not present

## 2018-03-28 DIAGNOSIS — N186 End stage renal disease: Secondary | ICD-10-CM | POA: Diagnosis not present

## 2018-03-30 DIAGNOSIS — Z23 Encounter for immunization: Secondary | ICD-10-CM | POA: Diagnosis not present

## 2018-03-30 DIAGNOSIS — Z992 Dependence on renal dialysis: Secondary | ICD-10-CM | POA: Diagnosis not present

## 2018-03-30 DIAGNOSIS — N186 End stage renal disease: Secondary | ICD-10-CM | POA: Diagnosis not present

## 2018-04-01 DIAGNOSIS — Z992 Dependence on renal dialysis: Secondary | ICD-10-CM | POA: Diagnosis not present

## 2018-04-01 DIAGNOSIS — N186 End stage renal disease: Secondary | ICD-10-CM | POA: Diagnosis not present

## 2018-04-01 DIAGNOSIS — Z23 Encounter for immunization: Secondary | ICD-10-CM | POA: Diagnosis not present

## 2018-04-03 DIAGNOSIS — N186 End stage renal disease: Secondary | ICD-10-CM | POA: Diagnosis not present

## 2018-04-03 DIAGNOSIS — Z992 Dependence on renal dialysis: Secondary | ICD-10-CM | POA: Diagnosis not present

## 2018-04-03 DIAGNOSIS — Z23 Encounter for immunization: Secondary | ICD-10-CM | POA: Diagnosis not present

## 2018-04-06 DIAGNOSIS — Z23 Encounter for immunization: Secondary | ICD-10-CM | POA: Diagnosis not present

## 2018-04-06 DIAGNOSIS — N186 End stage renal disease: Secondary | ICD-10-CM | POA: Diagnosis not present

## 2018-04-06 DIAGNOSIS — Z992 Dependence on renal dialysis: Secondary | ICD-10-CM | POA: Diagnosis not present

## 2018-04-07 ENCOUNTER — Ambulatory Visit: Payer: Medicare Other | Admitting: Family Medicine

## 2018-04-08 DIAGNOSIS — Z992 Dependence on renal dialysis: Secondary | ICD-10-CM | POA: Diagnosis not present

## 2018-04-08 DIAGNOSIS — N186 End stage renal disease: Secondary | ICD-10-CM | POA: Diagnosis not present

## 2018-04-08 DIAGNOSIS — Z23 Encounter for immunization: Secondary | ICD-10-CM | POA: Diagnosis not present

## 2018-04-10 DIAGNOSIS — Z23 Encounter for immunization: Secondary | ICD-10-CM | POA: Diagnosis not present

## 2018-04-10 DIAGNOSIS — Z992 Dependence on renal dialysis: Secondary | ICD-10-CM | POA: Diagnosis not present

## 2018-04-10 DIAGNOSIS — N186 End stage renal disease: Secondary | ICD-10-CM | POA: Diagnosis not present

## 2018-04-13 DIAGNOSIS — Z23 Encounter for immunization: Secondary | ICD-10-CM | POA: Diagnosis not present

## 2018-04-13 DIAGNOSIS — N186 End stage renal disease: Secondary | ICD-10-CM | POA: Diagnosis not present

## 2018-04-13 DIAGNOSIS — Z992 Dependence on renal dialysis: Secondary | ICD-10-CM | POA: Diagnosis not present

## 2018-04-15 DIAGNOSIS — Z23 Encounter for immunization: Secondary | ICD-10-CM | POA: Diagnosis not present

## 2018-04-15 DIAGNOSIS — N186 End stage renal disease: Secondary | ICD-10-CM | POA: Diagnosis not present

## 2018-04-15 DIAGNOSIS — Z992 Dependence on renal dialysis: Secondary | ICD-10-CM | POA: Diagnosis not present

## 2018-04-17 DIAGNOSIS — Z23 Encounter for immunization: Secondary | ICD-10-CM | POA: Diagnosis not present

## 2018-04-17 DIAGNOSIS — Z992 Dependence on renal dialysis: Secondary | ICD-10-CM | POA: Diagnosis not present

## 2018-04-17 DIAGNOSIS — N186 End stage renal disease: Secondary | ICD-10-CM | POA: Diagnosis not present

## 2018-04-20 DIAGNOSIS — N186 End stage renal disease: Secondary | ICD-10-CM | POA: Diagnosis not present

## 2018-04-20 DIAGNOSIS — Z992 Dependence on renal dialysis: Secondary | ICD-10-CM | POA: Diagnosis not present

## 2018-04-22 DIAGNOSIS — Z992 Dependence on renal dialysis: Secondary | ICD-10-CM | POA: Diagnosis not present

## 2018-04-22 DIAGNOSIS — N186 End stage renal disease: Secondary | ICD-10-CM | POA: Diagnosis not present

## 2018-04-24 DIAGNOSIS — N186 End stage renal disease: Secondary | ICD-10-CM | POA: Diagnosis not present

## 2018-04-24 DIAGNOSIS — Z992 Dependence on renal dialysis: Secondary | ICD-10-CM | POA: Diagnosis not present

## 2018-04-27 DIAGNOSIS — Z992 Dependence on renal dialysis: Secondary | ICD-10-CM | POA: Diagnosis not present

## 2018-04-27 DIAGNOSIS — N186 End stage renal disease: Secondary | ICD-10-CM | POA: Diagnosis not present

## 2018-04-28 ENCOUNTER — Ambulatory Visit: Payer: Medicare Other | Admitting: Family Medicine

## 2018-04-29 DIAGNOSIS — N186 End stage renal disease: Secondary | ICD-10-CM | POA: Diagnosis not present

## 2018-04-29 DIAGNOSIS — Z992 Dependence on renal dialysis: Secondary | ICD-10-CM | POA: Diagnosis not present

## 2018-05-01 DIAGNOSIS — N186 End stage renal disease: Secondary | ICD-10-CM | POA: Diagnosis not present

## 2018-05-01 DIAGNOSIS — Z992 Dependence on renal dialysis: Secondary | ICD-10-CM | POA: Diagnosis not present

## 2018-05-04 DIAGNOSIS — N186 End stage renal disease: Secondary | ICD-10-CM | POA: Diagnosis not present

## 2018-05-04 DIAGNOSIS — Z992 Dependence on renal dialysis: Secondary | ICD-10-CM | POA: Diagnosis not present

## 2018-05-06 DIAGNOSIS — Z992 Dependence on renal dialysis: Secondary | ICD-10-CM | POA: Diagnosis not present

## 2018-05-06 DIAGNOSIS — N186 End stage renal disease: Secondary | ICD-10-CM | POA: Diagnosis not present

## 2018-05-08 DIAGNOSIS — Z992 Dependence on renal dialysis: Secondary | ICD-10-CM | POA: Diagnosis not present

## 2018-05-08 DIAGNOSIS — N186 End stage renal disease: Secondary | ICD-10-CM | POA: Diagnosis not present

## 2018-05-13 DIAGNOSIS — Z992 Dependence on renal dialysis: Secondary | ICD-10-CM | POA: Diagnosis not present

## 2018-05-13 DIAGNOSIS — N186 End stage renal disease: Secondary | ICD-10-CM | POA: Diagnosis not present

## 2018-05-15 DIAGNOSIS — Z992 Dependence on renal dialysis: Secondary | ICD-10-CM | POA: Diagnosis not present

## 2018-05-15 DIAGNOSIS — N186 End stage renal disease: Secondary | ICD-10-CM | POA: Diagnosis not present

## 2018-05-16 DIAGNOSIS — Z992 Dependence on renal dialysis: Secondary | ICD-10-CM | POA: Diagnosis not present

## 2018-05-16 DIAGNOSIS — N186 End stage renal disease: Secondary | ICD-10-CM | POA: Diagnosis not present

## 2018-05-18 DIAGNOSIS — Z992 Dependence on renal dialysis: Secondary | ICD-10-CM | POA: Diagnosis not present

## 2018-05-18 DIAGNOSIS — N186 End stage renal disease: Secondary | ICD-10-CM | POA: Diagnosis not present

## 2018-05-20 DIAGNOSIS — Z992 Dependence on renal dialysis: Secondary | ICD-10-CM | POA: Diagnosis not present

## 2018-05-20 DIAGNOSIS — N186 End stage renal disease: Secondary | ICD-10-CM | POA: Diagnosis not present

## 2018-05-22 DIAGNOSIS — Z992 Dependence on renal dialysis: Secondary | ICD-10-CM | POA: Diagnosis not present

## 2018-05-22 DIAGNOSIS — N186 End stage renal disease: Secondary | ICD-10-CM | POA: Diagnosis not present

## 2018-05-25 DIAGNOSIS — N186 End stage renal disease: Secondary | ICD-10-CM | POA: Diagnosis not present

## 2018-05-25 DIAGNOSIS — Z992 Dependence on renal dialysis: Secondary | ICD-10-CM | POA: Diagnosis not present

## 2018-05-27 DIAGNOSIS — Z992 Dependence on renal dialysis: Secondary | ICD-10-CM | POA: Diagnosis not present

## 2018-05-27 DIAGNOSIS — N186 End stage renal disease: Secondary | ICD-10-CM | POA: Diagnosis not present

## 2018-05-29 DIAGNOSIS — N186 End stage renal disease: Secondary | ICD-10-CM | POA: Diagnosis not present

## 2018-05-29 DIAGNOSIS — Z992 Dependence on renal dialysis: Secondary | ICD-10-CM | POA: Diagnosis not present

## 2018-06-01 DIAGNOSIS — N186 End stage renal disease: Secondary | ICD-10-CM | POA: Diagnosis not present

## 2018-06-01 DIAGNOSIS — Z992 Dependence on renal dialysis: Secondary | ICD-10-CM | POA: Diagnosis not present

## 2018-06-03 ENCOUNTER — Telehealth: Payer: Self-pay | Admitting: Family Medicine

## 2018-06-03 DIAGNOSIS — Z992 Dependence on renal dialysis: Secondary | ICD-10-CM | POA: Diagnosis not present

## 2018-06-03 DIAGNOSIS — N186 End stage renal disease: Secondary | ICD-10-CM | POA: Diagnosis not present

## 2018-06-03 NOTE — Telephone Encounter (Signed)
Pharmacy called and stated that the pt called and would like a prescription sent in for clonazepam. Please advise.

## 2018-06-03 NOTE — Telephone Encounter (Signed)
Pharmacy notified. They are going to call and notify the patient.

## 2018-06-03 NOTE — Telephone Encounter (Signed)
Not on her medication list. Needs appointment for this.

## 2018-06-05 DIAGNOSIS — N186 End stage renal disease: Secondary | ICD-10-CM | POA: Diagnosis not present

## 2018-06-05 DIAGNOSIS — Z992 Dependence on renal dialysis: Secondary | ICD-10-CM | POA: Diagnosis not present

## 2018-06-08 DIAGNOSIS — Z992 Dependence on renal dialysis: Secondary | ICD-10-CM | POA: Diagnosis not present

## 2018-06-08 DIAGNOSIS — N186 End stage renal disease: Secondary | ICD-10-CM | POA: Diagnosis not present

## 2018-06-09 ENCOUNTER — Encounter: Payer: Self-pay | Admitting: Family Medicine

## 2018-06-09 ENCOUNTER — Ambulatory Visit (INDEPENDENT_AMBULATORY_CARE_PROVIDER_SITE_OTHER): Payer: Medicare Other | Admitting: Family Medicine

## 2018-06-09 VITALS — Ht 64.0 in | Wt 125.0 lb

## 2018-06-09 DIAGNOSIS — G2581 Restless legs syndrome: Secondary | ICD-10-CM | POA: Diagnosis not present

## 2018-06-09 DIAGNOSIS — F419 Anxiety disorder, unspecified: Secondary | ICD-10-CM | POA: Diagnosis not present

## 2018-06-09 DIAGNOSIS — J301 Allergic rhinitis due to pollen: Secondary | ICD-10-CM | POA: Diagnosis not present

## 2018-06-09 MED ORDER — FLUTICASONE PROPIONATE 50 MCG/ACT NA SUSP: 1.0000 | Freq: Two times a day (BID) | NASAL | 6 refills | Status: DC

## 2018-06-09 MED ORDER — CETIRIZINE HCL 10 MG PO TABS: 10.0000 mg | ORAL_TABLET | Freq: Every day | ORAL | 0 refills | Status: DC

## 2018-06-09 MED ORDER — CLONAZEPAM 0.5 MG PO TABS: 0.5000 mg | ORAL_TABLET | Freq: Every evening | ORAL | 0 refills | Status: DC | PRN

## 2018-06-09 NOTE — Progress Notes (Signed)
Ht 5\' 4"  (1.626 m) Comment: Patient reported  Wt 125 lb (56.7 kg) Comment: Patient reported  BMI 21.46 kg/m   Unable to obtain remaining vitals as pt did not have the equipment and today's visit had to be conducted virtually.   Subjective:    Patient ID: Marissa Hess, female    DOB: [birth year], 84 y.o.   MRN: [id number]  HPI: Marissa Hess is a 84 y.o. female  Chief Complaint  Patient presents with  . Restless Leg    Patient was wondering if she could get a refill on her Clonzepam. States her Restless Leg has worsened since she's been on dialysis.     . This visit was completed via telephone due to the restrictions of the COVID-19 pandemic. All issues as above were discussed and addressed but no physical exam was performed. If it was felt that the patient should be evaluated in the office, they were directed there. The patient verbally consented to this visit. Patient was unable to complete an audio/visual visit due to Lack of equipment. Due to the catastrophic nature of the COVID-19 pandemic, this visit was done through audio contact only. . Location of the patient: home . Location of the provider: work . Those involved with this call:  . Provider: Merrie Roof, PA-C . CMA: Merilyn Baba, [place] . Front Desk/Registration: Jill Side  . Time spent on call: 15 minutes on the phone discussing health concerns   Restless legs, insomnia, anxiety since starting dialysis in Nov. Has lost a significant amount of weight because of these things. Was previously on clonazepam 0.5 mg which helped a lot, wanting to restart. Notes the recent issues with the pandemic have further worsened things.   Also dealing with some sinus drainage and tickle in throat the past week or so. Known hx of allergic rhinitis, states spring always gets things irritated for her. Currently not taking anything for this. Denies fevers, chills, CP, SOB, body aches, sick contacts, travel outside the country.   Relevant  past medical, surgical, family and social history reviewed and updated as indicated. Interim medical history since our last visit reviewed. Allergies and medications reviewed and updated.  Review of Systems  Per HPI unless specifically indicated above     Objective:    Ht 5\' 4"  (1.626 m) Comment: Patient reported  Wt 125 lb (56.7 kg) Comment: Patient reported  BMI 21.46 kg/m   Wt Readings from Last 3 Encounters:  06/09/18 125 lb (56.7 kg)  01/28/18 120 lb 12.8 oz (54.8 kg)  01/26/18 121 lb (54.9 kg)    Physical Exam  Unable to perform physical exam as had to do telephone visit  Results for orders placed or performed in visit on 01/26/18  Comprehensive metabolic panel  Result Value Ref Range   Sodium 136 135 - 145 mmol/L   Potassium 4.2 3.5 - 5.1 mmol/L   Chloride 103 98 - 111 mmol/L   CO2 22 22 - 32 mmol/L   Glucose, Bld 84 70 - 99 mg/dL   BUN 53 (H) 8 - 23 mg/dL   Creatinine, Ser 5.05 (H) 0.44 - 1.00 mg/dL   Calcium 10.4 (H) 8.9 - 10.3 mg/dL   Total Protein 8.1 6.5 - 8.1 g/dL   Albumin 3.3 (L) 3.5 - 5.0 g/dL   AST 16 15 - 41 U/L   ALT 9 0 - 44 U/L   Alkaline Phosphatase 33 (L) 38 - 126 U/L   Total Bilirubin 0.3 0.3 - 1.2  mg/dL   GFR calc non Af Amer 7 (L) >60 mL/min   GFR calc Af Amer 8 (L) >60 mL/min   Anion gap 11 5 - 15  CBC with Differential/Platelet  Result Value Ref Range   WBC 6.4 4.0 - 10.5 K/uL   RBC 3.33 (L) 3.87 - 5.11 MIL/uL   Hemoglobin 8.9 (L) 12.0 - 15.0 g/dL   HCT 28.9 (L) 36.0 - 46.0 %   MCV 86.8 80.0 - 100.0 fL   MCH 26.7 26.0 - 34.0 pg   MCHC 30.8 30.0 - 36.0 g/dL   RDW 17.0 (H) 11.5 - 15.5 %   Platelets 250 150 - 400 K/uL   nRBC 0.0 0.0 - 0.2 %   Neutrophils Relative % 53 %   Neutro Abs 3.5 1.7 - 7.7 K/uL   Lymphocytes Relative 31 %   Lymphs Abs 2.0 0.7 - 4.0 K/uL   Monocytes Relative 11 %   Monocytes Absolute 0.7 0.1 - 1.0 K/uL   Eosinophils Relative 3 %   Eosinophils Absolute 0.2 0.0 - 0.5 K/uL   Basophils Relative 1 %   Basophils  Absolute 0.0 0.0 - 0.1 K/uL   Immature Granulocytes 1 %   Abs Immature Granulocytes 0.04 0.00 - 0.07 K/uL  Iron and TIBC  Result Value Ref Range   Iron 49 28 - 170 ug/dL   TIBC 178 (L) 250 - 450 ug/dL   Saturation Ratios 28 10.4 - 31.8 %   UIBC 129 ug/dL  Ferritin  Result Value Ref Range   Ferritin 362 (H) 11 - 307 ng/mL      Assessment & Plan:   Problem List Items Addressed This Visit      Respiratory   Allergic rhinitis    Start zyrtec and flonase regimen, sinus rinses prn, supportive care reviewed.        Other Visit Diagnoses    Anxiety    -  Primary   Will restart rare klonopin use, recheck in 1 month. If having to use regularly, will consider starting remeron or something similar for better control       Follow up plan: Return in about 4 weeks (around 07/07/2018) for Anxiety f/u.

## 2018-06-10 DIAGNOSIS — N186 End stage renal disease: Secondary | ICD-10-CM | POA: Diagnosis not present

## 2018-06-10 DIAGNOSIS — Z992 Dependence on renal dialysis: Secondary | ICD-10-CM | POA: Diagnosis not present

## 2018-06-12 NOTE — Assessment & Plan Note (Signed)
Start zyrtec and flonase regimen, sinus rinses prn, supportive care reviewed.

## 2018-06-15 DIAGNOSIS — N186 End stage renal disease: Secondary | ICD-10-CM | POA: Diagnosis not present

## 2018-06-15 DIAGNOSIS — Z992 Dependence on renal dialysis: Secondary | ICD-10-CM | POA: Diagnosis not present

## 2018-06-16 DIAGNOSIS — N186 End stage renal disease: Secondary | ICD-10-CM | POA: Diagnosis not present

## 2018-06-16 DIAGNOSIS — Z992 Dependence on renal dialysis: Secondary | ICD-10-CM | POA: Diagnosis not present

## 2018-06-17 DIAGNOSIS — N186 End stage renal disease: Secondary | ICD-10-CM | POA: Diagnosis not present

## 2018-06-17 DIAGNOSIS — Z992 Dependence on renal dialysis: Secondary | ICD-10-CM | POA: Diagnosis not present

## 2018-06-19 DIAGNOSIS — Z992 Dependence on renal dialysis: Secondary | ICD-10-CM | POA: Diagnosis not present

## 2018-06-19 DIAGNOSIS — N186 End stage renal disease: Secondary | ICD-10-CM | POA: Diagnosis not present

## 2018-06-22 DIAGNOSIS — N186 End stage renal disease: Secondary | ICD-10-CM | POA: Diagnosis not present

## 2018-06-22 DIAGNOSIS — Z992 Dependence on renal dialysis: Secondary | ICD-10-CM | POA: Diagnosis not present

## 2018-06-24 DIAGNOSIS — Z992 Dependence on renal dialysis: Secondary | ICD-10-CM | POA: Diagnosis not present

## 2018-06-24 DIAGNOSIS — N186 End stage renal disease: Secondary | ICD-10-CM | POA: Diagnosis not present

## 2018-06-26 DIAGNOSIS — N186 End stage renal disease: Secondary | ICD-10-CM | POA: Diagnosis not present

## 2018-06-26 DIAGNOSIS — Z992 Dependence on renal dialysis: Secondary | ICD-10-CM | POA: Diagnosis not present

## 2018-06-29 DIAGNOSIS — N186 End stage renal disease: Secondary | ICD-10-CM | POA: Diagnosis not present

## 2018-06-29 DIAGNOSIS — Z992 Dependence on renal dialysis: Secondary | ICD-10-CM | POA: Diagnosis not present

## 2018-07-01 DIAGNOSIS — Z992 Dependence on renal dialysis: Secondary | ICD-10-CM | POA: Diagnosis not present

## 2018-07-01 DIAGNOSIS — N186 End stage renal disease: Secondary | ICD-10-CM | POA: Diagnosis not present

## 2018-07-03 DIAGNOSIS — N186 End stage renal disease: Secondary | ICD-10-CM | POA: Diagnosis not present

## 2018-07-03 DIAGNOSIS — Z992 Dependence on renal dialysis: Secondary | ICD-10-CM | POA: Diagnosis not present

## 2018-07-06 DIAGNOSIS — N186 End stage renal disease: Secondary | ICD-10-CM | POA: Diagnosis not present

## 2018-07-06 DIAGNOSIS — Z992 Dependence on renal dialysis: Secondary | ICD-10-CM | POA: Diagnosis not present

## 2018-07-08 DIAGNOSIS — Z992 Dependence on renal dialysis: Secondary | ICD-10-CM | POA: Diagnosis not present

## 2018-07-08 DIAGNOSIS — N186 End stage renal disease: Secondary | ICD-10-CM | POA: Diagnosis not present

## 2018-07-10 ENCOUNTER — Ambulatory Visit: Payer: Medicare Other | Admitting: Family Medicine

## 2018-07-10 DIAGNOSIS — N186 End stage renal disease: Secondary | ICD-10-CM | POA: Diagnosis not present

## 2018-07-10 DIAGNOSIS — Z992 Dependence on renal dialysis: Secondary | ICD-10-CM | POA: Diagnosis not present

## 2018-07-13 DIAGNOSIS — Z992 Dependence on renal dialysis: Secondary | ICD-10-CM | POA: Diagnosis not present

## 2018-07-13 DIAGNOSIS — N186 End stage renal disease: Secondary | ICD-10-CM | POA: Diagnosis not present

## 2018-07-14 ENCOUNTER — Other Ambulatory Visit: Payer: Self-pay

## 2018-07-14 ENCOUNTER — Ambulatory Visit (INDEPENDENT_AMBULATORY_CARE_PROVIDER_SITE_OTHER): Payer: Medicare Other | Admitting: Family Medicine

## 2018-07-14 ENCOUNTER — Encounter: Payer: Self-pay | Admitting: Family Medicine

## 2018-07-14 DIAGNOSIS — J301 Allergic rhinitis due to pollen: Secondary | ICD-10-CM | POA: Diagnosis not present

## 2018-07-14 DIAGNOSIS — F419 Anxiety disorder, unspecified: Secondary | ICD-10-CM

## 2018-07-14 MED ORDER — CETIRIZINE HCL 10 MG PO TABS: 10.0000 mg | ORAL_TABLET | Freq: Every day | ORAL | 2 refills | Status: DC

## 2018-07-14 MED ORDER — CLONAZEPAM 0.5 MG PO TABS: 0.5000 mg | ORAL_TABLET | Freq: Every evening | ORAL | 0 refills | Status: DC | PRN

## 2018-07-14 NOTE — Progress Notes (Signed)
There were no vitals taken for this visit.   Subjective:    Patient ID: Marissa Hess, female    DOB: 03-Dec-1934, 84 y.o.   MRN: [id number]  HPI: Marissa Hess is a 84 y.o. female  Chief Complaint  Patient presents with   Anxiety     This visit was completed via telephone due to the restrictions of the COVID-19 pandemic. All issues as above were discussed and addressed but no physical exam was performed. If it was felt that the patient should be evaluated in the office, they were directed there. The patient verbally consented to this visit. Patient was unable to complete an audio/visual visit due to Technical difficulties,Lack of internet. Due to the catastrophic nature of the COVID-19 pandemic, this visit was done through audio contact only.  Location of the patient: home  Location of the provider: home  Those involved with this call:   Provider: Merrie Roof, PA-C  CMA: Yvonna Alanis, [place] Desk/Registration: Jill Side   Time spent on call: 15 minutes on the phone discussing health concerns. 5 minutes total spent in review of patient's record and preparation of their chart. I verified patient identity using two factors (patient name and date of birth). Patient consents verbally to being seen via telemedicine visit today.   Patient presenting today for 1 month f/u allergies and anxiety.   Notes significant symptomatic improvement with addition of zyrtec and flonase regimen. Still some occasional congestion but for the most part no sinus pressure, rhinorrhea, sore throat, cough, fevers. No side effects on the medicines.   Anxiety under much better control with prn klonopin use. States she's using about 3/week at this time which is going very well. No side effects, states it just helps her calm down during very stressful times. Dialysis and COVID have caused significant added stress for her. Appetite has improved some, now using Boost drinks on occasion as well.    Depression screen [facility] 2/9 09/03/2017 03/19/2017 08/29/2016  Decreased Interest 2 0 0  Down, Depressed, Hopeless 0 0 0  PHQ - 2 Score 2 0 0  Altered sleeping 0 - -  Tired, decreased energy 1 - -  Change in appetite 0 - -  Feeling bad or failure about yourself  0 - -  Trouble concentrating 0 - -  Moving slowly or fidgety/restless 0 - -  Suicidal thoughts 0 - -  PHQ-9 Score 3 - -  Difficult doing work/chores Not difficult at all - -   GAD 7 : Generalized Anxiety Score 07/14/2018  Nervous, Anxious, on Edge 0  Control/stop worrying 0  Worry too much - different things 0  Trouble relaxing 0  Restless 0  Easily annoyed or irritable 1  Afraid - awful might happen 0  Total GAD 7 Score 1  Anxiety Difficulty Not difficult at all     Relevant past medical, surgical, family and social history reviewed and updated as indicated. Interim medical history since our last visit reviewed. Allergies and medications reviewed and updated.  Review of Systems  Per HPI unless specifically indicated above     Objective:    There were no vitals taken for this visit.  Wt Readings from Last 3 Encounters:  06/09/18 125 lb (56.7 kg)  01/28/18 120 lb 12.8 oz (54.8 kg)  01/26/18 121 lb (54.9 kg)    Physical Exam  Unable to perform PE as patient didn't have access to video technology for today's visit  Results for orders  placed or performed in visit on 01/26/18  Comprehensive metabolic panel  Result Value Ref Range   Sodium 136 135 - 145 mmol/L   Potassium 4.2 3.5 - 5.1 mmol/L   Chloride 103 98 - 111 mmol/L   CO2 22 22 - 32 mmol/L   Glucose, Bld 84 70 - 99 mg/dL   BUN 53 (H) 8 - 23 mg/dL   Creatinine, Ser 5.05 (H) 0.44 - 1.00 mg/dL   Calcium 10.4 (H) 8.9 - 10.3 mg/dL   Total Protein 8.1 6.5 - 8.1 g/dL   Albumin 3.3 (L) 3.5 - 5.0 g/dL   AST 16 15 - 41 U/L   ALT 9 0 - 44 U/L   Alkaline Phosphatase 33 (L) 38 - 126 U/L   Total Bilirubin 0.3 0.3 - 1.2 mg/dL   GFR calc non Af Amer 7 (L) >60  mL/min   GFR calc Af Amer 8 (L) >60 mL/min   Anion gap 11 5 - 15  CBC with Differential/Platelet  Result Value Ref Range   WBC 6.4 4.0 - 10.5 K/uL   RBC 3.33 (L) 3.87 - 5.11 MIL/uL   Hemoglobin 8.9 (L) 12.0 - 15.0 g/dL   HCT 28.9 (L) 36.0 - 46.0 %   MCV 86.8 80.0 - 100.0 fL   MCH 26.7 26.0 - 34.0 pg   MCHC 30.8 30.0 - 36.0 g/dL   RDW 17.0 (H) 11.5 - 15.5 %   Platelets 250 150 - 400 K/uL   nRBC 0.0 0.0 - 0.2 %   Neutrophils Relative % 53 %   Neutro Abs 3.5 1.7 - 7.7 K/uL   Lymphocytes Relative 31 %   Lymphs Abs 2.0 0.7 - 4.0 K/uL   Monocytes Relative 11 %   Monocytes Absolute 0.7 0.1 - 1.0 K/uL   Eosinophils Relative 3 %   Eosinophils Absolute 0.2 0.0 - 0.5 K/uL   Basophils Relative 1 %   Basophils Absolute 0.0 0.0 - 0.1 K/uL   Immature Granulocytes 1 %   Abs Immature Granulocytes 0.04 0.00 - 0.07 K/uL  Iron and TIBC  Result Value Ref Range   Iron 49 28 - 170 ug/dL   TIBC 178 (L) 250 - 450 ug/dL   Saturation Ratios 28 10.4 - 31.8 %   UIBC 129 ug/dL  Ferritin  Result Value Ref Range   Ferritin 362 (H) 11 - 307 ng/mL      Assessment & Plan:   Problem List Items Addressed This Visit      Respiratory   Allergic rhinitis - Primary    Significantly improved with addition of zyrtec and flonase regularly. Continue current regimen        Other   Chronic anxiety    Currently using klonopin about 3 days/week. Goal of 60 tabs for 6 months or longer. Discussed adding on remeron daily to help keep sxs under better control and saving klonopin for rare as needed use for severe episodes. Pt will consider and revisit this at her upcoming appt in July with PCP          Follow up plan: Return for as scheduled.

## 2018-07-14 NOTE — Assessment & Plan Note (Signed)
Currently using klonopin about 3 days/week. Goal of 60 tabs for 6 months or longer. Discussed adding on remeron daily to help keep sxs under better control and saving klonopin for rare as needed use for severe episodes. Pt will consider and revisit this at her upcoming appt in July with PCP

## 2018-07-15 DIAGNOSIS — Z992 Dependence on renal dialysis: Secondary | ICD-10-CM | POA: Diagnosis not present

## 2018-07-15 DIAGNOSIS — N186 End stage renal disease: Secondary | ICD-10-CM | POA: Diagnosis not present

## 2018-07-16 DIAGNOSIS — N186 End stage renal disease: Secondary | ICD-10-CM | POA: Diagnosis not present

## 2018-07-16 DIAGNOSIS — Z992 Dependence on renal dialysis: Secondary | ICD-10-CM | POA: Diagnosis not present

## 2018-07-17 DIAGNOSIS — Z992 Dependence on renal dialysis: Secondary | ICD-10-CM | POA: Diagnosis not present

## 2018-07-17 DIAGNOSIS — Z23 Encounter for immunization: Secondary | ICD-10-CM | POA: Diagnosis not present

## 2018-07-17 DIAGNOSIS — N186 End stage renal disease: Secondary | ICD-10-CM | POA: Diagnosis not present

## 2018-07-20 DIAGNOSIS — Z992 Dependence on renal dialysis: Secondary | ICD-10-CM | POA: Diagnosis not present

## 2018-07-20 DIAGNOSIS — N186 End stage renal disease: Secondary | ICD-10-CM | POA: Diagnosis not present

## 2018-07-20 DIAGNOSIS — Z23 Encounter for immunization: Secondary | ICD-10-CM | POA: Diagnosis not present

## 2018-07-20 NOTE — Assessment & Plan Note (Signed)
Significantly improved with addition of zyrtec and flonase regularly. Continue current regimen

## 2018-07-22 DIAGNOSIS — Z23 Encounter for immunization: Secondary | ICD-10-CM | POA: Diagnosis not present

## 2018-07-22 DIAGNOSIS — N186 End stage renal disease: Secondary | ICD-10-CM | POA: Diagnosis not present

## 2018-07-22 DIAGNOSIS — Z992 Dependence on renal dialysis: Secondary | ICD-10-CM | POA: Diagnosis not present

## 2018-07-25 DIAGNOSIS — Z992 Dependence on renal dialysis: Secondary | ICD-10-CM | POA: Diagnosis not present

## 2018-07-25 DIAGNOSIS — Z23 Encounter for immunization: Secondary | ICD-10-CM | POA: Diagnosis not present

## 2018-07-25 DIAGNOSIS — N186 End stage renal disease: Secondary | ICD-10-CM | POA: Diagnosis not present

## 2018-07-27 DIAGNOSIS — Z992 Dependence on renal dialysis: Secondary | ICD-10-CM | POA: Diagnosis not present

## 2018-07-27 DIAGNOSIS — N186 End stage renal disease: Secondary | ICD-10-CM | POA: Diagnosis not present

## 2018-07-27 DIAGNOSIS — Z23 Encounter for immunization: Secondary | ICD-10-CM | POA: Diagnosis not present

## 2018-07-30 DIAGNOSIS — N186 End stage renal disease: Secondary | ICD-10-CM | POA: Diagnosis not present

## 2018-07-30 DIAGNOSIS — Z992 Dependence on renal dialysis: Secondary | ICD-10-CM | POA: Diagnosis not present

## 2018-07-30 DIAGNOSIS — Z23 Encounter for immunization: Secondary | ICD-10-CM | POA: Diagnosis not present

## 2018-07-31 ENCOUNTER — Other Ambulatory Visit: Payer: Self-pay

## 2018-07-31 ENCOUNTER — Encounter (INDEPENDENT_AMBULATORY_CARE_PROVIDER_SITE_OTHER): Payer: Self-pay | Admitting: Vascular Surgery

## 2018-07-31 ENCOUNTER — Ambulatory Visit (INDEPENDENT_AMBULATORY_CARE_PROVIDER_SITE_OTHER): Payer: Medicare Other | Admitting: Vascular Surgery

## 2018-07-31 ENCOUNTER — Ambulatory Visit (INDEPENDENT_AMBULATORY_CARE_PROVIDER_SITE_OTHER): Payer: Medicare Other

## 2018-07-31 VITALS — BP 144/75 | HR 79 | Resp 16 | Ht 63.5 in | Wt 121.6 lb

## 2018-07-31 DIAGNOSIS — N185 Chronic kidney disease, stage 5: Secondary | ICD-10-CM

## 2018-07-31 DIAGNOSIS — Z992 Dependence on renal dialysis: Secondary | ICD-10-CM | POA: Diagnosis not present

## 2018-07-31 DIAGNOSIS — N186 End stage renal disease: Secondary | ICD-10-CM | POA: Diagnosis not present

## 2018-07-31 DIAGNOSIS — Z87891 Personal history of nicotine dependence: Secondary | ICD-10-CM | POA: Diagnosis not present

## 2018-07-31 DIAGNOSIS — E785 Hyperlipidemia, unspecified: Secondary | ICD-10-CM | POA: Diagnosis not present

## 2018-07-31 DIAGNOSIS — I120 Hypertensive chronic kidney disease with stage 5 chronic kidney disease or end stage renal disease: Secondary | ICD-10-CM | POA: Diagnosis not present

## 2018-07-31 DIAGNOSIS — Z79899 Other long term (current) drug therapy: Secondary | ICD-10-CM

## 2018-07-31 DIAGNOSIS — I129 Hypertensive chronic kidney disease with stage 1 through stage 4 chronic kidney disease, or unspecified chronic kidney disease: Secondary | ICD-10-CM

## 2018-07-31 DIAGNOSIS — Z23 Encounter for immunization: Secondary | ICD-10-CM | POA: Diagnosis not present

## 2018-07-31 NOTE — Patient Instructions (Signed)
Vascular Access for Hemodialysis        A vascular access is a connection to the blood inside your blood vessels that allows blood to be easily removed from your body and returned to your body during kidney dialysis (hemodialysis). Hemodialysis is a procedure in which a machine outside of the body filters the blood of a person whose kidneys are no longer working properly. There are three types of vascular accesses:  Arteriovenous fistula (AVF). This is a connection between an artery and a vein (usually in the arm) that is made by sewing them together. Blood in the artery flows directly into the vein, causing it to get larger over time. This makes it easier for the vein to be used for hemodialysis. An arteriovenous fistula takes 1-6 months to develop after surgery.  Arteriovenous graft (AVG). This is a connection between an artery and a vein in the arm that is made with a tube. An arteriovenous graft can be used within 2-3 weeks of surgery.  Venous catheter. This is a thin, flexible tube that is placed in a large vein (usually in the neck, chest, or groin). A venous catheter for hemodialysis contains two tubes that come out of the skin. A venous catheter can be used right away. It is usually used as a temporary access if you need hemodialysis before a fistula or graft has developed, or if kidney failure is sudden (acute) and likely to improve without the need for long-term dialysis. It may also be used as a permanent access if a fistula or graft cannot be created. Which type of access is best for me? The type of access that is best for you depends on the size and strength of your veins, your age, and any other health problems that you have, such as diabetes. An ultrasound test may be used to look at your veins to help make this decision. A fistula is usually the preferred type of access. It can last several years and is less likely than the other types of accesses to become infected or to cause a blood  clot within a blood vessel (thrombosis). However, a fistula is not an option for everyone. If your veins are not the right size or if the fistula does not develop properly, a graft may be used instead. Grafts require you to have strong veins. If your veins are not strong enough for a graft, a catheter may be used. Catheters are more likely than fistulas and grafts to become infected or to have a thrombosis. Sometimes, only one type of access is an option. Your health care provider will help you determine which type of access is best for you. How is a vascular access used? The way that the access is used depends on the type of access:  If the access is a fistula or graft, two needles are inserted through the skin into the access before each hemodialysis session. Blood leaves the body through one of the needles and travels through a tube to the hemodialysis machine (dialyzer). Then it flows through another tube and returns to the body through the second needle.  If the access is a catheter, one tube is connected directly to the tube that leads to the dialyzer, and the other tube is connected to a tube that leads away from the dialyzer. Blood leaves the body through one tube and returns to the body through the other. What problems can occur with vascular access?  A blood clot within a blood vessel (thrombosis).   Thrombosis can lead to a narrowing of a blood vessel (stenosis). If thrombosis occurs frequently, another access site may be created as a backup.  Infection.  Heart enlargement (cardiomegaly) and heart failure. Changes in blood flow may cause an increase in blood pressure or heart rate, making your heart work harder to pump blood. These problems are most likely to occur with a venous catheter and least likely to occur with an arteriovenous fistula. How do I care for my vascular access?  Wear a medical alert bracelet. In case of an emergency, this bracelet tells health care providers that you  are a dialysis patient and allows them to care for your veins appropriately. If you have a graft or fistula:  A "bruit" is a noise that is heard with a stethoscope, and a "thrill" is a vibration that is felt over the graft or fistula. The presence of the bruit and thrill indicates that the access is working. You will be taught to feel for the thrill each day. If this is not felt, the access may be clotted. Call your health care provider.  Keep your arm straight and raised (elevated) above your heart while the access site is healing.  You may freely use the arm where your vascular access is located after the site heals. Keep the following in mind: ? Avoid pressure on the arm. ? Avoid lifting heavy objects with the arm. ? Avoid sleeping on the arm. ? Avoid wearing tight-sleeved shirts or jewelry around the graft or fistula.  Do not allow blood pressure monitoring or needle punctures on the side where the graft or fistula is located.  With permission from your health care provider, you may do exercises to help with blood flow through a fistula. These exercises involve squeezing a rubber ball or other soft objects as instructed.  Wash your access site according to directions from your health care provider. If you have a venous catheter:  Keep the insertion site clean and dry at all times.  If you are told you can shower after the site heals, use a protective covering over the catheter to keep it dry.  Follow directions from your health care provider for bandage (dressing) changes.  Ask your health care provider what activities are safe for you. You may be restricted from lifting or making repetitive arm movements on the side with the catheter. Contact a health care provider if:  Swelling around the graft or fistula gets worse.  You develop new pain.  Your catheter gets damaged. Get help right away if:  You have pain, numbness, an unusual pale skin color, or blue fingers or sores at  the tips of your fingers in the hand on the side of your fistula.  You have chills.  You have a fever.  You have pus or other fluid (drainage) at the vascular access site.  You develop skin redness or red streaking on the skin around, above, or below the vascular access.  You have bleeding at the vascular access that cannot be easily controlled.  Your catheter gets pulled out of place.  You feel your heart racing or skipping beats.  You have chest pain. Summary  A vascular access is a connection to the blood inside your blood vessels that allows blood to be easily removed from your body and returned to your body during kidney dialysis (hemodialysis).  There are three types of vascular accesses.The type of access that is best for you depends on the size and strength of your   veins, your age, and any other health problems that you have, such as diabetes.  A fistula is usually the preferred type of access, although it is not an option for everyone. It can last several years and is less likely than the other types of accesses to become infected or to cause a blood clot within a blood vessel (thrombosis).  Wear a medical alert bracelet. In case of an emergency, this tells health care providers that you are a dialysis patient. This information is not intended to replace advice given to you by your health care provider. Make sure you discuss any questions you have with your health care provider. Document Released: 05/25/2002 Document Revised: 03/29/2016 Document Reviewed: 03/29/2016 Elsevier Interactive Patient Education  2019 [employer].

## 2018-07-31 NOTE — Assessment & Plan Note (Signed)
blood pressure control important in reducing the progression of atherosclerotic disease. On appropriate oral medications.  

## 2018-07-31 NOTE — Assessment & Plan Note (Signed)
Her duplex today shows some moderately elevated velocities in the perianastomotic cephalic vein but the fistula is otherwise patent without significant stenosis.  At this time, I would not recommend any intervention for this moderate stenosis as her access is working well without any issues.  We will repeat a duplex in about 6 months to follow this and I will be happy to see her at any point sooner in the interim if she has issues with her access.

## 2018-07-31 NOTE — Progress Notes (Signed)
MRN : [id number]  Marissa Hess is a 84 y.o. (1934/05/19) female who presents with chief complaint of  Chief Complaint  Patient presents with  . Follow-up    40month HDA  .  History of Present Illness: Patient returns today in follow up of her dialysis access.  Other than having prominent veins in her shoulder and clavicle area which irritate her when she wears a bra, she is having no issues from her AV fistula.  It has good flows.  No prolonged bleeding.  No difficulty with access.  Her duplex today shows some moderately elevated velocities in the perianastomotic cephalic vein but the fistula is otherwise patent without significant stenosis.  Current Outpatient Medications  Medication Sig Dispense Refill  . Amino Acid Infusion (PROSOL) 20 % SOLN     . aspirin EC 81 MG tablet Take 81 mg by mouth daily.    . calcitRIOL (ROCALTROL) 0.5 MCG capsule Take 0.5 mcg by mouth daily.     . carvedilol (COREG) 25 MG tablet Take 1 tablet (25 mg total) by mouth 2 (two) times daily with a meal. 60 tablet 12  . cetirizine (ZYRTEC) 10 MG tablet Take 1 tablet (10 mg total) by mouth daily. 30 tablet 2  . cholecalciferol (VITAMIN D) 1000 units tablet Take 1,000 Units by mouth daily.     . clonazePAM (KLONOPIN) 0.5 MG tablet Take 1 tablet (0.5 mg total) by mouth at bedtime as needed for anxiety. 30 tablet 0  . ferrous sulfate (SLOW RELEASE IRON) 160 (50 Fe) MG TBCR SR tablet Take 1 tablet by mouth daily.     . fluticasone (FLONASE) 50 MCG/ACT nasal spray Place 1 spray into both nostrils 2 (two) times daily. 16 g 6  . folic acid (FOLVITE) 1 MG tablet Take 1 tablet (1 mg total) by mouth daily. 30 tablet 12   No current facility-administered medications for this visit.     Past Medical History:  Diagnosis Date  . Allergy   . Arthritis   . Chronic kidney disease    CKD V  . GERD (gastroesophageal reflux disease)   . Hypertension   . Lupus ([place]) 2002  . Macular degeneration of left eye   . Myocardial  infarction ([place]) 2007  . Osteoporosis     Past Surgical History:  Procedure Laterality Date  . A/V FISTULAGRAM Right 01/16/2018   Procedure: A/V FISTULAGRAM;  Surgeon: [name], MD;  Location: [facility];  Service: Cardiovascular;  Laterality: Right;  . ABDOMINAL HYSTERECTOMY    . AV FISTULA PLACEMENT Right 12/17/2017   Procedure: ARTERIOVENOUS (AV) FISTULA CREATION ( BRACHIO CEPHALIC POSS. BRACHIO BASILIC );  Surgeon: Algernon Huxley, MD;  Location: [facility];  Service: Vascular;  Laterality: Right;  . CATARACT EXTRACTION W/PHACO Right 01/03/2016   Procedure: CATARACT EXTRACTION PHACO AND INTRAOCULAR LENS PLACEMENT ([place]);  Surgeon: Leandrew Koyanagi, MD;  Location: [facility];  Service: Ophthalmology;  Laterality: Right;  . COLON SURGERY     intestine became twisted after hernia sugery  . EYE SURGERY     cataract  . HERNIA REPAIR    . TEMPORAL ARTERY BIOPSY / LIGATION    . TONSILLECTOMY      Social History Social History   Tobacco Use  . Smoking status: Former Smoker    Types: Cigarettes    Start date: 09/11/1984    Last attempt to quit: 09/26/1994    Years since quitting: 23.8  . Smokeless tobacco: Never Used  Substance Use Topics  . Alcohol use: No    Alcohol/week: 0.0 standard drinks  . Drug use: No    Family History Family History  Problem Relation Age of Onset  . Stomach cancer Mother   . Rheum arthritis Father   . Breast cancer Neg Hx     Allergies  Allergen Reactions  . Codeine Itching  . Ibuprofen     Avoids "because of lupus"  . Sulfa Antibiotics Diarrhea  . Vioxx [Rofecoxib]     Avoids "because of lupus"  . Celebrex [Celecoxib] Rash    Avoids "because of lupus"  . Penicillins Rash     REVIEW OF SYSTEMS (Negative unless checked)  Constitutional: [] Weight loss  [] Fever  [] Chills Cardiac: [] Chest pain   [] Chest pressure   [] Palpitations   [] Shortness of breath when laying flat   [] Shortness of breath at rest   [] Shortness of  breath with exertion. Vascular:  [] Pain in legs with walking   [] Pain in legs at rest   [] Pain in legs when laying flat   [] Claudication   [] Pain in feet when walking  [] Pain in feet at rest  [] Pain in feet when laying flat   [] History of DVT   [] Phlebitis   [] Swelling in legs   [] Varicose veins   [] Non-healing ulcers Pulmonary:   [] Uses home oxygen   [] Productive cough   [] Hemoptysis   [] Wheeze  [] COPD   [] Asthma Neurologic:  [] Dizziness  [] Blackouts   [] Seizures   [] History of stroke   [] History of TIA  [] Aphasia   [] Temporary blindness   [] Dysphagia   [] Weakness or numbness in arms   [] Weakness or numbness in legs Musculoskeletal:  [x] Arthritis   [] Joint swelling   [] Joint pain   [] Low back pain Hematologic:  [] Easy bruising  [] Easy bleeding   [] Hypercoagulable state   [x] Anemic   Gastrointestinal:  [] Blood in stool   [] Vomiting blood  [x] Gastroesophageal reflux/heartburn   [] Abdominal pain Genitourinary:  [x] Chronic kidney disease   [] Difficult urination  [] Frequent urination  [] Burning with urination   [] Hematuria Skin:  [] Rashes   [] Ulcers   [] Wounds Psychological:  [] History of anxiety   []  History of major depression.  Physical Examination  BP (!) 144/75 (BP Location: Left Arm)   Pulse 79   Resp 16   Ht 5' 3.5" (1.613 m)   Wt 121 lb 9.6 oz (55.2 kg)   BMI 21.20 kg/m  Gen:  WD/WN, NAD. Appears younger than stated age. Head: [place]/AT, No temporalis wasting. Ear/Nose/Throat: Hearing grossly intact, nares w/o erythema or drainage Eyes: Conjunctiva clear. Sclera non-icteric Neck: Supple.  Trachea midline Pulmonary:  Good air movement, no use of accessory muscles.  Cardiac: RRR, no JVD Vascular: good thrill right arm AVF Vessel Right Left  Radial Palpable Palpable                   Musculoskeletal: M/S 5/5 throughout.  No deformity or atrophy. No edema. Neurologic: Sensation grossly intact in extremities.  Symmetrical.  Speech is fluent.  Psychiatric: Judgment intact, Mood &  affect appropriate for pt's clinical situation. Dermatologic: No rashes or ulcers noted.  No cellulitis or open wounds.       Labs No results found for this or any previous visit (from the past 2160 hour(s)).  Radiology No results found.  Assessment/Plan  Hypertensive nephropathy blood pressure control important in reducing the progression of atherosclerotic disease. On appropriate oral medications.   Hyperlipemia lipid control important in reducing the progression of atherosclerotic  disease.    ESRD (end stage renal disease) ([place]) Her duplex today shows some moderately elevated velocities in the perianastomotic cephalic vein but the fistula is otherwise patent without significant stenosis.  At this time, I would not recommend any intervention for this moderate stenosis as her access is working well without any issues.  We will repeat a duplex in about 6 months to follow this and I will be happy to see her at any point sooner in the interim if she has issues with her access.    Leotis Pain, MD  07/31/2018 10:45 AM    This note was created with Dragon medical transcription system.  Any errors from dictation are purely unintentional

## 2018-07-31 NOTE — Assessment & Plan Note (Signed)
lipid control important in reducing the progression of atherosclerotic disease.   

## 2018-08-03 DIAGNOSIS — N186 End stage renal disease: Secondary | ICD-10-CM | POA: Diagnosis not present

## 2018-08-03 DIAGNOSIS — Z23 Encounter for immunization: Secondary | ICD-10-CM | POA: Diagnosis not present

## 2018-08-03 DIAGNOSIS — Z992 Dependence on renal dialysis: Secondary | ICD-10-CM | POA: Diagnosis not present

## 2018-08-05 DIAGNOSIS — N186 End stage renal disease: Secondary | ICD-10-CM | POA: Diagnosis not present

## 2018-08-05 DIAGNOSIS — Z992 Dependence on renal dialysis: Secondary | ICD-10-CM | POA: Diagnosis not present

## 2018-08-05 DIAGNOSIS — Z23 Encounter for immunization: Secondary | ICD-10-CM | POA: Diagnosis not present

## 2018-08-07 DIAGNOSIS — N186 End stage renal disease: Secondary | ICD-10-CM | POA: Diagnosis not present

## 2018-08-07 DIAGNOSIS — Z992 Dependence on renal dialysis: Secondary | ICD-10-CM | POA: Diagnosis not present

## 2018-08-07 DIAGNOSIS — Z23 Encounter for immunization: Secondary | ICD-10-CM | POA: Diagnosis not present

## 2018-08-10 DIAGNOSIS — Z23 Encounter for immunization: Secondary | ICD-10-CM | POA: Diagnosis not present

## 2018-08-10 DIAGNOSIS — N186 End stage renal disease: Secondary | ICD-10-CM | POA: Diagnosis not present

## 2018-08-10 DIAGNOSIS — Z992 Dependence on renal dialysis: Secondary | ICD-10-CM | POA: Diagnosis not present

## 2018-08-12 DIAGNOSIS — N186 End stage renal disease: Secondary | ICD-10-CM | POA: Diagnosis not present

## 2018-08-12 DIAGNOSIS — Z23 Encounter for immunization: Secondary | ICD-10-CM | POA: Diagnosis not present

## 2018-08-12 DIAGNOSIS — Z992 Dependence on renal dialysis: Secondary | ICD-10-CM | POA: Diagnosis not present

## 2018-08-14 DIAGNOSIS — N186 End stage renal disease: Secondary | ICD-10-CM | POA: Diagnosis not present

## 2018-08-14 DIAGNOSIS — Z23 Encounter for immunization: Secondary | ICD-10-CM | POA: Diagnosis not present

## 2018-08-14 DIAGNOSIS — Z992 Dependence on renal dialysis: Secondary | ICD-10-CM | POA: Diagnosis not present

## 2018-08-16 DIAGNOSIS — N186 End stage renal disease: Secondary | ICD-10-CM | POA: Diagnosis not present

## 2018-08-16 DIAGNOSIS — Z992 Dependence on renal dialysis: Secondary | ICD-10-CM | POA: Diagnosis not present

## 2018-08-17 DIAGNOSIS — N186 End stage renal disease: Secondary | ICD-10-CM | POA: Diagnosis not present

## 2018-08-17 DIAGNOSIS — Z992 Dependence on renal dialysis: Secondary | ICD-10-CM | POA: Diagnosis not present

## 2018-08-20 ENCOUNTER — Other Ambulatory Visit: Payer: Self-pay

## 2018-08-20 ENCOUNTER — Ambulatory Visit (INDEPENDENT_AMBULATORY_CARE_PROVIDER_SITE_OTHER): Payer: Medicare Other | Admitting: Family Medicine

## 2018-08-20 ENCOUNTER — Ambulatory Visit: Payer: Medicare Other | Admitting: Family Medicine

## 2018-08-20 ENCOUNTER — Encounter: Payer: Self-pay | Admitting: Family Medicine

## 2018-08-20 DIAGNOSIS — Z992 Dependence on renal dialysis: Secondary | ICD-10-CM | POA: Diagnosis not present

## 2018-08-20 DIAGNOSIS — M329 Systemic lupus erythematosus, unspecified: Secondary | ICD-10-CM

## 2018-08-20 DIAGNOSIS — T82590D Other mechanical complication of surgically created arteriovenous fistula, subsequent encounter: Secondary | ICD-10-CM

## 2018-08-20 DIAGNOSIS — N186 End stage renal disease: Secondary | ICD-10-CM | POA: Diagnosis not present

## 2018-08-20 DIAGNOSIS — I739 Peripheral vascular disease, unspecified: Secondary | ICD-10-CM

## 2018-08-20 MED ORDER — GABAPENTIN 100 MG PO CAPS: ORAL_CAPSULE | ORAL | 1 refills | Status: DC

## 2018-08-20 NOTE — Assessment & Plan Note (Signed)
Try low dose gabapen

## 2018-08-20 NOTE — Assessment & Plan Note (Signed)
With fatague

## 2018-08-20 NOTE — Assessment & Plan Note (Signed)
Followed by vascular 

## 2018-08-20 NOTE — Progress Notes (Signed)
There were no vitals taken for this visit.   Subjective:    Patient ID: Marissa Hess, female    DOB: 12/29/1934, 84 y.o.   MRN: [id number]  HPI: Marissa Hess is a 84 y.o. female  Many troubles  Telemedicine using audio/video telecommunications for a synchronous communication visit. Today's visit due to COVID-19 isolation precautions I connected with and verified that I am speaking with the correct person using two identifiers.   I discussed the limitations, risks, security and privacy concerns of performing an evaluation and management service by telecommunication and the availability of in person appointments. I also discussed with the patient that there may be a patient responsible charge related to this service. The patient expressed understanding and agreed to proceed. The patient's location is home. I am at home.  Patient with concerned about marked fatigue feeling drained in or out after dialysis.  This lasts throughout the evening patient has no appetite during this time has been giving protein and potassium supplements from dialysis. Patient complains of restless legs and has marked issues with legs twitching especially during dialysis has taken clonazepam in the past but concerned about that building in her system and causing side effects.  So stopping clonazepam Patient also with ears feeling stopped up will use some over-the-counter Debrox concerned about patient and COVID-19 exposure in a medical setting.  Relevant past medical, surgical, family and social history reviewed and updated as indicated. Interim medical history since our last visit reviewed. Allergies and medications reviewed and updated.  Review of Systems  Constitutional: Positive for activity change and fatigue. Negative for chills and fever.  HENT: Negative.   Respiratory: Negative.   Cardiovascular: Negative.     Per HPI unless specifically indicated above     Objective:    There were no vitals  taken for this visit.  Wt Readings from Last 3 Encounters:  07/31/18 121 lb 9.6 oz (55.2 kg)  06/09/18 125 lb (56.7 kg)  01/28/18 120 lb 12.8 oz (54.8 kg)    Physical Exam  Results for orders placed or performed in visit on 01/26/18  Comprehensive metabolic panel  Result Value Ref Range   Sodium 136 135 - 145 mmol/L   Potassium 4.2 3.5 - 5.1 mmol/L   Chloride 103 98 - 111 mmol/L   CO2 22 22 - 32 mmol/L   Glucose, Bld 84 70 - 99 mg/dL   BUN 53 (H) 8 - 23 mg/dL   Creatinine, Ser 5.05 (H) 0.44 - 1.00 mg/dL   Calcium 10.4 (H) 8.9 - 10.3 mg/dL   Total Protein 8.1 6.5 - 8.1 g/dL   Albumin 3.3 (L) 3.5 - 5.0 g/dL   AST 16 15 - 41 U/L   ALT 9 0 - 44 U/L   Alkaline Phosphatase 33 (L) 38 - 126 U/L   Total Bilirubin 0.3 0.3 - 1.2 mg/dL   GFR calc non Af Amer 7 (L) >60 mL/min   GFR calc Af Amer 8 (L) >60 mL/min   Anion gap 11 5 - 15  CBC with Differential/Platelet  Result Value Ref Range   WBC 6.4 4.0 - 10.5 K/uL   RBC 3.33 (L) 3.87 - 5.11 MIL/uL   Hemoglobin 8.9 (L) 12.0 - 15.0 g/dL   HCT 28.9 (L) 36.0 - 46.0 %   MCV 86.8 80.0 - 100.0 fL   MCH 26.7 26.0 - 34.0 pg   MCHC 30.8 30.0 - 36.0 g/dL   RDW 17.0 (H) 11.5 - 15.5 %  Platelets 250 150 - 400 K/uL   nRBC 0.0 0.0 - 0.2 %   Neutrophils Relative % 53 %   Neutro Abs 3.5 1.7 - 7.7 K/uL   Lymphocytes Relative 31 %   Lymphs Abs 2.0 0.7 - 4.0 K/uL   Monocytes Relative 11 %   Monocytes Absolute 0.7 0.1 - 1.0 K/uL   Eosinophils Relative 3 %   Eosinophils Absolute 0.2 0.0 - 0.5 K/uL   Basophils Relative 1 %   Basophils Absolute 0.0 0.0 - 0.1 K/uL   Immature Granulocytes 1 %   Abs Immature Granulocytes 0.04 0.00 - 0.07 K/uL  Iron and TIBC  Result Value Ref Range   Iron 49 28 - 170 ug/dL   TIBC 178 (L) 250 - 450 ug/dL   Saturation Ratios 28 10.4 - 31.8 %   UIBC 129 ug/dL  Ferritin  Result Value Ref Range   Ferritin 362 (H) 11 - 307 ng/mL      Assessment & Plan:   Problem List Items Addressed This Visit      Cardiovascular  and Mediastinum   Malfunction of arteriovenous dialysis fistula (HCC)    Followed by vascular        Genitourinary   ESRD (end stage renal disease) ([place])    On dialysis with marked fatigue patient will review with nephrology, reviewed labs which are relatively stable        Other   Claudication of both lower extremities (HCC)    Try low dose gabapen      Relevant Medications   gabapentin (NEURONTIN) 100 MG capsule   Dependence on renal dialysis ([place])    With fatague       Other Visit Diagnoses    Systemic lupus erythematosus, unspecified SLE type, unspecified organ involvement status ([place])   (Chronic)      I discussed the assessment and treatment plan with the patient. The patient was provided an opportunity to ask questions and all were answered. The patient agreed with the plan and demonstrated an understanding of the instructions.   The patient was advised to call back or seek an in-person evaluation if the symptoms worsen or if the condition fails to improve as anticipated.   I provided 21+ minutes of time during this encounter.   Follow up plan: Return in about 3 months (around 11/20/2018).

## 2018-08-20 NOTE — Assessment & Plan Note (Signed)
On dialysis with marked fatigue patient will review with nephrology, reviewed labs which are relatively stable

## 2018-08-24 DIAGNOSIS — N186 End stage renal disease: Secondary | ICD-10-CM | POA: Diagnosis not present

## 2018-08-24 DIAGNOSIS — Z992 Dependence on renal dialysis: Secondary | ICD-10-CM | POA: Diagnosis not present

## 2018-08-26 DIAGNOSIS — Z992 Dependence on renal dialysis: Secondary | ICD-10-CM | POA: Diagnosis not present

## 2018-08-26 DIAGNOSIS — N186 End stage renal disease: Secondary | ICD-10-CM | POA: Diagnosis not present

## 2018-08-28 DIAGNOSIS — Z992 Dependence on renal dialysis: Secondary | ICD-10-CM | POA: Diagnosis not present

## 2018-08-28 DIAGNOSIS — N186 End stage renal disease: Secondary | ICD-10-CM | POA: Diagnosis not present

## 2018-08-31 DIAGNOSIS — Z992 Dependence on renal dialysis: Secondary | ICD-10-CM | POA: Diagnosis not present

## 2018-08-31 DIAGNOSIS — N186 End stage renal disease: Secondary | ICD-10-CM | POA: Diagnosis not present

## 2018-09-02 DIAGNOSIS — Z992 Dependence on renal dialysis: Secondary | ICD-10-CM | POA: Diagnosis not present

## 2018-09-02 DIAGNOSIS — N186 End stage renal disease: Secondary | ICD-10-CM | POA: Diagnosis not present

## 2018-09-04 DIAGNOSIS — Z992 Dependence on renal dialysis: Secondary | ICD-10-CM | POA: Diagnosis not present

## 2018-09-04 DIAGNOSIS — N186 End stage renal disease: Secondary | ICD-10-CM | POA: Diagnosis not present

## 2018-09-07 DIAGNOSIS — N186 End stage renal disease: Secondary | ICD-10-CM | POA: Diagnosis not present

## 2018-09-07 DIAGNOSIS — Z992 Dependence on renal dialysis: Secondary | ICD-10-CM | POA: Diagnosis not present

## 2018-09-09 DIAGNOSIS — Z992 Dependence on renal dialysis: Secondary | ICD-10-CM | POA: Diagnosis not present

## 2018-09-09 DIAGNOSIS — N186 End stage renal disease: Secondary | ICD-10-CM | POA: Diagnosis not present

## 2018-09-10 ENCOUNTER — Ambulatory Visit (INDEPENDENT_AMBULATORY_CARE_PROVIDER_SITE_OTHER): Payer: Medicare Other

## 2018-09-10 VITALS — Wt 120.0 lb

## 2018-09-10 DIAGNOSIS — Z0000 Encounter for general adult medical examination without abnormal findings: Secondary | ICD-10-CM

## 2018-09-10 DIAGNOSIS — Z992 Dependence on renal dialysis: Secondary | ICD-10-CM

## 2018-09-10 DIAGNOSIS — N186 End stage renal disease: Secondary | ICD-10-CM

## 2018-09-10 NOTE — Progress Notes (Signed)
Subjective:   Marissa Hess is a 84 y.o. female who presents for Medicare Annual (Subsequent) preventive examination.  This visit is being conducted via phone call  - after an attmept to do on video chat - due to the COVID-19 pandemic. This patient has given me verbal consent via phone to conduct this visit, patient states they are participating from their home address. Some vital signs may be absent or patient reported.   Patient identification: identified by name, DOB, and current address.    Review of Systems:   Cardiac Risk Factors include: advanced age (>15men, >87 women);hypertension     Objective:     Vitals: Wt 120 lb (54.4 kg) Comment: patient reported  BMI 20.92 kg/m   Body mass index is 20.92 kg/m.  Advanced Directives 09/10/2018 01/16/2018 01/13/2018 01/13/2018 12/17/2017 12/11/2017 10/20/2017  Does Patient Have a Medical Advance Directive? No No No No No No No  Would patient like information on creating a medical advance directive? - No - Patient declined No - Patient declined - No - Patient declined No - Patient declined Yes (MAU/Ambulatory/Procedural Areas - Information given)    Tobacco Social History   Tobacco Use  Smoking Status Former Smoker  . Types: Cigarettes  . Start date: 09/11/1984  . Quit date: 09/26/1994  . Years since quitting: 23.9  Smokeless Tobacco Never Used     Counseling given: Not Answered   Clinical Intake:  Pre-visit preparation completed: Yes  Pain : No/denies pain     Nutritional Risks: None Diabetes: No  How often do you need to have someone help you when you read instructions, pamphlets, or other written materials from your doctor or pharmacy?: 1 - Never What is the last grade level you completed in school?: high school  Interpreter Needed?: No  Information entered by :: Tiffany Hill,LPN  Past Medical History:  Diagnosis Date  . Allergy   . Arthritis   . Chronic kidney disease    CKD V  . GERD (gastroesophageal  reflux disease)   . Hypertension   . Lupus ([place]) 2002  . Macular degeneration of left eye   . Myocardial infarction ([place]) 2007  . Osteoporosis    Past Surgical History:  Procedure Laterality Date  . A/V FISTULAGRAM Right 01/16/2018   Procedure: A/V FISTULAGRAM;  Surgeon: [name], MD;  Location: [facility];  Service: Cardiovascular;  Laterality: Right;  . ABDOMINAL HYSTERECTOMY    . AV FISTULA PLACEMENT Right 12/17/2017   Procedure: ARTERIOVENOUS (AV) FISTULA CREATION ( BRACHIO CEPHALIC POSS. BRACHIO BASILIC );  Surgeon: Algernon Huxley, MD;  Location: [facility];  Service: Vascular;  Laterality: Right;  . CATARACT EXTRACTION W/PHACO Right 01/03/2016   Procedure: CATARACT EXTRACTION PHACO AND INTRAOCULAR LENS PLACEMENT ([place]);  Surgeon: Leandrew Koyanagi, MD;  Location: [facility];  Service: Ophthalmology;  Laterality: Right;  . COLON SURGERY     intestine became twisted after hernia sugery  . EYE SURGERY     cataract  . HERNIA REPAIR    . TEMPORAL ARTERY BIOPSY / LIGATION    . TONSILLECTOMY     Family History  Problem Relation Age of Onset  . Stomach cancer Mother   . Rheum arthritis Father   . Breast cancer Neg Hx    Social History   Socioeconomic History  . Marital status: Married    Spouse name: Not on file  . Number of children: Not on file  . Years of education: Not on file  .  Highest education level: High school graduate  Occupational History    Comment: Recruitment consultant; [employer]; child care  Social Needs  . Financial resource strain: Not hard at all  . Food insecurity    Worry: Never true    Inability: Never true  . Transportation needs    Medical: No    Non-medical: No  Tobacco Use  . Smoking status: Former Smoker    Types: Cigarettes    Start date: 09/11/1984    Quit date: 09/26/1994    Years since quitting: 23.9  . Smokeless tobacco: Never Used  Substance and Sexual Activity  . Alcohol use: No    Alcohol/week: 0.0 standard drinks  .  Drug use: No  . Sexual activity: Not on file  Lifestyle  . Physical activity    Days per week: 0 days    Minutes per session: 0 min  . Stress: Not at all  Relationships  . Social connections    Talks on phone: More than three times a week    Gets together: More than three times a week    Attends religious service: More than 4 times per year    Active member of club or organization: Yes    Attends meetings of clubs or organizations: More than 4 times per year    Relationship status: Married  Other Topics Concern  . Not on file  Social History Narrative  . Not on file    Outpatient Encounter Medications as of 09/10/2018  Medication Sig  . Amino Acid Infusion (PROSOL) 20 % SOLN   . aspirin EC 81 MG tablet Take 81 mg by mouth daily.  . carvedilol (COREG) 25 MG tablet Take 1 tablet (25 mg total) by mouth 2 (two) times daily with a meal.  . cholecalciferol (VITAMIN D) 1000 units tablet Take 1,000 Units by mouth daily.   . ferrous sulfate (SLOW RELEASE IRON) 160 (50 Fe) MG TBCR SR tablet Take 1 tablet by mouth daily.   . fluticasone (FLONASE) 50 MCG/ACT nasal spray Place 1 spray into both nostrils 2 (two) times daily.  . folic acid (FOLVITE) 1 MG tablet Take 1 tablet (1 mg total) by mouth daily.  Marland Kitchen gabapentin (NEURONTIN) 100 MG capsule 1 cap every other day  . cetirizine (ZYRTEC) 10 MG tablet Take 1 tablet (10 mg total) by mouth daily. (Patient not taking: Reported on 09/10/2018)  . [DISCONTINUED] calcitRIOL (ROCALTROL) 0.5 MCG capsule Take 0.5 mcg by mouth daily.    No facility-administered encounter medications on file as of 09/10/2018.     Activities of Daily Living In your present state of health, do you have any difficulty performing the following activities: 09/10/2018 01/13/2018  Hearing? N N  Vision? Y N  Comment needs to go back to eye dr -  Difficulty concentrating or making decisions? N N  Walking or climbing stairs? N N  Dressing or bathing? N N  Doing errands, shopping?  N N  Preparing Food and eating ? N -  Using the Toilet? N -  In the past six months, have you accidently leaked urine? N -  Do you have problems with loss of bowel control? N -  Managing your Medications? N -  Managing your Finances? N -  Housekeeping or managing your Housekeeping? N -  Some recent data might be hidden    Patient Care Team: Guadalupe Maple, [name], [name], MD as Referring  Physician (Ophthalmology)    Assessment:   This is a routine wellness examination for [place].  Exercise Activities and Dietary recommendations Current Exercise Habits: The patient does not participate in regular exercise at present, Exercise limited by: None identified  Goals    . DIET - INCREASE WATER INTAKE     Recommended to only drink 32 oz of water due to dialysis     . Have 3 meals a day     Recommend eating 3 healthy meals a day.        Fall Risk: Fall Risk  09/10/2018 09/03/2017 03/19/2017 11/14/2016 08/29/2016  Falls in the past year? 1 No No No No  Number falls in past yr: 0 - - - -  Injury with Fall? 0 - - - -    FALL RISK PREVENTION PERTAINING TO THE HOME:  Any stairs in or around the home? yes If so, are there any without handrails? No   Home free of loose throw rugs in walkways, pet beds, electrical cords, etc? Yes  Adequate lighting in your home to reduce risk of falls? Yes   ASSISTIVE DEVICES UTILIZED TO PREVENT FALLS:  Life alert? no Use of a cane, walker or w/c? No  Grab bars in the bathroom? No  Shower chair or bench in shower? Yes  Elevated toilet seat or a handicapped toilet? No   DME ORDERS:  DME order needed?  No   TIMED UP AND GO:  Unable to perform   Depression Screen PHQ 2/9 Scores 09/10/2018 09/03/2017 03/19/2017 08/29/2016  PHQ - 2 Score 0 2 0 0  PHQ- 9 Score - 3 - -     Cognitive Function     6CIT Screen 09/10/2018 09/03/2017 08/29/2016  What Year? 0 points  0 points 0 points  What month? 0 points 0 points 0 points  What time? 0 points 0 points 0 points  Count back from 20 0 points 0 points 0 points  Months in reverse 0 points 0 points 0 points  Repeat phrase 0 points 2 points 2 points  Total Score 0 2 2    Immunization History  Administered Date(s) Administered  . Hepatitis B, adult 02/06/2018, 03/06/2018, 04/08/2018, 08/07/2018  . Influenza, High Dose Seasonal PF 12/20/2016  . Influenza,inj,Quad PF,6+ Mos 03/06/2015  . Influenza-Unspecified 12/17/2015, 01/16/2018, 02/13/2018  . PPD Test 02/06/2018, 02/16/2018  . Pneumococcal Polysaccharide-23 02/20/2018  . Pneumococcal-Unspecified 07/28/2008  . Tdap 05/26/2012    Qualifies for Shingles Vaccine? Yes  Zostavax completed n/a. Due for Shingrix. Education has been provided regarding the importance of this vaccine. Pt has been advised to call insurance company to determine out of pocket expense. Advised may also receive vaccine at local pharmacy or Health Dept. Verbalized acceptance and understanding.  Tdap: up to date   Flu Vaccine: up to date   Pneumococcal Vaccine: up to date   Screening Tests Health Maintenance  Topic Date Due  . INFLUENZA VACCINE  10/17/2018  . PNA vac Low Risk Adult (2 of 2 - PCV13) 02/21/2019  . TETANUS/TDAP  05/27/2022  . DEXA SCAN  Completed    Cancer Screenings:  Colorectal Screening: no longer required   Mammogram: no longer required  Bone Density: Completed 8/22/017.   Lung Cancer Screening: (Low Dose CT Chest recommended if Age 39-80 years, 30 pack-year currently smoking OR have quit w/in 15years.) does not qualify.   Additional Screening:  Hepatitis C Screening: does not qualify  Vision Screening: Recommended annual ophthalmology exams for early detection  of glaucoma and other disorders of the eye. Is the patient up to date with their annual eye exam?  Yes  Who is the provider or what is the name of the office in which the pt attends annual  eye exams? [facility]   Dental Screening: Recommended annual dental exams for proper oral hygiene  Community Resource Referral:  CRR required this visit?  No   CCM referral sent, patient concerned with her weight loss and is requesting assistance in gaining weight back. She states they are giving her some protein at dialysis as well and she will have them fax over her medical records indicating how much. She states she eats good during the day but just cant gain anything back.       Plan:  I have personally reviewed and addressed the Medicare Annual Wellness questionnaire and have noted the following in the patient's chart:  A. Medical and social history B. Use of alcohol, tobacco or illicit drugs  C. Current medications and supplements D. Functional ability and status E.  Nutritional status F.  Physical activity G. Advance directives H. List of other physicians I.  Hospitalizations, surgeries, and ER visits in previous 12 months J.  [place] such as hearing and vision if needed, cognitive and depression L. Referrals and appointments   In addition, I have reviewed and discussed with patient certain preventive protocols, quality metrics, and best practice recommendations. A written personalized care plan for preventive services as well as general preventive health recommendations were provided to patient. Nurse Health Advisor  Signed,    [place], Caro Hight, [place]  08/30/1832 Nurse Health Advisor   Nurse Notes: none

## 2018-09-10 NOTE — Patient Instructions (Signed)
Marissa Hess , Thank you for taking time to come for your Medicare Wellness Visit. I appreciate your ongoing commitment to your health goals. Please review the following plan we discussed and let me know if I can assist you in the future.   Screening recommendations/referrals: Colonoscopy: no longer required Mammogram: no longer required Bone Density: completed Recommended yearly ophthalmology/optometry visit for glaucoma screening and checkup Recommended yearly dental visit for hygiene and checkup  Vaccinations: Influenza vaccine: up to date Pneumococcal vaccine: up to date Tdap vaccine: up to date Shingles vaccine: shingrix eligible, check with your insurance company for coverage    Advanced directives: please let us know if you have any questions or need assistance completing this.   Conditions/risks identified: on dialysis- concerned with not gaining any weight back. Referral placed with our chronic care management team. Someone will reach out to you from the team.  Next appointment: follow up in one year for your annual wellness exam.    Preventive Care 65 Years and Older, Female Preventive care refers to lifestyle choices and visits with your health care provider that can promote health and wellness. What does preventive care include?  A yearly physical exam. This is also called an annual well check.  Dental exams once or twice a year.  Routine eye exams. Ask your health care provider how often you should have your eyes checked.  Personal lifestyle choices, including:  Daily care of your teeth and gums.  Regular physical activity.  Eating a healthy diet.  Avoiding tobacco and drug use.  Limiting alcohol use.  Practicing safe sex.  Taking low-dose aspirin every day.  Taking vitamin and mineral supplements as recommended by your health care provider. What happens during an annual well check? The services and screenings done by your health care provider during  your annual well check will depend on your age, overall health, lifestyle risk factors, and family history of disease. Counseling  Your health care provider may ask you questions about your:  Alcohol use.  Tobacco use.  Drug use.  Emotional well-being.  Home and relationship well-being.  Sexual activity.  Eating habits.  History of falls.  Memory and ability to understand (cognition).  Work and work Statistician.  Reproductive health. Screening  You may have the following tests or measurements:  Height, weight, and BMI.  Blood pressure.  Lipid and cholesterol levels. These may be checked every 5 years, or more frequently if you are over 87 years old.  Skin check.  Lung cancer screening. You may have this screening every year starting at age 28 if you have a 30-pack-year history of smoking and currently smoke or have quit within the past 15 years.  Fecal occult blood test (FOBT) of the stool. You may have this test every year starting at age 4.  Flexible sigmoidoscopy or colonoscopy. You may have a sigmoidoscopy every 5 years or a colonoscopy every 10 years starting at age 49.  Hepatitis C blood test.  Hepatitis B blood test.  Sexually transmitted disease (STD) testing.  Diabetes screening. This is done by checking your blood sugar (glucose) after you have not eaten for a while (fasting). You may have this done every 1-3 years.  Bone density scan. This is done to screen for osteoporosis. You may have this done starting at age 29.  Mammogram. This may be done every 1-2 years. Talk to your health care provider about how often you should have regular mammograms. Talk with your health care provider about your  test results, treatment options, and if necessary, the need for more tests. Vaccines  Your health care provider may recommend certain vaccines, such as:  Influenza vaccine. This is recommended every year.  Tetanus, diphtheria, and acellular pertussis (Tdap,  Td) vaccine. You may need a Td booster every 10 years.  Zoster vaccine. You may need this after age 74.  Pneumococcal 13-valent conjugate (PCV13) vaccine. One dose is recommended after age 53.  Pneumococcal polysaccharide (PPSV23) vaccine. One dose is recommended after age 14. Talk to your health care provider about which screenings and vaccines you need and how often you need them. This information is not intended to replace advice given to you by your health care provider. Make sure you discuss any questions you have with your health care provider. Document Released: 03/31/2015 Document Revised: 11/22/2015 Document Reviewed: 01/03/2015 Elsevier Interactive Patient Education  2017 [employer] Prevention in the Home Falls can cause injuries. They can happen to people of all ages. There are many things you can do to make your home safe and to help prevent falls. What can I do on the outside of my home?  Regularly fix the edges of walkways and driveways and fix any cracks.  Remove anything that might make you trip as you walk through a door, such as a raised step or threshold.  Trim any bushes or trees on the path to your home.  Use bright outdoor lighting.  Clear any walking paths of anything that might make someone trip, such as rocks or tools.  Regularly check to see if handrails are loose or broken. Make sure that both sides of any steps have handrails.  Any raised decks and porches should have guardrails on the edges.  Have any leaves, snow, or ice cleared regularly.  Use sand or salt on walking paths during winter.  Clean up any spills in your garage right away. This includes oil or grease spills. What can I do in the bathroom?  Use night lights.  Install grab bars by the toilet and in the tub and shower. Do not use towel bars as grab bars.  Use non-skid mats or decals in the tub or shower.  If you need to sit down in the shower, use a plastic, non-slip stool.   Keep the floor dry. Clean up any water that spills on the floor as soon as it happens.  Remove soap buildup in the tub or shower regularly.  Attach bath mats securely with double-sided non-slip rug tape.  Do not have throw rugs and other things on the floor that can make you trip. What can I do in the bedroom?  Use night lights.  Make sure that you have a light by your bed that is easy to reach.  Do not use any sheets or blankets that are too big for your bed. They should not hang down onto the floor.  Have a firm chair that has side arms. You can use this for support while you get dressed.  Do not have throw rugs and other things on the floor that can make you trip. What can I do in the kitchen?  Clean up any spills right away.  Avoid walking on wet floors.  Keep items that you use a lot in easy-to-reach places.  If you need to reach something above you, use a strong step stool that has a grab bar.  Keep electrical cords out of the way.  Do not use floor polish or wax that  makes floors slippery. If you must use wax, use non-skid floor wax.  Do not have throw rugs and other things on the floor that can make you trip. What can I do with my stairs?  Do not leave any items on the stairs.  Make sure that there are handrails on both sides of the stairs and use them. Fix handrails that are broken or loose. Make sure that handrails are as long as the stairways.  Check any carpeting to make sure that it is firmly attached to the stairs. Fix any carpet that is loose or worn.  Avoid having throw rugs at the top or bottom of the stairs. If you do have throw rugs, attach them to the floor with carpet tape.  Make sure that you have a light switch at the top of the stairs and the bottom of the stairs. If you do not have them, ask someone to add them for you. What else can I do to help prevent falls?  Wear shoes that:  Do not have high heels.  Have rubber bottoms.  Are  comfortable and fit you well.  Are closed at the toe. Do not wear sandals.  If you use a stepladder:  Make sure that it is fully opened. Do not climb a closed stepladder.  Make sure that both sides of the stepladder are locked into place.  Ask someone to hold it for you, if possible.  Clearly mark and make sure that you can see:  Any grab bars or handrails.  First and last steps.  Where the edge of each step is.  Use tools that help you move around (mobility aids) if they are needed. These include:  Canes.  Walkers.  Scooters.  Crutches.  Turn on the lights when you go into a dark area. Replace any light bulbs as soon as they burn out.  Set up your furniture so you have a clear path. Avoid moving your furniture around.  If any of your floors are uneven, fix them.  If there are any pets around you, be aware of where they are.  Review your medicines with your doctor. Some medicines can make you feel dizzy. This can increase your chance of falling. Ask your doctor what other things that you can do to help prevent falls. This information is not intended to replace advice given to you by your health care provider. Make sure you discuss any questions you have with your health care provider. Document Released: 12/29/2008 Document Revised: 08/10/2015 Document Reviewed: 04/08/2014 Elsevier Interactive Patient Education  2017 [employer].

## 2018-09-11 DIAGNOSIS — Z992 Dependence on renal dialysis: Secondary | ICD-10-CM | POA: Diagnosis not present

## 2018-09-11 DIAGNOSIS — N186 End stage renal disease: Secondary | ICD-10-CM | POA: Diagnosis not present

## 2018-09-14 DIAGNOSIS — Z992 Dependence on renal dialysis: Secondary | ICD-10-CM | POA: Diagnosis not present

## 2018-09-14 DIAGNOSIS — N186 End stage renal disease: Secondary | ICD-10-CM | POA: Diagnosis not present

## 2018-09-15 DIAGNOSIS — N186 End stage renal disease: Secondary | ICD-10-CM | POA: Diagnosis not present

## 2018-09-15 DIAGNOSIS — Z992 Dependence on renal dialysis: Secondary | ICD-10-CM | POA: Diagnosis not present

## 2018-09-16 DIAGNOSIS — Z992 Dependence on renal dialysis: Secondary | ICD-10-CM | POA: Diagnosis not present

## 2018-09-16 DIAGNOSIS — N186 End stage renal disease: Secondary | ICD-10-CM | POA: Diagnosis not present

## 2018-09-16 DIAGNOSIS — T82898A Other specified complication of vascular prosthetic devices, implants and grafts, initial encounter: Secondary | ICD-10-CM | POA: Diagnosis not present

## 2018-09-18 DIAGNOSIS — N186 End stage renal disease: Secondary | ICD-10-CM | POA: Diagnosis not present

## 2018-09-18 DIAGNOSIS — T82898A Other specified complication of vascular prosthetic devices, implants and grafts, initial encounter: Secondary | ICD-10-CM | POA: Diagnosis not present

## 2018-09-18 DIAGNOSIS — Z992 Dependence on renal dialysis: Secondary | ICD-10-CM | POA: Diagnosis not present

## 2018-09-23 DIAGNOSIS — T82898A Other specified complication of vascular prosthetic devices, implants and grafts, initial encounter: Secondary | ICD-10-CM | POA: Diagnosis not present

## 2018-09-23 DIAGNOSIS — N186 End stage renal disease: Secondary | ICD-10-CM | POA: Diagnosis not present

## 2018-09-23 DIAGNOSIS — Z992 Dependence on renal dialysis: Secondary | ICD-10-CM | POA: Diagnosis not present

## 2018-09-25 ENCOUNTER — Telehealth: Payer: Self-pay | Admitting: Family Medicine

## 2018-09-25 DIAGNOSIS — Z992 Dependence on renal dialysis: Secondary | ICD-10-CM | POA: Diagnosis not present

## 2018-09-25 DIAGNOSIS — N186 End stage renal disease: Secondary | ICD-10-CM | POA: Diagnosis not present

## 2018-09-25 DIAGNOSIS — T82898A Other specified complication of vascular prosthetic devices, implants and grafts, initial encounter: Secondary | ICD-10-CM | POA: Diagnosis not present

## 2018-09-25 NOTE — Telephone Encounter (Signed)
Patient calling to report that yesterday, 7/9, she began feeling "unbalanced". Patient denied fall or injury. Patient also notes that on Wednesday after her dialysis appointment she noticed 3 "mass buildups" located in her chest, her vaginal region and her buttocks. When questioned about them, patient states they are like firm balls under skin. Patient noted that the only one that is painful is located on her buttock. Patient was advised by dialysis nurse to contact office for advice/instruction.

## 2018-09-25 NOTE — Telephone Encounter (Signed)
Called pt to schedule appt, no answer left VM to call us back to schedule

## 2018-09-25 NOTE — Telephone Encounter (Signed)
Can we see if we can get her in to look at the "build-ups"

## 2018-09-25 NOTE — Telephone Encounter (Signed)
Noted  

## 2018-09-25 NOTE — Telephone Encounter (Signed)
Scheduled pt for Tuesday 09/29/2018 with Henrine Screws

## 2018-09-28 DIAGNOSIS — Z992 Dependence on renal dialysis: Secondary | ICD-10-CM | POA: Diagnosis not present

## 2018-09-28 DIAGNOSIS — T82898A Other specified complication of vascular prosthetic devices, implants and grafts, initial encounter: Secondary | ICD-10-CM | POA: Diagnosis not present

## 2018-09-28 DIAGNOSIS — N186 End stage renal disease: Secondary | ICD-10-CM | POA: Diagnosis not present

## 2018-09-29 ENCOUNTER — Ambulatory Visit (INDEPENDENT_AMBULATORY_CARE_PROVIDER_SITE_OTHER): Payer: Medicare Other | Admitting: Nurse Practitioner

## 2018-09-29 ENCOUNTER — Other Ambulatory Visit: Payer: Self-pay

## 2018-09-29 ENCOUNTER — Encounter: Payer: Self-pay | Admitting: Nurse Practitioner

## 2018-09-29 DIAGNOSIS — H6982 Other specified disorders of Eustachian tube, left ear: Secondary | ICD-10-CM | POA: Diagnosis not present

## 2018-09-29 DIAGNOSIS — R229 Localized swelling, mass and lump, unspecified: Secondary | ICD-10-CM

## 2018-09-29 DIAGNOSIS — H6992 Unspecified Eustachian tube disorder, left ear: Secondary | ICD-10-CM | POA: Insufficient documentation

## 2018-09-29 DIAGNOSIS — IMO0002 Reserved for concepts with insufficient information to code with codable children: Secondary | ICD-10-CM | POA: Insufficient documentation

## 2018-09-29 MED ORDER — PREDNISONE 10 MG PO TABS: 30.0000 mg | ORAL_TABLET | Freq: Every day | ORAL | 0 refills | Status: AC

## 2018-09-29 NOTE — Patient Instructions (Signed)
Lipoma  A lipoma is a noncancerous (benign) tumor that is made up of fat cells. This is a very common type of soft-tissue growth. Lipomas are usually found under the skin (subcutaneous). They may occur in any tissue of the body that contains fat. Common areas for lipomas to appear include the back, shoulders, buttocks, and thighs.  Lipomas grow slowly, and they are usually painless. Most lipomas do not cause problems and do not require treatment. What are the causes? The cause of this condition is not known. What increases the risk? You are more likely to develop this condition if:  You are 82-47 years old.  You have a family history of lipomas. What are the signs or symptoms? A lipoma usually appears as a small, round bump under the skin. In most cases, the lump will:  Feel soft or rubbery.  Not cause pain or other symptoms. However, if a lipoma is located in an area where it pushes on nerves, it can become painful or cause other symptoms. How is this diagnosed? A lipoma can usually be diagnosed with a physical exam. You may also have tests to confirm the diagnosis and to rule out other conditions. Tests may include:  Imaging tests, such as a CT scan or MRI.  Removal of a tissue sample to be looked at under a microscope (biopsy). How is this treated? Treatment for this condition depends on the size of the lipoma and whether it is causing any symptoms.  For small lipomas that are not causing problems, no treatment is needed.  If a lipoma is bigger or it causes problems, surgery may be done to remove the lipoma. Lipomas can also be removed to improve appearance. Most often, the procedure is done after applying a medicine that numbs the area (local anesthetic). Follow these instructions at home:  Watch your lipoma for any changes.  Keep all follow-up visits as told by your health care provider. This is important. Contact a health care provider if:  Your lipoma becomes larger or  hard.  Your lipoma becomes painful, red, or increasingly swollen. These could be signs of infection or a more serious condition. Get help right away if:  You develop tingling or numbness in an area near the lipoma. This could indicate that the lipoma is causing nerve damage. Summary  A lipoma is a noncancerous tumor that is made up of fat cells.  Most lipomas do not cause problems and do not require treatment.  If a lipoma is bigger or it causes problems, surgery may be done to remove the lipoma. This information is not intended to replace advice given to you by your health care provider. Make sure you discuss any questions you have with your health care provider. Document Released: 02/22/2002 Document Revised: 02/18/2017 Document Reviewed: 02/18/2017 Elsevier Patient Education  [place].

## 2018-09-29 NOTE — Assessment & Plan Note (Signed)
Present to lower right buttock, suspect lipoma.  She refuses referral to general surgery at this time, which is acceptable due to and no s/s infection or discomfort.  She wishes to continue to monitor and if any pain or increase in size will return to office.

## 2018-09-29 NOTE — Assessment & Plan Note (Signed)
Acute to left ear.  Will send in Prednisone 30 MG x 5 days and if worsening or no improvement patient aware to return to office.  Avoid use of Qtips.

## 2018-09-29 NOTE — Progress Notes (Signed)
BP 136/67   Pulse 81   Temp 98.4 F (36.9 C) (Oral)   SpO2 98%    Subjective:    Patient ID: Marissa Hess, female    DOB: April 20, 1934, 84 y.o.   MRN: [id number]  HPI: Marissa Hess is a 84 y.o. female  Chief Complaint  Patient presents with  . Cyst    Knots on buttocks (makes it difficult to lay at night) and one on vagina   CYST TO BUTTOCKS AND VAGINA: Started with cyst started on right buttock Wednesday and one to vagina started two months ago. Has not had similar before.  Denies pain to areas, except for when she sits down has pain to one on buttocks.  States they have not gotten bigger, the one on vagina "goes up and down".  Denies drainage, warmth, or redness.  At home has applied heat to areas, but this has not improved them.  Seh states there are no other areas to body similar.  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  Does endorse losing lots of weight over past year and feeling "things I have not before".  EAR PAIN Started about one week ago.  States it sounds like water in ear.  Has had similar issue before and used Prednisone without issue. Duration: weeks Involved ear(s): left Severity:  5/10  Quality:  dull and aching Fever: no Otorrhea: no Upper respiratory infection symptoms: no Pruritus: yes Hearing loss: no Water immersion no Using Q-tips: yes Recurrent otitis media: no Status: fluctuating Treatments attempted: sweet oil  Relevant past medical, surgical, family and social history reviewed and updated as indicated. Interim medical history since our last visit reviewed. Allergies and medications reviewed and updated.  Review of Systems  Constitutional: Negative for activity change, appetite change, diaphoresis, fatigue and fever.  HENT: Positive for ear pain. Negative for ear discharge.   Respiratory: Negative for cough, chest tightness and shortness of breath.   Cardiovascular: Negative for chest pain, palpitations and leg swelling.   Gastrointestinal: Negative for abdominal distention, abdominal pain, constipation, diarrhea, nausea and vomiting.  Neurological: Negative for dizziness, syncope, weakness, light-headedness, numbness and headaches.  Psychiatric/Behavioral: Negative.     Per HPI unless specifically indicated above     Objective:    BP 136/67   Pulse 81   Temp 98.4 F (36.9 C) (Oral)   SpO2 98%   Wt Readings from Last 3 Encounters:  09/10/18 120 lb (54.4 kg)  07/31/18 121 lb 9.6 oz (55.2 kg)  06/09/18 125 lb (56.7 kg)    Physical Exam Vitals signs and nursing note reviewed.  Constitutional:      General: She is awake. She is not in acute distress.    Appearance: She is well-developed. She is not ill-appearing.  HENT:     Head: Normocephalic.     Right Ear: Hearing, tympanic membrane, ear canal and external ear normal.     Left Ear: Hearing, ear canal and external ear normal. A middle ear effusion is present. Tympanic membrane is not injected.     Nose: Nose normal.     Mouth/Throat:     Mouth: Mucous membranes are moist.  Eyes:     General: Lids are normal.        Right eye: No discharge.        Left eye: No discharge.     Conjunctiva/sclera: Conjunctivae normal.     Pupils: Pupils are equal, round, and reactive to light.  Neck:  Musculoskeletal: Normal range of motion and neck supple.     Thyroid: No thyromegaly.     Vascular: No carotid bruit or JVD.  Cardiovascular:     Rate and Rhythm: Normal rate and regular rhythm.     Heart sounds: Normal heart sounds. No murmur. No gallop.   Pulmonary:     Effort: Pulmonary effort is normal. No accessory muscle usage or respiratory distress.     Breath sounds: Normal breath sounds.  Abdominal:     General: Bowel sounds are normal.     Palpations: Abdomen is soft. There is no hepatomegaly or splenomegaly.  Musculoskeletal:     Right lower leg: No edema.     Left lower leg: No edema.  Lymphadenopathy:     Head:     Right side of head: No  submental, submandibular, tonsillar, preauricular or posterior auricular adenopathy.     Left side of head: No submental, submandibular, tonsillar, preauricular or posterior auricular adenopathy.     Cervical: No cervical adenopathy.  Skin:    General: Skin is warm and dry.          Comments: Area to suprapubic aspect is tendon and bone on palpation over area patient concerned about, educated her on this.  No masses felt in suprapubic aspect.  Neurological:     Mental Status: She is alert and oriented to person, place, and time.  Psychiatric:        Attention and Perception: Attention normal.        Mood and Affect: Mood normal.        Behavior: Behavior normal. Behavior is cooperative.        Thought Content: Thought content normal.        Judgment: Judgment normal.     Results for orders placed or performed in visit on 01/26/18  Comprehensive metabolic panel  Result Value Ref Range   Sodium 136 135 - 145 mmol/L   Potassium 4.2 3.5 - 5.1 mmol/L   Chloride 103 98 - 111 mmol/L   CO2 22 22 - 32 mmol/L   Glucose, Bld 84 70 - 99 mg/dL   BUN 53 (H) 8 - 23 mg/dL   Creatinine, Ser 5.05 (H) 0.44 - 1.00 mg/dL   Calcium 10.4 (H) 8.9 - 10.3 mg/dL   Total Protein 8.1 6.5 - 8.1 g/dL   Albumin 3.3 (L) 3.5 - 5.0 g/dL   AST 16 15 - 41 U/L   ALT 9 0 - 44 U/L   Alkaline Phosphatase 33 (L) 38 - 126 U/L   Total Bilirubin 0.3 0.3 - 1.2 mg/dL   GFR calc non Af Amer 7 (L) >60 mL/min   GFR calc Af Amer 8 (L) >60 mL/min   Anion gap 11 5 - 15  CBC with Differential/Platelet  Result Value Ref Range   WBC 6.4 4.0 - 10.5 K/uL   RBC 3.33 (L) 3.87 - 5.11 MIL/uL   Hemoglobin 8.9 (L) 12.0 - 15.0 g/dL   HCT 28.9 (L) 36.0 - 46.0 %   MCV 86.8 80.0 - 100.0 fL   MCH 26.7 26.0 - 34.0 pg   MCHC 30.8 30.0 - 36.0 g/dL   RDW 17.0 (H) 11.5 - 15.5 %   Platelets 250 150 - 400 K/uL   nRBC 0.0 0.0 - 0.2 %   Neutrophils Relative % 53 %   Neutro Abs 3.5 1.7 - 7.7 K/uL   Lymphocytes Relative 31 %   Lymphs Abs 2.0  0.7 - 4.0 K/uL  Monocytes Relative 11 %   Monocytes Absolute 0.7 0.1 - 1.0 K/uL   Eosinophils Relative 3 %   Eosinophils Absolute 0.2 0.0 - 0.5 K/uL   Basophils Relative 1 %   Basophils Absolute 0.0 0.0 - 0.1 K/uL   Immature Granulocytes 1 %   Abs Immature Granulocytes 0.04 0.00 - 0.07 K/uL  Iron and TIBC  Result Value Ref Range   Iron 49 28 - 170 ug/dL   TIBC 178 (L) 250 - 450 ug/dL   Saturation Ratios 28 10.4 - 31.8 %   UIBC 129 ug/dL  Ferritin  Result Value Ref Range   Ferritin 362 (H) 11 - 307 ng/mL      Assessment & Plan:   Problem List Items Addressed This Visit      Nervous and Auditory   Eustachian tube dysfunction, left    Acute to left ear.  Will send in Prednisone 30 MG x 5 days and if worsening or no improvement patient aware to return to office.  Avoid use of Qtips.        Other   Mass    Present to lower right buttock, suspect lipoma.  She refuses referral to general surgery at this time, which is acceptable due to and no s/s infection or discomfort.  She wishes to continue to monitor and if any pain or increase in size will return to office.          Follow up plan: Return if symptoms worsen or fail to improve.

## 2018-09-30 DIAGNOSIS — Z992 Dependence on renal dialysis: Secondary | ICD-10-CM | POA: Diagnosis not present

## 2018-09-30 DIAGNOSIS — N186 End stage renal disease: Secondary | ICD-10-CM | POA: Diagnosis not present

## 2018-09-30 DIAGNOSIS — T82898A Other specified complication of vascular prosthetic devices, implants and grafts, initial encounter: Secondary | ICD-10-CM | POA: Diagnosis not present

## 2018-10-01 DIAGNOSIS — Z992 Dependence on renal dialysis: Secondary | ICD-10-CM | POA: Diagnosis not present

## 2018-10-01 DIAGNOSIS — N186 End stage renal disease: Secondary | ICD-10-CM | POA: Diagnosis not present

## 2018-10-01 DIAGNOSIS — T82898A Other specified complication of vascular prosthetic devices, implants and grafts, initial encounter: Secondary | ICD-10-CM | POA: Diagnosis not present

## 2018-10-05 DIAGNOSIS — Z992 Dependence on renal dialysis: Secondary | ICD-10-CM | POA: Diagnosis not present

## 2018-10-05 DIAGNOSIS — N186 End stage renal disease: Secondary | ICD-10-CM | POA: Diagnosis not present

## 2018-10-05 DIAGNOSIS — T82898A Other specified complication of vascular prosthetic devices, implants and grafts, initial encounter: Secondary | ICD-10-CM | POA: Diagnosis not present

## 2018-10-06 ENCOUNTER — Telehealth: Payer: Self-pay | Admitting: Nurse Practitioner

## 2018-10-06 NOTE — Telephone Encounter (Signed)
Copied from [facility] [phone] [id number]. Topic: General - Other >> Oct 06, 2018  9:57 AM Keene Breath wrote: Reason for CRM: Patient called to ask that the nurse call her back because she is still having ear problems after the medication she has been taking.  Patient stated that the doctor wanted to know if the medication had helped.  Please advise and call patient back at [phone]

## 2018-10-06 NOTE — Telephone Encounter (Signed)
Appointment scheduled.

## 2018-10-07 DIAGNOSIS — T82898A Other specified complication of vascular prosthetic devices, implants and grafts, initial encounter: Secondary | ICD-10-CM | POA: Diagnosis not present

## 2018-10-07 DIAGNOSIS — N2581 Secondary hyperparathyroidism of renal origin: Secondary | ICD-10-CM | POA: Diagnosis not present

## 2018-10-07 DIAGNOSIS — Z992 Dependence on renal dialysis: Secondary | ICD-10-CM | POA: Diagnosis not present

## 2018-10-07 DIAGNOSIS — D509 Iron deficiency anemia, unspecified: Secondary | ICD-10-CM | POA: Diagnosis not present

## 2018-10-07 DIAGNOSIS — N186 End stage renal disease: Secondary | ICD-10-CM | POA: Diagnosis not present

## 2018-10-08 ENCOUNTER — Ambulatory Visit (INDEPENDENT_AMBULATORY_CARE_PROVIDER_SITE_OTHER): Payer: Medicare Other | Admitting: Family Medicine

## 2018-10-08 ENCOUNTER — Encounter: Payer: Self-pay | Admitting: Family Medicine

## 2018-10-08 ENCOUNTER — Other Ambulatory Visit: Payer: Self-pay

## 2018-10-08 VITALS — BP 113/73 | HR 107 | Temp 98.2°F | Ht 63.0 in | Wt 122.0 lb

## 2018-10-08 DIAGNOSIS — H6982 Other specified disorders of Eustachian tube, left ear: Secondary | ICD-10-CM | POA: Diagnosis not present

## 2018-10-08 MED ORDER — AZELASTINE HCL 0.1 % NA SOLN: 1.0000 | Freq: Two times a day (BID) | NASAL | 1 refills | Status: DC

## 2018-10-08 MED ORDER — PREDNISONE 20 MG PO TABS: 40.0000 mg | ORAL_TABLET | Freq: Every day | ORAL | 0 refills | Status: DC

## 2018-10-08 NOTE — Assessment & Plan Note (Signed)
Will try one more round of prednisone and continue antihistamine and flonase. Add astelin nasal spray. F/u if not improving

## 2018-10-08 NOTE — Progress Notes (Signed)
BP 113/73 (BP Location: Left Arm, Patient Position: Sitting, Cuff Size: Normal)   Pulse (!) 107   Temp 98.2 F (36.8 C) (Oral)   Ht 5\' 3"  (1.6 m)   Wt 122 lb (55.3 kg)   SpO2 98%   BMI 21.61 kg/m    Subjective:    Patient ID: Marissa Hess, female    DOB: 14-Jan-1935, 84 y.o.   MRN: [id number]  HPI: Marissa Hess is a 84 y.o. female  Chief Complaint  Patient presents with  . Ear Pain    Patient still having left ear pain. Patient states the prednisone seemed to help, but never went away.    Left ear pressure, popping ongoing for several weeks. Has been using some sweet oil on it and completed a round of prednisone from another provider last week. This helped temporarily. Compliant with allergy regimen of zyrtec and flonase. No muffled hearing, sharp pain, fevers, chills, congestion, sick contacts.   Relevant past medical, surgical, family and social history reviewed and updated as indicated. Interim medical history since our last visit reviewed. Allergies and medications reviewed and updated.  Review of Systems  Per HPI unless specifically indicated above     Objective:    BP 113/73 (BP Location: Left Arm, Patient Position: Sitting, Cuff Size: Normal)   Pulse (!) 107   Temp 98.2 F (36.8 C) (Oral)   Ht 5\' 3"  (1.6 m)   Wt 122 lb (55.3 kg)   SpO2 98%   BMI 21.61 kg/m   Wt Readings from Last 3 Encounters:  10/08/18 122 lb (55.3 kg)  09/10/18 120 lb (54.4 kg)  07/31/18 121 lb 9.6 oz (55.2 kg)    Physical Exam Vitals signs and nursing note reviewed.  Constitutional:      Appearance: Normal appearance. She is not ill-appearing.  HENT:     Head: Atraumatic.     Right Ear: External ear normal.     Left Ear: External ear normal.     Ears:     Comments: Right middle ear benign, left middle ear effusion present Eyes:     Extraocular Movements: Extraocular movements intact.     Conjunctiva/sclera: Conjunctivae normal.  Neck:     Musculoskeletal: Normal range of  motion and neck supple.  Cardiovascular:     Rate and Rhythm: Normal rate and regular rhythm.     Heart sounds: Normal heart sounds.  Pulmonary:     Effort: Pulmonary effort is normal.     Breath sounds: Normal breath sounds.  Musculoskeletal: Normal range of motion.  Skin:    General: Skin is warm and dry.  Neurological:     Mental Status: She is alert and oriented to person, place, and time.  Psychiatric:        Mood and Affect: Mood normal.        Thought Content: Thought content normal.        Judgment: Judgment normal.     Results for orders placed or performed in visit on 01/26/18  Comprehensive metabolic panel  Result Value Ref Range   Sodium 136 135 - 145 mmol/L   Potassium 4.2 3.5 - 5.1 mmol/L   Chloride 103 98 - 111 mmol/L   CO2 22 22 - 32 mmol/L   Glucose, Bld 84 70 - 99 mg/dL   BUN 53 (H) 8 - 23 mg/dL   Creatinine, Ser 5.05 (H) 0.44 - 1.00 mg/dL   Calcium 10.4 (H) 8.9 - 10.3 mg/dL   Total  Protein 8.1 6.5 - 8.1 g/dL   Albumin 3.3 (L) 3.5 - 5.0 g/dL   AST 16 15 - 41 U/L   ALT 9 0 - 44 U/L   Alkaline Phosphatase 33 (L) 38 - 126 U/L   Total Bilirubin 0.3 0.3 - 1.2 mg/dL   GFR calc non Af Amer 7 (L) >60 mL/min   GFR calc Af Amer 8 (L) >60 mL/min   Anion gap 11 5 - 15  CBC with Differential/Platelet  Result Value Ref Range   WBC 6.4 4.0 - 10.5 K/uL   RBC 3.33 (L) 3.87 - 5.11 MIL/uL   Hemoglobin 8.9 (L) 12.0 - 15.0 g/dL   HCT 28.9 (L) 36.0 - 46.0 %   MCV 86.8 80.0 - 100.0 fL   MCH 26.7 26.0 - 34.0 pg   MCHC 30.8 30.0 - 36.0 g/dL   RDW 17.0 (H) 11.5 - 15.5 %   Platelets 250 150 - 400 K/uL   nRBC 0.0 0.0 - 0.2 %   Neutrophils Relative % 53 %   Neutro Abs 3.5 1.7 - 7.7 K/uL   Lymphocytes Relative 31 %   Lymphs Abs 2.0 0.7 - 4.0 K/uL   Monocytes Relative 11 %   Monocytes Absolute 0.7 0.1 - 1.0 K/uL   Eosinophils Relative 3 %   Eosinophils Absolute 0.2 0.0 - 0.5 K/uL   Basophils Relative 1 %   Basophils Absolute 0.0 0.0 - 0.1 K/uL   Immature Granulocytes 1  %   Abs Immature Granulocytes 0.04 0.00 - 0.07 K/uL  Iron and TIBC  Result Value Ref Range   Iron 49 28 - 170 ug/dL   TIBC 178 (L) 250 - 450 ug/dL   Saturation Ratios 28 10.4 - 31.8 %   UIBC 129 ug/dL  Ferritin  Result Value Ref Range   Ferritin 362 (H) 11 - 307 ng/mL      Assessment & Plan:   Problem List Items Addressed This Visit      Nervous and Auditory   Eustachian tube dysfunction, left - Primary    Will try one more round of prednisone and continue antihistamine and flonase. Add astelin nasal spray. F/u if not improving          Follow up plan: Return if symptoms worsen or fail to improve.

## 2018-10-09 DIAGNOSIS — T82898A Other specified complication of vascular prosthetic devices, implants and grafts, initial encounter: Secondary | ICD-10-CM | POA: Diagnosis not present

## 2018-10-09 DIAGNOSIS — Z992 Dependence on renal dialysis: Secondary | ICD-10-CM | POA: Diagnosis not present

## 2018-10-09 DIAGNOSIS — N186 End stage renal disease: Secondary | ICD-10-CM | POA: Diagnosis not present

## 2018-10-12 DIAGNOSIS — Z992 Dependence on renal dialysis: Secondary | ICD-10-CM | POA: Diagnosis not present

## 2018-10-12 DIAGNOSIS — N186 End stage renal disease: Secondary | ICD-10-CM | POA: Diagnosis not present

## 2018-10-12 DIAGNOSIS — T82898A Other specified complication of vascular prosthetic devices, implants and grafts, initial encounter: Secondary | ICD-10-CM | POA: Diagnosis not present

## 2018-10-13 ENCOUNTER — Ambulatory Visit (INDEPENDENT_AMBULATORY_CARE_PROVIDER_SITE_OTHER): Payer: Medicare Other | Admitting: *Deleted

## 2018-10-13 ENCOUNTER — Telehealth: Payer: Self-pay

## 2018-10-13 DIAGNOSIS — R634 Abnormal weight loss: Secondary | ICD-10-CM

## 2018-10-13 DIAGNOSIS — N186 End stage renal disease: Secondary | ICD-10-CM

## 2018-10-13 NOTE — Patient Instructions (Addendum)
Thank you allowing the Chronic Care Management Team to be a part of your care! It was a pleasure speaking with you today!   CCM (Chronic Care Management) Team   Kimorah Ridolfi RN, BSN Nurse Care Coordinator  [phone]  [facility] PharmD  Clinical Pharmacist  [phone]  Eula Fried LCSW Clinical Social Worker [phone]  Goals Addressed            This Visit's Progress   . I am worried about how much weight I have lost (pt-stated)       Current Barriers:  Marland Kitchen Knowledge Deficits related to recent weight loss  Nurse Case Manager Clinical Goal(s):  Marland Kitchen Over the next 90 days, patient will work with [facility] to address needs related to reported sudden weight loss  Interventions:  . Advised patient to add calorie dense foods to diet for example Carnation onstant breakfast . Reviewed medications with patient and discussed recent ear pain which patient stated was resolving with newly added medication . Discussed plans with patient for ongoing care management follow up and provided patient with direct contact information for care management team . Provided patient with mailed written educational materials related to weight gain . Pharmacy referral for thorough medication review   Patient Self Care Activities:  . Currently UNABLE TO independently identify reason for unintentional weight loss . Calls provider office for new concerns or questions  Initial goal documentation        The patient verbalized understanding of instructions provided today and declined a print copy of patient instruction materials.   The care management team will reach out to the patient again over the next 30 days.  The patient has been provided with contact information for the care management team and has been advised to call with any health related questions or concerns.    High-Protein and High-Calorie Diet Eating high-protein and high-calorie foods can help you to gain weight, heal after an  injury, and recover after an illness or surgery. The specific amount of daily protein and calories you need depends on:  Your body weight.  The reason this diet is recommended for you. What is my plan? Generally, a high-protein, high-calorie diet involves:  Eating 250-500 extra calories each day.  Making sure that you get enough of your daily calories from protein. Ask your health care provider how many of your calories should come from protein. Talk with a health care provider, such as a diet and nutrition specialist (dietitian), about how much protein and how many calories you need each day. Follow the diet as directed by your health care provider. What are tips for following this plan?  Preparing meals  Add whole milk, half-and-half, or heavy cream to cereal, pudding, soup, or hot cocoa.  Add whole milk to instant breakfast drinks.  Add peanut butter to oatmeal or smoothies.  Add powdered milk to baked goods, smoothies, or milkshakes.  Add powdered milk, cream, or butter to mashed potatoes.  Add cheese to cooked vegetables.  Make whole-milk yogurt parfaits. Top them with granola, fruit, or nuts.  Add cottage cheese to your fruit.  Add avocado, cheese, or both to sandwiches or salads.  Add meat, poultry, or seafood to rice, pasta, casseroles, salads, and soups.  Use mayonnaise when making egg salad, chicken salad, or tuna salad.  Use peanut butter as a dip for vegetables or as a topping for pretzels, celery, or crackers.  Add beans to casseroles, dips, and spreads.  Add pureed beans to  sauces and soups.  Replace calorie-free drinks with calorie-containing drinks, such as milk and fruit juice.  Replace water with milk or heavy cream when making foods such as oatmeal, pudding, or cocoa. General instructions  Ask your health care provider if you should take a nutritional supplement.  Try to eat six small meals each day instead of three large meals.  Eat a balanced  diet. In each meal, include one food that is high in protein.  Keep nutritious snacks available, such as nuts, trail mixes, dried fruit, and yogurt.  If you have kidney disease or diabetes, talk with your health care provider about how much protein is safe for you. Too much protein may put extra stress on your kidneys.  Drink your calories. Choose high-calorie drinks and have them after your meals. What high-protein foods should I eat?  Vegetables Soybeans. Peas. Grains Quinoa. Bulgur wheat. Meats and other proteins Beef, pork, and poultry. Fish and seafood. Eggs. Tofu. Textured vegetable protein (TVP). Peanut butter. Nuts and seeds. Dried beans. Protein powders. Dairy Whole milk. Whole-milk yogurt. Powdered milk. Cheese. [employer]. Eggnog. Beverages High-protein supplement drinks. Soy milk. Other foods Protein bars. The items listed above may not be a complete list of high-protein foods and beverages. Contact a dietitian for more options. What high-calorie foods should I eat? Fruits Dried fruit. Fruit leather. Canned fruit in syrup. Fruit juice. Avocado. Vegetables Vegetables cooked in oil or butter. Fried potatoes. Grains Pasta. Quick breads. Muffins. Pancakes. Ready-to-eat cereal. Meats and other proteins Peanut butter. Nuts and seeds. Dairy Heavy cream. Whipped cream. Cream cheese. Sour cream. Ice cream. Custard. Pudding. Beverages Meal-replacement beverages. Nutrition shakes. Fruit juice. Sugar-sweetened soft drinks. Seasonings and condiments Salad dressing. Mayonnaise. Alfredo sauce. Fruit preserves or jelly. Honey. Syrup. Sweets and desserts Cake. Cookies. Pie. Pastries. Candy bars. Chocolate. Fats and oils Butter or margarine. Oil. Gravy. Other foods Meal-replacement bars. The items listed above may not be a complete list of high-calorie foods and beverages. Contact a dietitian for more options. Summary  A high-protein, high-calorie diet can help you gain  weight or heal faster after an injury, illness, or surgery.  To increase your protein and calories, add ingredients such as whole milk, peanut butter, cheese, beans, meat, or seafood to meal items.  To get enough extra calories each day, include high-calorie foods and beverages at each meal.  Adding a high-calorie drink or shake can be an easy way to help you get enough calories each day. Talk with your healthcare provider or dietitian about the best options for you. This information is not intended to replace advice given to you by your health care provider. Make sure you discuss any questions you have with your health care provider. Document Released: 03/04/2005 Document Revised: 02/14/2017 Document Reviewed: 01/14/2017 Elsevier Patient Education  2020 [employer].

## 2018-10-13 NOTE — Chronic Care Management (AMB) (Signed)
Chronic Care Management   Outreach Note  10/13/2018 Name: Marissa Hess MRN: [id number] DOB: 1934-12-26  Referred by: Guadalupe Maple, MD Reason for referral : Chronic Care Management (MD Referral/Weight Loss )   Chronic Care Management   Initial Visit Note  10/13/2018 Name: Marissa Hess MRN: [id number] DOB: 1934-11-07  Referred by: Guadalupe Maple, MD Reason for referral : Chronic Care Management (MD Referral/Weight Loss )   Marissa Hess is a 84 y.o. year old female who is a primary care patient of Crissman, Jeannette How, MD. The CCM team was consulted for assistance with chronic disease management and care coordination needs.   Review of patient status, including review of consultants reports, relevant laboratory and other test results, and collaboration with appropriate care team members and the patient's provider was performed as part of comprehensive patient evaluation and provision of chronic care management services.    SDOH (Social Determinants of Health) screening performed today. See Care Plan Entry related to challenges with: None  Objective: Noted last 3 encounters weight has not significantly changed. Looked back to previous encounters:  2016       weight 177lbs 2017       weight 150lbs 2018       weight 142lbs   09/2017  weight 138lbs 12/2017  weight 132lbs 01/2018  weight 122lbs   05/2018  weight 125lbs  Wt Readings from Last 3 Encounters:  10/08/18 122 lb (55.3 kg)  09/10/18 120 lb (54.4 kg)  07/31/18 121 lb 9.6 oz (55.2 kg)     Goals Addressed            This Visit's Progress    I am worried about Hess much weight I have lost (pt-stated)       Current Barriers:   Knowledge Deficits related to recent weight loss  Nurse Case Manager Clinical Goal(s):   Over the next 90 days, patient will work with Marissa Hess to address needs related to reported sudden weight loss  Interventions:   Advised patient to add calorie dense foods to diet for example  Carnation onstant breakfast  Reviewed medications with patient and discussed recent ear pain which patient stated was resolving with newly added medication  Discussed plans with patient for ongoing care management follow up and provided patient with direct contact information for care management team  Provided patient with mailed written educational materials related to weight gain  Pharmacy referral for thorough medication review   Patient Self Care Activities:   Currently UNABLE TO independently identify reason for unintentional weight loss  Calls provider office for new concerns or questions  Initial goal documentation         Ms. Marissa Hess was given information about Chronic Care Management services today including:  1. CCM service includes personalized support from designated clinical staff supervised by her physician, including individualized plan of care and coordination with other care providers 2. 24/7 contact phone numbers for assistance for urgent and routine care needs. 3. Service will only be billed when office clinical staff spend 20 minutes or more in a month to coordinate care. 4. Only one practitioner may furnish and bill the service in a calendar month. 5. The patient may stop CCM services at any time (effective at the end of the month) by phone call to the office staff. 6. The patient will be responsible for cost sharing (co-pay) of up to 20% of the service fee (after annual deductible is met).  Patient agreed to services and  verbal consent obtained.   Plan:   The care management team will reach out to the patient again over the next 30 days.  The patient has been provided with contact information for the care management team and has been advised to call with any health related questions or concerns.    Marissa Morse Bettye Sitton RN, BSN Nurse Case Editor, [facility]  [phone]) Business Mobile

## 2018-10-14 ENCOUNTER — Encounter: Payer: Medicare Other | Admitting: Family Medicine

## 2018-10-14 DIAGNOSIS — N186 End stage renal disease: Secondary | ICD-10-CM | POA: Diagnosis not present

## 2018-10-14 DIAGNOSIS — Z992 Dependence on renal dialysis: Secondary | ICD-10-CM | POA: Diagnosis not present

## 2018-10-14 DIAGNOSIS — T82898A Other specified complication of vascular prosthetic devices, implants and grafts, initial encounter: Secondary | ICD-10-CM | POA: Diagnosis not present

## 2018-10-16 DIAGNOSIS — Z992 Dependence on renal dialysis: Secondary | ICD-10-CM | POA: Diagnosis not present

## 2018-10-16 DIAGNOSIS — N186 End stage renal disease: Secondary | ICD-10-CM | POA: Diagnosis not present

## 2018-10-16 DIAGNOSIS — T82898A Other specified complication of vascular prosthetic devices, implants and grafts, initial encounter: Secondary | ICD-10-CM | POA: Diagnosis not present

## 2018-10-19 DIAGNOSIS — N2581 Secondary hyperparathyroidism of renal origin: Secondary | ICD-10-CM | POA: Diagnosis not present

## 2018-10-19 DIAGNOSIS — N186 End stage renal disease: Secondary | ICD-10-CM | POA: Diagnosis not present

## 2018-10-19 DIAGNOSIS — Z992 Dependence on renal dialysis: Secondary | ICD-10-CM | POA: Diagnosis not present

## 2018-10-21 DIAGNOSIS — N186 End stage renal disease: Secondary | ICD-10-CM | POA: Diagnosis not present

## 2018-10-21 DIAGNOSIS — Z992 Dependence on renal dialysis: Secondary | ICD-10-CM | POA: Diagnosis not present

## 2018-10-23 DIAGNOSIS — N186 End stage renal disease: Secondary | ICD-10-CM | POA: Diagnosis not present

## 2018-10-23 DIAGNOSIS — Z992 Dependence on renal dialysis: Secondary | ICD-10-CM | POA: Diagnosis not present

## 2018-10-26 DIAGNOSIS — Z992 Dependence on renal dialysis: Secondary | ICD-10-CM | POA: Diagnosis not present

## 2018-10-26 DIAGNOSIS — N2581 Secondary hyperparathyroidism of renal origin: Secondary | ICD-10-CM | POA: Diagnosis not present

## 2018-10-26 DIAGNOSIS — N186 End stage renal disease: Secondary | ICD-10-CM | POA: Diagnosis not present

## 2018-10-27 ENCOUNTER — Telehealth: Payer: Self-pay

## 2018-10-30 DIAGNOSIS — Z992 Dependence on renal dialysis: Secondary | ICD-10-CM | POA: Diagnosis not present

## 2018-10-30 DIAGNOSIS — N186 End stage renal disease: Secondary | ICD-10-CM | POA: Diagnosis not present

## 2018-11-02 DIAGNOSIS — N186 End stage renal disease: Secondary | ICD-10-CM | POA: Diagnosis not present

## 2018-11-02 DIAGNOSIS — Z992 Dependence on renal dialysis: Secondary | ICD-10-CM | POA: Diagnosis not present

## 2018-11-03 ENCOUNTER — Ambulatory Visit (INDEPENDENT_AMBULATORY_CARE_PROVIDER_SITE_OTHER): Payer: Medicare Other | Admitting: Pharmacist

## 2018-11-03 DIAGNOSIS — N186 End stage renal disease: Secondary | ICD-10-CM

## 2018-11-03 DIAGNOSIS — R634 Abnormal weight loss: Secondary | ICD-10-CM

## 2018-11-03 NOTE — Patient Instructions (Signed)
Visit Information  Goals Addressed            This Visit's Progress     Patient Stated   . "Are my medications making me lose weight?" (pt-stated)       Current Barriers:  . Polypharmacy; complex patient with multiple comorbidities including ESRD on HD MWF, HLD,  . Particularly concerned with weight loss over the past few years. Reported feeling more fatigued to Dr. Jeananne Rama (particularly after dialysis), and her hair falling out.  . Self-manages medications. Denies concerns with adherence o Allergies: cetirizine daily, azelastine nasal spray daily o HTN: carvedilol 25 mg BID o Anemia in CKD: ferrous sulfate daily, folic acid 1 mg daily o Health maintenance: aspirin 81 mg daily, vitamin D 1000 units daily o Restless leg syndrome: gabapentin 100 mg every other day  Pharmacist Clinical Goal(s):  Marland Kitchen Over the next 90 days, patient will work with PharmD and provider towards optimized medication management  Interventions: . Comprehensive medication review performed; medication list updated in electronic medical record . Patient asked which day to take gabapentin. Recommended to take in evenings after dialysis days.  . Does not appear that any medications are contributing to weight loss. Encouraged patient to discuss these symptoms (including hair loss and fatigue) with Merrie Roof, PA at her upcoming appointment next month  Patient Self Care Activities:  . Patient will take medications as prescribed  Initial goal documentation        The patient verbalized understanding of instructions provided today and declined a print copy of patient instruction materials.   Plan: - CCM team will outreach patient in the next 30 days. I will outreach patient in the next 6-8 weeks for continued medication management support.   Catie Darnelle Maffucci, PharmD Clinical Pharmacist [place] [phone]

## 2018-11-03 NOTE — Chronic Care Management (AMB) (Signed)
Chronic Care Management   Note  11/03/2018 Name: Marissa Hess MRN: [id number] DOB: 1934/09/25   Subjective:  Marissa Hess is a 84 y.o. year old female who is a primary care patient of Crissman, Jeannette How, MD. The CCM team was consulted for assistance with chronic disease management and care coordination needs.    Conducted telephonic medication review today per request from RN CM.  Review of patient status, including review of consultants reports, laboratory and other test data, was performed as part of comprehensive evaluation and provision of chronic care management services.   Objective:  Lab Results  Component Value Date   CREATININE 5.05 (H) 01/26/2018   CREATININE 5.18 (H) 01/23/2018   CREATININE 4.73 (H) 01/14/2018    No results found for: HGBA1C     Component Value Date/Time   CHOL 252 (H) 10/08/2017 1304   TRIG 169 (H) 10/08/2017 1304   HDL 39 (L) 10/08/2017 1304   CHOLHDL 6.5 (H) 10/08/2017 1304   LDLCALC 179 (H) 10/08/2017 1304    Clinical ASCVD: No    BP Readings from Last 3 Encounters:  10/08/18 113/73  09/29/18 136/67  07/31/18 (!) 144/75    Allergies  Allergen Reactions  . Codeine Itching  . Ibuprofen     Avoids "because of lupus"  . Sulfa Antibiotics Diarrhea  . Vioxx [Rofecoxib]     Avoids "because of lupus"  . Celebrex [Celecoxib] Rash    Avoids "because of lupus"  . Penicillins Rash    Medications Reviewed Today    Reviewed by De Hollingshead, [facility] (Pharmacist) on 11/03/18 at 1430  Med List Status: <None>  Medication Order Taking? Sig Documenting Provider Last Dose Status Informant  aspirin EC 81 MG tablet [id number] Yes Take 81 mg by mouth daily. [provider] Taking Active Self  azelastine (ASTELIN) 0.1 % nasal spray [id number] Yes Place 1 spray into both nostrils 2 (two) times daily. Use in each nostril as directed Volney American, PA-C Taking Active   carvedilol (COREG) 25 MG tablet [id number] Yes Take 1 tablet  (25 mg total) by mouth 2 (two) times daily with a meal. Crissman, Jeannette How, MD Taking Active Self  cetirizine (ZYRTEC) 10 MG tablet [id number] Yes Take 1 tablet (10 mg total) by mouth daily. Volney American, [place] Taking Active   cholecalciferol (VITAMIN D) 1000 units tablet [id number] Yes Take 1,000 Units by mouth daily.  [provider] Taking Active Self  ferrous sulfate (SLOW RELEASE IRON) 160 (50 Fe) MG TBCR SR tablet [id number] Yes Take 1 tablet by mouth daily.  [provider] Taking Active Self  fluticasone (FLONASE) 50 MCG/ACT nasal spray [id number] No Place 1 spray into both nostrils 2 (two) times daily.  Patient not taking: Reported on 11/03/2018   Volney American, PA-C Not Taking Active   folic acid (FOLVITE) 1 MG tablet [id number] Yes Take 1 tablet (1 mg total) by mouth daily. Guadalupe Maple, MD Taking Active Self  gabapentin (NEURONTIN) 100 MG capsule [id number] Yes 1 cap every other day Guadalupe Maple, MD Taking Active            Assessment:   Goals Addressed            This Visit's Progress     Patient Stated   . "Are my medications making me lose weight?" (pt-stated)       Current Barriers:  . Polypharmacy; complex patient with multiple comorbidities including ESRD on HD MWF, HLD,  .  Particularly concerned with weight loss over the past few years. Reported feeling more fatigued to Dr. Jeananne Rama (particularly after dialysis), and her hair falling out.  . Self-manages medications. Denies concerns with adherence o Allergies: cetirizine daily, azelastine nasal spray daily o HTN: carvedilol 25 mg BID o Anemia in CKD: ferrous sulfate daily, folic acid 1 mg daily o Health maintenance: aspirin 81 mg daily, vitamin D 1000 units daily o Restless leg syndrome: gabapentin 100 mg every other day  Pharmacist Clinical Goal(s):  Marland Kitchen Over the next 90 days, patient will work with PharmD and provider towards optimized medication management  Interventions:  . Comprehensive medication review performed; medication list updated in electronic medical record . Patient asked which day to take gabapentin. Recommended to take in evenings after dialysis days.  . Does not appear that any medications are contributing to weight loss. Encouraged patient to discuss these symptoms (including hair loss and fatigue) with Merrie Roof, PA at her upcoming appointment next month  Patient Self Care Activities:  . Patient will take medications as prescribed  Initial goal documentation        Plan: - CCM team will outreach patient in the next 30 days. I will outreach patient in the next 6-8 weeks for continued medication management support.   Catie Darnelle Maffucci, PharmD Clinical Pharmacist [place] [phone]

## 2018-11-04 DIAGNOSIS — N186 End stage renal disease: Secondary | ICD-10-CM | POA: Diagnosis not present

## 2018-11-04 DIAGNOSIS — Z992 Dependence on renal dialysis: Secondary | ICD-10-CM | POA: Diagnosis not present

## 2018-11-06 DIAGNOSIS — Z992 Dependence on renal dialysis: Secondary | ICD-10-CM | POA: Diagnosis not present

## 2018-11-06 DIAGNOSIS — N186 End stage renal disease: Secondary | ICD-10-CM | POA: Diagnosis not present

## 2018-11-09 DIAGNOSIS — N2581 Secondary hyperparathyroidism of renal origin: Secondary | ICD-10-CM | POA: Diagnosis not present

## 2018-11-09 DIAGNOSIS — Z992 Dependence on renal dialysis: Secondary | ICD-10-CM | POA: Diagnosis not present

## 2018-11-09 DIAGNOSIS — N186 End stage renal disease: Secondary | ICD-10-CM | POA: Diagnosis not present

## 2018-11-11 DIAGNOSIS — Z992 Dependence on renal dialysis: Secondary | ICD-10-CM | POA: Diagnosis not present

## 2018-11-11 DIAGNOSIS — N186 End stage renal disease: Secondary | ICD-10-CM | POA: Diagnosis not present

## 2018-11-13 DIAGNOSIS — N186 End stage renal disease: Secondary | ICD-10-CM | POA: Diagnosis not present

## 2018-11-13 DIAGNOSIS — Z992 Dependence on renal dialysis: Secondary | ICD-10-CM | POA: Diagnosis not present

## 2018-11-16 DIAGNOSIS — Z992 Dependence on renal dialysis: Secondary | ICD-10-CM | POA: Diagnosis not present

## 2018-11-16 DIAGNOSIS — N186 End stage renal disease: Secondary | ICD-10-CM | POA: Diagnosis not present

## 2018-11-17 ENCOUNTER — Telehealth: Payer: Self-pay

## 2018-11-17 ENCOUNTER — Ambulatory Visit: Payer: Self-pay | Admitting: *Deleted

## 2018-11-17 DIAGNOSIS — R634 Abnormal weight loss: Secondary | ICD-10-CM

## 2018-11-17 NOTE — Patient Instructions (Signed)
  CCM (Chronic Care Management) Team   Shaneta Cervenka RN, BSN Nurse Care Coordinator  [phone]  Catie [facility] PharmD  Clinical Pharmacist  [phone]  Eula Fried LCSW Clinical Social Worker [phone]  Goals Addressed            This Visit's Progress   . RN-I am worried about how much weight I have lost (pt-stated)       Current Barriers:  Marland Kitchen Knowledge Deficits related to recent weight loss  Nurse Case Manager Clinical Goal(s):  Marland Kitchen Over the next 90 days, patient will work with [facility] to address needs related to reported sudden weight loss  Interventions:  . Nepro samples received from [facility] Rep as requested for patient related to ESRD.  . Called patient to make her aware of samples and talk with her about how things were going, and ask if she had received mailed education, but patient did not answer.  Nash Dimmer with PCP office staff regarding special Nutrition supplement Nepro, ordered for patient. Patient has an upcoming appointment 9/10 they have made a note to give patient the ordered supplement.   Patient Self Care Activities:  . Currently UNABLE TO independently identify reason for unintentional weight loss . Calls provider office for new concerns or questions  Please see past updates related to this goal by clicking on the "Past Updates" button in the selected goal         The patient verbalized understanding of instructions provided today and declined a print copy of patient instruction materials.   The patient has been provided with contact information for the care management team and has been advised to call with any health related questions or concerns.

## 2018-11-17 NOTE — Chronic Care Management (AMB) (Signed)
  Chronic Care Management   Follow Up Note   11/17/2018 Name: Marissa Hess MRN: [id number] DOB: 1934-07-16  Referred by: Guadalupe Maple, MD Reason for referral : Chronic Care Management (Unsucessful Outreach Attempt) and Care Coordination (Nutrition Supplement)   Marissa Hess is a 84 y.o. year old female who is a primary care patient of Crissman, Jeannette How, MD. The CCM team was consulted for assistance with chronic disease management and care coordination needs.    Review of patient status, including review of consultants reports, relevant laboratory and other test results, and collaboration with appropriate care team members and the patient's provider was performed as part of comprehensive patient evaluation and provision of chronic care management services.    SDOH (Social Determinants of Health) screening performed today: None. See Care Plan for related entries.   Outpatient Encounter Medications as of 11/17/2018  Medication Sig  . aspirin EC 81 MG tablet Take 81 mg by mouth daily.  Marland Kitchen azelastine (ASTELIN) 0.1 % nasal spray Place 1 spray into both nostrils 2 (two) times daily. Use in each nostril as directed  . carvedilol (COREG) 25 MG tablet Take 1 tablet (25 mg total) by mouth 2 (two) times daily with a meal.  . cetirizine (ZYRTEC) 10 MG tablet Take 1 tablet (10 mg total) by mouth daily.  . cholecalciferol (VITAMIN D) 1000 units tablet Take 1,000 Units by mouth daily.   . ferrous sulfate (SLOW RELEASE IRON) 160 (50 Fe) MG TBCR SR tablet Take 1 tablet by mouth daily.   . fluticasone (FLONASE) 50 MCG/ACT nasal spray Place 1 spray into both nostrils 2 (two) times daily. (Patient not taking: Reported on 11/03/2018)  . folic acid (FOLVITE) 1 MG tablet Take 1 tablet (1 mg total) by mouth daily.  Marland Kitchen gabapentin (NEURONTIN) 100 MG capsule 1 cap every other day   No facility-administered encounter medications on file as of 11/17/2018.      Goals Addressed            This Visit's Progress   . RN-I am worried about how much weight I have lost (pt-stated)       Current Barriers:  Marland Kitchen Knowledge Deficits related to recent weight loss  Nurse Case Manager Clinical Goal(s):  Marland Kitchen Over the next 90 days, patient will work with [facility] to address needs related to reported sudden weight loss  Interventions:  . Nepro samples received from [facility] as requested for patient related to ESRD.  . Called patient to make her aware of samples and talk with her about how things were going, and ask if she had received mailed education, but patient did not answer.  Nash Dimmer with PCP office staff regarding special Nutrition supplement Nepro, ordered for patient. Patient has an upcoming appointment 9/10 they have made a note to give patient the ordered supplement.   Patient Self Care Activities:  . Currently UNABLE TO independently identify reason for unintentional weight loss . Calls provider office for new concerns or questions  Please see past updates related to this goal by clicking on the "Past Updates" button in the selected goal          A HIPPA compliant phone message was left for the patient providing contact information and requesting a return call.  The care management team will reach out to the patient again over the next 30 days.    Merlene Morse Zaven Klemens RN, BSN Nurse Case Editor, [facility]ing [facility] Care Management  [phone]) Business Mobile

## 2018-11-20 DIAGNOSIS — N186 End stage renal disease: Secondary | ICD-10-CM | POA: Diagnosis not present

## 2018-11-20 DIAGNOSIS — Z992 Dependence on renal dialysis: Secondary | ICD-10-CM | POA: Diagnosis not present

## 2018-11-23 IMAGING — CR DG CHEST 2V
1 series · 2 of 2 positions shown · non-contrast
Comparison: Chest x-ray dated January 13, 2018

CLINICAL DATA: Follow-up of left lower lobe pneumonia.

EXAM:
CHEST - 2 VIEW

[Series 1: dg chest 2 view · 0.14mm/px · 2 of 2 slices shown]
[im 1/2]
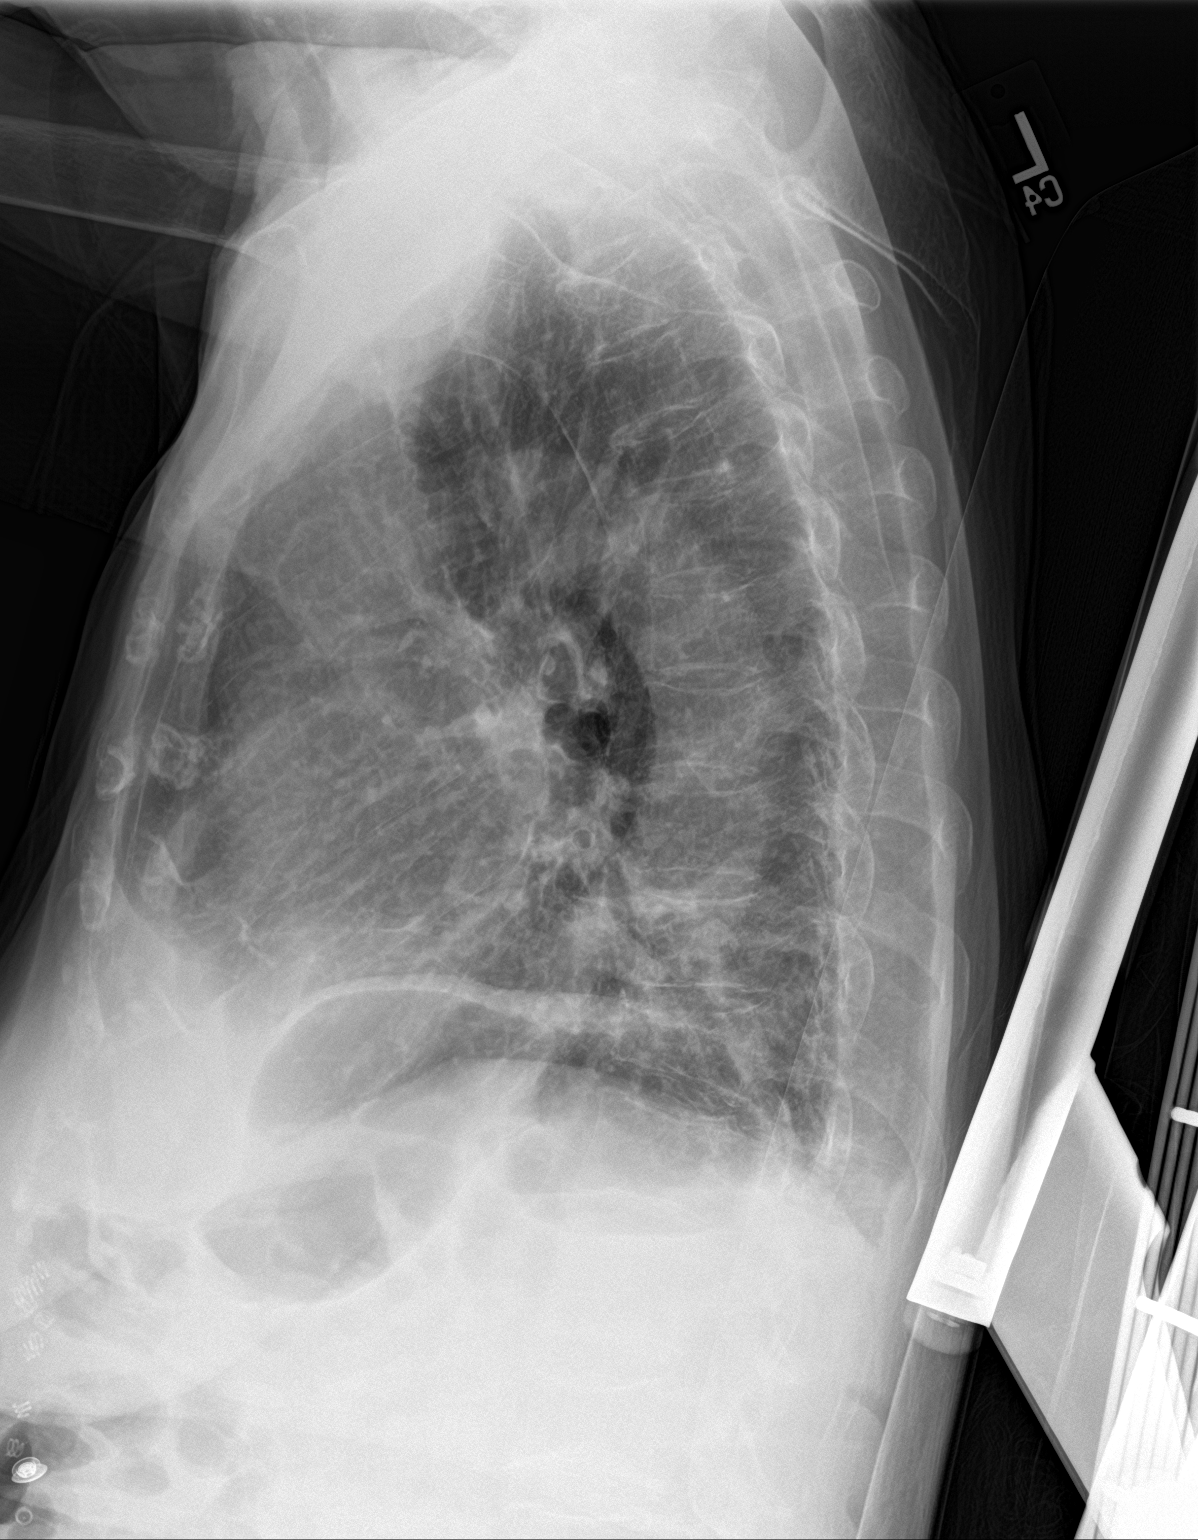
[im 2/2]
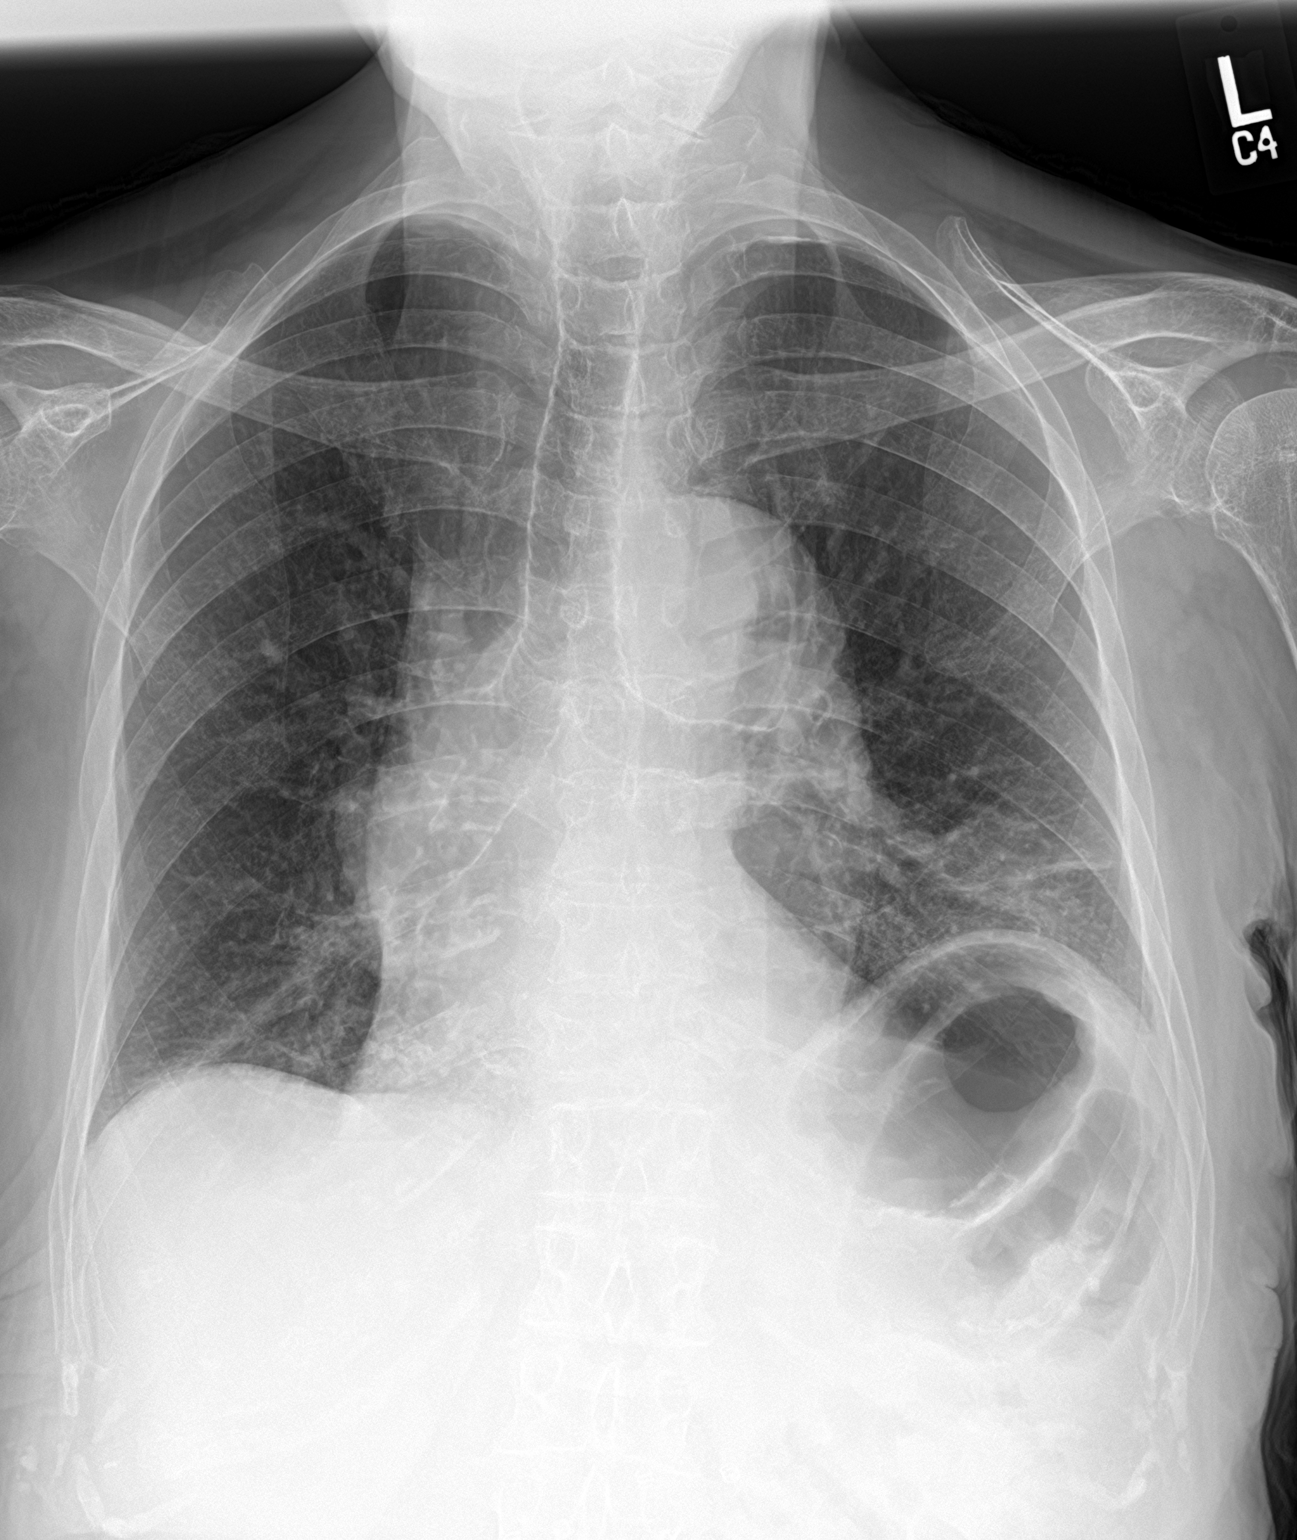

[2 of 2 positions shown; findings below may reference images not displayed]

FINDINGS: There is persistent increased density in the left lower lobe
posteriorly. The right lung and the left upper lobe exhibit no acute
abnormalities. The heart is top-normal in size. The pulmonary
vascularity is normal. There is tortuosity of the ascending and
descending thoracic aorta. There is mild loss of height of the body
of approximately T10 which is stable.
IMPRESSION: Chronic bronchitic changes. Persistent infiltrate in the left lower
lobe. Correlation with the patient's clinical exam and current
symptoms is needed. If the patient has been responding to previous
antibiotic therapy and is clinically improved, then a follow-up
chest x-ray in 3-4 weeks would be useful to assure complete
clearing. If the patient has had no significant improvement or is
deteriorating clinically, chest CT scanning now would be the most
useful next imaging step.

## 2018-11-25 DIAGNOSIS — N186 End stage renal disease: Secondary | ICD-10-CM | POA: Diagnosis not present

## 2018-11-25 DIAGNOSIS — Z992 Dependence on renal dialysis: Secondary | ICD-10-CM | POA: Diagnosis not present

## 2018-11-26 ENCOUNTER — Other Ambulatory Visit: Payer: Self-pay

## 2018-11-26 ENCOUNTER — Encounter: Payer: Self-pay | Admitting: Family Medicine

## 2018-11-26 ENCOUNTER — Ambulatory Visit (INDEPENDENT_AMBULATORY_CARE_PROVIDER_SITE_OTHER): Payer: Medicare Other | Admitting: Family Medicine

## 2018-11-26 VITALS — BP 124/78 | HR 70 | Temp 98.8°F | Ht 62.5 in | Wt 120.0 lb

## 2018-11-26 DIAGNOSIS — M810 Age-related osteoporosis without current pathological fracture: Secondary | ICD-10-CM

## 2018-11-26 DIAGNOSIS — Z23 Encounter for immunization: Secondary | ICD-10-CM

## 2018-11-26 DIAGNOSIS — Z0000 Encounter for general adult medical examination without abnormal findings: Secondary | ICD-10-CM | POA: Diagnosis not present

## 2018-11-26 DIAGNOSIS — M3214 Glomerular disease in systemic lupus erythematosus: Secondary | ICD-10-CM

## 2018-11-26 DIAGNOSIS — R634 Abnormal weight loss: Secondary | ICD-10-CM

## 2018-11-26 DIAGNOSIS — N184 Chronic kidney disease, stage 4 (severe): Secondary | ICD-10-CM | POA: Diagnosis not present

## 2018-11-26 DIAGNOSIS — M1A9XX1 Chronic gout, unspecified, with tophus (tophi): Secondary | ICD-10-CM | POA: Diagnosis not present

## 2018-11-26 DIAGNOSIS — I129 Hypertensive chronic kidney disease with stage 1 through stage 4 chronic kidney disease, or unspecified chronic kidney disease: Secondary | ICD-10-CM

## 2018-11-26 DIAGNOSIS — D510 Vitamin B12 deficiency anemia due to intrinsic factor deficiency: Secondary | ICD-10-CM

## 2018-11-26 DIAGNOSIS — N186 End stage renal disease: Secondary | ICD-10-CM

## 2018-11-26 DIAGNOSIS — D631 Anemia in chronic kidney disease: Secondary | ICD-10-CM

## 2018-11-26 DIAGNOSIS — E785 Hyperlipidemia, unspecified: Secondary | ICD-10-CM | POA: Diagnosis not present

## 2018-11-26 DIAGNOSIS — J301 Allergic rhinitis due to pollen: Secondary | ICD-10-CM

## 2018-11-26 DIAGNOSIS — I10 Essential (primary) hypertension: Secondary | ICD-10-CM

## 2018-11-26 MED ORDER — GABAPENTIN 100 MG PO CAPS: ORAL_CAPSULE | ORAL | 1 refills | Status: DC

## 2018-11-26 MED ORDER — CETIRIZINE HCL 10 MG PO TABS: 10.0000 mg | ORAL_TABLET | Freq: Every day | ORAL | 2 refills | Status: DC

## 2018-11-26 MED ORDER — FOLIC ACID 1 MG PO TABS: 1.0000 mg | ORAL_TABLET | Freq: Every day | ORAL | 12 refills | Status: DC

## 2018-11-26 MED ORDER — CARVEDILOL 25 MG PO TABS: 25.0000 mg | ORAL_TABLET | Freq: Two times a day (BID) | ORAL | 12 refills | Status: DC

## 2018-11-26 NOTE — Progress Notes (Signed)
BP 124/78   Pulse 70   Temp 98.8 F (37.1 C) (Oral)   Ht 5' 2.5" (1.588 m)   Wt 120 lb (54.4 kg)   SpO2 97%   BMI 21.60 kg/m    Subjective:    Patient ID: Marissa Hess, female    DOB: 1934/05/18, 84 y.o.   MRN: [id number]  HPI: Marissa Hess is a 84 y.o. female presenting on 11/26/2018 for comprehensive medical examination. 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 Doing fairly well now that she's adjusted to this new routine. On coreg which controls her BPs and HR well. Sometimes drops low during dialysis treatments but otherwise tolerating well.   Last gout flare was over a year ago. Tries to watch diet.   On zyrtec and astelin nasal spray for allergic rhinitis, working well for her.   She currently lives with: Menopausal Symptoms: no  Depression Screen done today and results listed below:  Depression screen [facility] 2/9 11/26/2018 09/10/2018 09/03/2017 03/19/2017 08/29/2016  Decreased Interest 1 0 2 0 0  Down, Depressed, Hopeless 0 0 0 0 0  PHQ - 2 Score 1 0 2 0 0  Altered sleeping 2 - 0 - -  Tired, decreased energy 1 - 1 - -  Change in appetite 0 - 0 - -  Feeling bad or failure about yourself  0 - 0 - -  Trouble concentrating 0 - 0 - -  Moving slowly or fidgety/restless 0 - 0 - -  Suicidal thoughts 0 - 0 - -  PHQ-9 Score 4 - 3 - -  Difficult doing work/chores - - Not difficult at all - -    The patient does not have a history of falls. I did complete a risk assessment for falls. A plan of care for falls was documented.   Past Medical History:  Past Medical History:  Diagnosis Date  . Allergy   . Arthritis   . Chronic kidney disease    CKD V  . GERD (gastroesophageal reflux disease)   . Hypertension   . Lupus ([place]) 2002  . Macular degeneration of left eye   . Myocardial infarction ([place]) 2007  . Osteoporosis     Surgical History:  Past Surgical History:  Procedure Laterality Date  . A/V  FISTULAGRAM Right 01/16/2018   Procedure: A/V FISTULAGRAM;  Surgeon: [name], MD;  Location: [place] [facility];  Service: Cardiovascular;  Laterality: Right;  . ABDOMINAL HYSTERECTOMY    . AV FISTULA PLACEMENT Right 12/17/2017   Procedure: ARTERIOVENOUS (AV) FISTULA CREATION ( BRACHIO CEPHALIC POSS. BRACHIO BASILIC );  Surgeon: Algernon Huxley, MD;  Location: [facility];  Service: Vascular;  Laterality: Right;  . CATARACT EXTRACTION W/PHACO Right 01/03/2016   Procedure: CATARACT EXTRACTION PHACO AND INTRAOCULAR LENS PLACEMENT ([place]);  Surgeon: Leandrew Koyanagi, MD;  Location: [place];  Service: Ophthalmology;  Laterality: Right;  . COLON SURGERY     intestine became twisted after hernia sugery  . EYE SURGERY     cataract  . HERNIA REPAIR    . TEMPORAL ARTERY BIOPSY / LIGATION    . TONSILLECTOMY      Medications:  Current Outpatient Medications on File Prior to Visit  Medication Sig  . aspirin EC 81 MG tablet Take 81 mg by mouth daily.  Marland Kitchen azelastine (ASTELIN) 0.1 % nasal spray Place into both nostrils 2 (two) times daily. Place 1 spray into both nostril 2 (two)  times daily. Use in each nostril as directed. Starting Thu 10/08/2018  . cholecalciferol (VITAMIN D) 1000 units tablet Take 1,000 Units by mouth daily.   . ferrous sulfate (SLOW RELEASE IRON) 160 (50 Fe) MG TBCR SR tablet Take 1 tablet by mouth daily.    No current facility-administered medications on file prior to visit.     Allergies:  Allergies  Allergen Reactions  . Codeine Itching  . Ibuprofen     Avoids "because of lupus"  . Sulfa Antibiotics Diarrhea  . Vioxx [Rofecoxib]     Avoids "because of lupus"  . Celebrex [Celecoxib] Rash    Avoids "because of lupus"  . Penicillins Rash    Social History:  Social History   Socioeconomic History  . Marital status: Married    Spouse name: Not on file  . Number of children: Not on file  . Years of education: Not on file  . Highest education level:  High school graduate  Occupational History    Comment: Recruitment consultant; [employer]; child care  Social Needs  . Financial resource strain: Not hard at all  . Food insecurity    Worry: Never true    Inability: Never true  . Transportation needs    Medical: No    Non-medical: No  Tobacco Use  . Smoking status: Former Smoker    Types: Cigarettes    Start date: 09/11/1984    Quit date: 09/26/1994    Years since quitting: 24.1  . Smokeless tobacco: Never Used  Substance and Sexual Activity  . Alcohol use: No    Alcohol/week: 0.0 standard drinks  . Drug use: No  . Sexual activity: Not on file  Lifestyle  . Physical activity    Days per week: 0 days    Minutes per session: 0 min  . Stress: Not at all  Relationships  . Social connections    Talks on phone: More than three times a week    Gets together: More than three times a week    Attends religious service: More than 4 times per year    Active member of club or organization: Yes    Attends meetings of clubs or organizations: More than 4 times per year    Relationship status: Married  . Intimate partner violence    Fear of current or ex partner: No    Emotionally abused: No    Physically abused: No    Forced sexual activity: No  Other Topics Concern  . Not on file  Social History Narrative  . Not on file   Social History   Tobacco Use  Smoking Status Former Smoker  . Types: Cigarettes  . Start date: 09/11/1984  . Quit date: 09/26/1994  . Years since quitting: 24.1  Smokeless Tobacco Never Used   Social History   Substance and Sexual Activity  Alcohol Use No  . Alcohol/week: 0.0 standard drinks    Family History:  Family History  Problem Relation Age of Onset  . Stomach cancer Mother   . Rheum arthritis Father   . Breast cancer Neg Hx     Past medical history, surgical history, medications, allergies, family history and social history reviewed with patient today and changes made to appropriate areas of the chart.    Review of Systems - General ROS: negative Psychological ROS: negative Ophthalmic ROS: negative ENT ROS: negative Allergy and Immunology ROS: negative Hematological and Lymphatic ROS: negative Endocrine ROS: negative Breast ROS: negative for breast lumps Respiratory ROS: no  cough, shortness of breath, or wheezing Cardiovascular ROS: no chest pain or dyspnea on exertion Gastrointestinal ROS: no abdominal pain, change in bowel habits, or black or bloody stools Genito-Urinary ROS: no dysuria, trouble voiding, or hematuria Musculoskeletal ROS: negative Neurological ROS: no TIA or stroke symptoms Dermatological ROS: negative All other ROS negative except what is listed above and in the HPI.      Objective:    BP 124/78   Pulse 70   Temp 98.8 F (37.1 C) (Oral)   Ht 5' 2.5" (1.588 m)   Wt 120 lb (54.4 kg)   SpO2 97%   BMI 21.60 kg/m   Wt Readings from Last 3 Encounters:  11/26/18 120 lb (54.4 kg)  10/08/18 122 lb (55.3 kg)  09/10/18 120 lb (54.4 kg)    Physical Exam Vitals signs and nursing note reviewed.  Constitutional:      General: She is not in acute distress.    Appearance: She is well-developed.  HENT:     Head: Atraumatic.     Right Ear: External ear normal.     Left Ear: External ear normal.     Nose: Nose normal.     Mouth/Throat:     Pharynx: No oropharyngeal exudate.  Eyes:     General: No scleral icterus.    Conjunctiva/sclera: Conjunctivae normal.     Pupils: Pupils are equal, round, and reactive to light.  Neck:     Musculoskeletal: Normal range of motion and neck supple.     Thyroid: No thyromegaly.  Cardiovascular:     Rate and Rhythm: Normal rate and regular rhythm.     Heart sounds: Normal heart sounds.  Pulmonary:     Effort: Pulmonary effort is normal. No respiratory distress.     Breath sounds: Normal breath sounds.  Chest:     Breasts:        Right: No mass, skin change or tenderness.        Left: No mass, skin change or tenderness.   Abdominal:     General: Bowel sounds are normal.     Palpations: Abdomen is soft. There is no mass.     Tenderness: There is no abdominal tenderness.  Genitourinary:    Comments: GU exam declined Musculoskeletal: Normal range of motion.        General: No tenderness.  Lymphadenopathy:     Cervical: No cervical adenopathy.     Upper Body:     Right upper body: No axillary adenopathy.     Left upper body: No axillary adenopathy.  Skin:    General: Skin is warm and dry.     Findings: No rash.  Neurological:     Mental Status: She is alert and oriented to person, place, and time.     Cranial Nerves: No cranial nerve deficit.  Psychiatric:        Behavior: Behavior normal.     Results for orders placed or performed in visit on 11/26/18  Microscopic Examination   URINE  Result Value Ref Range   WBC, UA 6-10 (A) 0 - 5 /hpf   RBC 0-2 0 - 2 /hpf   Epithelial Cells (non renal) 0-10 0 - 10 /hpf   Casts Present None seen /lpf   Cast Type Hyaline casts N/A   Bacteria, UA Few (A) None seen/Few  Urine Culture, Reflex   URINE  Result Value Ref Range   Urine Culture, Routine Final report    Organism ID, Bacteria Comment  CBC with Differential/Platelet  Result Value Ref Range   WBC 5.1 3.4 - 10.8 x10E3/uL   RBC 3.91 3.77 - 5.28 x10E6/uL   Hemoglobin 11.2 11.1 - 15.9 g/dL   Hematocrit 34.4 34.0 - 46.6 %   MCV 88 79 - 97 fL   MCH 28.6 26.6 - 33.0 pg   MCHC 32.6 31.5 - 35.7 g/dL   RDW 14.9 11.7 - 15.4 %   Platelets 207 150 - 450 x10E3/uL   Neutrophils 63 Not Estab. %   Lymphs 21 Not Estab. %   Monocytes 12 Not Estab. %   Eos 3 Not Estab. %   Basos 1 Not Estab. %   Neutrophils Absolute 3.2 1.4 - 7.0 x10E3/uL   Lymphocytes Absolute 1.1 0.7 - 3.1 x10E3/uL   Monocytes Absolute 0.6 0.1 - 0.9 x10E3/uL   EOS (ABSOLUTE) 0.1 0.0 - 0.4 x10E3/uL   Basophils Absolute 0.1 0.0 - 0.2 x10E3/uL   Immature Granulocytes 0 Not Estab. %   Immature Grans (Abs) 0.0 0.0 - 0.1 x10E3/uL   Comprehensive metabolic panel  Result Value Ref Range   Glucose 72 65 - 99 mg/dL   BUN 33 (H) 8 - 27 mg/dL   Creatinine, Ser 2.48 (H) 0.57 - 1.00 mg/dL   GFR calc non Af Amer 18 (L) >59 mL/min/1.73   GFR calc Af Amer 20 (L) >59 mL/min/1.73   BUN/Creatinine Ratio 13 12 - 28   Sodium 140 134 - 144 mmol/L   Potassium 3.7 3.5 - 5.2 mmol/L   Chloride 98 96 - 106 mmol/L   CO2 28 20 - 29 mmol/L   Calcium 8.8 8.7 - 10.3 mg/dL   Total Protein 6.9 6.0 - 8.5 g/dL   Albumin 3.4 (L) 3.6 - 4.6 g/dL   Globulin, Total 3.5 1.5 - 4.5 g/dL   Albumin/Globulin Ratio 1.0 (L) 1.2 - 2.2   Bilirubin Total 0.5 0.0 - 1.2 mg/dL   Alkaline Phosphatase 78 39 - 117 IU/L   AST 11 0 - 40 IU/L   ALT 7 0 - 32 IU/L  Lipid Panel w/o Chol/HDL Ratio  Result Value Ref Range   Cholesterol, Total 187 100 - 199 mg/dL   Triglycerides 110 0 - 149 mg/dL   HDL 51 >39 mg/dL   VLDL Cholesterol Cal 20 5 - 40 mg/dL   LDL Chol Calc (NIH) 116 (H) 0 - 99 mg/dL  TSH  Result Value Ref Range   TSH 3.570 0.450 - 4.500 uIU/mL  UA/M w/rflx Culture, Routine   Specimen: Urine   URINE  Result Value Ref Range   Specific Gravity, UA 1.015 1.005 - 1.030   pH, UA 7.5 5.0 - 7.5   Color, UA Yellow Yellow   Appearance Ur Clear Clear   Leukocytes,UA 1+ (A) Negative   Protein,UA 2+ (A) Negative/Trace   Glucose, UA Negative Negative   Ketones, UA Negative Negative   RBC, UA Trace (A) Negative   Bilirubin, UA Negative Negative   Urobilinogen, Ur 0.2 0.2 - 1.0 mg/dL   Nitrite, UA Negative Negative   Microscopic Examination See below:    Urinalysis Reflex Comment       Assessment & Plan:   Problem List Items Addressed This Visit      Respiratory   Allergic rhinitis    Stable and under good control, continue current regimen        Musculoskeletal and Integument   Chronic tophaceous gout (Chronic)    Without recent exacerbation. Continue dietary modifications to improve  control      Senile osteoporosis (Chronic)    On calcium  and vitamin D supplementation. Continue current regimen and exercise as tolerated        Genitourinary   Lupus glomerulonephritis (HCC) (Chronic)    Currently on dialysis      Relevant Medications   folic acid (FOLVITE) 1 MG tablet   Hypertensive nephropathy    Stable and WNL, continue current regimen      Relevant Medications   carvedilol (COREG) 25 MG tablet   Anemia associated with stage 4 chronic renal failure (HCC)   Relevant Medications   folic acid (FOLVITE) 1 MG tablet   ESRD (end stage renal disease) ([place]) - Primary    On dialysis 3 days per week, stable      Relevant Orders   CBC with Differential/Platelet (Completed)   Comprehensive metabolic panel (Completed)   UA/M w/rflx Culture, Routine (Completed)     Other   Pernicious anemia (Chronic)    Recheck labs, adjust as needed. On folic acid supplements      Relevant Medications   folic acid (FOLVITE) 1 MG tablet   Hyperlipemia (Chronic)    Recheck lipids, adjust as needed.       Relevant Medications   carvedilol (COREG) 25 MG tablet   Other Relevant Orders   Lipid Panel w/o Chol/HDL Ratio (Completed)   Weight loss    Start boost drinks and increase meal size as tolerated      Relevant Orders   TSH (Completed)    Other Visit Diagnoses    Annual physical exam       Benign hypertension  (Chronic)      Relevant Medications   carvedilol (COREG) 25 MG tablet   Flu vaccine need       Relevant Orders   Flu Vaccine QUAD High Dose(Fluad) (Completed)       Follow up plan: Return in about 6 months (around 05/26/2019) for 6 month f/u.   LABORATORY TESTING:  - Pap smear: not applicable  IMMUNIZATIONS:   - Tdap: Tetanus vaccination status reviewed: last tetanus booster within 10 years. - Influenza: Administered today - Pneumovax: Up to date - Prevnar: Up to date - Zostavax vaccine: Refused  SCREENING: -Mammogram: Not applicable  - Colonoscopy: Not applicable  - Bone Density: Up to date    PATIENT COUNSELING:   Advised to take 1 mg of folate supplement per day if capable of pregnancy.   Sexuality: Discussed sexually transmitted diseases, partner selection, use of condoms, avoidance of unintended pregnancy  and contraceptive alternatives.   Advised to avoid cigarette smoking.  I discussed with the patient that most people either abstain from alcohol or drink within safe limits (<=14/week and <=4 drinks/occasion for males, <=7/weeks and <= 3 drinks/occasion for females) and that the risk for alcohol disorders and other health effects rises proportionally with the number of drinks per week and how often a drinker exceeds daily limits.  Discussed cessation/primary prevention of drug use and availability of treatment for abuse.   Diet: Encouraged to adjust caloric intake to maintain  or achieve ideal body weight, to reduce intake of dietary saturated fat and total fat, to limit sodium intake by avoiding high sodium foods and not adding table salt, and to maintain adequate dietary potassium and calcium preferably from fresh fruits, vegetables, and low-fat dairy products.    stressed the importance of regular exercise  Injury prevention: Discussed safety belts, safety helmets, smoke detector, smoking near bedding or upholstery.  Dental health: Discussed importance of regular tooth brushing, flossing, and dental visits.    NEXT PREVENTATIVE PHYSICAL DUE IN 1 YEAR. Return in about 6 months (around 05/26/2019) for 6 month f/u.

## 2018-11-27 ENCOUNTER — Encounter: Payer: Self-pay | Admitting: Family Medicine

## 2018-11-27 DIAGNOSIS — N186 End stage renal disease: Secondary | ICD-10-CM | POA: Diagnosis not present

## 2018-11-27 DIAGNOSIS — Z992 Dependence on renal dialysis: Secondary | ICD-10-CM | POA: Diagnosis not present

## 2018-11-27 LAB — CBC WITH DIFFERENTIAL/PLATELET
Basophils Absolute: 0.1 10*3/uL (ref 0.0–0.2)
Basos: 1 %
EOS (ABSOLUTE): 0.1 10*3/uL (ref 0.0–0.4)
Eos: 3 %
Hematocrit: 34.4 % (ref 34.0–46.6)
Hemoglobin: 11.2 g/dL (ref 11.1–15.9)
Immature Grans (Abs): 0 10*3/uL (ref 0.0–0.1)
Immature Granulocytes: 0 %
Lymphocytes Absolute: 1.1 10*3/uL (ref 0.7–3.1)
Lymphs: 21 %
MCH: 28.6 pg (ref 26.6–33.0)
MCHC: 32.6 g/dL (ref 31.5–35.7)
MCV: 88 fL (ref 79–97)
Monocytes Absolute: 0.6 10*3/uL (ref 0.1–0.9)
Monocytes: 12 %
Neutrophils Absolute: 3.2 10*3/uL (ref 1.4–7.0)
Neutrophils: 63 %
Platelets: 207 10*3/uL (ref 150–450)
RBC: 3.91 x10E6/uL (ref 3.77–5.28)
RDW: 14.9 % (ref 11.7–15.4)
WBC: 5.1 10*3/uL (ref 3.4–10.8)

## 2018-11-27 LAB — COMPREHENSIVE METABOLIC PANEL
ALT: 7 IU/L (ref 0–32)
AST: 11 IU/L (ref 0–40)
Albumin/Globulin Ratio: 1 — ABNORMAL LOW (ref 1.2–2.2)
Albumin: 3.4 g/dL — ABNORMAL LOW (ref 3.6–4.6)
Alkaline Phosphatase: 78 IU/L (ref 39–117)
BUN/Creatinine Ratio: 13 (ref 12–28)
BUN: 33 mg/dL — ABNORMAL HIGH (ref 8–27)
Bilirubin Total: 0.5 mg/dL (ref 0.0–1.2)
CO2: 28 mmol/L (ref 20–29)
Calcium: 8.8 mg/dL (ref 8.7–10.3)
Chloride: 98 mmol/L (ref 96–106)
Creatinine, Ser: 2.48 mg/dL — ABNORMAL HIGH (ref 0.57–1.00)
GFR calc Af Amer: 20 mL/min/{1.73_m2} — ABNORMAL LOW (ref >59)
GFR calc non Af Amer: 18 mL/min/{1.73_m2} — ABNORMAL LOW (ref >59)
Globulin, Total: 3.5 g/dL (ref 1.5–4.5)
Glucose: 72 mg/dL (ref 65–99)
Potassium: 3.7 mmol/L (ref 3.5–5.2)
Sodium: 140 mmol/L (ref 134–144)
Total Protein: 6.9 g/dL (ref 6.0–8.5)

## 2018-11-27 LAB — LIPID PANEL W/O CHOL/HDL RATIO
Cholesterol, Total: 187 mg/dL (ref 100–199)
HDL: 51 mg/dL
LDL Chol Calc (NIH): 116 mg/dL — ABNORMAL HIGH (ref 0–99)
Triglycerides: 110 mg/dL (ref 0–149)
VLDL Cholesterol Cal: 20 mg/dL (ref 5–40)

## 2018-11-27 LAB — TSH: TSH: 3.57 u[IU]/mL (ref 0.450–4.500)

## 2018-11-28 LAB — UA/M W/RFLX CULTURE, ROUTINE
Bilirubin, UA: NEGATIVE
Glucose, UA: NEGATIVE
Ketones, UA: NEGATIVE
Nitrite, UA: NEGATIVE
Specific Gravity, UA: 1.015 (ref 1.005–1.030)
Urobilinogen, Ur: 0.2 mg/dL (ref 0.2–1.0)
pH, UA: 7.5 (ref 5.0–7.5)

## 2018-11-28 LAB — URINE CULTURE, REFLEX

## 2018-11-28 LAB — MICROSCOPIC EXAMINATION

## 2018-11-30 DIAGNOSIS — Z992 Dependence on renal dialysis: Secondary | ICD-10-CM | POA: Diagnosis not present

## 2018-11-30 DIAGNOSIS — N186 End stage renal disease: Secondary | ICD-10-CM | POA: Diagnosis not present

## 2018-11-30 DIAGNOSIS — D509 Iron deficiency anemia, unspecified: Secondary | ICD-10-CM | POA: Diagnosis not present

## 2018-11-30 DIAGNOSIS — N2581 Secondary hyperparathyroidism of renal origin: Secondary | ICD-10-CM | POA: Diagnosis not present

## 2018-12-01 NOTE — Assessment & Plan Note (Signed)
Recheck lipids, adjust as needed 

## 2018-12-01 NOTE — Assessment & Plan Note (Signed)
Stable and WNL, continue current regimen 

## 2018-12-01 NOTE — Assessment & Plan Note (Signed)
Start boost drinks and increase meal size as tolerated

## 2018-12-01 NOTE — Assessment & Plan Note (Signed)
Currently on dialysis

## 2018-12-01 NOTE — Assessment & Plan Note (Signed)
Recheck labs, adjust as needed. On folic acid supplements

## 2018-12-01 NOTE — Assessment & Plan Note (Signed)
On dialysis 3 days per week, stable

## 2018-12-01 NOTE — Assessment & Plan Note (Signed)
Without recent exacerbation. Continue dietary modifications to improve control

## 2018-12-01 NOTE — Assessment & Plan Note (Signed)
On calcium and vitamin D supplementation. Continue current regimen and exercise as tolerated

## 2018-12-01 NOTE — Assessment & Plan Note (Signed)
Stable and under good control, continue current regimen 

## 2018-12-04 DIAGNOSIS — N186 End stage renal disease: Secondary | ICD-10-CM | POA: Diagnosis not present

## 2018-12-04 DIAGNOSIS — Z992 Dependence on renal dialysis: Secondary | ICD-10-CM | POA: Diagnosis not present

## 2018-12-07 DIAGNOSIS — N186 End stage renal disease: Secondary | ICD-10-CM | POA: Diagnosis not present

## 2018-12-07 DIAGNOSIS — Z992 Dependence on renal dialysis: Secondary | ICD-10-CM | POA: Diagnosis not present

## 2018-12-11 DIAGNOSIS — N186 End stage renal disease: Secondary | ICD-10-CM | POA: Diagnosis not present

## 2018-12-11 DIAGNOSIS — Z992 Dependence on renal dialysis: Secondary | ICD-10-CM | POA: Diagnosis not present

## 2018-12-14 DIAGNOSIS — Z992 Dependence on renal dialysis: Secondary | ICD-10-CM | POA: Diagnosis not present

## 2018-12-14 DIAGNOSIS — N186 End stage renal disease: Secondary | ICD-10-CM | POA: Diagnosis not present

## 2018-12-16 DIAGNOSIS — Z992 Dependence on renal dialysis: Secondary | ICD-10-CM | POA: Diagnosis not present

## 2018-12-16 DIAGNOSIS — N186 End stage renal disease: Secondary | ICD-10-CM | POA: Diagnosis not present

## 2018-12-18 DIAGNOSIS — Z992 Dependence on renal dialysis: Secondary | ICD-10-CM | POA: Diagnosis not present

## 2018-12-18 DIAGNOSIS — N186 End stage renal disease: Secondary | ICD-10-CM | POA: Diagnosis not present

## 2018-12-21 DIAGNOSIS — N186 End stage renal disease: Secondary | ICD-10-CM | POA: Diagnosis not present

## 2018-12-21 DIAGNOSIS — Z992 Dependence on renal dialysis: Secondary | ICD-10-CM | POA: Diagnosis not present

## 2018-12-21 DIAGNOSIS — N2581 Secondary hyperparathyroidism of renal origin: Secondary | ICD-10-CM | POA: Diagnosis not present

## 2018-12-25 DIAGNOSIS — Z992 Dependence on renal dialysis: Secondary | ICD-10-CM | POA: Diagnosis not present

## 2018-12-25 DIAGNOSIS — N186 End stage renal disease: Secondary | ICD-10-CM | POA: Diagnosis not present

## 2018-12-28 DIAGNOSIS — N2581 Secondary hyperparathyroidism of renal origin: Secondary | ICD-10-CM | POA: Diagnosis not present

## 2018-12-28 DIAGNOSIS — N186 End stage renal disease: Secondary | ICD-10-CM | POA: Diagnosis not present

## 2018-12-28 DIAGNOSIS — Z992 Dependence on renal dialysis: Secondary | ICD-10-CM | POA: Diagnosis not present

## 2018-12-28 DIAGNOSIS — D509 Iron deficiency anemia, unspecified: Secondary | ICD-10-CM | POA: Diagnosis not present

## 2018-12-29 ENCOUNTER — Telehealth: Payer: Self-pay

## 2018-12-30 ENCOUNTER — Ambulatory Visit (INDEPENDENT_AMBULATORY_CARE_PROVIDER_SITE_OTHER): Payer: Medicare Other | Admitting: Pharmacist

## 2018-12-30 DIAGNOSIS — I10 Essential (primary) hypertension: Secondary | ICD-10-CM

## 2018-12-30 DIAGNOSIS — N184 Chronic kidney disease, stage 4 (severe): Secondary | ICD-10-CM

## 2018-12-30 DIAGNOSIS — D631 Anemia in chronic kidney disease: Secondary | ICD-10-CM

## 2018-12-30 DIAGNOSIS — N186 End stage renal disease: Secondary | ICD-10-CM

## 2018-12-30 NOTE — Patient Instructions (Signed)
Visit Information  Goals Addressed            This Visit's Progress     Patient Stated   . PharmD "Are my medications making me lose weight?" (pt-stated)       Current Barriers:  . Polypharmacy; complex patient with multiple comorbidities including ESRD on HD MWF, HLD,  . Particularly concerned with weight loss over the past few years. All lab work, including TSH, was normal at last visit with Merrie Roof, [place]. Patient notes that she was confused by the lab work that she received in the mail . She notes that she picked up the Nephro supplements from the office, and does occasionally drink one of those. Wonders about getting some more or getting some coupons . Also wonders if she can get a scooter to help with mobility. Wonders where you would go to get one of those . Self-manages medications.  o Allergies: cetirizine daily, azelastine nasal spray daily o HTN: carvedilol 25 mg BID o Anemia in CKD: ferrous sulfate daily, folic acid 1 mg daily o Health maintenance: aspirin 81 mg daily, vitamin D 1000 units daily o Restless leg syndrome: gabapentin 100 mg every other day  Pharmacist Clinical Goal(s):  Marland Kitchen Over the next 90 days, patient will work with PharmD and provider towards optimized medication management  Interventions: . Comprehensive medication review performed; medication list updated in electronic medical record . Reviewed recent lab work with patient, explained important labs, normal ranges. Discussed how we are not concerned with renal function labwork now that she is on dialysis. Discussed improvement in hemoglobin; she is likely receiving ESA therapy w/ dialysis . Will collaborate with Janci Minor, RN CM, regarding Nephro supplements and patient's question about a scooter.   Patient Self Care Activities:  . Patient will take medications as prescribed  Please see past updates related to this goal by clicking on the "Past Updates" button in the selected goal         The  patient verbalized understanding of instructions provided today and declined a print copy of patient instruction materials.   Plan:  - Will collaborate with Janci Minor, RN CM on above.  - Will outreach patient in the next 6-8 weeks for continued medication management support  Catie Darnelle Maffucci, PharmD Clinical Pharmacist [place] [phone]

## 2018-12-30 NOTE — Chronic Care Management (AMB) (Signed)
Chronic Care Management   Follow Up Note   12/30/2018 Name: Marissa Hess MRN: [id number] DOB: 02/05/1935  Referred by: Guadalupe Maple, MD Reason for referral : Chronic Care Management (Medication Management)   Marissa Hess is a 84 y.o. year old female who is a primary care patient of Marissa Hess, Marissa How, MD. The CCM team was consulted for assistance with chronic disease management and care coordination needs.    Contacted patient for medication management support today.   Review of patient status, including review of consultants reports, relevant laboratory and other test results, and collaboration with appropriate care team members and the patient's provider was performed as part of comprehensive patient evaluation and provision of chronic care management services.    SDOH (Social Determinants of Health) screening performed today: Transportation. See Care Plan for related entries.   Advanced Directives Status: N See Care Plan and [facility] application for related entries.  Outpatient Encounter Medications as of 12/30/2018  Medication Sig  . aspirin EC 81 MG tablet Take 81 mg by mouth daily.  Marland Kitchen azelastine (ASTELIN) 0.1 % nasal spray Place into both nostrils 2 (two) times daily. Place 1 spray into both nostril 2 (two) times daily. Use in each nostril as directed. Starting Thu 10/08/2018  . carvedilol (COREG) 25 MG tablet Take 1 tablet (25 mg total) by mouth 2 (two) times daily with a meal.  . cetirizine (ZYRTEC) 10 MG tablet Take 1 tablet (10 mg total) by mouth daily.  . cholecalciferol (VITAMIN D) 1000 units tablet Take 1,000 Units by mouth daily.   . ferrous sulfate (SLOW RELEASE IRON) 160 (50 Fe) MG TBCR SR tablet Take 1 tablet by mouth daily.   . folic acid (FOLVITE) 1 MG tablet Take 1 tablet (1 mg total) by mouth daily.  Marland Kitchen gabapentin (NEURONTIN) 100 MG capsule 1 cap every other day   No facility-administered encounter medications on file as of 12/30/2018.      Goals Addressed             This Visit's Progress     Patient Stated   . PharmD "Are my medications making me lose weight?" (pt-stated)       Current Barriers:  . Polypharmacy; complex patient with multiple comorbidities including ESRD on HD MWF, HLD,  . Particularly concerned with weight loss over the past few years. All lab work, including TSH, was normal at last visit with Marissa Hess, [place]. Patient notes that she was confused by the lab work that she received in the mail . She notes that she picked up the Nephro supplements from the office, and does occasionally drink one of those. Wonders about getting some more or getting some coupons . Also wonders if she can get a scooter to help with mobility. Wonders where you would go to get one of those . Self-manages medications.  o Allergies: cetirizine daily, azelastine nasal spray daily o HTN: carvedilol 25 mg BID o Anemia in CKD: ferrous sulfate daily, folic acid 1 mg daily o Health maintenance: aspirin 81 mg daily, vitamin D 1000 units daily o Restless leg syndrome: gabapentin 100 mg every other day  Pharmacist Clinical Goal(s):  Marland Kitchen Over the next 90 days, patient will work with PharmD and provider towards optimized medication management  Interventions: . Comprehensive medication review performed; medication list updated in electronic medical record . Reviewed recent lab work with patient, explained important labs, normal ranges. Discussed Hess we are not concerned with renal function labwork now that she  is on dialysis. Discussed improvement in hemoglobin; she is likely receiving ESA therapy w/ dialysis . Will collaborate with Marissa Minor, RN CM, regarding Nephro supplements and patient's question about a scooter.   Patient Self Care Activities:  . Patient will take medications as prescribed  Please see past updates related to this goal by clicking on the "Past Updates" button in the selected goal          Plan:  - Will collaborate with Marissa Minor,  RN CM on above.  - Will outreach patient in the next 6-8 weeks for continued medication management support  Catie Darnelle Maffucci, PharmD Clinical Pharmacist [place] [phone]

## 2019-01-01 DIAGNOSIS — N186 End stage renal disease: Secondary | ICD-10-CM | POA: Diagnosis not present

## 2019-01-01 DIAGNOSIS — Z992 Dependence on renal dialysis: Secondary | ICD-10-CM | POA: Diagnosis not present

## 2019-01-04 DIAGNOSIS — N186 End stage renal disease: Secondary | ICD-10-CM | POA: Diagnosis not present

## 2019-01-04 DIAGNOSIS — Z992 Dependence on renal dialysis: Secondary | ICD-10-CM | POA: Diagnosis not present

## 2019-01-08 DIAGNOSIS — Z992 Dependence on renal dialysis: Secondary | ICD-10-CM | POA: Diagnosis not present

## 2019-01-08 DIAGNOSIS — N186 End stage renal disease: Secondary | ICD-10-CM | POA: Diagnosis not present

## 2019-01-11 DIAGNOSIS — Z992 Dependence on renal dialysis: Secondary | ICD-10-CM | POA: Diagnosis not present

## 2019-01-11 DIAGNOSIS — N186 End stage renal disease: Secondary | ICD-10-CM | POA: Diagnosis not present

## 2019-01-15 DIAGNOSIS — N186 End stage renal disease: Secondary | ICD-10-CM | POA: Diagnosis not present

## 2019-01-15 DIAGNOSIS — Z992 Dependence on renal dialysis: Secondary | ICD-10-CM | POA: Diagnosis not present

## 2019-01-18 DIAGNOSIS — N186 End stage renal disease: Secondary | ICD-10-CM | POA: Diagnosis not present

## 2019-01-18 DIAGNOSIS — Z992 Dependence on renal dialysis: Secondary | ICD-10-CM | POA: Diagnosis not present

## 2019-01-22 DIAGNOSIS — N186 End stage renal disease: Secondary | ICD-10-CM | POA: Diagnosis not present

## 2019-01-22 DIAGNOSIS — Z992 Dependence on renal dialysis: Secondary | ICD-10-CM | POA: Diagnosis not present

## 2019-01-26 ENCOUNTER — Telehealth: Payer: Self-pay

## 2019-01-27 DIAGNOSIS — N186 End stage renal disease: Secondary | ICD-10-CM | POA: Diagnosis not present

## 2019-01-27 DIAGNOSIS — Z992 Dependence on renal dialysis: Secondary | ICD-10-CM | POA: Diagnosis not present

## 2019-01-29 DIAGNOSIS — N186 End stage renal disease: Secondary | ICD-10-CM | POA: Diagnosis not present

## 2019-01-29 DIAGNOSIS — Z992 Dependence on renal dialysis: Secondary | ICD-10-CM | POA: Diagnosis not present

## 2019-02-01 DIAGNOSIS — N186 End stage renal disease: Secondary | ICD-10-CM | POA: Diagnosis not present

## 2019-02-01 DIAGNOSIS — Z992 Dependence on renal dialysis: Secondary | ICD-10-CM | POA: Diagnosis not present

## 2019-02-01 DIAGNOSIS — N2581 Secondary hyperparathyroidism of renal origin: Secondary | ICD-10-CM | POA: Diagnosis not present

## 2019-02-01 DIAGNOSIS — D509 Iron deficiency anemia, unspecified: Secondary | ICD-10-CM | POA: Diagnosis not present

## 2019-02-02 ENCOUNTER — Other Ambulatory Visit: Payer: Self-pay

## 2019-02-02 ENCOUNTER — Encounter (INDEPENDENT_AMBULATORY_CARE_PROVIDER_SITE_OTHER): Payer: Self-pay

## 2019-02-02 ENCOUNTER — Other Ambulatory Visit (INDEPENDENT_AMBULATORY_CARE_PROVIDER_SITE_OTHER): Payer: Self-pay | Admitting: Vascular Surgery

## 2019-02-02 ENCOUNTER — Ambulatory Visit (INDEPENDENT_AMBULATORY_CARE_PROVIDER_SITE_OTHER): Payer: Medicare Other | Admitting: Nurse Practitioner

## 2019-02-02 ENCOUNTER — Ambulatory Visit (INDEPENDENT_AMBULATORY_CARE_PROVIDER_SITE_OTHER): Payer: Medicare Other

## 2019-02-02 DIAGNOSIS — N186 End stage renal disease: Secondary | ICD-10-CM

## 2019-02-03 ENCOUNTER — Encounter (INDEPENDENT_AMBULATORY_CARE_PROVIDER_SITE_OTHER): Payer: Self-pay | Admitting: Vascular Surgery

## 2019-02-05 DIAGNOSIS — N186 End stage renal disease: Secondary | ICD-10-CM | POA: Diagnosis not present

## 2019-02-05 DIAGNOSIS — Z992 Dependence on renal dialysis: Secondary | ICD-10-CM | POA: Diagnosis not present

## 2019-02-07 DIAGNOSIS — Z992 Dependence on renal dialysis: Secondary | ICD-10-CM | POA: Diagnosis not present

## 2019-02-07 DIAGNOSIS — N186 End stage renal disease: Secondary | ICD-10-CM | POA: Diagnosis not present

## 2019-02-12 DIAGNOSIS — Z992 Dependence on renal dialysis: Secondary | ICD-10-CM | POA: Diagnosis not present

## 2019-02-12 DIAGNOSIS — N186 End stage renal disease: Secondary | ICD-10-CM | POA: Diagnosis not present

## 2019-02-15 ENCOUNTER — Other Ambulatory Visit: Payer: Self-pay | Admitting: Family Medicine

## 2019-02-15 DIAGNOSIS — N186 End stage renal disease: Secondary | ICD-10-CM | POA: Diagnosis not present

## 2019-02-15 DIAGNOSIS — Z992 Dependence on renal dialysis: Secondary | ICD-10-CM | POA: Diagnosis not present

## 2019-02-15 MED ORDER — TRAMADOL HCL 50 MG PO TABS: 50.0000 mg | ORAL_TABLET | Freq: Every day | ORAL | 0 refills | Status: DC | PRN

## 2019-02-15 NOTE — Telephone Encounter (Signed)
Patient notified and verbalized understanding. 

## 2019-02-15 NOTE — Telephone Encounter (Signed)
Pt states she cannot take the ' gabapentin (NEURONTIN) 100 MG capsule Anymore due to her dialysis, and messing with her kidneys. Would like rachel to send in something else.  [place] (N), [place] - [place] [phone] (Phone) [phone] (Fax)

## 2019-02-15 NOTE — Telephone Encounter (Signed)
Sent over tramadol for her to take rarely when tylenol isn't helping. Will need appt for further refills, can be virtual

## 2019-02-19 DIAGNOSIS — N186 End stage renal disease: Secondary | ICD-10-CM | POA: Diagnosis not present

## 2019-02-19 DIAGNOSIS — Z992 Dependence on renal dialysis: Secondary | ICD-10-CM | POA: Diagnosis not present

## 2019-02-22 DIAGNOSIS — N186 End stage renal disease: Secondary | ICD-10-CM | POA: Diagnosis not present

## 2019-02-22 DIAGNOSIS — Z992 Dependence on renal dialysis: Secondary | ICD-10-CM | POA: Diagnosis not present

## 2019-02-26 DIAGNOSIS — Z992 Dependence on renal dialysis: Secondary | ICD-10-CM | POA: Diagnosis not present

## 2019-02-26 DIAGNOSIS — N186 End stage renal disease: Secondary | ICD-10-CM | POA: Diagnosis not present

## 2019-03-01 DIAGNOSIS — D509 Iron deficiency anemia, unspecified: Secondary | ICD-10-CM | POA: Diagnosis not present

## 2019-03-01 DIAGNOSIS — N2581 Secondary hyperparathyroidism of renal origin: Secondary | ICD-10-CM | POA: Diagnosis not present

## 2019-03-01 DIAGNOSIS — N186 End stage renal disease: Secondary | ICD-10-CM | POA: Diagnosis not present

## 2019-03-01 DIAGNOSIS — Z992 Dependence on renal dialysis: Secondary | ICD-10-CM | POA: Diagnosis not present

## 2019-03-05 DIAGNOSIS — Z992 Dependence on renal dialysis: Secondary | ICD-10-CM | POA: Diagnosis not present

## 2019-03-05 DIAGNOSIS — N186 End stage renal disease: Secondary | ICD-10-CM | POA: Diagnosis not present

## 2019-03-07 DIAGNOSIS — N186 End stage renal disease: Secondary | ICD-10-CM | POA: Diagnosis not present

## 2019-03-07 DIAGNOSIS — Z992 Dependence on renal dialysis: Secondary | ICD-10-CM | POA: Diagnosis not present

## 2019-03-09 ENCOUNTER — Telehealth: Payer: Self-pay

## 2019-03-11 DIAGNOSIS — N186 End stage renal disease: Secondary | ICD-10-CM | POA: Diagnosis not present

## 2019-03-11 DIAGNOSIS — Z992 Dependence on renal dialysis: Secondary | ICD-10-CM | POA: Diagnosis not present

## 2019-03-15 DIAGNOSIS — N186 End stage renal disease: Secondary | ICD-10-CM | POA: Diagnosis not present

## 2019-03-15 DIAGNOSIS — Z992 Dependence on renal dialysis: Secondary | ICD-10-CM | POA: Diagnosis not present

## 2019-03-18 DIAGNOSIS — Z992 Dependence on renal dialysis: Secondary | ICD-10-CM | POA: Diagnosis not present

## 2019-03-18 DIAGNOSIS — N186 End stage renal disease: Secondary | ICD-10-CM | POA: Diagnosis not present

## 2019-03-19 DIAGNOSIS — N186 End stage renal disease: Secondary | ICD-10-CM | POA: Diagnosis not present

## 2019-03-19 DIAGNOSIS — Z992 Dependence on renal dialysis: Secondary | ICD-10-CM | POA: Diagnosis not present

## 2019-03-22 DIAGNOSIS — Z992 Dependence on renal dialysis: Secondary | ICD-10-CM | POA: Diagnosis not present

## 2019-03-22 DIAGNOSIS — N186 End stage renal disease: Secondary | ICD-10-CM | POA: Diagnosis not present

## 2019-03-23 ENCOUNTER — Telehealth: Payer: Self-pay | Admitting: Family Medicine

## 2019-03-23 NOTE — Chronic Care Management (AMB) (Signed)
  Care Management   Note  03/23/2019 Name: RASHEMA PEPER MRN: [id number] DOB: Apr 18, 1934  Marissa Hess is a 85 y.o. year old female who is a primary care patient of Crissman, Jeannette How, MD and is actively engaged with the care management team. I reached out to [place] by phone today to assist with re-scheduling a follow up appointment with the RN Case Manager  Follow up plan: Telephone appointment with care management team member scheduled for: 04/20/2019  [place], [place] [facility]  [place], [place] [address] Direct Dial: [email or web address]  Website: [email or web address]

## 2019-03-26 DIAGNOSIS — N186 End stage renal disease: Secondary | ICD-10-CM | POA: Diagnosis not present

## 2019-03-26 DIAGNOSIS — Z992 Dependence on renal dialysis: Secondary | ICD-10-CM | POA: Diagnosis not present

## 2019-03-29 DIAGNOSIS — Z992 Dependence on renal dialysis: Secondary | ICD-10-CM | POA: Diagnosis not present

## 2019-03-29 DIAGNOSIS — D509 Iron deficiency anemia, unspecified: Secondary | ICD-10-CM | POA: Diagnosis not present

## 2019-03-29 DIAGNOSIS — N2581 Secondary hyperparathyroidism of renal origin: Secondary | ICD-10-CM | POA: Diagnosis not present

## 2019-03-29 DIAGNOSIS — N186 End stage renal disease: Secondary | ICD-10-CM | POA: Diagnosis not present

## 2019-04-02 DIAGNOSIS — Z992 Dependence on renal dialysis: Secondary | ICD-10-CM | POA: Diagnosis not present

## 2019-04-02 DIAGNOSIS — N186 End stage renal disease: Secondary | ICD-10-CM | POA: Diagnosis not present

## 2019-04-05 DIAGNOSIS — Z992 Dependence on renal dialysis: Secondary | ICD-10-CM | POA: Diagnosis not present

## 2019-04-05 DIAGNOSIS — N186 End stage renal disease: Secondary | ICD-10-CM | POA: Diagnosis not present

## 2019-04-06 ENCOUNTER — Telehealth: Payer: Self-pay

## 2019-04-09 DIAGNOSIS — Z992 Dependence on renal dialysis: Secondary | ICD-10-CM | POA: Diagnosis not present

## 2019-04-09 DIAGNOSIS — N186 End stage renal disease: Secondary | ICD-10-CM | POA: Diagnosis not present

## 2019-04-12 DIAGNOSIS — Z992 Dependence on renal dialysis: Secondary | ICD-10-CM | POA: Diagnosis not present

## 2019-04-12 DIAGNOSIS — N186 End stage renal disease: Secondary | ICD-10-CM | POA: Diagnosis not present

## 2019-04-16 DIAGNOSIS — N186 End stage renal disease: Secondary | ICD-10-CM | POA: Diagnosis not present

## 2019-04-16 DIAGNOSIS — Z992 Dependence on renal dialysis: Secondary | ICD-10-CM | POA: Diagnosis not present

## 2019-04-19 DIAGNOSIS — Z992 Dependence on renal dialysis: Secondary | ICD-10-CM | POA: Diagnosis not present

## 2019-04-19 DIAGNOSIS — N186 End stage renal disease: Secondary | ICD-10-CM | POA: Diagnosis not present

## 2019-04-20 ENCOUNTER — Ambulatory Visit: Payer: Self-pay | Admitting: General Practice

## 2019-04-20 ENCOUNTER — Telehealth: Payer: Self-pay

## 2019-04-20 NOTE — Chronic Care Management (AMB) (Signed)
  Chronic Care Management   Outreach Note  04/20/2019 Name: Marissa Hess MRN: [id number] DOB: [birth year]  Referred by: Guadalupe Maple, MD Reason for referral : Chronic Care Management (Follow up and call to let patient know her Nepro is at the office)   An unsuccessful telephone outreach was attempted today. The patient was referred to the case management team by for assistance with care management and care coordination. Calling to let the patient know her Nepro Nutritional Supplement is at the office and ready for pick up.   Follow Up Plan: A HIPPA compliant phone message was left for the patient providing contact information and requesting a return call.   Noreene Larsson RN, MSN, [facility] Mobile: [phone]

## 2019-04-23 DIAGNOSIS — N186 End stage renal disease: Secondary | ICD-10-CM | POA: Diagnosis not present

## 2019-04-23 DIAGNOSIS — Z992 Dependence on renal dialysis: Secondary | ICD-10-CM | POA: Diagnosis not present

## 2019-04-26 DIAGNOSIS — Z992 Dependence on renal dialysis: Secondary | ICD-10-CM | POA: Diagnosis not present

## 2019-04-26 DIAGNOSIS — N2581 Secondary hyperparathyroidism of renal origin: Secondary | ICD-10-CM | POA: Diagnosis not present

## 2019-04-26 DIAGNOSIS — D509 Iron deficiency anemia, unspecified: Secondary | ICD-10-CM | POA: Diagnosis not present

## 2019-04-26 DIAGNOSIS — N186 End stage renal disease: Secondary | ICD-10-CM | POA: Diagnosis not present

## 2019-04-30 DIAGNOSIS — N186 End stage renal disease: Secondary | ICD-10-CM | POA: Diagnosis not present

## 2019-04-30 DIAGNOSIS — Z992 Dependence on renal dialysis: Secondary | ICD-10-CM | POA: Diagnosis not present

## 2019-05-03 DIAGNOSIS — Z992 Dependence on renal dialysis: Secondary | ICD-10-CM | POA: Diagnosis not present

## 2019-05-03 DIAGNOSIS — N186 End stage renal disease: Secondary | ICD-10-CM | POA: Diagnosis not present

## 2019-05-05 ENCOUNTER — Telehealth: Payer: Self-pay | Admitting: Family Medicine

## 2019-05-05 NOTE — Telephone Encounter (Signed)
Should be perfectly fine for her to receive vaccine  Copied from [place] [id number]. Topic: General - Inquiry >> May 04, 2019  8:15 AM Richardo Priest, NT wrote: Reason for CRM: Patient called in stating she needs to speak with Apolonio Schneiders in regards to getting vaccine due to being on blood thinners. Pt plans on getting vaccine tomorrow. Please advise.

## 2019-05-05 NOTE — Telephone Encounter (Signed)
Called and left a detailed message for patient.  

## 2019-05-05 NOTE — Telephone Encounter (Signed)
Patient requesting callback from nurse in regards to her receiving vaccine today

## 2019-05-05 NOTE — Telephone Encounter (Signed)
Patient notified of what Apolonio Schneiders said.

## 2019-05-07 DIAGNOSIS — Z992 Dependence on renal dialysis: Secondary | ICD-10-CM | POA: Diagnosis not present

## 2019-05-07 DIAGNOSIS — N186 End stage renal disease: Secondary | ICD-10-CM | POA: Diagnosis not present

## 2019-05-10 DIAGNOSIS — N186 End stage renal disease: Secondary | ICD-10-CM | POA: Diagnosis not present

## 2019-05-10 DIAGNOSIS — Z992 Dependence on renal dialysis: Secondary | ICD-10-CM | POA: Diagnosis not present

## 2019-05-14 DIAGNOSIS — N186 End stage renal disease: Secondary | ICD-10-CM | POA: Diagnosis not present

## 2019-05-14 DIAGNOSIS — Z992 Dependence on renal dialysis: Secondary | ICD-10-CM | POA: Diagnosis not present

## 2019-05-17 DIAGNOSIS — Z992 Dependence on renal dialysis: Secondary | ICD-10-CM | POA: Diagnosis not present

## 2019-05-17 DIAGNOSIS — N186 End stage renal disease: Secondary | ICD-10-CM | POA: Diagnosis not present

## 2019-05-21 ENCOUNTER — Telehealth: Payer: Self-pay

## 2019-05-21 ENCOUNTER — Ambulatory Visit: Payer: Self-pay | Admitting: General Practice

## 2019-05-21 DIAGNOSIS — Z992 Dependence on renal dialysis: Secondary | ICD-10-CM | POA: Diagnosis not present

## 2019-05-21 DIAGNOSIS — N186 End stage renal disease: Secondary | ICD-10-CM | POA: Diagnosis not present

## 2019-05-21 NOTE — Chronic Care Management (AMB) (Signed)
  Chronic Care Management   Outreach Note  05/21/2019 Name: ELIN FENLEY MRN: [id number] DOB: Jul 06, 1934  Referred by: Guadalupe Maple, MD Reason for referral : Chronic Care Management (Follow up on weight loss/ESRD and other chronic health conditions)   An unsuccessful telephone outreach was attempted today. The patient was referred to the case management team for assistance with care management and care coordination.   Follow Up Plan: A HIPPA compliant phone message was left for the patient providing contact information and requesting a return call.   Noreene Larsson RN, MSN, [facility] Mobile: [phone]

## 2019-05-23 ENCOUNTER — Encounter: Payer: Self-pay | Admitting: Nurse Practitioner

## 2019-05-24 DIAGNOSIS — N186 End stage renal disease: Secondary | ICD-10-CM | POA: Diagnosis not present

## 2019-05-24 DIAGNOSIS — Z992 Dependence on renal dialysis: Secondary | ICD-10-CM | POA: Diagnosis not present

## 2019-05-24 DIAGNOSIS — N2581 Secondary hyperparathyroidism of renal origin: Secondary | ICD-10-CM | POA: Diagnosis not present

## 2019-05-25 ENCOUNTER — Other Ambulatory Visit: Payer: Self-pay

## 2019-05-25 ENCOUNTER — Encounter: Payer: Self-pay | Admitting: Nurse Practitioner

## 2019-05-25 ENCOUNTER — Ambulatory Visit (INDEPENDENT_AMBULATORY_CARE_PROVIDER_SITE_OTHER): Payer: Medicare Other | Admitting: Nurse Practitioner

## 2019-05-25 VITALS — BP 125/72 | HR 68 | Temp 98.5°F | Wt 130.2 lb

## 2019-05-25 DIAGNOSIS — I129 Hypertensive chronic kidney disease with stage 1 through stage 4 chronic kidney disease, or unspecified chronic kidney disease: Secondary | ICD-10-CM | POA: Diagnosis not present

## 2019-05-25 DIAGNOSIS — N186 End stage renal disease: Secondary | ICD-10-CM

## 2019-05-25 DIAGNOSIS — Z7409 Other reduced mobility: Secondary | ICD-10-CM | POA: Insufficient documentation

## 2019-05-25 DIAGNOSIS — G2581 Restless legs syndrome: Secondary | ICD-10-CM | POA: Diagnosis not present

## 2019-05-25 MED ORDER — ROPINIROLE HCL 0.25 MG PO TABS: 0.2500 mg | ORAL_TABLET | Freq: Every day | ORAL | 1 refills | Status: DC

## 2019-05-25 NOTE — Assessment & Plan Note (Signed)
Chronic, ongoing.  No benefit from Gabapentin or Tylenol.  Will trial a low dose, 0.25 MG, Requip.  Script sent.  Educated on this medication and side effects to monitor for.  Return in 6 weeks for follow-up.

## 2019-05-25 NOTE — Progress Notes (Addendum)
BP 125/72   Pulse 68   Temp 98.5 F (36.9 C) (Oral)   Wt 130 lb 3.2 oz (59.1 kg)   SpO2 95%   BMI 23.43 kg/m    Subjective:    Patient ID: Marissa Hess, female    DOB: [birth year], 85 y.o.   MRN: [id number]  HPI: Marissa Hess is a 85 y.o. female  Chief Complaint  Patient presents with  . Follow-up    6 month f/up  . Paperwork    mobility paperwork, states she is wanting to get a 4 wheel power wheelchair, no current or previous pressure sores per patient   HYPERTENSION Continues on Carvedilol and ASA.  Has a hard time getting around on occasion.  She has underlying claudication of BLE, ESRD, and senile osteoporosis.    Has brought paperwork for power wheelchair.  This would enhance her ability to perform ADL's and tasks in house including going to bathroom and cooking meals.  Patient can transfer independently.  A manual w/c would be difficult for her to utilize due to arthritis in her hands and difficulty maneuvering chair, as she enjoys being active to her highest level and this would decrease her current independence + upper body strength 2/5 and would be difficult to move a manual W/C.  She has control of upper body and is able to maneuver a power wheelchair + cognitively understands how to safely use the power wheelchair.  Without a power wheelchair her MRADLs would be significantly affected, as she would be unable to transfer to areas in her home to cook and bath + would not be able to attend provider appointments to assess her overall health.  A scooter would be difficult for her to use in home due to the larger turning radius, a power wheelchair would offer more control and less concern for accidents in home.   Hypertension status: stable  Satisfied with current treatment? yes Duration of hypertension: chronic BP monitoring frequency:  rarely BP range:  BP medication side effects:  no Medication compliance: good compliance Aspirin: yes Recurrent headaches: no Visual  changes: no Palpitations: no Dyspnea: no Chest pain: no Lower extremity edema: no Dizzy/lightheaded: no  CHRONIC KIDNEY DISEASE Followed by nephrology, Dr. Holley Raring.  Is undergoing dialysis under Monday and Friday.  Has labs performed at dialysis center frequently, drew blood yesterday.   CKD status: stable Medications renally dose: yes Previous renal evaluation: yes Pneumovax:  Not up to Date Influenza Vaccine:  Up to Date   RESTLESS LEGS Ongoing issues with this at night, reports being prescribed Gabapentin at last visit, but this did not help.  Discussed Requip at a very low dose and she would like to try this.  Is taking Tylenol, but offers minimal benefit. Duration: chronic Discomfort description:  ill-defined, burning and cramping Pain: yes Location: lower legs Bilateral: yes Symmetric: yes Severity: 6/10 Onset:  gradual Frequency:  intermittent Symptoms only occur while legs at rest: yes Sudden unintentional leg jerking: no Bed partner bothered by leg movements: no LE numbness: no Decreased sensation: no Weakness: no Insomnia: yes with discomfort Daytime somnolence: no Fatigue: no Alleviating factors: nothing Aggravating factors: resting at night Status: fluctuating Treatments attempted: Gabapentin and Tylenol  Relevant past medical, surgical, family and social history reviewed and updated as indicated. Interim medical history since our last visit reviewed. Allergies and medications reviewed and updated.  Review of Systems  Constitutional: Negative for activity change, appetite change, diaphoresis, fatigue and fever.  Respiratory: Negative for  cough, chest tightness and shortness of breath.   Cardiovascular: Negative for chest pain, palpitations and leg swelling.  Gastrointestinal: Negative.   Musculoskeletal: Positive for arthralgias.  Neurological: Negative.   Psychiatric/Behavioral: Negative.     Per HPI unless specifically indicated above       Objective:    BP 125/72   Pulse 68   Temp 98.5 F (36.9 C) (Oral)   Wt 130 lb 3.2 oz (59.1 kg)   SpO2 95%   BMI 23.43 kg/m   Wt Readings from Last 3 Encounters:  05/25/19 130 lb 3.2 oz (59.1 kg)  11/26/18 120 lb (54.4 kg)  10/08/18 122 lb (55.3 kg)    Physical Exam Vitals and nursing note reviewed.  Constitutional:      General: She is awake. She is not in acute distress.    Appearance: She is well-developed and well-groomed. She is not ill-appearing.     Comments: Frail appearing.  HENT:     Head: Normocephalic.     Right Ear: Hearing normal.     Left Ear: Hearing normal.  Eyes:     General: Lids are normal.        Right eye: No discharge.        Left eye: No discharge.     Conjunctiva/sclera: Conjunctivae normal.     Pupils: Pupils are equal, round, and reactive to light.  Neck:     Vascular: No carotid bruit.  Cardiovascular:     Rate and Rhythm: Normal rate and regular rhythm.     Heart sounds: Normal heart sounds. No murmur. No gallop.      Arteriovenous access: right arteriovenous access is present. Pulmonary:     Effort: Pulmonary effort is normal. No accessory muscle usage or respiratory distress.     Breath sounds: Normal breath sounds.  Abdominal:     General: Bowel sounds are normal.     Palpations: Abdomen is soft.  Musculoskeletal:     Cervical back: Normal range of motion and neck supple.     Right lower leg: No edema.     Left lower leg: No edema.     Comments: Strength upper and lower extremities bilaterally 3/5.  Skin:    General: Skin is warm and dry.  Neurological:     Mental Status: She is alert and oriented to person, place, and time.  Psychiatric:        Attention and Perception: Attention normal.        Mood and Affect: Mood normal.        Speech: Speech normal.        Behavior: Behavior normal. Behavior is cooperative.        Thought Content: Thought content normal.        Judgment: Judgment normal.     Results for orders placed  or performed in visit on 11/26/18  Microscopic Examination   URINE  Result Value Ref Range   WBC, UA 6-10 (A) 0 - 5 /hpf   RBC 0-2 0 - 2 /hpf   Epithelial Cells (non renal) 0-10 0 - 10 /hpf   Casts Present None seen /lpf   Cast Type Hyaline casts N/A   Bacteria, UA Few (A) None seen/Few  Urine Culture, Reflex   URINE  Result Value Ref Range   Urine Culture, Routine Final report    Organism ID, Bacteria Comment   CBC with Differential/Platelet  Result Value Ref Range   WBC 5.1 3.4 - 10.8 x10E3/uL   RBC  3.91 3.77 - 5.28 x10E6/uL   Hemoglobin 11.2 11.1 - 15.9 g/dL   Hematocrit 34.4 34.0 - 46.6 %   MCV 88 79 - 97 fL   MCH 28.6 26.6 - 33.0 pg   MCHC 32.6 31.5 - 35.7 g/dL   RDW 14.9 11.7 - 15.4 %   Platelets 207 150 - 450 x10E3/uL   Neutrophils 63 Not Estab. %   Lymphs 21 Not Estab. %   Monocytes 12 Not Estab. %   Eos 3 Not Estab. %   Basos 1 Not Estab. %   Neutrophils Absolute 3.2 1.4 - 7.0 x10E3/uL   Lymphocytes Absolute 1.1 0.7 - 3.1 x10E3/uL   Monocytes Absolute 0.6 0.1 - 0.9 x10E3/uL   EOS (ABSOLUTE) 0.1 0.0 - 0.4 x10E3/uL   Basophils Absolute 0.1 0.0 - 0.2 x10E3/uL   Immature Granulocytes 0 Not Estab. %   Immature Grans (Abs) 0.0 0.0 - 0.1 x10E3/uL  Comprehensive metabolic panel  Result Value Ref Range   Glucose 72 65 - 99 mg/dL   BUN 33 (H) 8 - 27 mg/dL   Creatinine, Ser 2.48 (H) 0.57 - 1.00 mg/dL   GFR calc non Af Amer 18 (L) >59 mL/min/1.73   GFR calc Af Amer 20 (L) >59 mL/min/1.73   BUN/Creatinine Ratio 13 12 - 28   Sodium 140 134 - 144 mmol/L   Potassium 3.7 3.5 - 5.2 mmol/L   Chloride 98 96 - 106 mmol/L   CO2 28 20 - 29 mmol/L   Calcium 8.8 8.7 - 10.3 mg/dL   Total Protein 6.9 6.0 - 8.5 g/dL   Albumin 3.4 (L) 3.6 - 4.6 g/dL   Globulin, Total 3.5 1.5 - 4.5 g/dL   Albumin/Globulin Ratio 1.0 (L) 1.2 - 2.2   Bilirubin Total 0.5 0.0 - 1.2 mg/dL   Alkaline Phosphatase 78 39 - 117 IU/L   AST 11 0 - 40 IU/L   ALT 7 0 - 32 IU/L  Lipid Panel w/o Chol/HDL Ratio    Result Value Ref Range   Cholesterol, Total 187 100 - 199 mg/dL   Triglycerides 110 0 - 149 mg/dL   HDL 51 >39 mg/dL   VLDL Cholesterol Cal 20 5 - 40 mg/dL   LDL Chol Calc (NIH) 116 (H) 0 - 99 mg/dL  TSH  Result Value Ref Range   TSH 3.570 0.450 - 4.500 uIU/mL  UA/M w/rflx Culture, Routine   Specimen: Urine   URINE  Result Value Ref Range   Specific Gravity, UA 1.015 1.005 - 1.030   pH, UA 7.5 5.0 - 7.5   Color, UA Yellow Yellow   Appearance Ur Clear Clear   Leukocytes,UA 1+ (A) Negative   Protein,UA 2+ (A) Negative/Trace   Glucose, UA Negative Negative   Ketones, UA Negative Negative   RBC, UA Trace (A) Negative   Bilirubin, UA Negative Negative   Urobilinogen, Ur 0.2 0.2 - 1.0 mg/dL   Nitrite, UA Negative Negative   Microscopic Examination See below:    Urinalysis Reflex Comment       Assessment & Plan:   Problem List Items Addressed This Visit      Genitourinary   Hypertensive nephropathy    Chronic, ongoing with ESRD.  Continue collaboration with nephrology and current medication regimen. BP at goal for her age today.  Recent labs with dialysis.      ESRD (end stage renal disease) ([place]) - Primary    Chronic, ongoing.  Receives dialysis.  Continue current medication regimen and collaboration  with nephrology team.  Papers for pov filled out.        Other   Restless leg syndrome    Chronic, ongoing.  No benefit from Gabapentin or Tylenol.  Will trial a low dose, 0.25 MG, Requip.  Script sent.  Educated on this medication and side effects to monitor for.  Return in 6 weeks for follow-up.      Poor mobility    Due to ESRD and chronic illnesses.  Paperwork for POV filled out and will have staff fax.          Follow up plan: Return in about 6 weeks (around 07/06/2019) for RLS.

## 2019-05-25 NOTE — Assessment & Plan Note (Signed)
Due to ESRD and chronic illnesses.  Paperwork for POV filled out and will have staff fax.

## 2019-05-25 NOTE — Patient Instructions (Signed)
Ropinirole tablets What is this medicine? ROPINIROLE (roe PIN i role) is used to treat the symptoms of Parkinson's disease. It helps to improve muscle control and movement difficulties. It is also used for the treatment of Restless Legs Syndrome. This medicine may be used for other purposes; ask your health care provider or pharmacist if you have questions. COMMON BRAND NAME(S): Requip What should I tell my health care provider before I take this medicine? They need to know if you have any of these conditions:  heart disease  high blood pressure  kidney disease  liver disease  low blood pressure  narcolepsy  sleep apnea  an unusual or allergic reaction to ropinirole, other medicines, foods, dyes, or preservatives  pregnant or trying to get pregnant  breast-feeding How should I use this medicine? Take this medicine by mouth with a glass of water. Follow the directions on the prescription label. You can take it with or without food. If it upsets your stomach, take it with food. Take your doses at regular intervals. Do not take your medicine more often than directed. Do not stop taking this medicine except on your doctor's advice. Stopping this medicine too quickly may cause serious side effects. Talk to your pediatrician regarding the use of this medicine in children. Special care may be needed. Overdosage: If you think you have taken too much of this medicine contact a poison control center or emergency room at once. NOTE: This medicine is only for you. Do not share this medicine with others. What if I miss a dose? If you miss a dose, take it as soon as you can. If it is almost time for your next dose, take only that dose. Do not take double or extra doses. What may interact with this medicine?  certain medicines for depression, mood, or psychotic disorders  ciprofloxacin  female hormones, like estrogens and birth control  pills  fluvoxamine  metoclopramide  mexiletine  norfloxacin  omeprazole  rifampin This list may not describe all possible interactions. Give your health care provider a list of all the medicines, herbs, non-prescription drugs, or dietary supplements you use. Also tell them if you smoke, drink alcohol, or use illegal drugs. Some items may interact with your medicine. What should I watch for while using this medicine? Visit your health care professional for regular checks on your progress. Tell your health care professional if your symptoms do not start to get better or if they get worse. Do not stop taking except on your health care professional's advice. You may develop a severe reaction. Your health care professional will tell you how much medicine to take. You may get drowsy or dizzy. Do not drive, use machinery, or do anything that needs mental alertness until you know how this drug affects you. Do not stand or sit up quickly, especially if you are an older patient. This reduces the risk of dizzy or fainting spells. Alcohol may interfere with the effect of this medicine. Avoid alcoholic drinks. When taking this medicine, you may fall asleep without notice. You may be doing activities like driving a car, talking, or eating. You may not feel drowsy before it happens. Contact your health care provider right away if this happens to you. There have been reports of increased sexual urges or other strong urges such as gambling while taking this medicine. If you experience any of these while taking this medicine, you should report this to your health care provider as soon as possible. Your mouth may  get dry. Chewing sugarless gum or sucking hard candy and drinking plenty of water may help. Contact your health care professional if the problem does not go away or is severe. You should check your skin often for changes to moles and new growths while taking this medicine. Call your doctor if you notice  any of these changes. What side effects may I notice from receiving this medicine? Side effects that you should report to your doctor or health care professional as soon as possible:  allergic reactions like skin rash, itching or hives, swelling of the face, lips, or tongue  breathing problems  changes in emotions or moods  changes in vision  chest pain  confusion  falling asleep during normal activities like driving  fast, irregular heartbeat  hallucinations  joint or muscle pain  loss of bladder control  loss of memory  new or increased gambling urges, sexual urges, uncontrolled spending, binge or compulsive eating, or other urges  pain, tingling, numbness in the hands or feet  signs and symptoms of low blood pressure like dizziness; feeling faint or lightheaded, falls; unusually weak or tired  swelling of the ankles, feet, hands  uncontrollable movements of the arms, face, head, mouth, neck, or upper body  vomiting Side effects that usually do not require medical attention (report to your doctor or health care professional if they continue or are bothersome):  dizziness  drowsiness  headache  increased sweating  nausea This list may not describe all possible side effects. Call your doctor for medical advice about side effects. You may report side effects to FDA at 1-800-FDA-1088. Where should I keep my medicine? Keep out of the reach of children. Store at room temperature between 20 and 25 degrees C (68 and 77 degrees F). Protect from light and moisture. Keep container tightly closed. Throw away any unused medicine after the expiration date. NOTE: This sheet is a summary. It may not cover all possible information. If you have questions about this medicine, talk to your doctor, pharmacist, or health care provider.  2020 Elsevier/Gold Standard (2018-11-05 16:52:05)

## 2019-05-25 NOTE — Assessment & Plan Note (Signed)
Chronic, ongoing.  Receives dialysis.  Continue current medication regimen and collaboration with nephrology team.  Papers for pov filled out.

## 2019-05-25 NOTE — Assessment & Plan Note (Addendum)
Chronic, ongoing with ESRD.  Continue collaboration with nephrology and current medication regimen. BP at goal for her age today.  Recent labs with dialysis.

## 2019-05-28 DIAGNOSIS — N186 End stage renal disease: Secondary | ICD-10-CM | POA: Diagnosis not present

## 2019-05-28 DIAGNOSIS — Z992 Dependence on renal dialysis: Secondary | ICD-10-CM | POA: Diagnosis not present

## 2019-05-31 DIAGNOSIS — Z992 Dependence on renal dialysis: Secondary | ICD-10-CM | POA: Diagnosis not present

## 2019-05-31 DIAGNOSIS — N186 End stage renal disease: Secondary | ICD-10-CM | POA: Diagnosis not present

## 2019-06-04 DIAGNOSIS — Z992 Dependence on renal dialysis: Secondary | ICD-10-CM | POA: Diagnosis not present

## 2019-06-04 DIAGNOSIS — N186 End stage renal disease: Secondary | ICD-10-CM | POA: Diagnosis not present

## 2019-06-07 DIAGNOSIS — Z992 Dependence on renal dialysis: Secondary | ICD-10-CM | POA: Diagnosis not present

## 2019-06-07 DIAGNOSIS — N186 End stage renal disease: Secondary | ICD-10-CM | POA: Diagnosis not present

## 2019-06-11 DIAGNOSIS — N186 End stage renal disease: Secondary | ICD-10-CM | POA: Diagnosis not present

## 2019-06-11 DIAGNOSIS — Z992 Dependence on renal dialysis: Secondary | ICD-10-CM | POA: Diagnosis not present

## 2019-06-14 DIAGNOSIS — Z992 Dependence on renal dialysis: Secondary | ICD-10-CM | POA: Diagnosis not present

## 2019-06-14 DIAGNOSIS — N186 End stage renal disease: Secondary | ICD-10-CM | POA: Diagnosis not present

## 2019-06-15 ENCOUNTER — Ambulatory Visit (INDEPENDENT_AMBULATORY_CARE_PROVIDER_SITE_OTHER): Payer: Medicare Other | Admitting: Pharmacist

## 2019-06-15 DIAGNOSIS — G2581 Restless legs syndrome: Secondary | ICD-10-CM

## 2019-06-15 DIAGNOSIS — I129 Hypertensive chronic kidney disease with stage 1 through stage 4 chronic kidney disease, or unspecified chronic kidney disease: Secondary | ICD-10-CM | POA: Diagnosis not present

## 2019-06-15 DIAGNOSIS — J301 Allergic rhinitis due to pollen: Secondary | ICD-10-CM

## 2019-06-15 NOTE — Chronic Care Management (AMB) (Signed)
Chronic Care Management   Follow Up Note   06/15/2019 Name: DALA BREAULT MRN: [id number] DOB: April 21, 1934  Referred by: Venita Lick, NP Reason for referral : Chronic Care Management (Medication Management)   JOELLA SAEFONG is a 85 y.o. year old female who is a primary care patient of Cannady, Barbaraann Faster, NP. The CCM team was consulted for assistance with chronic disease management and care coordination needs.    Contacted patient for medication management review.   Review of patient status, including review of consultants reports, relevant laboratory and other test results, and collaboration with appropriate care team members and the patient's provider was performed as part of comprehensive patient evaluation and provision of chronic care management services.    SDOH (Social Determinants of Health) assessments performed: Yes See Care Plan activities for detailed interventions related to [facility])     Outpatient Encounter Medications as of 06/15/2019  Medication Sig  . aspirin EC 81 MG tablet Take 81 mg by mouth daily.  Marland Kitchen azelastine (ASTELIN) 0.1 % nasal spray Place into both nostrils 2 (two) times daily. Place 1 spray into both nostril 2 (two) times daily. Use in each nostril as directed. Starting Thu 10/08/2018  . carvedilol (COREG) 25 MG tablet Take 1 tablet (25 mg total) by mouth 2 (two) times daily with a meal.  . cholecalciferol (VITAMIN D) 1000 units tablet Take 1,000 Units by mouth daily.   . ferrous sulfate (SLOW RELEASE IRON) 160 (50 Fe) MG TBCR SR tablet Take 1 tablet by mouth daily.   . folic acid (FOLVITE) 1 MG tablet Take 1 tablet (1 mg total) by mouth daily.  Marland Kitchen rOPINIRole (REQUIP) 0.25 MG tablet Take 1 tablet (0.25 mg total) by mouth at bedtime.  . cetirizine (ZYRTEC) 10 MG tablet Take 1 tablet (10 mg total) by mouth daily. (Patient not taking: Reported on 06/15/2019)  . traMADol (ULTRAM) 50 MG tablet Take 1 tablet (50 mg total) by mouth daily as needed. (Patient not  taking: Reported on 05/25/2019)   No facility-administered encounter medications on file as of 06/15/2019.     Objective:   Goals Addressed            This Visit's Progress     Patient Stated   . PharmD "Are my medications making me lose weight?" (pt-stated)       Current Barriers:  . Polypharmacy; complex patient with multiple comorbidities including ESRD on HD Monday and Friday - cut out Wednesday around January.  . Notes she had her last COVID vaccine Sat . Awaiting processing of paperwork to get power wheelchair. She notes that she has not heard anything about this since her appointment w/ PCP. Appears other paperwork was sent from the company to our office since then. . Self-manages medications.  o Allergies: azelastine nasal spray daily; notes she has not been taking cetirizine o HTN: carvedilol 25 mg BID o Anemia in CKD: ferrous sulfate daily, folic acid 1 mg daily o Health maintenance: aspirin 81 mg daily, vitamin D 1000 units daily o Restless leg syndrome: just started ropinirole 0.25 mg QPM, notes this has been helping. Notes that it does not make her as sleepy/groggy and is more beneficial than tramadol and gabapentin did  Pharmacist Clinical Goal(s):  Marland Kitchen Over the next 90 days, patient will work with PharmD and provider towards optimized medication management  Interventions: . Comprehensive medication review performed; medication list updated in electronic medical record . Reviewed indications for each medication. She asks about the  use of ropinirole in Parkinson's, as the medication insert noted this. Explained that many medications are used for alternative indications than what they were originally approved for. She verbalized understanding.  . Will communicate w/ RN CM about patient's mobility concerns.   Patient Self Care Activities:  . Patient will take medications as prescribed  Please see past updates related to this goal by clicking on the "Past Updates" button in  the selected goal          Plan:  - Patient denies any other questions or concerns. She has my contact information for future medication needs.   Catie Darnelle Maffucci, PharmD, [facility] [phone]

## 2019-06-15 NOTE — Patient Instructions (Addendum)
Visit Information  Goals Addressed            This Visit's Progress     Patient Stated   . PharmD "Are my medications making me lose weight?" (pt-stated)       Current Barriers:  . Polypharmacy; complex patient with multiple comorbidities including ESRD on HD Monday and Friday - cut out Wednesday around January.  . Notes she had her last COVID vaccine Sat . Awaiting processing of paperwork to get power wheelchair. She notes that she has not heard anything about this since her appointment w/ PCP. Appears other paperwork was sent from the company to our office since then. . Self-manages medications.  o Allergies: azelastine nasal spray daily; notes she has not been taking cetirizine o HTN: carvedilol 25 mg BID o Anemia in CKD: ferrous sulfate daily, folic acid 1 mg daily o Health maintenance: aspirin 81 mg daily, vitamin D 1000 units daily o Restless leg syndrome: just started ropinirole 0.25 mg QPM, notes this has been helping. Notes that it does not make her as sleepy/groggy and is more beneficial than tramadol and gabapentin did  Pharmacist Clinical Goal(s):  Marland Kitchen Over the next 90 days, patient will work with PharmD and provider towards optimized medication management  Interventions: . Comprehensive medication review performed; medication list updated in electronic medical record . Reviewed indications for each medication. She asks about the use of ropinirole in Parkinson's, as the medication insert noted this. Explained that many medications are used for alternative indications than what they were originally approved for. She verbalized understanding.  . Will communicate w/ RN CM about patient's mobility concerns.   Patient Self Care Activities:  . Patient will take medications as prescribed  Please see past updates related to this goal by clicking on the "Past Updates" button in the selected goal         Patient verbalizes understanding of instructions provided today.     Plan:  - Patient denies any other questions or concerns. She has my contact information for future medication needs.   Catie Darnelle Maffucci, PharmD, [place] [phone]

## 2019-06-16 DIAGNOSIS — Z992 Dependence on renal dialysis: Secondary | ICD-10-CM | POA: Diagnosis not present

## 2019-06-16 DIAGNOSIS — N186 End stage renal disease: Secondary | ICD-10-CM | POA: Diagnosis not present

## 2019-06-18 DIAGNOSIS — N186 End stage renal disease: Secondary | ICD-10-CM | POA: Diagnosis not present

## 2019-06-18 DIAGNOSIS — Z992 Dependence on renal dialysis: Secondary | ICD-10-CM | POA: Diagnosis not present

## 2019-06-21 DIAGNOSIS — Z992 Dependence on renal dialysis: Secondary | ICD-10-CM | POA: Diagnosis not present

## 2019-06-21 DIAGNOSIS — N186 End stage renal disease: Secondary | ICD-10-CM | POA: Diagnosis not present

## 2019-06-24 ENCOUNTER — Telehealth: Payer: Self-pay | Admitting: Nurse Practitioner

## 2019-06-24 NOTE — Telephone Encounter (Signed)
Please check on wheelchair.  I have filled out paperwork twice and I know we sent papers and note.  Thank you.:)

## 2019-06-24 NOTE — Telephone Encounter (Signed)
Copied from [place] [id number]. Topic: General - Inquiry >> Jun 24, 2019  2:10 PM Alease Frame wrote: Reason for CRM: Santiago Glad from [employer] has sent over several request for pts wheel chair and has not gotten a response from office please advise   Call back [phone] Ext [phone] >> Jun 24, 2019  2:22 PM Izola Price, [place] A wrote: Patient called and is requesting a callback in regards to a status update on her motor chair

## 2019-06-25 DIAGNOSIS — Z992 Dependence on renal dialysis: Secondary | ICD-10-CM | POA: Diagnosis not present

## 2019-06-25 DIAGNOSIS — N186 End stage renal disease: Secondary | ICD-10-CM | POA: Diagnosis not present

## 2019-06-25 NOTE — Telephone Encounter (Signed)
Marissa Hess stated that she did not get the forms we faxed previously. Re-faxed the corrected form and OV note to Marissa Hess.  Called and let patient know that this was done for her.

## 2019-06-28 DIAGNOSIS — Z992 Dependence on renal dialysis: Secondary | ICD-10-CM | POA: Diagnosis not present

## 2019-06-28 DIAGNOSIS — N186 End stage renal disease: Secondary | ICD-10-CM | POA: Diagnosis not present

## 2019-06-28 DIAGNOSIS — N2581 Secondary hyperparathyroidism of renal origin: Secondary | ICD-10-CM | POA: Diagnosis not present

## 2019-06-28 DIAGNOSIS — D509 Iron deficiency anemia, unspecified: Secondary | ICD-10-CM | POA: Diagnosis not present

## 2019-07-02 DIAGNOSIS — Z992 Dependence on renal dialysis: Secondary | ICD-10-CM | POA: Diagnosis not present

## 2019-07-02 DIAGNOSIS — N186 End stage renal disease: Secondary | ICD-10-CM | POA: Diagnosis not present

## 2019-07-05 DIAGNOSIS — N186 End stage renal disease: Secondary | ICD-10-CM | POA: Diagnosis not present

## 2019-07-05 DIAGNOSIS — Z992 Dependence on renal dialysis: Secondary | ICD-10-CM | POA: Diagnosis not present

## 2019-07-06 ENCOUNTER — Encounter: Payer: Self-pay | Admitting: Nurse Practitioner

## 2019-07-06 ENCOUNTER — Ambulatory Visit (INDEPENDENT_AMBULATORY_CARE_PROVIDER_SITE_OTHER): Payer: Medicare Other | Admitting: Nurse Practitioner

## 2019-07-06 ENCOUNTER — Telehealth: Payer: Self-pay | Admitting: General Practice

## 2019-07-06 ENCOUNTER — Other Ambulatory Visit: Payer: Self-pay

## 2019-07-06 ENCOUNTER — Ambulatory Visit (INDEPENDENT_AMBULATORY_CARE_PROVIDER_SITE_OTHER): Payer: Medicare Other | Admitting: General Practice

## 2019-07-06 VITALS — BP 127/81 | HR 96 | Temp 97.7°F | Wt 129.0 lb

## 2019-07-06 DIAGNOSIS — G2581 Restless legs syndrome: Secondary | ICD-10-CM

## 2019-07-06 DIAGNOSIS — E785 Hyperlipidemia, unspecified: Secondary | ICD-10-CM | POA: Diagnosis not present

## 2019-07-06 DIAGNOSIS — N186 End stage renal disease: Secondary | ICD-10-CM

## 2019-07-06 DIAGNOSIS — F419 Anxiety disorder, unspecified: Secondary | ICD-10-CM

## 2019-07-06 MED ORDER — ROPINIROLE HCL 0.25 MG PO TABS: 0.2500 mg | ORAL_TABLET | Freq: Two times a day (BID) | ORAL | 1 refills | Status: DC

## 2019-07-06 NOTE — Progress Notes (Signed)
BP 127/81   Pulse 96   Temp 97.7 F (36.5 C) (Oral)   Wt 129 lb (58.5 kg)   SpO2 100%   BMI 23.22 kg/m    Subjective:    Patient ID: Marissa Hess, female    DOB: 03/04/1935, 85 y.o.   MRN: [id number]  HPI: Marissa Hess is a 85 y.o. female  Chief Complaint  Patient presents with  . RLS   RESTLESS LEGS Started on low dose Requip last visit.  Has tried Gabapentin and Tylenol in past with no benefit.  Has ESRD and limited on treatment options due to this.  She reports it is helping at night, but sometimes when she is getting dialysis.  She denies any ADR with Requip and is tolerating well. Duration: chronic Discomfort description:  ill-defined Pain: no Location: lower legs Bilateral: yes Symmetric: yes Frequency:  intermittent Symptoms only occur while legs at rest: yes Sudden unintentional leg jerking: no Bed partner bothered by leg movements: no LE numbness: no Decreased sensation: no Weakness: no Insomnia: no Daytime somnolence: no Fatigue: no Alleviating factors: Requip Aggravating factors: unknown Status: better at night, needs a little more for daytime with dialysis Treatments attempted: Gabapentin and Tylenol  Relevant past medical, surgical, family and social history reviewed and updated as indicated. Interim medical history since our last visit reviewed. Allergies and medications reviewed and updated.  Review of Systems  Constitutional: Negative for activity change, appetite change, diaphoresis, fatigue and fever.  Respiratory: Negative for cough, chest tightness and shortness of breath.   Cardiovascular: Negative for chest pain, palpitations and leg swelling.  Gastrointestinal: Negative.   Musculoskeletal: Positive for arthralgias.  Neurological: Negative.   Psychiatric/Behavioral: Negative.     Per HPI unless specifically indicated above     Objective:    BP 127/81   Pulse 96   Temp 97.7 F (36.5 C) (Oral)   Wt 129 lb (58.5 kg)   SpO2  100%   BMI 23.22 kg/m   Wt Readings from Last 3 Encounters:  07/06/19 129 lb (58.5 kg)  05/25/19 130 lb 3.2 oz (59.1 kg)  11/26/18 120 lb (54.4 kg)    Physical Exam Vitals and nursing note reviewed.  Constitutional:      General: She is awake. She is not in acute distress.    Appearance: She is well-developed and well-groomed. She is not ill-appearing.     Comments: Frail appearing.  HENT:     Head: Normocephalic.     Right Ear: Hearing normal.     Left Ear: Hearing normal.  Eyes:     General: Lids are normal.        Right eye: No discharge.        Left eye: No discharge.     Conjunctiva/sclera: Conjunctivae normal.     Pupils: Pupils are equal, round, and reactive to light.  Neck:     Vascular: No carotid bruit.  Cardiovascular:     Rate and Rhythm: Normal rate and regular rhythm.     Heart sounds: Normal heart sounds. No murmur. No gallop.      Arteriovenous access: right arteriovenous access is present. Pulmonary:     Effort: Pulmonary effort is normal. No accessory muscle usage or respiratory distress.     Breath sounds: Normal breath sounds.  Abdominal:     General: Bowel sounds are normal.     Palpations: Abdomen is soft.  Musculoskeletal:     Cervical back: Normal range of motion and neck  supple.     Right lower leg: No edema.     Left lower leg: No edema.     Comments: Strength upper and lower extremities bilaterally 3/5.  Skin:    General: Skin is warm and dry.  Neurological:     Mental Status: She is alert and oriented to person, place, and time.  Psychiatric:        Attention and Perception: Attention normal.        Mood and Affect: Mood normal.        Speech: Speech normal.        Behavior: Behavior normal. Behavior is cooperative.        Thought Content: Thought content normal.     Results for orders placed or performed in visit on 11/26/18  Microscopic Examination   URINE  Result Value Ref Range   WBC, UA 6-10 (A) 0 - 5 /hpf   RBC 0-2 0 - 2  /hpf   Epithelial Cells (non renal) 0-10 0 - 10 /hpf   Casts Present None seen /lpf   Cast Type Hyaline casts N/A   Bacteria, UA Few (A) None seen/Few  Urine Culture, Reflex   URINE  Result Value Ref Range   Urine Culture, Routine Final report    Organism ID, Bacteria Comment   CBC with Differential/Platelet  Result Value Ref Range   WBC 5.1 3.4 - 10.8 x10E3/uL   RBC 3.91 3.77 - 5.28 x10E6/uL   Hemoglobin 11.2 11.1 - 15.9 g/dL   Hematocrit 34.4 34.0 - 46.6 %   MCV 88 79 - 97 fL   MCH 28.6 26.6 - 33.0 pg   MCHC 32.6 31.5 - 35.7 g/dL   RDW 14.9 11.7 - 15.4 %   Platelets 207 150 - 450 x10E3/uL   Neutrophils 63 Not Estab. %   Lymphs 21 Not Estab. %   Monocytes 12 Not Estab. %   Eos 3 Not Estab. %   Basos 1 Not Estab. %   Neutrophils Absolute 3.2 1.4 - 7.0 x10E3/uL   Lymphocytes Absolute 1.1 0.7 - 3.1 x10E3/uL   Monocytes Absolute 0.6 0.1 - 0.9 x10E3/uL   EOS (ABSOLUTE) 0.1 0.0 - 0.4 x10E3/uL   Basophils Absolute 0.1 0.0 - 0.2 x10E3/uL   Immature Granulocytes 0 Not Estab. %   Immature Grans (Abs) 0.0 0.0 - 0.1 x10E3/uL  Comprehensive metabolic panel  Result Value Ref Range   Glucose 72 65 - 99 mg/dL   BUN 33 (H) 8 - 27 mg/dL   Creatinine, Ser 2.48 (H) 0.57 - 1.00 mg/dL   GFR calc non Af Amer 18 (L) >59 mL/min/1.73   GFR calc Af Amer 20 (L) >59 mL/min/1.73   BUN/Creatinine Ratio 13 12 - 28   Sodium 140 134 - 144 mmol/L   Potassium 3.7 3.5 - 5.2 mmol/L   Chloride 98 96 - 106 mmol/L   CO2 28 20 - 29 mmol/L   Calcium 8.8 8.7 - 10.3 mg/dL   Total Protein 6.9 6.0 - 8.5 g/dL   Albumin 3.4 (L) 3.6 - 4.6 g/dL   Globulin, Total 3.5 1.5 - 4.5 g/dL   Albumin/Globulin Ratio 1.0 (L) 1.2 - 2.2   Bilirubin Total 0.5 0.0 - 1.2 mg/dL   Alkaline Phosphatase 78 39 - 117 IU/L   AST 11 0 - 40 IU/L   ALT 7 0 - 32 IU/L  Lipid Panel w/o Chol/HDL Ratio  Result Value Ref Range   Cholesterol, Total 187 100 - 199 mg/dL  Triglycerides 110 0 - 149 mg/dL   HDL 51 >39 mg/dL   VLDL Cholesterol  Cal 20 5 - 40 mg/dL   LDL Chol Calc (NIH) 116 (H) 0 - 99 mg/dL  TSH  Result Value Ref Range   TSH 3.570 0.450 - 4.500 uIU/mL  UA/M w/rflx Culture, Routine   Specimen: Urine   URINE  Result Value Ref Range   Specific Gravity, UA 1.015 1.005 - 1.030   pH, UA 7.5 5.0 - 7.5   Color, UA Yellow Yellow   Appearance Ur Clear Clear   Leukocytes,UA 1+ (A) Negative   Protein,UA 2+ (A) Negative/Trace   Glucose, UA Negative Negative   Ketones, UA Negative Negative   RBC, UA Trace (A) Negative   Bilirubin, UA Negative Negative   Urobilinogen, Ur 0.2 0.2 - 1.0 mg/dL   Nitrite, UA Negative Negative   Microscopic Examination See below:    Urinalysis Reflex Comment       Assessment & Plan:   Problem List Items Addressed This Visit      Other   Restless leg syndrome - Primary    Chronic, ongoing.  No benefit from Gabapentin or Tylenol. Having benefit at night with Requip, but occasional discomfort during daytime at dialysis. Will increase Requip to 0.25 MG BID.  Script sent.  Educated on this medication and side effects to monitor for.  Return in 4 weeks for follow-up.          Follow up plan: Return in about 4 weeks (around 08/03/2019) for RLS follow-up.

## 2019-07-06 NOTE — Patient Instructions (Signed)

## 2019-07-06 NOTE — Assessment & Plan Note (Signed)
Chronic, ongoing.  No benefit from Gabapentin or Tylenol. Having benefit at night with Requip, but occasional discomfort during daytime at dialysis. Will increase Requip to 0.25 MG BID.  Script sent.  Educated on this medication and side effects to monitor for.  Return in 4 weeks for follow-up.

## 2019-07-06 NOTE — Chronic Care Management (AMB) (Signed)
Chronic Care Management   Follow Up Note   07/06/2019 Name: Marissa Hess MRN: [id number] DOB: May 01, 1934  Referred by: Venita Lick, NP Reason for referral : Chronic Care Management (Face to face visit with pcp and patient: RNCM [facility] Disease Management and care coordination Needs)   Marissa Hess is a 85 y.o. year old female who is a primary care patient of Cannady, Barbaraann Faster, NP. The CCM team was consulted for assistance with chronic disease management and care coordination needs.    Review of patient status, including review of consultants reports, relevant laboratory and other test results, and collaboration with appropriate care team members and the patient's provider was performed as part of comprehensive patient evaluation and provision of chronic care management services.    SDOH (Social Determinants of Health) assessments performed: Yes See Care Plan activities for detailed interventions related to [facility])     Outpatient Encounter Medications as of 07/06/2019  Medication Sig   aspirin EC 81 MG tablet Take 81 mg by mouth daily.   azelastine (ASTELIN) 0.1 % nasal spray Place into both nostrils 2 (two) times daily. Place 1 spray into both nostril 2 (two) times daily. Use in each nostril as directed. Starting Thu 10/08/2018   carvedilol (COREG) 25 MG tablet Take 1 tablet (25 mg total) by mouth 2 (two) times daily with a meal.   cholecalciferol (VITAMIN D) 1000 units tablet Take 1,000 Units by mouth daily.    ferrous sulfate (SLOW RELEASE IRON) 160 (50 Fe) MG TBCR SR tablet Take 1 tablet by mouth daily.    folic acid (FOLVITE) 1 MG tablet Take 1 tablet (1 mg total) by mouth daily.   rOPINIRole (REQUIP) 0.25 MG tablet Take 1 tablet (0.25 mg total) by mouth 2 (two) times daily.   [DISCONTINUED] cetirizine (ZYRTEC) 10 MG tablet Take 1 tablet (10 mg total) by mouth daily. (Patient not taking: Reported on 06/15/2019)   [DISCONTINUED] rOPINIRole (REQUIP) 0.25 MG tablet Take  1 tablet (0.25 mg total) by mouth at bedtime.   [DISCONTINUED] traMADol (ULTRAM) 50 MG tablet Take 1 tablet (50 mg total) by mouth daily as needed. (Patient not taking: Reported on 05/25/2019)   No facility-administered encounter medications on file as of 07/06/2019.     Objective:   BP Readings from Last 3 Encounters:  07/06/19 127/81  05/25/19 125/72  11/26/18 124/78   Wt Readings from Last 3 Encounters:  07/06/19 129 lb (58.5 kg)  05/25/19 130 lb 3.2 oz (59.1 kg)  11/26/18 120 lb (54.4 kg)   Goals Addressed            This Visit's Progress    COMPLETED: RN-I am worried about how much weight I have lost (pt-stated)       Current Barriers:   Knowledge Deficits related to recent weight loss  Nurse Case Manager Clinical Goal(s):   Over the next 90 days, patient will work with [facility] to address needs related to reported sudden weight loss  Interventions:   Nepro samples received from [facility] as requested for patient related to ESRD.   Called patient to make her aware of samples and talk with her about how things were going, and ask if she had received mailed education, but patient did not answer.   Collaborated with PCP office staff regarding special Nutrition supplement Nepro, ordered for patient. Patient has an upcoming appointment 9/10 they have made a note to give patient the ordered supplement.   Patient Self Care Activities:  Currently UNABLE TO independently identify reason for unintentional weight loss  Calls provider office for new concerns or questions  Please see past updates related to this goal by clicking on the "Past Updates" button in the selected goal       RNCM: "I get tired really easy" (pt-stated)       [place] (see longtitudinal plan of care for additional care plan information)  Current Barriers:   Chronic Disease Management support, education, and care coordination needs related to HLD, ESRD, Anxiety, and RLS  Clinical Goal(s)  related to HLD, ESRD, Anxiety, and RLS :  Over the next 120 days, patient will:   Work with the care management team to address educational, disease management, and care coordination needs   Begin or continue self health monitoring activities as directed today  adhere to heart healthy/renal diet  Call provider office for new or worsened signs and symptoms Shortness of breath and New or worsened symptom related to RLS/HLD/Anxiety/ESRD and other chronic conditions  Call care management team with questions or concerns  Verbalize basic understanding of patient centered plan of care established today  Interventions related to HLD, ESRD, Anxiety, and RLS :   Evaluation of current treatment plans and patient's adherence to plan as established by provider.  The patient was in the office today for follow up related to treatment of RLS. Medication changes today. The patient will follow up with the pcp in 4 weeks to see the effectiveness of the medication changes for RLS  Assessed patient understanding of disease states.  The patient has a good understanding of her chronic conditions. Is taking dialysis on Monday's and Fridays now.   Assessed patient's education and care coordination needs.  The patient states she gets tired easily and can not clean her bathroom. Her husband helps her with the rest of the house but she needs assistance with her bathroom. Care guide referral for resources to help with cleaning.   Provided disease specific education to patient.  The patient is compliant with dietary restrictions, medications and following up with providers in a timely manner. Will continue to monitor for new concerns and needs.  Collaborated with appropriate clinical care team members regarding patient needs  Care guide referral for resources in [facility] for help in the home one day a week. The patient is aware that this may be an out of pocket expense and not covered by the insurance.   Patient  Self Care Activities related to HLD, ESRD, Anxiety, and RLS :   Patient is unable to independently self-manage chronic health conditions  Initial goal documentation      RNCM: "I need to find out the status of my chair" (pt-stated)       [place] (see longitudinal plan of care for additional care plan information)  Current Barriers:   Knowledge Deficits related to how to obtain needed DME- electric scooter  Care Coordination needs related to decreased mobility in a patient with ESRD on dialysis, RLS, HLD, and anxiety (disease states)  Nurse Case Manager Clinical Goal(s):   Over the next 120 days, patient will verbalize understanding of plan for help with obtaining information on the status of needed DME  Over the next 120 days, patient will work with [facility] and PCP to address needs related to electric scooter  Over the next 120 days, patient will attend all scheduled medical appointments: pcp visit today, then 4 week follow up  Over the next 120 days, patient will  demonstrate improved health management independence as evidenced by remaining independent and having needed DME to remain independent in her home and community environment   Over the next 120 days, the patient will demonstrate ongoing self health care management ability as evidenced by increased mobility and receiving needed scooter for increased mobility needs.   Interventions:   Inter-disciplinary care team collaboration (see longitudinal plan of care)  Evaluation of current treatment plan related to chronic conditions and mobility needs and patient's adherence to plan as established by provider.  Advised patient to call the [facility] for any concerns related to the status of DME  Provided education to patient re: calling the DME company and talking to the staff concerning the patients electric scooter.  Call made to [employer] and talked with Otila Back at [phone] option [phone] and ext [phone].  Per Santiago Glad  she received approval from [facility] today and is going to call the patient this afternoon and let her know she has been approved and arrange insurance and delivery.   Collaborated with pcp and supply company  regarding order for DME and if they need additional information before processing DME.  Per Santiago Glad at [employer] no additional information required at this time to process the order.   Discussed plans with patient for ongoing care management follow up and provided patient with direct contact information for care management team  Reviewed scheduled/upcoming provider appointments including: completed pcp appointment today and will follow up in 4 weeks with pcp after medication changes.   Patient Self Care Activities:   Patient verbalizes understanding of plan to receive information about electric scooter once the RNCM contacts the company to find out the status of the DME  Performs IADL's independently  Calls provider office for new concerns or questions  Unable to independently obtain needed DME   Initial goal documentation         Plan:   The care management team will reach out to the patient again over the next 30 to 60  days.    Noreene Larsson RN, MSN, [facility] Mobile: [phone]

## 2019-07-06 NOTE — Patient Instructions (Signed)
Visit Information  Goals Addressed            This Visit's Progress   . COMPLETED: RN-I am worried about how much weight I have lost (pt-stated)       Current Barriers:  Marland Kitchen Knowledge Deficits related to recent weight loss  Nurse Case Manager Clinical Goal(s):  Marland Kitchen Over the next 90 days, patient will work with [facility] to address needs related to reported sudden weight loss  Interventions:  . Nepro samples received from [facility] Rep as requested for patient related to ESRD.  . Called patient to make her aware of samples and talk with her about how things were going, and ask if she had received mailed education, but patient did not answer.  Nash Dimmer with PCP office staff regarding special Nutrition supplement Nepro, ordered for patient. Patient has an upcoming appointment 9/10 they have made a note to give patient the ordered supplement.   Patient Self Care Activities:  . Currently UNABLE TO independently identify reason for unintentional weight loss . Calls provider office for new concerns or questions  Please see past updates related to this goal by clicking on the "Past Updates" button in the selected goal      . RNCM: "I get tired really easy" (pt-stated)       [place] (see longtitudinal plan of care for additional care plan information)  Current Barriers:  . Chronic Disease Management support, education, and care coordination needs related to HLD, ESRD, Anxiety, and RLS  Clinical Goal(s) related to HLD, ESRD, Anxiety, and RLS :  Over the next 120 days, patient will:  . Work with the care management team to address educational, disease management, and care coordination needs  . Begin or continue self health monitoring activities as directed today  adhere to heart healthy/renal diet . Call provider office for new or worsened signs and symptoms Shortness of breath and New or worsened symptom related to RLS/HLD/Anxiety/ESRD and other chronic conditions . Call care management  team with questions or concerns . Verbalize basic understanding of patient centered plan of care established today  Interventions related to HLD, ESRD, Anxiety, and RLS :  . Evaluation of current treatment plans and patient's adherence to plan as established by provider.  The patient was in the office today for follow up related to treatment of RLS. Medication changes today. The patient will follow up with the pcp in 4 weeks to see the effectiveness of the medication changes for RLS . Assessed patient understanding of disease states.  The patient has a good understanding of her chronic conditions. Is taking dialysis on Monday's and Fridays now.  . Assessed patient's education and care coordination needs.  The patient states she gets tired easily and can not clean her bathroom. Her husband helps her with the rest of the house but she needs assistance with her bathroom. Care guide referral for resources to help with cleaning.  . Provided disease specific education to patient.  The patient is compliant with dietary restrictions, medications and following up with providers in a timely manner. Will continue to monitor for new concerns and needs. Nash Dimmer with appropriate clinical care team members regarding patient needs . Care guide referral for resources in [facility] for help in the home one day a week. The patient is aware that this may be an out of pocket expense and not covered by the insurance.   Patient Self Care Activities related to HLD, ESRD, Anxiety, and RLS :  .  Patient is unable to independently self-manage chronic health conditions  Initial goal documentation     . RNCM: "I need to find out the status of my chair" (pt-stated)       [place] (see longitudinal plan of care for additional care plan information)  Current Barriers:  Marland Kitchen Knowledge Deficits related to how to obtain needed DME- electric scooter . Care Coordination needs related to decreased mobility in a  patient with ESRD on dialysis, RLS, HLD, and anxiety (disease states)  Nurse Case Manager Clinical Goal(s):  Marland Kitchen Over the next 120 days, patient will verbalize understanding of plan for help with obtaining information on the status of needed DME . Over the next 120 days, patient will work with [facility] and PCP to address needs related to electric scooter . Over the next 120 days, patient will attend all scheduled medical appointments: pcp visit today, then 4 week follow up . Over the next 120 days, patient will demonstrate improved health management independence as evidenced by remaining independent and having needed DME to remain independent in her home and community environment  . Over the next 120 days, the patient will demonstrate ongoing self health care management ability as evidenced by increased mobility and receiving needed scooter for increased mobility needs.   Interventions:  . Inter-disciplinary care team collaboration (see longitudinal plan of care) . Evaluation of current treatment plan related to chronic conditions and mobility needs and patient's adherence to plan as established by provider. . Advised patient to call the [facility] for any concerns related to the status of DME . Provided education to patient re: calling the DME company and talking to the staff concerning the patients electric scooter.  Call made to [employer] and talked with Otila Back at [phone] option [phone] and ext [phone].  Per Santiago Glad she received approval from [facility] today and is going to call the patient this afternoon and let her know she has been approved and arrange insurance and delivery.  Marland Kitchen Collaborated with pcp and supply company  regarding order for DME and if they need additional information before processing DME.  Per Santiago Glad at [employer] no additional information required at this time to process the order.  . Discussed plans with patient for ongoing care management follow up and provided patient with  direct contact information for care management team . Reviewed scheduled/upcoming provider appointments including: completed pcp appointment today and will follow up in 4 weeks with pcp after medication changes.   Patient Self Care Activities:  . Patient verbalizes understanding of plan to receive information about electric scooter once the RNCM contacts the company to find out the status of the DME . Performs IADL's independently . Calls provider office for new concerns or questions . Unable to independently obtain needed DME   Initial goal documentation        Patient verbalizes understanding of instructions provided today.   The care management team will reach out to the patient again over the next 30 to 60 days.   Noreene Larsson RN, MSN, [facility] Mobile: [phone]

## 2019-07-09 ENCOUNTER — Telehealth: Payer: Self-pay

## 2019-07-09 DIAGNOSIS — N186 End stage renal disease: Secondary | ICD-10-CM | POA: Diagnosis not present

## 2019-07-09 DIAGNOSIS — Z992 Dependence on renal dialysis: Secondary | ICD-10-CM | POA: Diagnosis not present

## 2019-07-09 NOTE — Telephone Encounter (Signed)
Copied from [place] [phone]. Topic: Referral - Status >> Jul 09, 2019 43:83 AM Simone Curia D wrote: [birth year] Spoke with patient about house cleaning services.  Will follow-up next week. Ambrose Mantle [phone]

## 2019-07-12 DIAGNOSIS — N186 End stage renal disease: Secondary | ICD-10-CM | POA: Diagnosis not present

## 2019-07-12 DIAGNOSIS — Z992 Dependence on renal dialysis: Secondary | ICD-10-CM | POA: Diagnosis not present

## 2019-07-14 ENCOUNTER — Telehealth: Payer: Self-pay

## 2019-07-14 NOTE — Telephone Encounter (Signed)
Copied from [place] [phone]. Topic: Referral - Status >> Jul 14, 2019  7:09 PM Simone Curia D wrote: [birth year] Spoke with patient her daughters will take care of cleaning her home.  There are no other resources needed for this patient at this time.  Closing referral. Ambrose Mantle [phone]

## 2019-07-14 NOTE — Telephone Encounter (Signed)
Copied from [place] [phone]. Topic: Referral - Status >> Jul 14, 2019  1:18 PM Simone Curia D wrote: [birth year] Left message on voicemail for patient to return my call regarding home cleaning service. Ambrose Mantle [phone]

## 2019-07-16 DIAGNOSIS — N186 End stage renal disease: Secondary | ICD-10-CM | POA: Diagnosis not present

## 2019-07-16 DIAGNOSIS — Z992 Dependence on renal dialysis: Secondary | ICD-10-CM | POA: Diagnosis not present

## 2019-07-19 DIAGNOSIS — N186 End stage renal disease: Secondary | ICD-10-CM | POA: Diagnosis not present

## 2019-07-19 DIAGNOSIS — Z992 Dependence on renal dialysis: Secondary | ICD-10-CM | POA: Diagnosis not present

## 2019-07-23 DIAGNOSIS — Z992 Dependence on renal dialysis: Secondary | ICD-10-CM | POA: Diagnosis not present

## 2019-07-23 DIAGNOSIS — N186 End stage renal disease: Secondary | ICD-10-CM | POA: Diagnosis not present

## 2019-07-26 DIAGNOSIS — Z992 Dependence on renal dialysis: Secondary | ICD-10-CM | POA: Diagnosis not present

## 2019-07-26 DIAGNOSIS — N186 End stage renal disease: Secondary | ICD-10-CM | POA: Diagnosis not present

## 2019-07-26 DIAGNOSIS — N2581 Secondary hyperparathyroidism of renal origin: Secondary | ICD-10-CM | POA: Diagnosis not present

## 2019-07-26 DIAGNOSIS — D509 Iron deficiency anemia, unspecified: Secondary | ICD-10-CM | POA: Diagnosis not present

## 2019-07-29 DIAGNOSIS — M810 Age-related osteoporosis without current pathological fracture: Secondary | ICD-10-CM | POA: Diagnosis not present

## 2019-07-29 DIAGNOSIS — I739 Peripheral vascular disease, unspecified: Secondary | ICD-10-CM | POA: Diagnosis not present

## 2019-07-29 DIAGNOSIS — N186 End stage renal disease: Secondary | ICD-10-CM | POA: Diagnosis not present

## 2019-07-30 DIAGNOSIS — Z992 Dependence on renal dialysis: Secondary | ICD-10-CM | POA: Diagnosis not present

## 2019-07-30 DIAGNOSIS — N186 End stage renal disease: Secondary | ICD-10-CM | POA: Diagnosis not present

## 2019-08-02 DIAGNOSIS — D509 Iron deficiency anemia, unspecified: Secondary | ICD-10-CM | POA: Diagnosis not present

## 2019-08-02 DIAGNOSIS — N186 End stage renal disease: Secondary | ICD-10-CM | POA: Diagnosis not present

## 2019-08-02 DIAGNOSIS — Z992 Dependence on renal dialysis: Secondary | ICD-10-CM | POA: Diagnosis not present

## 2019-08-03 ENCOUNTER — Encounter (INDEPENDENT_AMBULATORY_CARE_PROVIDER_SITE_OTHER): Payer: Self-pay | Admitting: Vascular Surgery

## 2019-08-03 ENCOUNTER — Ambulatory Visit (INDEPENDENT_AMBULATORY_CARE_PROVIDER_SITE_OTHER): Payer: Medicare Other | Admitting: Vascular Surgery

## 2019-08-03 ENCOUNTER — Ambulatory Visit (INDEPENDENT_AMBULATORY_CARE_PROVIDER_SITE_OTHER): Payer: Medicare Other

## 2019-08-03 ENCOUNTER — Other Ambulatory Visit: Payer: Self-pay

## 2019-08-03 VITALS — BP 117/74 | HR 69 | Ht 63.0 in | Wt 129.0 lb

## 2019-08-03 DIAGNOSIS — E785 Hyperlipidemia, unspecified: Secondary | ICD-10-CM | POA: Diagnosis not present

## 2019-08-03 DIAGNOSIS — N186 End stage renal disease: Secondary | ICD-10-CM | POA: Diagnosis not present

## 2019-08-03 DIAGNOSIS — I129 Hypertensive chronic kidney disease with stage 1 through stage 4 chronic kidney disease, or unspecified chronic kidney disease: Secondary | ICD-10-CM

## 2019-08-03 NOTE — Assessment & Plan Note (Signed)
Her fistula is currently working well.  It had some mild stenosis on the previous duplex that was not seen today.  I think we can stretch her follow-up to once a year.

## 2019-08-03 NOTE — Progress Notes (Signed)
MRN : [id number]  Marissa Hess is a 85 y.o. (1935-02-02) female who presents with chief complaint of  Chief Complaint  Patient presents with  . Follow-up    U/S Follow up  .  History of Present Illness: Patient returns today in follow up of her dialysis access.  It is working well without any major issues currently.  Duplex today shows it to be widely patent without any significant stenosis.  Current Outpatient Medications  Medication Sig Dispense Refill  . aspirin EC 81 MG tablet Take 81 mg by mouth daily.    . carvedilol (COREG) 25 MG tablet Take 1 tablet (25 mg total) by mouth 2 (two) times daily with a meal. 60 tablet 12  . cholecalciferol (VITAMIN D) 1000 units tablet Take 1,000 Units by mouth daily.     . ferrous sulfate (SLOW RELEASE IRON) 160 (50 Fe) MG TBCR SR tablet Take 1 tablet by mouth daily.     . folic acid (FOLVITE) 1 MG tablet Take 1 tablet (1 mg total) by mouth daily. 30 tablet 12  . rOPINIRole (REQUIP) 0.25 MG tablet Take 1 tablet (0.25 mg total) by mouth 2 (two) times daily. 60 tablet 1  . azelastine (ASTELIN) 0.1 % nasal spray Place into both nostrils 2 (two) times daily. Place 1 spray into both nostril 2 (two) times daily. Use in each nostril as directed. Starting Thu 10/08/2018     No current facility-administered medications for this visit.    Past Medical History:  Diagnosis Date  . Allergy   . Arthritis   . Chronic kidney disease    CKD V  . GERD (gastroesophageal reflux disease)   . Hypertension   . Lupus ([place]) 2002  . Macular degeneration of left eye   . Myocardial infarction ([place]) 2007  . Osteoporosis     Past Surgical History:  Procedure Laterality Date  . A/V FISTULAGRAM Right 01/16/2018   Procedure: A/V FISTULAGRAM;  Surgeon: [name], MD;  Location: [facility];  Service: Cardiovascular;  Laterality: Right;  . ABDOMINAL HYSTERECTOMY    . AV FISTULA PLACEMENT Right 12/17/2017   Procedure: ARTERIOVENOUS (AV) FISTULA  CREATION ( BRACHIO CEPHALIC POSS. BRACHIO BASILIC );  Surgeon: Algernon Huxley, MD;  Location: [facility];  Service: Vascular;  Laterality: Right;  . CATARACT EXTRACTION W/PHACO Right 01/03/2016   Procedure: CATARACT EXTRACTION PHACO AND INTRAOCULAR LENS PLACEMENT ([place]);  Surgeon: Leandrew Koyanagi, MD;  Location: [facility];  Service: Ophthalmology;  Laterality: Right;  . COLON SURGERY     intestine became twisted after hernia sugery  . EYE SURGERY     cataract  . HERNIA REPAIR    . TEMPORAL ARTERY BIOPSY / LIGATION    . TONSILLECTOMY       Social History   Tobacco Use  . Smoking status: Former Smoker    Types: Cigarettes    Start date: 09/11/1984    Quit date: 09/26/1994    Years since quitting: 24.8  . Smokeless tobacco: Never Used  Substance Use Topics  . Alcohol use: No    Alcohol/week: 0.0 standard drinks  . Drug use: No       Family History  Problem Relation Age of Onset  . Stomach cancer Mother   . Rheum arthritis Father   . Breast cancer Neg Hx      Allergies  Allergen Reactions  . Codeine Itching  . Ibuprofen     Avoids "because of lupus"  .  Sulfa Antibiotics Diarrhea  . Vioxx [Rofecoxib]     Avoids "because of lupus"  . Celebrex [Celecoxib] Rash    Avoids "because of lupus"  . Penicillins Rash    REVIEW OF SYSTEMS (Negative unless checked)  Constitutional: [] ?Weight loss  [] ?Fever  [] ?Chills Cardiac: [] ?Chest pain   [] ?Chest pressure   [] ?Palpitations   [] ?Shortness of breath when laying flat   [] ?Shortness of breath at rest   [] ?Shortness of breath with exertion. Vascular:  [] ?Pain in legs with walking   [] ?Pain in legs at rest   [] ?Pain in legs when laying flat   [] ?Claudication   [] ?Pain in feet when walking  [] ?Pain in feet at rest  [] ?Pain in feet when laying flat   [] ?History of DVT   [] ?Phlebitis   [] ?Swelling in legs   [] ?Varicose veins   [] ?Non-healing ulcers Pulmonary:   [] ?Uses home oxygen   [] ?Productive cough   [] ?Hemoptysis    [] ?Wheeze  [] ?COPD   [] ?Asthma Neurologic:  [] ?Dizziness  [] ?Blackouts   [] ?Seizures   [] ?History of stroke   [] ?History of TIA  [] ?Aphasia   [] ?Temporary blindness   [] ?Dysphagia   [] ?Weakness or numbness in arms   [] ?Weakness or numbness in legs Musculoskeletal:  [x] ?Arthritis   [] ?Joint swelling   [] ?Joint pain   [] ?Low back pain Hematologic:  [] ?Easy bruising  [] ?Easy bleeding   [] ?Hypercoagulable state   [x] ?Anemic   Gastrointestinal:  [] ?Blood in stool   [] ?Vomiting blood  [x] ?Gastroesophageal reflux/heartburn   [] ?Abdominal pain Genitourinary:  [x] ?Chronic kidney disease   [] ?Difficult urination  [] ?Frequent urination  [] ?Burning with urination   [] ?Hematuria Skin:  [] ?Rashes   [] ?Ulcers   [] ?Wounds Psychological:  [] ?History of anxiety   [] ? History of major depression.  Physical Examination  BP 117/74   Pulse 69   Ht 5\' 3"  (1.6 m)   Wt 129 lb (58.5 kg)   BMI 22.85 kg/m  Gen:  WD/WN, NAD.  Appears younger than stated age Head: [place]/AT, No temporalis wasting. Ear/Nose/Throat: Hearing grossly intact, nares w/o erythema or drainage Eyes: Conjunctiva clear. Sclera non-icteric Neck: Supple.  Trachea midline Pulmonary:  Good air movement, no use of accessory muscles.  Cardiac: RRR, no JVD Vascular: Excellent thrill in right brachiocephalic AV fistula. Vessel Right Left  Radial Palpable Palpable           Musculoskeletal: M/S 5/5 throughout.  No deformity or atrophy.  No significant lower extremity edema. Neurologic: Sensation grossly intact in extremities.  Symmetrical.  Speech is fluent.  Psychiatric: Judgment intact, Mood & affect appropriate for pt's clinical situation. Dermatologic: No rashes or ulcers noted.  No cellulitis or open wounds.       Labs No results found for this or any previous visit (from the past 2160 hour(s)).  Radiology VAS US DUPLEX DIALYSIS ACCESS (AVF,AVG)  Result Date: 08/03/2019 DIALYSIS ACCESS Access Site: Right Upper Extremity. Access Type:  Brachial-cephalic AVF. Comparison Study: 02/02/2019 Performing Technologist: Almira Coaster RVS  Examination Guidelines: A complete evaluation includes B-mode imaging, spectral Doppler, color Doppler, and power Doppler as needed of all accessible portions of each vessel. Unilateral testing is considered an integral part of a complete examination. Limited examinations for reoccurring indications may be performed as noted.  Findings: +--------------------+----------+-----------------+--------+ AVF                 PSV (cm/s)Flow Vol (mL/min)Comments +--------------------+----------+-----------------+--------+ Native artery inflow   237          3285                +--------------------+----------+-----------------+--------+  AVF Anastomosis        445                              +--------------------+----------+-----------------+--------+  +------------+----------+-------------+----------+--------+ OUTFLOW VEINPSV (cm/s)Diameter (cm)Depth (cm)Describe +------------+----------+-------------+----------+--------+ Confluence     213                                    +------------+----------+-------------+----------+--------+ Shoulder       130                                    +------------+----------+-------------+----------+--------+ Prox UA         55                                    +------------+----------+-------------+----------+--------+ Mid UA         195                                    +------------+----------+-------------+----------+--------+ Dist UA        626                                    +------------+----------+-------------+----------+--------+  +--------------+-------------+---------+---------+----------+------------------+               Diameter (cm)  Depth  BranchingPSV (cm/s)   Flow Volume                                  (cm)                           (ml/min)       +--------------+-------------+---------+---------+----------+------------------+ Rt Rad Art                                       23                       Dist                                                                      +--------------+-------------+---------+---------+----------+------------------+  Summary: The Right Brachial Cephalic AVF appears to be patent throughout; Flow Volume appears to Normal. In comparison to prior study, Essentially no change.  *See table(s) above for measurements and observations.  Diagnosing physician: Leotis Pain MD Electronically signed by Leotis Pain MD on 08/03/2019 at 11:11:19 AM.    --------------------------------------------------------------------------------   Final     Assessment/Plan Hypertensive nephropathy blood pressure control important in reducing the progression of atherosclerotic disease. On appropriate oral medications.   Hyperlipemia lipid control important in reducing the progression of atherosclerotic disease.   ESRD (end  stage renal disease) ([place]) Her fistula is currently working well.  It had some mild stenosis on the previous duplex that was not seen today.  I think we can stretch her follow-up to once a year.    Leotis Pain, MD  08/03/2019 11:15 AM    This note was created with Dragon medical transcription system.  Any errors from dictation are purely unintentional

## 2019-08-06 DIAGNOSIS — Z992 Dependence on renal dialysis: Secondary | ICD-10-CM | POA: Diagnosis not present

## 2019-08-06 DIAGNOSIS — N186 End stage renal disease: Secondary | ICD-10-CM | POA: Diagnosis not present

## 2019-08-09 DIAGNOSIS — N186 End stage renal disease: Secondary | ICD-10-CM | POA: Diagnosis not present

## 2019-08-09 DIAGNOSIS — Z992 Dependence on renal dialysis: Secondary | ICD-10-CM | POA: Diagnosis not present

## 2019-08-11 ENCOUNTER — Other Ambulatory Visit: Payer: Self-pay

## 2019-08-11 ENCOUNTER — Ambulatory Visit (INDEPENDENT_AMBULATORY_CARE_PROVIDER_SITE_OTHER): Payer: Medicare Other | Admitting: Nurse Practitioner

## 2019-08-11 ENCOUNTER — Encounter: Payer: Self-pay | Admitting: Nurse Practitioner

## 2019-08-11 DIAGNOSIS — G2581 Restless legs syndrome: Secondary | ICD-10-CM

## 2019-08-11 MED ORDER — ROPINIROLE HCL 0.25 MG PO TABS: 0.2500 mg | ORAL_TABLET | Freq: Two times a day (BID) | ORAL | 3 refills | Status: DC

## 2019-08-11 NOTE — Progress Notes (Signed)
BP 124/72   Pulse 60   Temp 98.3 F (36.8 C) (Oral)   Wt 131 lb 12.8 oz (59.8 kg)   SpO2 99%   BMI 23.35 kg/m    Subjective:    Patient ID: Marissa Hess, female    DOB: 1934/08/12, 85 y.o.   MRN: [id number]  HPI: Marissa Hess is a 85 y.o. female  Chief Complaint  Patient presents with  . RLS    4 week f/up   RESTLESS LEGS Started on low dose Requip in March, which offered some benefit during night, but not during daytime hours.  Last visit increased to 0.25 MG BID -- she reports this has helped significantly, no further issues with leg discomfort.  Has tried Gabapentin and Tylenol in past with no benefit.  Has ESRD and limited on treatment options due to this. She denies any ADR with Requip and is tolerating well. Duration: chronic Discomfort description:  ill-defined Pain: no Location: lower legs Bilateral: yes Symmetric: yes Frequency:  none, improved Symptoms only occur while legs at rest: yes Sudden unintentional leg jerking: no Bed partner bothered by leg movements: no LE numbness: no Decreased sensation: no Weakness: no Insomnia: no Daytime somnolence: no Fatigue: no Alleviating factors: Requip Aggravating factors: unknown Status: better at night, needs a little more for daytime with dialysis Treatments attempted: Gabapentin and Tylenol  Relevant past medical, surgical, family and social history reviewed and updated as indicated. Interim medical history since our last visit reviewed. Allergies and medications reviewed and updated.  Review of Systems  Constitutional: Negative for activity change, appetite change, diaphoresis, fatigue and fever.  Respiratory: Negative for cough, chest tightness and shortness of breath.   Cardiovascular: Negative for chest pain, palpitations and leg swelling.  Gastrointestinal: Negative.   Musculoskeletal: Positive for arthralgias.  Neurological: Negative.   Psychiatric/Behavioral: Negative.     Per HPI unless  specifically indicated above     Objective:    BP 124/72   Pulse 60   Temp 98.3 F (36.8 C) (Oral)   Wt 131 lb 12.8 oz (59.8 kg)   SpO2 99%   BMI 23.35 kg/m   Wt Readings from Last 3 Encounters:  08/11/19 131 lb 12.8 oz (59.8 kg)  08/03/19 129 lb (58.5 kg)  07/06/19 129 lb (58.5 kg)    Physical Exam Vitals and nursing note reviewed.  Constitutional:      General: She is awake. She is not in acute distress.    Appearance: She is well-developed and well-groomed. She is not ill-appearing.     Comments: Frail appearing.  HENT:     Head: Normocephalic.     Right Ear: Hearing normal.     Left Ear: Hearing normal.  Eyes:     General: Lids are normal.        Right eye: No discharge.        Left eye: No discharge.     Conjunctiva/sclera: Conjunctivae normal.     Pupils: Pupils are equal, round, and reactive to light.  Neck:     Vascular: No carotid bruit.  Cardiovascular:     Rate and Rhythm: Normal rate and regular rhythm.     Heart sounds: Normal heart sounds. No murmur. No gallop.      Arteriovenous access: right arteriovenous access is present. Pulmonary:     Effort: Pulmonary effort is normal. No accessory muscle usage or respiratory distress.     Breath sounds: Normal breath sounds.  Abdominal:  General: Bowel sounds are normal.     Palpations: Abdomen is soft.  Musculoskeletal:     Cervical back: Normal range of motion and neck supple.     Right lower leg: No edema.     Left lower leg: No edema.     Comments: Strength upper and lower extremities bilaterally 3/5.  Skin:    General: Skin is warm and dry.  Neurological:     Mental Status: She is alert and oriented to person, place, and time.  Psychiatric:        Attention and Perception: Attention normal.        Mood and Affect: Mood normal.        Speech: Speech normal.        Behavior: Behavior normal. Behavior is cooperative.        Thought Content: Thought content normal.     Results for orders placed  or performed in visit on 11/26/18  Microscopic Examination   URINE  Result Value Ref Range   WBC, UA 6-10 (A) 0 - 5 /hpf   RBC 0-2 0 - 2 /hpf   Epithelial Cells (non renal) 0-10 0 - 10 /hpf   Casts Present None seen /lpf   Cast Type Hyaline casts N/A   Bacteria, UA Few (A) None seen/Few  Urine Culture, Reflex   URINE  Result Value Ref Range   Urine Culture, Routine Final report    Organism ID, Bacteria Comment   CBC with Differential/Platelet  Result Value Ref Range   WBC 5.1 3.4 - 10.8 x10E3/uL   RBC 3.91 3.77 - 5.28 x10E6/uL   Hemoglobin 11.2 11.1 - 15.9 g/dL   Hematocrit 34.4 34.0 - 46.6 %   MCV 88 79 - 97 fL   MCH 28.6 26.6 - 33.0 pg   MCHC 32.6 31.5 - 35.7 g/dL   RDW 14.9 11.7 - 15.4 %   Platelets 207 150 - 450 x10E3/uL   Neutrophils 63 Not Estab. %   Lymphs 21 Not Estab. %   Monocytes 12 Not Estab. %   Eos 3 Not Estab. %   Basos 1 Not Estab. %   Neutrophils Absolute 3.2 1.4 - 7.0 x10E3/uL   Lymphocytes Absolute 1.1 0.7 - 3.1 x10E3/uL   Monocytes Absolute 0.6 0.1 - 0.9 x10E3/uL   EOS (ABSOLUTE) 0.1 0.0 - 0.4 x10E3/uL   Basophils Absolute 0.1 0.0 - 0.2 x10E3/uL   Immature Granulocytes 0 Not Estab. %   Immature Grans (Abs) 0.0 0.0 - 0.1 x10E3/uL  Comprehensive metabolic panel  Result Value Ref Range   Glucose 72 65 - 99 mg/dL   BUN 33 (H) 8 - 27 mg/dL   Creatinine, Ser 2.48 (H) 0.57 - 1.00 mg/dL   GFR calc non Af Amer 18 (L) >59 mL/min/1.73   GFR calc Af Amer 20 (L) >59 mL/min/1.73   BUN/Creatinine Ratio 13 12 - 28   Sodium 140 134 - 144 mmol/L   Potassium 3.7 3.5 - 5.2 mmol/L   Chloride 98 96 - 106 mmol/L   CO2 28 20 - 29 mmol/L   Calcium 8.8 8.7 - 10.3 mg/dL   Total Protein 6.9 6.0 - 8.5 g/dL   Albumin 3.4 (L) 3.6 - 4.6 g/dL   Globulin, Total 3.5 1.5 - 4.5 g/dL   Albumin/Globulin Ratio 1.0 (L) 1.2 - 2.2   Bilirubin Total 0.5 0.0 - 1.2 mg/dL   Alkaline Phosphatase 78 39 - 117 IU/L   AST 11 0 - 40 IU/L  ALT 7 0 - 32 IU/L  Lipid Panel w/o Chol/HDL Ratio    Result Value Ref Range   Cholesterol, Total 187 100 - 199 mg/dL   Triglycerides 110 0 - 149 mg/dL   HDL 51 >39 mg/dL   VLDL Cholesterol Cal 20 5 - 40 mg/dL   LDL Chol Calc (NIH) 116 (H) 0 - 99 mg/dL  TSH  Result Value Ref Range   TSH 3.570 0.450 - 4.500 uIU/mL  UA/M w/rflx Culture, Routine   Specimen: Urine   URINE  Result Value Ref Range   Specific Gravity, UA 1.015 1.005 - 1.030   pH, UA 7.5 5.0 - 7.5   Color, UA Yellow Yellow   Appearance Ur Clear Clear   Leukocytes,UA 1+ (A) Negative   Protein,UA 2+ (A) Negative/Trace   Glucose, UA Negative Negative   Ketones, UA Negative Negative   RBC, UA Trace (A) Negative   Bilirubin, UA Negative Negative   Urobilinogen, Ur 0.2 0.2 - 1.0 mg/dL   Nitrite, UA Negative Negative   Microscopic Examination See below:    Urinalysis Reflex Comment       Assessment & Plan:   Problem List Items Addressed This Visit      Other   Restless leg syndrome    Chronic, ongoing.  No benefit from Gabapentin or Tylenol in past. Having benefit with increase in Requip to 0.25 MG BID, will continue this regimen.  Educated on this medication and side effects to monitor for.  Return in 5 months.          Follow up plan: Return in about 5 months (around 01/11/2020) for CKD, RLS, Gout, HLD.

## 2019-08-11 NOTE — Patient Instructions (Addendum)
LUBRIDERM AND AQUAPHOR CREAM FOR DRY SKIN  Restless Legs Syndrome Restless legs syndrome is a condition that causes uncomfortable feelings or sensations in the legs, especially while sitting or lying down. The sensations usually cause an overwhelming urge to move the legs. The arms can also sometimes be affected. The condition can range from mild to severe. The symptoms often interfere with a person's ability to sleep. What are the causes? The cause of this condition is not known. What increases the risk? The following factors may make you more likely to develop this condition:  Being older than 50.  Pregnancy.  Being a woman. In general, the condition is more common in women than in men.  A family history of the condition.  Having iron deficiency.  Overuse of caffeine, nicotine, or alcohol.  Certain medical conditions, such as kidney disease, Parkinson's disease, or nerve damage.  Certain medicines, such as those for high blood pressure, nausea, colds, allergies, depression, and some heart conditions. What are the signs or symptoms? The main symptom of this condition is uncomfortable sensations in the legs, such as:  Pulling.  Tingling.  Prickling.  Throbbing.  Crawling.  Burning. Usually, the sensations:  Affect both sides of the body.  Are worse when you sit or lie down.  Are worse at night. These may wake you up or make it difficult to fall asleep.  Make you have a strong urge to move your legs.  Are temporarily relieved by moving your legs. The arms can also be affected, but this is rare. People who have this condition often have tiredness during the day because of their lack of sleep at night. How is this diagnosed? This condition may be diagnosed based on:  Your symptoms.  Blood tests. In some cases, you may be monitored in a sleep lab by a specialist (a sleep study). This can detect any disruptions in your sleep. How is this treated? This condition  is treated by managing the symptoms. This may include:  Lifestyle changes, such as exercising, using relaxation techniques, and avoiding caffeine, alcohol, or tobacco.  Medicines. Anti-seizure medicines may be tried first. Follow these instructions at home:     General instructions  Take over-the-counter and prescription medicines only as told by your health care provider.  Use methods to help relieve the uncomfortable sensations, such as: ? Massaging your legs. ? Walking or stretching. ? Taking a cold or hot bath.  Keep all follow-up visits as told by your health care provider. This is important. Lifestyle  Practice good sleep habits. For example, go to bed and get up at the same time every day. Most adults should get 7-9 hours of sleep each night.  Exercise regularly. Try to get at least 30 minutes of exercise most days of the week.  Practice ways of relaxing, such as yoga or meditation.  Avoid caffeine and alcohol.  Do not use any products that contain nicotine or tobacco, such as cigarettes and e-cigarettes. If you need help quitting, ask your health care provider. Contact a health care provider if:  Your symptoms get worse or they do not improve with treatment. Summary  Restless legs syndrome is a condition that causes uncomfortable feelings or sensations in the legs, especially while sitting or lying down.  The symptoms often interfere with a person's ability to sleep.  This condition is treated by managing the symptoms. You may need to make lifestyle changes or take medicines. This information is not intended to replace advice given to  you by your health care provider. Make sure you discuss any questions you have with your health care provider. Document Revised: 03/24/2017 Document Reviewed: 03/24/2017 Elsevier Patient Education  [place].

## 2019-08-11 NOTE — Assessment & Plan Note (Signed)
Chronic, ongoing.  No benefit from Gabapentin or Tylenol in past. Having benefit with increase in Requip to 0.25 MG BID, will continue this regimen.  Educated on this medication and side effects to monitor for.  Return in 5 months.

## 2019-08-13 DIAGNOSIS — Z992 Dependence on renal dialysis: Secondary | ICD-10-CM | POA: Diagnosis not present

## 2019-08-13 DIAGNOSIS — N186 End stage renal disease: Secondary | ICD-10-CM | POA: Diagnosis not present

## 2019-08-16 DIAGNOSIS — Z992 Dependence on renal dialysis: Secondary | ICD-10-CM | POA: Diagnosis not present

## 2019-08-16 DIAGNOSIS — N186 End stage renal disease: Secondary | ICD-10-CM | POA: Diagnosis not present

## 2019-08-17 ENCOUNTER — Ambulatory Visit: Payer: Self-pay | Admitting: General Practice

## 2019-08-17 ENCOUNTER — Telehealth: Payer: Self-pay

## 2019-08-17 NOTE — Chronic Care Management (AMB) (Signed)
  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 The patient was referred to the case management team for assistance with care management and care coordination.   Follow Up Plan: A HIPPA compliant phone message was left for the patient providing contact information and requesting a return call.   Noreene Larsson RN, MSN, [facility] Mobile: [phone]

## 2019-08-20 DIAGNOSIS — N186 End stage renal disease: Secondary | ICD-10-CM | POA: Diagnosis not present

## 2019-08-20 DIAGNOSIS — Z992 Dependence on renal dialysis: Secondary | ICD-10-CM | POA: Diagnosis not present

## 2019-08-23 DIAGNOSIS — Z992 Dependence on renal dialysis: Secondary | ICD-10-CM | POA: Diagnosis not present

## 2019-08-23 DIAGNOSIS — N186 End stage renal disease: Secondary | ICD-10-CM | POA: Diagnosis not present

## 2019-08-25 ENCOUNTER — Ambulatory Visit (INDEPENDENT_AMBULATORY_CARE_PROVIDER_SITE_OTHER): Payer: Medicare Other

## 2019-08-25 VITALS — Ht 63.0 in | Wt 132.0 lb

## 2019-08-25 DIAGNOSIS — Z0000 Encounter for general adult medical examination without abnormal findings: Secondary | ICD-10-CM

## 2019-08-25 NOTE — Patient Instructions (Signed)
Marissa Hess , Thank you for taking time to come for your Medicare Wellness Visit. I appreciate your ongoing commitment to your health goals. Please review the following plan we discussed and let me know if I can assist you in the future.   Screening recommendations/referrals: Colonoscopy: no longer required  Mammogram: no longer required  Bone Density: no longer required  Recommended yearly ophthalmology/optometry visit for glaucoma screening and checkup Recommended yearly dental visit for hygiene and checkup  Vaccinations: Influenza vaccine: due 11/2019 Pneumococcal vaccine: completed series  Tdap vaccine: up to date completed 2014 Shingles vaccine: shingrix eligible    Covid-19:completed series   Advanced directives: Advance directive discussed with you today. I have provided a copy for you to complete at home and have notarized. Once this is complete please bring a copy in to our office so we can scan it into your chart.  Conditions/risks identified: none   Next appointment: Follow up in one year for your annual wellness visit    Preventive Care 65 Years and Older, Female Preventive care refers to lifestyle choices and visits with your health care provider that can promote health and wellness. What does preventive care include?  A yearly physical exam. This is also called an annual well check.  Dental exams once or twice a year.  Routine eye exams. Ask your health care provider how often you should have your eyes checked.  Personal lifestyle choices, including:  Daily care of your teeth and gums.  Regular physical activity.  Eating a healthy diet.  Avoiding tobacco and drug use.  Limiting alcohol use.  Practicing safe sex.  Taking low-dose aspirin every day.  Taking vitamin and mineral supplements as recommended by your health care provider. What happens during an annual well check? The services and screenings done by your health care provider during your annual  well check will depend on your age, overall health, lifestyle risk factors, and family history of disease. Counseling  Your health care provider may ask you questions about your:  Alcohol use.  Tobacco use.  Drug use.  Emotional well-being.  Home and relationship well-being.  Sexual activity.  Eating habits.  History of falls.  Memory and ability to understand (cognition).  Work and work Statistician.  Reproductive health. Screening  You may have the following tests or measurements:  Height, weight, and BMI.  Blood pressure.  Lipid and cholesterol levels. These may be checked every 5 years, or more frequently if you are over 60 years old.  Skin check.  Lung cancer screening. You may have this screening every year starting at age 36 if you have a 30-pack-year history of smoking and currently smoke or have quit within the past 15 years.  Fecal occult blood test (FOBT) of the stool. You may have this test every year starting at age 27.  Flexible sigmoidoscopy or colonoscopy. You may have a sigmoidoscopy every 5 years or a colonoscopy every 10 years starting at age 12.  Hepatitis C blood test.  Hepatitis B blood test.  Sexually transmitted disease (STD) testing.  Diabetes screening. This is done by checking your blood sugar (glucose) after you have not eaten for a while (fasting). You may have this done every 1-3 years.  Bone density scan. This is done to screen for osteoporosis. You may have this done starting at age 26.  Mammogram. This may be done every 1-2 years. Talk to your health care provider about how often you should have regular mammograms. Talk with your health  care provider about your test results, treatment options, and if necessary, the need for more tests. Vaccines  Your health care provider may recommend certain vaccines, such as:  Influenza vaccine. This is recommended every year.  Tetanus, diphtheria, and acellular pertussis (Tdap, Td) vaccine.  You may need a Td booster every 10 years.  Zoster vaccine. You may need this after age 36.  Pneumococcal 13-valent conjugate (PCV13) vaccine. One dose is recommended after age 61.  Pneumococcal polysaccharide (PPSV23) vaccine. One dose is recommended after age 1. Talk to your health care provider about which screenings and vaccines you need and how often you need them. This information is not intended to replace advice given to you by your health care provider. Make sure you discuss any questions you have with your health care provider. Document Released: 03/31/2015 Document Revised: 11/22/2015 Document Reviewed: 01/03/2015 Elsevier Interactive Patient Education  2017 [place] Prevention in the Home Falls can cause injuries. They can happen to people of all ages. There are many things you can do to make your home safe and to help prevent falls. What can I do on the outside of my home?  Regularly fix the edges of walkways and driveways and fix any cracks.  Remove anything that might make you trip as you walk through a door, such as a raised step or threshold.  Trim any bushes or trees on the path to your home.  Use bright outdoor lighting.  Clear any walking paths of anything that might make someone trip, such as rocks or tools.  Regularly check to see if handrails are loose or broken. Make sure that both sides of any steps have handrails.  Any raised decks and porches should have guardrails on the edges.  Have any leaves, snow, or ice cleared regularly.  Use sand or salt on walking paths during winter.  Clean up any spills in your garage right away. This includes oil or grease spills. What can I do in the bathroom?  Use night lights.  Install grab bars by the toilet and in the tub and shower. Do not use towel bars as grab bars.  Use non-skid mats or decals in the tub or shower.  If you need to sit down in the shower, use a plastic, non-slip stool.  Keep the  floor dry. Clean up any water that spills on the floor as soon as it happens.  Remove soap buildup in the tub or shower regularly.  Attach bath mats securely with double-sided non-slip rug tape.  Do not have throw rugs and other things on the floor that can make you trip. What can I do in the bedroom?  Use night lights.  Make sure that you have a light by your bed that is easy to reach.  Do not use any sheets or blankets that are too big for your bed. They should not hang down onto the floor.  Have a firm chair that has side arms. You can use this for support while you get dressed.  Do not have throw rugs and other things on the floor that can make you trip. What can I do in the kitchen?  Clean up any spills right away.  Avoid walking on wet floors.  Keep items that you use a lot in easy-to-reach places.  If you need to reach something above you, use a strong step stool that has a grab bar.  Keep electrical cords out of the way.  Do not use floor  polish or wax that makes floors slippery. If you must use wax, use non-skid floor wax.  Do not have throw rugs and other things on the floor that can make you trip. What can I do with my stairs?  Do not leave any items on the stairs.  Make sure that there are handrails on both sides of the stairs and use them. Fix handrails that are broken or loose. Make sure that handrails are as long as the stairways.  Check any carpeting to make sure that it is firmly attached to the stairs. Fix any carpet that is loose or worn.  Avoid having throw rugs at the top or bottom of the stairs. If you do have throw rugs, attach them to the floor with carpet tape.  Make sure that you have a light switch at the top of the stairs and the bottom of the stairs. If you do not have them, ask someone to add them for you. What else can I do to help prevent falls?  Wear shoes that:  Do not have high heels.  Have rubber bottoms.  Are comfortable and fit  you well.  Are closed at the toe. Do not wear sandals.  If you use a stepladder:  Make sure that it is fully opened. Do not climb a closed stepladder.  Make sure that both sides of the stepladder are locked into place.  Ask someone to hold it for you, if possible.  Clearly mark and make sure that you can see:  Any grab bars or handrails.  First and last steps.  Where the edge of each step is.  Use tools that help you move around (mobility aids) if they are needed. These include:  Canes.  Walkers.  Scooters.  Crutches.  Turn on the lights when you go into a dark area. Replace any light bulbs as soon as they burn out.  Set up your furniture so you have a clear path. Avoid moving your furniture around.  If any of your floors are uneven, fix them.  If there are any pets around you, be aware of where they are.  Review your medicines with your doctor. Some medicines can make you feel dizzy. This can increase your chance of falling. Ask your doctor what other things that you can do to help prevent falls. This information is not intended to replace advice given to you by your health care provider. Make sure you discuss any questions you have with your health care provider. Document Released: 12/29/2008 Document Revised: 08/10/2015 Document Reviewed: 04/08/2014 Elsevier Interactive Patient Education  2017 [employer].

## 2019-08-25 NOTE — Progress Notes (Signed)
Subjective:   Marissa Hess is a 85 y.o. female who presents for Medicare Annual (Subsequent) preventive examination.  I connected with Marissa Hess today by telephone and verified that I am speaking with the correct person using two identifiers. Location patient: home Location provider: work Persons participating in the virtual visit: patient, Marine scientist.    I discussed the limitations, risks, security and privacy concerns of performing an evaluation and management service by telephone and the availability of in person appointments. I also discussed with the patient that there may be a patient responsible charge related to this service. The patient expressed understanding and verbally consented to this telephonic visit.    Interactive audio and video telecommunications were attempted between this provider and patient, however failed, due to patient having technical difficulties OR patient did not have access to video capability.  We continued and completed visit with audio only.  Some vital signs may be absent or patient reported.   Time Spent with patient on telephone encounter: 15 minutes  Review of Systems:   Cardiac Risk Factors include: advanced age (>58men, >85 women);dyslipidemia;hypertension     Objective:     Vitals: Ht 5\' 3"  (1.6 m)   Wt 132 lb (59.9 kg)   BMI 23.38 kg/m   Body mass index is 23.38 kg/m.  Advanced Directives 08/25/2019 09/10/2018 01/16/2018 01/13/2018 01/13/2018 12/17/2017 12/11/2017  Does Patient Have a Medical Advance Directive? No No No No No No No  Does patient want to make changes to medical advance directive? Yes (MAU/Ambulatory/Procedural Areas - Information given) - - - - - -  Would patient like information on creating a medical advance directive? - - No - Patient declined No - Patient declined - No - Patient declined No - Patient declined    Tobacco Social History   Tobacco Use  Smoking Status Former Smoker  . Types: Cigarettes  . Start date:  09/11/1984  . Quit date: 09/26/1994  . Years since quitting: 24.9  Smokeless Tobacco Never Used     Counseling given: Not Answered   Clinical Intake:  Pre-visit preparation completed: Yes  Pain : No/denies pain     Nutritional Status: BMI of 19-24  Normal Nutritional Risks: None Diabetes: No  How often do you need to have someone help you when you read instructions, pamphlets, or other written materials from your doctor or pharmacy?: 1 - Never  Interpreter Needed?: No  Information entered by :: Seiji Wiswell,LPN  Past Medical History:  Diagnosis Date  . Allergy   . Arthritis   . Chronic kidney disease    CKD V  . GERD (gastroesophageal reflux disease)   . Hypertension   . Lupus ([place]) 2002  . Macular degeneration of left eye   . Myocardial infarction ([place]) 2007  . Osteoporosis    Past Surgical History:  Procedure Laterality Date  . A/V FISTULAGRAM Right 01/16/2018   Procedure: A/V FISTULAGRAM;  Surgeon: [name], MD;  Location: [facility];  Service: Cardiovascular;  Laterality: Right;  . ABDOMINAL HYSTERECTOMY    . AV FISTULA PLACEMENT Right 12/17/2017   Procedure: ARTERIOVENOUS (AV) FISTULA CREATION ( BRACHIO CEPHALIC POSS. BRACHIO BASILIC );  Surgeon: Algernon Huxley, MD;  Location: [facility];  Service: Vascular;  Laterality: Right;  . CATARACT EXTRACTION W/PHACO Right 01/03/2016   Procedure: CATARACT EXTRACTION PHACO AND INTRAOCULAR LENS PLACEMENT ([place]);  Surgeon: Leandrew Koyanagi, MD;  Location: [place];  Service: Ophthalmology;  Laterality: Right;  . COLON SURGERY  intestine became twisted after hernia sugery  . EYE SURGERY     cataract  . HERNIA REPAIR    . TEMPORAL ARTERY BIOPSY / LIGATION    . TONSILLECTOMY     Family History  Problem Relation Age of Onset  . Stomach cancer Mother   . Rheum arthritis Father   . Breast cancer Neg Hx    Social History   Socioeconomic History  . Marital status: Married    Spouse name:  Not on file  . Number of children: Not on file  . Years of education: Not on file  . Highest education level: High school graduate  Occupational History    Comment: Recruitment consultant; [employer]; child care  Tobacco Use  . Smoking status: Former Smoker    Types: Cigarettes    Start date: 09/11/1984    Quit date: 09/26/1994    Years since quitting: 24.9  . Smokeless tobacco: Never Used  Substance and Sexual Activity  . Alcohol use: No    Alcohol/week: 0.0 standard drinks  . Drug use: No  . Sexual activity: Not on file  Other Topics Concern  . Not on file  Social History Narrative  . Not on file   Social Determinants of Health   Financial Resource Strain: Low Risk   . Difficulty of Paying Living Expenses: Not hard at all  Food Insecurity: No Food Insecurity  . Worried About Charity fundraiser in the Last Year: Never true  . Ran Out of Food in the Last Year: Never true  Transportation Needs: No Transportation Needs  . Lack of Transportation (Medical): No  . Lack of Transportation (Non-Medical): No  Physical Activity: Insufficiently Active  . Days of Exercise per Week: 4 days  . Minutes of Exercise per Session: 20 min  Stress: No Stress Concern Present  . Feeling of Stress : Not at all  Social Connections: Not Isolated  . Frequency of Communication with Friends and Family: More than three times a week  . Frequency of Social Gatherings with Friends and Family: More than three times a week  . Attends Religious Services: More than 4 times per year  . Active Member of Clubs or Organizations: Yes  . Attends Archivist Meetings: More than 4 times per year  . Marital Status: Married    Outpatient Encounter Medications as of 08/25/2019  Medication Sig  . aspirin EC 81 MG tablet Take 81 mg by mouth daily.  Marland Kitchen azelastine (ASTELIN) 0.1 % nasal spray Place into both nostrils 2 (two) times daily. Place 1 spray into both nostril 2 (two) times daily. Use in each nostril as directed.  Starting Thu 10/08/2018  . carvedilol (COREG) 25 MG tablet Take 1 tablet (25 mg total) by mouth 2 (two) times daily with a meal.  . cholecalciferol (VITAMIN D) 1000 units tablet Take 1,000 Units by mouth daily.   . ferrous sulfate (SLOW RELEASE IRON) 160 (50 Fe) MG TBCR SR tablet Take 1 tablet by mouth daily.   . folic acid (FOLVITE) 1 MG tablet Take 1 tablet (1 mg total) by mouth daily.  Marland Kitchen rOPINIRole (REQUIP) 0.25 MG tablet Take 1 tablet (0.25 mg total) by mouth 2 (two) times daily.   No facility-administered encounter medications on file as of 08/25/2019.    Activities of Daily Living In your present state of health, do you have any difficulty performing the following activities: 08/25/2019 11/26/2018  Hearing? N N  Comment no hearing aids -  Vision? N  N  Comment eyeglasses, [facility] -  Difficulty concentrating or making decisions? N N  Walking or climbing stairs? N N  Dressing or bathing? N N  Doing errands, shopping? N N  Preparing Food and eating ? N -  Using the Toilet? N -  In the past six months, have you accidently leaked urine? N -  Do you have problems with loss of bowel control? N -  Managing your Medications? N -  Managing your Finances? N -  Housekeeping or managing your Housekeeping? N -  Some recent data might be hidden    Patient Care Team: Venita Lick, NP as PCP - General (Nurse Practitioner) Lutricia Horsfall, [name], MD as Referring Physician (Ophthalmology) De Hollingshead, [facility] as Pharmacist (Pharmacist) Vanita Ingles, RN as Case Manager (General Practice)    Assessment:   This is a routine wellness examination for [place].  Exercise Activities and Dietary recommendations Current Exercise Habits: Home exercise routine, Time (Minutes): 15, Frequency (Times/Week): 7, Weekly Exercise (Minutes/Week): 105, Intensity: Mild, Exercise limited by: None identified  Goals Addressed   None     Fall  Risk: Fall Risk  08/25/2019 11/26/2018 09/29/2018 09/10/2018 09/03/2017  Falls in the past year? 1 1 0 1 No  Number falls in past yr: 0 0 0 0 -  Injury with Fall? 0 1 0 0 -  Follow up - - Falls evaluation completed - -    FALL RISK PREVENTION PERTAINING TO THE HOME:  Any stairs in or around the home? Yes  If so, are there any without handrails? No   Home free of loose throw rugs in walkways, pet beds, electrical cords, etc? Yes  Adequate lighting in your home to reduce risk of falls? Yes   ASSISTIVE DEVICES UTILIZED TO PREVENT FALLS:  Life alert? No  Use of a cane, walker or w/c? Yes  Grab bars in the bathroom? No  Shower chair or bench in shower? Yes  Elevated toilet seat or a handicapped toilet? No   DME ORDERS:  DME order needed?  No   TIMED UP AND GO:  Unable to perform    Depression Screen PHQ 2/9 Scores 08/25/2019 11/26/2018 09/10/2018 09/03/2017  PHQ - 2 Score 0 1 0 2  PHQ- 9 Score - 4 - 3     Cognitive Function     6CIT Screen 09/10/2018 09/03/2017 08/29/2016  What Year? 0 points 0 points 0 points  What month? 0 points 0 points 0 points  What time? 0 points 0 points 0 points  Count back from 20 0 points 0 points 0 points  Months in reverse 0 points 0 points 0 points  Repeat phrase 0 points 2 points 2 points  Total Score 0 2 2    Immunization History  Administered Date(s) Administered  . Fluad Quad(high Dose 65+) 11/26/2018  . Hepatitis B, adult 02/06/2018, 03/06/2018, 04/08/2018, 08/07/2018  . Influenza, High Dose Seasonal PF 12/20/2016  . Influenza,inj,Quad PF,6+ Mos 03/06/2015  . Influenza-Unspecified 12/17/2015, 01/16/2018, 02/13/2018, 11/23/2018, 12/10/2018  . Moderna SARS-COVID-2 Vaccination 05/15/2019, 06/12/2019  . PPD Test 02/06/2018, 02/16/2018  . Pneumococcal Polysaccharide-23 02/20/2018  . Pneumococcal-Unspecified 07/28/2008  . Tdap 05/26/2012    Qualifies for Shingles Vaccine? Yes  Zostavax completed n/a. Due for Shingrix. Education has been  provided regarding the importance of this vaccine. Pt has been advised to call insurance company to determine out of pocket expense. Advised may also receive vaccine at local  pharmacy or Health Dept. Verbalized acceptance and understanding.  Tdap: up to date   Flu Vaccine: up to date   Pneumococcal Vaccine: up to date   Covid-19 Vaccine: Completed vaccines  Screening Tests Health Maintenance  Topic Date Due  . PNA vac Low Risk Adult (2 of 2 - PCV13) 02/21/2019  . INFLUENZA VACCINE  10/17/2019  . TETANUS/TDAP  05/27/2022  . DEXA SCAN  Completed  . COVID-19 Vaccine  Completed    Cancer Screenings:  Colorectal Screening: no longer required   Mammogram: no longer required   Bone Density: no longer required   Lung Cancer Screening: (Low Dose CT Chest recommended if Age 39-80 years, 30 pack-year currently smoking OR have quit w/in 15years.) does not qualify.   Lung Cancer Screening Referral: An Epic message has been sent to Burgess Estelle, RN (Oncology Nurse Navigator) regarding the possible need for this exam. Raquel Sarna will review the patient's chart to determine if the patient truly qualifies for the exam. If the patient qualifies, Raquel Sarna will order the Low Dose CT of the chest to facilitate the scheduling of this exam.  Additional Screening:  Hepatitis C Screening: does not qualify  Vision Screening: Recommended annual ophthalmology exams for early detection of glaucoma and other disorders of the eye. Is the patient up to date with their annual eye exam?  Yes  Who is the provider or what is the name of the office in which the pt attends annual eye exams? [facility]   Dental Screening: Recommended annual dental exams for proper oral hygiene  Community Resource Referral:  CRR required this visit?  No       Plan:  I have personally reviewed and addressed the Medicare Annual Wellness questionnaire and have noted the following in the patient's chart:  A. Medical and  social history B. Use of alcohol, tobacco or illicit drugs  C. Current medications and supplements D. Functional ability and status E.  Nutritional status F.  Physical activity G. Advance directives H. List of other physicians I.  Hospitalizations, surgeries, and ER visits in previous 12 months J.  [place] such as hearing and vision if needed, cognitive and depression L. Referrals and appointments   In addition, I have reviewed and discussed with patient certain preventive protocols, quality metrics, and best practice recommendations. A written personalized care plan for preventive services as well as general preventive health recommendations were provided to patient.  Due to this being a telephonic visit, the after visit summary with patients personalized plan was offered to patient via mail or my-chart. Patient declined at this time.  Signed,    Bevelyn Ngo, LPN  04/21/4626 Nurse Health Advisor   Nurse Notes: none

## 2019-08-27 DIAGNOSIS — N186 End stage renal disease: Secondary | ICD-10-CM | POA: Diagnosis not present

## 2019-08-27 DIAGNOSIS — Z992 Dependence on renal dialysis: Secondary | ICD-10-CM | POA: Diagnosis not present

## 2019-08-29 DIAGNOSIS — N186 End stage renal disease: Secondary | ICD-10-CM | POA: Diagnosis not present

## 2019-08-29 DIAGNOSIS — M810 Age-related osteoporosis without current pathological fracture: Secondary | ICD-10-CM | POA: Diagnosis not present

## 2019-08-29 DIAGNOSIS — I739 Peripheral vascular disease, unspecified: Secondary | ICD-10-CM | POA: Diagnosis not present

## 2019-08-30 DIAGNOSIS — N2581 Secondary hyperparathyroidism of renal origin: Secondary | ICD-10-CM | POA: Diagnosis not present

## 2019-08-30 DIAGNOSIS — Z992 Dependence on renal dialysis: Secondary | ICD-10-CM | POA: Diagnosis not present

## 2019-08-30 DIAGNOSIS — D509 Iron deficiency anemia, unspecified: Secondary | ICD-10-CM | POA: Diagnosis not present

## 2019-08-30 DIAGNOSIS — N186 End stage renal disease: Secondary | ICD-10-CM | POA: Diagnosis not present

## 2019-09-03 DIAGNOSIS — N186 End stage renal disease: Secondary | ICD-10-CM | POA: Diagnosis not present

## 2019-09-03 DIAGNOSIS — Z992 Dependence on renal dialysis: Secondary | ICD-10-CM | POA: Diagnosis not present

## 2019-09-06 DIAGNOSIS — Z992 Dependence on renal dialysis: Secondary | ICD-10-CM | POA: Diagnosis not present

## 2019-09-06 DIAGNOSIS — N186 End stage renal disease: Secondary | ICD-10-CM | POA: Diagnosis not present

## 2019-09-10 DIAGNOSIS — N186 End stage renal disease: Secondary | ICD-10-CM | POA: Diagnosis not present

## 2019-09-10 DIAGNOSIS — Z992 Dependence on renal dialysis: Secondary | ICD-10-CM | POA: Diagnosis not present

## 2019-09-13 DIAGNOSIS — Z992 Dependence on renal dialysis: Secondary | ICD-10-CM | POA: Diagnosis not present

## 2019-09-13 DIAGNOSIS — N186 End stage renal disease: Secondary | ICD-10-CM | POA: Diagnosis not present

## 2019-09-15 DIAGNOSIS — Z992 Dependence on renal dialysis: Secondary | ICD-10-CM | POA: Diagnosis not present

## 2019-09-15 DIAGNOSIS — N186 End stage renal disease: Secondary | ICD-10-CM | POA: Diagnosis not present

## 2019-09-17 DIAGNOSIS — N186 End stage renal disease: Secondary | ICD-10-CM | POA: Diagnosis not present

## 2019-09-17 DIAGNOSIS — Z992 Dependence on renal dialysis: Secondary | ICD-10-CM | POA: Diagnosis not present

## 2019-09-20 DIAGNOSIS — N186 End stage renal disease: Secondary | ICD-10-CM | POA: Diagnosis not present

## 2019-09-20 DIAGNOSIS — Z992 Dependence on renal dialysis: Secondary | ICD-10-CM | POA: Diagnosis not present

## 2019-09-24 DIAGNOSIS — N186 End stage renal disease: Secondary | ICD-10-CM | POA: Diagnosis not present

## 2019-09-24 DIAGNOSIS — Z992 Dependence on renal dialysis: Secondary | ICD-10-CM | POA: Diagnosis not present

## 2019-09-27 DIAGNOSIS — Z992 Dependence on renal dialysis: Secondary | ICD-10-CM | POA: Diagnosis not present

## 2019-09-27 DIAGNOSIS — N2581 Secondary hyperparathyroidism of renal origin: Secondary | ICD-10-CM | POA: Diagnosis not present

## 2019-09-27 DIAGNOSIS — N186 End stage renal disease: Secondary | ICD-10-CM | POA: Diagnosis not present

## 2019-09-27 DIAGNOSIS — D509 Iron deficiency anemia, unspecified: Secondary | ICD-10-CM | POA: Diagnosis not present

## 2019-09-28 DIAGNOSIS — N186 End stage renal disease: Secondary | ICD-10-CM | POA: Diagnosis not present

## 2019-09-28 DIAGNOSIS — I739 Peripheral vascular disease, unspecified: Secondary | ICD-10-CM | POA: Diagnosis not present

## 2019-09-28 DIAGNOSIS — M810 Age-related osteoporosis without current pathological fracture: Secondary | ICD-10-CM | POA: Diagnosis not present

## 2019-09-29 ENCOUNTER — Telehealth: Payer: Self-pay

## 2019-09-29 ENCOUNTER — Ambulatory Visit: Payer: Self-pay | Admitting: General Practice

## 2019-09-29 NOTE — Chronic Care Management (AMB) (Signed)
  Chronic Care Management   Outreach Note  09/29/2019 Name: Marissa Hess MRN: [id number] DOB: 1934-06-26  Referred by: Venita Lick, NP Reason for referral : Chronic Care Management (RNCM Follow up call: 2nd attempt: Chronic Disease Management and Care coordination needs )   A second unsuccessful telephone outreach was attempted today. The patient was referred to the case management team for assistance with care management and care coordination.   Follow Up Plan: A HIPPA compliant phone message was left for the patient providing contact information and requesting a return call.   Noreene Larsson RN, MSN, [facility] Mobile: [phone]

## 2019-10-01 DIAGNOSIS — Z992 Dependence on renal dialysis: Secondary | ICD-10-CM | POA: Diagnosis not present

## 2019-10-01 DIAGNOSIS — N186 End stage renal disease: Secondary | ICD-10-CM | POA: Diagnosis not present

## 2019-10-04 DIAGNOSIS — Z992 Dependence on renal dialysis: Secondary | ICD-10-CM | POA: Diagnosis not present

## 2019-10-04 DIAGNOSIS — N186 End stage renal disease: Secondary | ICD-10-CM | POA: Diagnosis not present

## 2019-10-08 DIAGNOSIS — N186 End stage renal disease: Secondary | ICD-10-CM | POA: Diagnosis not present

## 2019-10-08 DIAGNOSIS — Z992 Dependence on renal dialysis: Secondary | ICD-10-CM | POA: Diagnosis not present

## 2019-10-11 DIAGNOSIS — N186 End stage renal disease: Secondary | ICD-10-CM | POA: Diagnosis not present

## 2019-10-11 DIAGNOSIS — Z992 Dependence on renal dialysis: Secondary | ICD-10-CM | POA: Diagnosis not present

## 2019-10-15 DIAGNOSIS — Z992 Dependence on renal dialysis: Secondary | ICD-10-CM | POA: Diagnosis not present

## 2019-10-15 DIAGNOSIS — N186 End stage renal disease: Secondary | ICD-10-CM | POA: Diagnosis not present

## 2019-10-16 DIAGNOSIS — Z992 Dependence on renal dialysis: Secondary | ICD-10-CM | POA: Diagnosis not present

## 2019-10-16 DIAGNOSIS — N186 End stage renal disease: Secondary | ICD-10-CM | POA: Diagnosis not present

## 2019-10-18 DIAGNOSIS — N186 End stage renal disease: Secondary | ICD-10-CM | POA: Diagnosis not present

## 2019-10-18 DIAGNOSIS — Z992 Dependence on renal dialysis: Secondary | ICD-10-CM | POA: Diagnosis not present

## 2019-10-22 DIAGNOSIS — Z992 Dependence on renal dialysis: Secondary | ICD-10-CM | POA: Diagnosis not present

## 2019-10-22 DIAGNOSIS — N186 End stage renal disease: Secondary | ICD-10-CM | POA: Diagnosis not present

## 2019-10-25 DIAGNOSIS — Z992 Dependence on renal dialysis: Secondary | ICD-10-CM | POA: Diagnosis not present

## 2019-10-25 DIAGNOSIS — N2581 Secondary hyperparathyroidism of renal origin: Secondary | ICD-10-CM | POA: Diagnosis not present

## 2019-10-25 DIAGNOSIS — N186 End stage renal disease: Secondary | ICD-10-CM | POA: Diagnosis not present

## 2019-10-25 DIAGNOSIS — D509 Iron deficiency anemia, unspecified: Secondary | ICD-10-CM | POA: Diagnosis not present

## 2019-10-29 DIAGNOSIS — N186 End stage renal disease: Secondary | ICD-10-CM | POA: Diagnosis not present

## 2019-10-29 DIAGNOSIS — I739 Peripheral vascular disease, unspecified: Secondary | ICD-10-CM | POA: Diagnosis not present

## 2019-10-29 DIAGNOSIS — Z992 Dependence on renal dialysis: Secondary | ICD-10-CM | POA: Diagnosis not present

## 2019-10-29 DIAGNOSIS — M810 Age-related osteoporosis without current pathological fracture: Secondary | ICD-10-CM | POA: Diagnosis not present

## 2019-11-01 DIAGNOSIS — N186 End stage renal disease: Secondary | ICD-10-CM | POA: Diagnosis not present

## 2019-11-01 DIAGNOSIS — Z992 Dependence on renal dialysis: Secondary | ICD-10-CM | POA: Diagnosis not present

## 2019-11-02 ENCOUNTER — Telehealth: Payer: Self-pay

## 2019-11-03 DIAGNOSIS — H348322 Tributary (branch) retinal vein occlusion, left eye, stable: Secondary | ICD-10-CM | POA: Diagnosis not present

## 2019-11-05 DIAGNOSIS — Z992 Dependence on renal dialysis: Secondary | ICD-10-CM | POA: Diagnosis not present

## 2019-11-05 DIAGNOSIS — N186 End stage renal disease: Secondary | ICD-10-CM | POA: Diagnosis not present

## 2019-11-08 DIAGNOSIS — N186 End stage renal disease: Secondary | ICD-10-CM | POA: Diagnosis not present

## 2019-11-08 DIAGNOSIS — Z992 Dependence on renal dialysis: Secondary | ICD-10-CM | POA: Diagnosis not present

## 2019-11-12 ENCOUNTER — Other Ambulatory Visit: Payer: Self-pay

## 2019-11-12 DIAGNOSIS — N186 End stage renal disease: Secondary | ICD-10-CM | POA: Diagnosis not present

## 2019-11-12 DIAGNOSIS — Z992 Dependence on renal dialysis: Secondary | ICD-10-CM | POA: Diagnosis not present

## 2019-11-12 DIAGNOSIS — I10 Essential (primary) hypertension: Secondary | ICD-10-CM

## 2019-11-12 MED ORDER — CARVEDILOL 25 MG PO TABS: 25.0000 mg | ORAL_TABLET | Freq: Two times a day (BID) | ORAL | 4 refills | Status: AC

## 2019-11-12 NOTE — Telephone Encounter (Signed)
Patient requesting RX to be changed to a 90 day supply.

## 2019-11-15 DIAGNOSIS — Z992 Dependence on renal dialysis: Secondary | ICD-10-CM | POA: Diagnosis not present

## 2019-11-15 DIAGNOSIS — N186 End stage renal disease: Secondary | ICD-10-CM | POA: Diagnosis not present

## 2019-11-16 ENCOUNTER — Telehealth: Payer: Self-pay

## 2019-11-16 DIAGNOSIS — Z992 Dependence on renal dialysis: Secondary | ICD-10-CM | POA: Diagnosis not present

## 2019-11-16 DIAGNOSIS — N186 End stage renal disease: Secondary | ICD-10-CM | POA: Diagnosis not present

## 2019-11-19 DIAGNOSIS — Z992 Dependence on renal dialysis: Secondary | ICD-10-CM | POA: Diagnosis not present

## 2019-11-19 DIAGNOSIS — N186 End stage renal disease: Secondary | ICD-10-CM | POA: Diagnosis not present

## 2019-11-22 DIAGNOSIS — Z992 Dependence on renal dialysis: Secondary | ICD-10-CM | POA: Diagnosis not present

## 2019-11-22 DIAGNOSIS — N186 End stage renal disease: Secondary | ICD-10-CM | POA: Diagnosis not present

## 2019-11-26 ENCOUNTER — Telehealth: Payer: Self-pay | Admitting: General Practice

## 2019-11-26 ENCOUNTER — Ambulatory Visit (INDEPENDENT_AMBULATORY_CARE_PROVIDER_SITE_OTHER): Payer: Medicare Other | Admitting: General Practice

## 2019-11-26 ENCOUNTER — Telehealth: Payer: Self-pay

## 2019-11-26 DIAGNOSIS — E785 Hyperlipidemia, unspecified: Secondary | ICD-10-CM | POA: Diagnosis not present

## 2019-11-26 DIAGNOSIS — Z7409 Other reduced mobility: Secondary | ICD-10-CM

## 2019-11-26 DIAGNOSIS — N186 End stage renal disease: Secondary | ICD-10-CM | POA: Diagnosis not present

## 2019-11-26 DIAGNOSIS — Z992 Dependence on renal dialysis: Secondary | ICD-10-CM | POA: Diagnosis not present

## 2019-11-26 DIAGNOSIS — I10 Essential (primary) hypertension: Secondary | ICD-10-CM | POA: Diagnosis not present

## 2019-11-26 DIAGNOSIS — G2581 Restless legs syndrome: Secondary | ICD-10-CM

## 2019-11-26 DIAGNOSIS — F419 Anxiety disorder, unspecified: Secondary | ICD-10-CM

## 2019-11-26 NOTE — Patient Instructions (Signed)
Visit Information  Goals Addressed              This Visit's Progress   .  RNCM: "I get tired really easy" (pt-stated)        CARE PLAN ENTRY (see longtitudinal plan of care for additional care plan information)  Current Barriers:  . Chronic Disease Management support, education, and care coordination needs related to HLD, ESRD, Anxiety, and RLS  Clinical Goal(s) related to HLD, ESRD, Anxiety, and RLS :  Over the next 120 days, patient will:  . Work with the care management team to address educational, disease management, and care coordination needs  . Begin or continue self health monitoring activities as directed today  adhere to heart healthy/renal diet . Call provider office for new or worsened signs and symptoms Shortness of breath and New or worsened symptom related to RLS/HLD/Anxiety/ESRD and other chronic conditions . Call care management team with questions or concerns . Verbalize basic understanding of patient centered plan of care established today  Interventions related to HLD, ESRD, Anxiety, and RLS :  . Evaluation of current treatment plans and patient's adherence to plan as established by provider. 11-26-2019:The patient states that when she went to dialysis today the nurse told her she has some "rattling" in her chest. The patient verbalized that she has had "stopped up ears" and productive cough with sometimes "dark brown" sputum.  The patient says it has been going on for about 2 weeks.  In basket message to front office staff to see about getting the patient an appointment to come in and see the pcp next week. Education provided on worsening condition and if she experiences shob, fever, or other sx/sx to seek care are urgent care or ER.  The patient verbalized understanding.  . Assessed patient understanding of disease states.  The patient has a good understanding of her chronic conditions. Is taking dialysis on Monday's and Fridays now. 11-26-2019: Dialysis is going well.  Denies any concerns with dialysis.  . Assessed patient's education and care coordination needs.  The patient states she gets tired easily and can not clean her bathroom. Her husband helps her with the rest of the house but she needs assistance with her bathroom. Care guide referral for resources to help with cleaning. 11-26-2019: Her family is helping with cleaning at this time. The patient has her scooter and says this is a big help for her.  . Provided disease specific education to patient.  The patient is compliant with dietary restrictions, medications and following up with providers in a timely manner. Will continue to monitor for new concerns and needs. Nash Dimmer with appropriate clinical care team members regarding patient needs . Care guide referral for resources in [facility] for help in the home one day a week. The patient is aware that this may be an out of pocket expense and not covered by the insurance.   Patient Self Care Activities related to HLD, ESRD, Anxiety, and RLS :  . Patient is unable to independently self-manage chronic health conditions  Please see past updates related to this goal by clicking on the "Past Updates" button in the selected goal      .  COMPLETED: RNCM: "I need to find out the status of my chair" (pt-stated)        [place] (see longitudinal plan of care for additional care plan information)  Current Barriers:  Marland Kitchen Knowledge Deficits related to how to obtain needed DME- electric scooter .  Care Coordination needs related to decreased mobility in a patient with ESRD on dialysis, RLS, HLD, and anxiety (disease states)  Nurse Case Manager Clinical Goal(s):  Marland Kitchen Over the next 120 days, patient will verbalize understanding of plan for help with obtaining information on the status of needed DME . Over the next 120 days, patient will work with [facility] and PCP to address needs related to electric scooter . Over the next 120 days, patient will attend all  scheduled medical appointments: pcp visit today, then 4 week follow up . Over the next 120 days, patient will demonstrate improved health management independence as evidenced by remaining independent and having needed DME to remain independent in her home and community environment  . Over the next 120 days, the patient will demonstrate ongoing self health care management ability as evidenced by increased mobility and receiving needed scooter for increased mobility needs.   Interventions:  . Inter-disciplinary care team collaboration (see longitudinal plan of care) . Evaluation of current treatment plan related to chronic conditions and mobility needs and patient's adherence to plan as established by provider. . Advised patient to call the [facility] for any concerns related to the status of DME . Provided education to patient re: calling the DME company and talking to the staff concerning the patients electric scooter.  Call made to [employer] and talked with Otila Back at [phone] option [phone] and ext [phone].  Per Santiago Glad she received approval from [facility] today and is going to call the patient this afternoon and let her know she has been approved and arrange insurance and delivery. 11-26-2019: The patient has her scooter and it is working well for her. She is thankful that she has her scooter.  Marland Kitchen Collaborated with pcp and supply company  regarding order for DME and if they need additional information before processing DME.  Per Santiago Glad at [employer] no additional information required at this time to process the order.  . Discussed plans with patient for ongoing care management follow up and provided patient with direct contact information for care management team . Reviewed scheduled/upcoming provider appointments including: completed pcp appointment today and will follow up in 4 weeks with pcp after medication changes.   Patient Self Care Activities:  . Patient verbalizes understanding of plan to  receive information about electric scooter once the RNCM contacts the company to find out the status of the DME . Performs IADL's independently . Calls provider office for new concerns or questions . Unable to independently obtain needed DME   Please see past updates related to this goal by clicking on the "Past Updates" button in the selected goal         Patient verbalizes understanding of instructions provided today.   Telephone follow up appointment with care management team member scheduled for: 01-26-2020 at 2:15pm  Lead, MSN, [facility] Mobile: [phone]

## 2019-11-26 NOTE — Telephone Encounter (Cosign Needed)
  Chronic Care Management   Note  11/26/2019 Name: JOYCELIN RADLOFF MRN: [id number] DOB: Jan 18, 1935  Call back to the patient after consulting with the pcp about the patients stated symptoms. The pcp recommends urgent care and COVID testing. Gave the patient information from the pcp. The patient verbalized understanding. Will continue to monitor.   Follow up plan: Telephone follow up appointment with care management team member scheduled for: 01-26-2020 at 2:15 pm  [place], MSN, [facility] Mobile: [phone]

## 2019-11-26 NOTE — Chronic Care Management (AMB) (Signed)
Chronic Care Management   Follow Up Note   11/26/2019 Name: Marissa Hess MRN: [id number] DOB: [birth year]  Referred by: Venita Lick, NP Reason for referral : Chronic Care Management (RNCM: Chronic Disease Management and Care coordination Needs)   Marissa Hess is a 85 y.o. year old female who is a primary care patient of Cannady, Barbaraann Faster, NP. The CCM team was consulted for assistance with chronic disease management and care coordination needs.    Review of patient status, including review of consultants reports, relevant laboratory and other test results, and collaboration with appropriate care team members and the patient's provider was performed as part of comprehensive patient evaluation and provision of chronic care management services.    SDOH (Social Determinants of Health) assessments performed: Yes See Care Plan activities for detailed interventions related to [facility])     Outpatient Encounter Medications as of 11/26/2019  Medication Sig  . aspirin EC 81 MG tablet Take 81 mg by mouth daily.  Marland Kitchen azelastine (ASTELIN) 0.1 % nasal spray Place into both nostrils 2 (two) times daily. Place 1 spray into both nostril 2 (two) times daily. Use in each nostril as directed. Starting Thu 10/08/2018  . carvedilol (COREG) 25 MG tablet Take 1 tablet (25 mg total) by mouth 2 (two) times daily with a meal.  . cholecalciferol (VITAMIN D) 1000 units tablet Take 1,000 Units by mouth daily.   . ferrous sulfate (SLOW RELEASE IRON) 160 (50 Fe) MG TBCR SR tablet Take 1 tablet by mouth daily.   . folic acid (FOLVITE) 1 MG tablet Take 1 tablet (1 mg total) by mouth daily.  Marland Kitchen rOPINIRole (REQUIP) 0.25 MG tablet Take 1 tablet (0.25 mg total) by mouth 2 (two) times daily.   No facility-administered encounter medications on file as of 11/26/2019.     Objective:  BP Readings from Last 3 Encounters:  08/11/19 124/72  08/03/19 117/74  07/06/19 127/81    Goals Addressed              This Visit's  Progress   .  RNCM: "I get tired really easy" (pt-stated)        CARE PLAN ENTRY (see longtitudinal plan of care for additional care plan information)  Current Barriers:  . Chronic Disease Management support, education, and care coordination needs related to HLD, ESRD, Anxiety, and RLS  Clinical Goal(s) related to HLD, ESRD, Anxiety, and RLS :  Over the next 120 days, patient will:  . Work with the care management team to address educational, disease management, and care coordination needs  . Begin or continue self health monitoring activities as directed today  adhere to heart healthy/renal diet . Call provider office for new or worsened signs and symptoms Shortness of breath and New or worsened symptom related to RLS/HLD/Anxiety/ESRD and other chronic conditions . Call care management team with questions or concerns . Verbalize basic understanding of patient centered plan of care established today  Interventions related to HLD, ESRD, Anxiety, and RLS :  . Evaluation of current treatment plans and patient's adherence to plan as established by provider. 11-26-2019:The patient states that when she went to dialysis today the nurse told her she has some "rattling" in her chest. The patient verbalized that she has had "stopped up ears" and productive cough with sometimes "dark brown" sputum.  The patient says it has been going on for about 2 weeks.  In basket message to front office staff to see about getting the patient an appointment  to come in and see the pcp next week. Education provided on worsening condition and if she experiences shob, fever, or other sx/sx to seek care are urgent care or ER.  The patient verbalized understanding.  . Assessed patient understanding of disease states.  The patient has a good understanding of her chronic conditions. Is taking dialysis on Monday's and Fridays now. 11-26-2019: Dialysis is going well. Denies any concerns with dialysis.  . Assessed patient's education  and care coordination needs.  The patient states she gets tired easily and can not clean her bathroom. Her husband helps her with the rest of the house but she needs assistance with her bathroom. Care guide referral for resources to help with cleaning. 11-26-2019: Her family is helping with cleaning at this time. The patient has her scooter and says this is a big help for her.  . Provided disease specific education to patient.  The patient is compliant with dietary restrictions, medications and following up with providers in a timely manner. Will continue to monitor for new concerns and needs. Nash Dimmer with appropriate clinical care team members regarding patient needs . Care guide referral for resources in [facility] for help in the home one day a week. The patient is aware that this may be an out of pocket expense and not covered by the insurance.   Patient Self Care Activities related to HLD, ESRD, Anxiety, and RLS :  . Patient is unable to independently self-manage chronic health conditions  Please see past updates related to this goal by clicking on the "Past Updates" button in the selected goal      .  COMPLETED: RNCM: "I need to find out the status of my chair" (pt-stated)        [place]uck (see longitudinal plan of care for additional care plan information)  Current Barriers:  Marland Kitchen Knowledge Deficits related to how to obtain needed DME- electric scooter . Care Coordination needs related to decreased mobility in a patient with ESRD on dialysis, RLS, HLD, and anxiety (disease states)  Nurse Case Manager Clinical Goal(s):  Marland Kitchen Over the next 120 days, patient will verbalize understanding of plan for help with obtaining information on the status of needed DME . Over the next 120 days, patient will work with [facility] and PCP to address needs related to electric scooter . Over the next 120 days, patient will attend all scheduled medical appointments: pcp visit today, then 4 week follow  up . Over the next 120 days, patient will demonstrate improved health management independence as evidenced by remaining independent and having needed DME to remain independent in her home and community environment  . Over the next 120 days, the patient will demonstrate ongoing self health care management ability as evidenced by increased mobility and receiving needed scooter for increased mobility needs.   Interventions:  . Inter-disciplinary care team collaboration (see longitudinal plan of care) . Evaluation of current treatment plan related to chronic conditions and mobility needs and patient's adherence to plan as established by provider. . Advised patient to call the [facility] for any concerns related to the status of DME . Provided education to patient re: calling the DME company and talking to the staff concerning the patients electric scooter.  Call made to [employer] and talked with Otila Back at [phone] option [phone] and ext [phone].  Per Santiago Glad she received approval from [employer] today and is going to call the patient this afternoon and let her know she has  been approved and arrange insurance and delivery. 11-26-2019: The patient has her scooter and it is working well for her. She is thankful that she has her scooter.  Marland Kitchen Collaborated with pcp and supply company  regarding order for DME and if they need additional information before processing DME.  Per Santiago Glad at [employer] no additional information required at this time to process the order.  . Discussed plans with patient for ongoing care management follow up and provided patient with direct contact information for care management team . Reviewed scheduled/upcoming provider appointments including: completed pcp appointment today and will follow up in 4 weeks with pcp after medication changes.   Patient Self Care Activities:  . Patient verbalizes understanding of plan to receive information about electric scooter once the RNCM contacts  the company to find out the status of the DME . Performs IADL's independently . Calls provider office for new concerns or questions . Unable to independently obtain needed DME   Please see past updates related to this goal by clicking on the "Past Updates" button in the selected goal          Plan:   Telephone follow up appointment with care management team member scheduled for: 01-26-2020 at 2:15pm   [place], MSN, [facility] Mobile: [phone]

## 2019-11-29 ENCOUNTER — Ambulatory Visit (INDEPENDENT_AMBULATORY_CARE_PROVIDER_SITE_OTHER): Payer: Medicare Other | Admitting: Family Medicine

## 2019-11-29 ENCOUNTER — Encounter: Payer: Self-pay | Admitting: Family Medicine

## 2019-11-29 DIAGNOSIS — J301 Allergic rhinitis due to pollen: Secondary | ICD-10-CM | POA: Diagnosis not present

## 2019-11-29 DIAGNOSIS — N2581 Secondary hyperparathyroidism of renal origin: Secondary | ICD-10-CM | POA: Diagnosis not present

## 2019-11-29 DIAGNOSIS — Z992 Dependence on renal dialysis: Secondary | ICD-10-CM | POA: Diagnosis not present

## 2019-11-29 DIAGNOSIS — I739 Peripheral vascular disease, unspecified: Secondary | ICD-10-CM | POA: Diagnosis not present

## 2019-11-29 DIAGNOSIS — M810 Age-related osteoporosis without current pathological fracture: Secondary | ICD-10-CM | POA: Diagnosis not present

## 2019-11-29 DIAGNOSIS — N186 End stage renal disease: Secondary | ICD-10-CM | POA: Diagnosis not present

## 2019-11-29 MED ORDER — LEVOCETIRIZINE DIHYDROCHLORIDE 5 MG PO TABS: 5.0000 mg | ORAL_TABLET | Freq: Every evening | ORAL | 3 refills | Status: DC

## 2019-11-29 MED ORDER — PREDNISONE 20 MG PO TABS
20.0000 mg | ORAL_TABLET | Freq: Every day | ORAL | 0 refills | Status: DC
Start: 2019-11-29 — End: 2019-12-09

## 2019-11-29 NOTE — Assessment & Plan Note (Signed)
Will treat with xyzal and short burst of prednisone. Call if not better by Thursday and will get COVID swab. Continue to monitor. Recheck 1 week.

## 2019-11-29 NOTE — Progress Notes (Signed)
There were no vitals taken for this visit.   Subjective:    Patient ID: Marissa Hess, female    DOB: 05/29/1934, 85 y.o.   MRN: [id number]  HPI: Marissa Hess is a 85 y.o. female  Chief Complaint  Patient presents with  . Cough    productive. x about 2 weeks  . Wheezing  . Ear Problem    stopped up ears   UPPER RESPIRATORY TRACT INFECTION Duration: 2 weeks Worst symptom: mucous in her throat Fever: no Cough: no Shortness of breath: yes Wheezing: yes Chest pain: no Chest tightness: no Chest congestion: no Nasal congestion: no Runny nose: yes Post nasal drip: no Sneezing: no Sore throat: no Swollen glands: no Sinus pressure: no Headache: no Face pain: no Toothache: no Ear pain: no  Ear pressure: yes bilateral Eyes red/itching:no Eye drainage/crusting: no  Vomiting: no Rash: no Fatigue: no Sick contacts: no Strep contacts: no  Context: fluctuating Recurrent sinusitis: no Relief with OTC cold/cough medications: no  Treatments attempted: cold/sinus, mucinex, anti-histamine, pseudoephedrine and cough syrup   Relevant past medical, surgical, family and social history reviewed and updated as indicated. Interim medical history since our last visit reviewed. Allergies and medications reviewed and updated.  Review of Systems  Constitutional: Negative for activity change, appetite change, chills, diaphoresis, fatigue, fever and unexpected weight change.  HENT: Positive for congestion, postnasal drip, rhinorrhea and sinus pressure. Negative for dental problem, drooling, ear discharge, ear pain, facial swelling, hearing loss, mouth sores, nosebleeds, sinus pain, sneezing, sore throat, tinnitus, trouble swallowing and voice change.   Eyes: Negative.   Respiratory: Negative.   Cardiovascular: Negative.   Gastrointestinal: Negative.   Psychiatric/Behavioral: Negative.     Per HPI unless specifically indicated above     Objective:    There were no vitals taken  for this visit.  Wt Readings from Last 3 Encounters:  08/25/19 132 lb (59.9 kg)  08/11/19 131 lb 12.8 oz (59.8 kg)  08/03/19 129 lb (58.5 kg)    Physical Exam Vitals and nursing note reviewed.  Pulmonary:     Effort: Pulmonary effort is normal. No respiratory distress.     Comments: Speaking in full sentences Neurological:     Mental Status: She is alert.  Psychiatric:        Mood and Affect: Mood normal.        Behavior: Behavior normal.        Thought Content: Thought content normal.        Judgment: Judgment normal.     Results for orders placed or performed in visit on 11/26/18  Microscopic Examination   URINE  Result Value Ref Range   WBC, UA 6-10 (A) 0 - 5 /hpf   RBC 0-2 0 - 2 /hpf   Epithelial Cells (non renal) 0-10 0 - 10 /hpf   Casts Present None seen /lpf   Cast Type Hyaline casts N/A   Bacteria, UA Few (A) None seen/Few  Urine Culture, Reflex   URINE  Result Value Ref Range   Urine Culture, Routine Final report    Organism ID, Bacteria Comment   CBC with Differential/Platelet  Result Value Ref Range   WBC 5.1 3.4 - 10.8 x10E3/uL   RBC 3.91 3.77 - 5.28 x10E6/uL   Hemoglobin 11.2 11.1 - 15.9 g/dL   Hematocrit 34.4 34.0 - 46.6 %   MCV 88 79 - 97 fL   MCH 28.6 26.6 - 33.0 pg   MCHC 32.6 31 - 35  g/dL   RDW 14.9 11.7 - 15.4 %   Platelets 207 150 - 450 x10E3/uL   Neutrophils 63 Not Estab. %   Lymphs 21 Not Estab. %   Monocytes 12 Not Estab. %   Eos 3 Not Estab. %   Basos 1 Not Estab. %   Neutrophils Absolute 3.2 1 - 7 x10E3/uL   Lymphocytes Absolute 1.1 0 - 3 x10E3/uL   Monocytes Absolute 0.6 0 - 0 x10E3/uL   EOS (ABSOLUTE) 0.1 0.0 - 0.4 x10E3/uL   Basophils Absolute 0.1 0 - 0 x10E3/uL   Immature Granulocytes 0 Not Estab. %   Immature Grans (Abs) 0.0 0.0 - 0.1 x10E3/uL  Comprehensive metabolic panel  Result Value Ref Range   Glucose 72 65 - 99 mg/dL   BUN 33 (H) 8 - 27 mg/dL   Creatinine, Ser 2.48 (H) 0.57 - 1.00 mg/dL   GFR calc non Af Amer 18 (L)  >59 mL/min/1.73   GFR calc Af Amer 20 (L) >59 mL/min/1.73   BUN/Creatinine Ratio 13 12 - 28   Sodium 140 134 - 144 mmol/L   Potassium 3.7 3.5 - 5.2 mmol/L   Chloride 98 96 - 106 mmol/L   CO2 28 20 - 29 mmol/L   Calcium 8.8 8.7 - 10.3 mg/dL   Total Protein 6.9 6.0 - 8.5 g/dL   Albumin 3.4 (L) 3.6 - 4.6 g/dL   Globulin, Total 3.5 1.5 - 4.5 g/dL   Albumin/Globulin Ratio 1.0 (L) 1.2 - 2.2   Bilirubin Total 0.5 0.0 - 1.2 mg/dL   Alkaline Phosphatase 78 39 - 117 IU/L   AST 11 0 - 40 IU/L   ALT 7 0 - 32 IU/L  Lipid Panel w/o Chol/HDL Ratio  Result Value Ref Range   Cholesterol, Total 187 100 - 199 mg/dL   Triglycerides 110 0 - 149 mg/dL   HDL 51 >39 mg/dL   VLDL Cholesterol Cal 20 5 - 40 mg/dL   LDL Chol Calc (NIH) 116 (H) 0 - 99 mg/dL  TSH  Result Value Ref Range   TSH 3.570 0.450 - 4.500 uIU/mL  UA/M w/rflx Culture, Routine   Specimen: Urine   URINE  Result Value Ref Range   Specific Gravity, UA 1.015 1.005 - 1.030   pH, UA 7.5 5.0 - 7.5   Color, UA Yellow Yellow   Appearance Ur Clear Clear   Leukocytes,UA 1+ (A) Negative   Protein,UA 2+ (A) Negative/Trace   Glucose, UA Negative Negative   Ketones, UA Negative Negative   RBC, UA Trace (A) Negative   Bilirubin, UA Negative Negative   Urobilinogen, Ur 0.2 0.2 - 1.0 mg/dL   Nitrite, UA Negative Negative   Microscopic Examination See below:    Urinalysis Reflex Comment       Assessment & Plan:   Problem List Items Addressed This Visit      Respiratory   Allergic rhinitis - Primary    Will treat with xyzal and short burst of prednisone. Call if not better by Thursday and will get COVID swab. Continue to monitor. Recheck 1 week.          Follow up plan: Return in about 1 week (around 12/06/2019) for virtual recheck.   . This visit was completed via telephone due to the restrictions of the COVID-19 pandemic. All issues as above were discussed and addressed but no physical exam was performed. If it was felt that the  patient should be evaluated in the office, they were directed  there. The patient verbally consented to this visit. Patient was unable to complete an audio/visual visit due to Lack of equipment. Due to the catastrophic nature of the COVID-19 pandemic, this visit was done through audio contact only. . Location of the patient: home . Location of the provider: work . Those involved with this call:  . Provider: Park Liter, DO . CMA: Lesle Chris, [place] . Front Desk/Registration: Don Perking  . Time spent on call: 21 minutes on the phone discussing health concerns. 30 minutes total spent in review of patient's record and preparation of their chart.

## 2019-12-03 DIAGNOSIS — Z992 Dependence on renal dialysis: Secondary | ICD-10-CM | POA: Diagnosis not present

## 2019-12-03 DIAGNOSIS — N186 End stage renal disease: Secondary | ICD-10-CM | POA: Diagnosis not present

## 2019-12-06 DIAGNOSIS — Z992 Dependence on renal dialysis: Secondary | ICD-10-CM | POA: Diagnosis not present

## 2019-12-06 DIAGNOSIS — N186 End stage renal disease: Secondary | ICD-10-CM | POA: Diagnosis not present

## 2019-12-09 ENCOUNTER — Ambulatory Visit (INDEPENDENT_AMBULATORY_CARE_PROVIDER_SITE_OTHER): Payer: Medicare Other | Admitting: Family Medicine

## 2019-12-09 ENCOUNTER — Encounter: Payer: Self-pay | Admitting: Family Medicine

## 2019-12-09 DIAGNOSIS — M3214 Glomerular disease in systemic lupus erythematosus: Secondary | ICD-10-CM

## 2019-12-09 DIAGNOSIS — J301 Allergic rhinitis due to pollen: Secondary | ICD-10-CM

## 2019-12-09 MED ORDER — FOLIC ACID 1 MG PO TABS: 1.0000 mg | ORAL_TABLET | Freq: Every day | ORAL | 3 refills | Status: AC

## 2019-12-09 NOTE — Progress Notes (Signed)
There were no vitals taken for this visit.   Subjective:    Patient ID: Marissa Hess, female    DOB: Aug 02, 1934, 85 y.o.   MRN: [id number]  HPI: Marissa Hess is a 85 y.o. female  Chief Complaint  Patient presents with  . Allergies    1w f/u   Allergies are much better. Prednisone treated her OK. No more SOB, no stuffy nose. No wheezing. No fevers. Feeling back to herself. No other concerns or complaints at this time.   Relevant past medical, surgical, family and social history reviewed and updated as indicated. Interim medical history since our last visit reviewed. Allergies and medications reviewed and updated.  Review of Systems  Constitutional: Negative.   Respiratory: Negative.   Cardiovascular: Negative.   Gastrointestinal: Negative.   Musculoskeletal: Negative.   Neurological:       Lower legs weakness  Psychiatric/Behavioral: Negative.     Per HPI unless specifically indicated above     Objective:    There were no vitals taken for this visit.  Wt Readings from Last 3 Encounters:  08/25/19 132 lb (59.9 kg)  08/11/19 131 lb 12.8 oz (59.8 kg)  08/03/19 129 lb (58.5 kg)    Physical Exam Vitals and nursing note reviewed.  Pulmonary:     Effort: Pulmonary effort is normal. No respiratory distress.     Comments: Speaking in full sentences Neurological:     Mental Status: She is alert.  Psychiatric:        Mood and Affect: Mood normal.        Behavior: Behavior normal.        Thought Content: Thought content normal.        Judgment: Judgment normal.     Results for orders placed or performed in visit on 11/26/18  Microscopic Examination   URINE  Result Value Ref Range   WBC, UA 6-10 (A) 0 - 5 /hpf   RBC 0-2 0 - 2 /hpf   Epithelial Cells (non renal) 0-10 0 - 10 /hpf   Casts Present None seen /lpf   Cast Type Hyaline casts N/A   Bacteria, UA Few (A) None seen/Few  Urine Culture, Reflex   URINE  Result Value Ref Range   Urine Culture, Routine  Final report    Organism ID, Bacteria Comment   CBC with Differential/Platelet  Result Value Ref Range   WBC 5.1 3.4 - 10.8 x10E3/uL   RBC 3.91 3.77 - 5.28 x10E6/uL   Hemoglobin 11.2 11.1 - 15.9 g/dL   Hematocrit 34.4 34.0 - 46.6 %   MCV 88 79 - 97 fL   MCH 28.6 26.6 - 33.0 pg   MCHC 32.6 31 - 35 g/dL   RDW 14.9 11.7 - 15.4 %   Platelets 207 150 - 450 x10E3/uL   Neutrophils 63 Not Estab. %   Lymphs 21 Not Estab. %   Monocytes 12 Not Estab. %   Eos 3 Not Estab. %   Basos 1 Not Estab. %   Neutrophils Absolute 3.2 1 - 7 x10E3/uL   Lymphocytes Absolute 1.1 0 - 3 x10E3/uL   Monocytes Absolute 0.6 0 - 0 x10E3/uL   EOS (ABSOLUTE) 0.1 0.0 - 0.4 x10E3/uL   Basophils Absolute 0.1 0 - 0 x10E3/uL   Immature Granulocytes 0 Not Estab. %   Immature Grans (Abs) 0.0 0.0 - 0.1 x10E3/uL  Comprehensive metabolic panel  Result Value Ref Range   Glucose 72 65 - 99 mg/dL  BUN 33 (H) 8 - 27 mg/dL   Creatinine, Ser 2.48 (H) 0.57 - 1.00 mg/dL   GFR calc non Af Amer 18 (L) >59 mL/min/1.73   GFR calc Af Amer 20 (L) >59 mL/min/1.73   BUN/Creatinine Ratio 13 12 - 28   Sodium 140 134 - 144 mmol/L   Potassium 3.7 3.5 - 5.2 mmol/L   Chloride 98 96 - 106 mmol/L   CO2 28 20 - 29 mmol/L   Calcium 8.8 8.7 - 10.3 mg/dL   Total Protein 6.9 6.0 - 8.5 g/dL   Albumin 3.4 (L) 3.6 - 4.6 g/dL   Globulin, Total 3.5 1.5 - 4.5 g/dL   Albumin/Globulin Ratio 1.0 (L) 1.2 - 2.2   Bilirubin Total 0.5 0.0 - 1.2 mg/dL   Alkaline Phosphatase 78 39 - 117 IU/L   AST 11 0 - 40 IU/L   ALT 7 0 - 32 IU/L  Lipid Panel w/o Chol/HDL Ratio  Result Value Ref Range   Cholesterol, Total 187 100 - 199 mg/dL   Triglycerides 110 0 - 149 mg/dL   HDL 51 >39 mg/dL   VLDL Cholesterol Cal 20 5 - 40 mg/dL   LDL Chol Calc (NIH) 116 (H) 0 - 99 mg/dL  TSH  Result Value Ref Range   TSH 3.570 0.450 - 4.500 uIU/mL  UA/M w/rflx Culture, Routine   Specimen: Urine   URINE  Result Value Ref Range   Specific Gravity, UA 1.015 1.005 - 1.030    pH, UA 7.5 5.0 - 7.5   Color, UA Yellow Yellow   Appearance Ur Clear Clear   Leukocytes,UA 1+ (A) Negative   Protein,UA 2+ (A) Negative/Trace   Glucose, UA Negative Negative   Ketones, UA Negative Negative   RBC, UA Trace (A) Negative   Bilirubin, UA Negative Negative   Urobilinogen, Ur 0.2 0.2 - 1.0 mg/dL   Nitrite, UA Negative Negative   Microscopic Examination See below:    Urinalysis Reflex Comment       Assessment & Plan:   Problem List Items Addressed This Visit      Respiratory   Allergic rhinitis - Primary    Resolved. Feeling better. Continue to monitor. Call with any concerns.        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   . This visit was completed via telephone due to the restrictions of the COVID-19 pandemic. All issues as above were discussed and addressed but no physical exam was performed. If it was felt that the patient should be evaluated in the office, they were directed there. The patient verbally consented to this visit. Patient was unable to complete an audio/visual visit due to Lack of equipment. Due to the catastrophic nature of the COVID-19 pandemic, this visit was done through audio contact only. . Location of the patient: home . Location of the provider: work . Those involved with this call:  . Provider: Park Liter, DO . CMA: Lesle Chris, [place] . Front Desk/Registration: Don Perking  . Time spent on call: 15 minutes on the phone discussing health concerns. 23 minutes total spent in review of patient's record and preparation of their chart.

## 2019-12-09 NOTE — Assessment & Plan Note (Signed)
Resolved. Feeling better. Continue to monitor. Call with any concerns.

## 2019-12-10 DIAGNOSIS — N186 End stage renal disease: Secondary | ICD-10-CM | POA: Diagnosis not present

## 2019-12-10 DIAGNOSIS — Z992 Dependence on renal dialysis: Secondary | ICD-10-CM | POA: Diagnosis not present

## 2019-12-13 DIAGNOSIS — N2581 Secondary hyperparathyroidism of renal origin: Secondary | ICD-10-CM | POA: Diagnosis not present

## 2019-12-13 DIAGNOSIS — Z992 Dependence on renal dialysis: Secondary | ICD-10-CM | POA: Diagnosis not present

## 2019-12-13 DIAGNOSIS — N186 End stage renal disease: Secondary | ICD-10-CM | POA: Diagnosis not present

## 2019-12-16 DIAGNOSIS — Z992 Dependence on renal dialysis: Secondary | ICD-10-CM | POA: Diagnosis not present

## 2019-12-16 DIAGNOSIS — N186 End stage renal disease: Secondary | ICD-10-CM | POA: Diagnosis not present

## 2019-12-17 DIAGNOSIS — N186 End stage renal disease: Secondary | ICD-10-CM | POA: Diagnosis not present

## 2019-12-17 DIAGNOSIS — Z992 Dependence on renal dialysis: Secondary | ICD-10-CM | POA: Diagnosis not present

## 2019-12-20 DIAGNOSIS — N186 End stage renal disease: Secondary | ICD-10-CM | POA: Diagnosis not present

## 2019-12-20 DIAGNOSIS — Z992 Dependence on renal dialysis: Secondary | ICD-10-CM | POA: Diagnosis not present

## 2019-12-24 DIAGNOSIS — N186 End stage renal disease: Secondary | ICD-10-CM | POA: Diagnosis not present

## 2019-12-24 DIAGNOSIS — Z992 Dependence on renal dialysis: Secondary | ICD-10-CM | POA: Diagnosis not present

## 2019-12-27 DIAGNOSIS — N2581 Secondary hyperparathyroidism of renal origin: Secondary | ICD-10-CM | POA: Diagnosis not present

## 2019-12-27 DIAGNOSIS — D509 Iron deficiency anemia, unspecified: Secondary | ICD-10-CM | POA: Diagnosis not present

## 2019-12-27 DIAGNOSIS — N186 End stage renal disease: Secondary | ICD-10-CM | POA: Diagnosis not present

## 2019-12-27 DIAGNOSIS — Z992 Dependence on renal dialysis: Secondary | ICD-10-CM | POA: Diagnosis not present

## 2019-12-29 DIAGNOSIS — N186 End stage renal disease: Secondary | ICD-10-CM | POA: Diagnosis not present

## 2019-12-29 DIAGNOSIS — I739 Peripheral vascular disease, unspecified: Secondary | ICD-10-CM | POA: Diagnosis not present

## 2019-12-29 DIAGNOSIS — M810 Age-related osteoporosis without current pathological fracture: Secondary | ICD-10-CM | POA: Diagnosis not present

## 2019-12-31 DIAGNOSIS — Z992 Dependence on renal dialysis: Secondary | ICD-10-CM | POA: Diagnosis not present

## 2019-12-31 DIAGNOSIS — N186 End stage renal disease: Secondary | ICD-10-CM | POA: Diagnosis not present

## 2020-01-03 DIAGNOSIS — Z992 Dependence on renal dialysis: Secondary | ICD-10-CM | POA: Diagnosis not present

## 2020-01-03 DIAGNOSIS — N186 End stage renal disease: Secondary | ICD-10-CM | POA: Diagnosis not present

## 2020-01-07 DIAGNOSIS — N186 End stage renal disease: Secondary | ICD-10-CM | POA: Diagnosis not present

## 2020-01-07 DIAGNOSIS — Z992 Dependence on renal dialysis: Secondary | ICD-10-CM | POA: Diagnosis not present

## 2020-01-10 DIAGNOSIS — N2581 Secondary hyperparathyroidism of renal origin: Secondary | ICD-10-CM | POA: Diagnosis not present

## 2020-01-10 DIAGNOSIS — Z992 Dependence on renal dialysis: Secondary | ICD-10-CM | POA: Diagnosis not present

## 2020-01-10 DIAGNOSIS — N186 End stage renal disease: Secondary | ICD-10-CM | POA: Diagnosis not present

## 2020-01-11 ENCOUNTER — Ambulatory Visit (INDEPENDENT_AMBULATORY_CARE_PROVIDER_SITE_OTHER): Payer: Medicare Other | Admitting: Nurse Practitioner

## 2020-01-11 ENCOUNTER — Encounter: Payer: Self-pay | Admitting: Nurse Practitioner

## 2020-01-11 ENCOUNTER — Emergency Department: Payer: Medicare Other

## 2020-01-11 ENCOUNTER — Other Ambulatory Visit: Payer: Self-pay

## 2020-01-11 ENCOUNTER — Ambulatory Visit: Payer: Self-pay

## 2020-01-11 ENCOUNTER — Encounter: Payer: Self-pay | Admitting: *Deleted

## 2020-01-11 ENCOUNTER — Emergency Department
Admission: EM | Admit: 2020-01-11 | Discharge: 2020-01-11 | Disposition: A | Discharge status: Home / Self Care | Payer: Medicare Other | Attending: Emergency Medicine | Admitting: Emergency Medicine

## 2020-01-11 VITALS — BP 145/80 | HR 73 | Temp 97.7°F | Ht 63.0 in | Wt 130.4 lb

## 2020-01-11 DIAGNOSIS — M1611 Unilateral primary osteoarthritis, right hip: Secondary | ICD-10-CM | POA: Diagnosis not present

## 2020-01-11 DIAGNOSIS — Z23 Encounter for immunization: Secondary | ICD-10-CM

## 2020-01-11 DIAGNOSIS — M549 Dorsalgia, unspecified: Secondary | ICD-10-CM | POA: Diagnosis not present

## 2020-01-11 DIAGNOSIS — N186 End stage renal disease: Secondary | ICD-10-CM | POA: Diagnosis not present

## 2020-01-11 DIAGNOSIS — Y92531 Health care provider office as the place of occurrence of the external cause: Secondary | ICD-10-CM | POA: Insufficient documentation

## 2020-01-11 DIAGNOSIS — I672 Cerebral atherosclerosis: Secondary | ICD-10-CM | POA: Diagnosis not present

## 2020-01-11 DIAGNOSIS — J984 Other disorders of lung: Secondary | ICD-10-CM | POA: Diagnosis not present

## 2020-01-11 DIAGNOSIS — W19XXXA Unspecified fall, initial encounter: Secondary | ICD-10-CM | POA: Insufficient documentation

## 2020-01-11 DIAGNOSIS — Z5321 Procedure and treatment not carried out due to patient leaving prior to being seen by health care provider: Secondary | ICD-10-CM | POA: Diagnosis not present

## 2020-01-11 DIAGNOSIS — Z992 Dependence on renal dialysis: Secondary | ICD-10-CM

## 2020-01-11 DIAGNOSIS — I6529 Occlusion and stenosis of unspecified carotid artery: Secondary | ICD-10-CM | POA: Diagnosis not present

## 2020-01-11 DIAGNOSIS — R519 Headache, unspecified: Secondary | ICD-10-CM | POA: Insufficient documentation

## 2020-01-11 DIAGNOSIS — G2581 Restless legs syndrome: Secondary | ICD-10-CM

## 2020-01-11 DIAGNOSIS — M25551 Pain in right hip: Secondary | ICD-10-CM | POA: Insufficient documentation

## 2020-01-11 DIAGNOSIS — J3489 Other specified disorders of nose and nasal sinuses: Secondary | ICD-10-CM | POA: Diagnosis not present

## 2020-01-11 DIAGNOSIS — I129 Hypertensive chronic kidney disease with stage 1 through stage 4 chronic kidney disease, or unspecified chronic kidney disease: Secondary | ICD-10-CM

## 2020-01-11 DIAGNOSIS — I739 Peripheral vascular disease, unspecified: Secondary | ICD-10-CM | POA: Diagnosis not present

## 2020-01-11 DIAGNOSIS — M2548 Effusion, other site: Secondary | ICD-10-CM | POA: Diagnosis not present

## 2020-01-11 DIAGNOSIS — M47816 Spondylosis without myelopathy or radiculopathy, lumbar region: Secondary | ICD-10-CM | POA: Diagnosis not present

## 2020-01-11 MED ORDER — ROPINIROLE HCL 0.5 MG PO TABS: ORAL_TABLET | ORAL | 4 refills | Status: AC

## 2020-01-11 NOTE — Assessment & Plan Note (Signed)
Chronic, ongoing with ESRD.  Continue collaboration with nephrology and current medication regimen. BP at goal for her age today.  Recent labs with dialysis.

## 2020-01-11 NOTE — Telephone Encounter (Signed)
Please let her know I would like her to go to urgent care today, I know she wanted to avoid ER, but feel imaging would be beneficial.  I do want her to be seen though for this, this afternoon.  She did not land on hip with fall, but better to get this looked at.

## 2020-01-11 NOTE — Progress Notes (Signed)
BP (!) 145/80 (BP Location: Left Arm, Patient Position: Sitting, Cuff Size: Normal)   Pulse 73   Temp 97.7 F (36.5 C) (Oral)   Ht 5\' 3"  (1.6 m)   Wt 130 lb 6.4 oz (59.1 kg)   BMI 23.10 kg/m    Subjective:    Patient ID: Marissa Hess, female    DOB: [birth year], 85 y.o.   MRN: [id number]  HPI: Marissa Hess is a 85 y.o. female  Chief Complaint  Patient presents with  . Chronic Kidney Disease   HYPERTENSION Continues on Carvedilol and ASA.   Hypertension status: stable  Satisfied with current treatment? yes Duration of hypertension: chronic BP monitoring frequency:  rarely BP range:  BP medication side effects:  no Medication compliance: good compliance Aspirin: yes Recurrent headaches: no Visual changes: no Palpitations: no Dyspnea: no Chest pain: no Lower extremity edema: no Dizzy/lightheaded: no  CHRONIC KIDNEY DISEASE Followed by nephrology, Dr. Holley Raring -- she reports seeing him last week.  Is undergoing dialysis under Monday and Friday.  Has labs performed at dialysis center frequently, drew blood yesterday.  Attends [employer], no recent labs in [place]. CKD status: stable Medications renally dose: yes Previous renal evaluation: yes Pneumovax:  Not up to Date Influenza Vaccine:  Up to Date   RESTLESS LEGS She stopped taking Requip, which worked well for [employer] but then stopped working as well, so she stopped taking. Duration: chronic Discomfort description:  ill-defined, burning and cramping Pain: yes Location: lower legs Bilateral: yes Symmetric: yes Severity: 6/10 Onset:  gradual Frequency:  intermittent Symptoms only occur while legs at rest: yes Sudden unintentional leg jerking: no Bed partner bothered by leg movements: no LE numbness: no Decreased sensation: no Weakness: no Insomnia: yes with discomfort Daytime somnolence: no Fatigue: no Alleviating factors: nothing Aggravating factors: resting at night Status: fluctuating Treatments  attempted: Gabapentin and Tylenol  FALL IN OFFICE: While stepping up to exam table, foot caught on edge and patient fell backwards.  Provider was able to catch and patient gently fell backwards onto floor, gently hitting backside and posterior occiput on floor.  No injuries noted.  She denied any pain or headaches post fall.  Recommended ER visit, but she declined this and imaging at this time.   Fall at 1005 --  Post fall VS 154/83, 86, 99%, 16 -- no abrasions or hematomas, full ROM.  Relevant past medical, surgical, family and social history reviewed and updated as indicated. Interim medical history since our last visit reviewed. Allergies and medications reviewed and updated.  Review of Systems  Constitutional: Negative for activity change, appetite change, diaphoresis, fatigue and fever.  Respiratory: Negative for cough, chest tightness and shortness of breath.   Cardiovascular: Negative for chest pain, palpitations and leg swelling.  Gastrointestinal: Negative.   Neurological: Negative.   Psychiatric/Behavioral: Negative.     Per HPI unless specifically indicated above     Objective:    BP (!) 145/80 (BP Location: Left Arm, Patient Position: Sitting, Cuff Size: Normal)   Pulse 73   Temp 97.7 F (36.5 C) (Oral)   Ht 5\' 3"  (1.6 m)   Wt 130 lb 6.4 oz (59.1 kg)   BMI 23.10 kg/m   Wt Readings from Last 3 Encounters:  01/11/20 130 lb 6.4 oz (59.1 kg)  08/25/19 132 lb (59.9 kg)  08/11/19 131 lb 12.8 oz (59.8 kg)    Physical Exam Vitals and nursing note reviewed.  Constitutional:      General: She is  awake. She is not in acute distress.    Appearance: She is well-developed and well-groomed. She is not ill-appearing.     Comments: Frail appearing.  HENT:     Head: Normocephalic and atraumatic. No abrasion, contusion or laceration.     Comments: Posterior right occiput with no contusions or abrasions.  No tenderness to touch.    Right Ear: Hearing normal.     Left Ear:  Hearing normal.  Eyes:     General: Lids are normal.        Right eye: No discharge.        Left eye: No discharge.     Conjunctiva/sclera: Conjunctivae normal.     Pupils: Pupils are equal, round, and reactive to light.  Neck:     Vascular: No carotid bruit.  Cardiovascular:     Rate and Rhythm: Normal rate and regular rhythm.     Heart sounds: Normal heart sounds. No murmur heard.  No gallop.      Arteriovenous access: right arteriovenous access is present. Pulmonary:     Effort: Pulmonary effort is normal. No accessory muscle usage or respiratory distress.     Breath sounds: Normal breath sounds.  Abdominal:     General: Bowel sounds are normal.     Palpations: Abdomen is soft.  Musculoskeletal:     Cervical back: Normal range of motion and neck supple.     Right lower leg: No edema.     Left lower leg: No edema.     Comments: Strength upper and lower extremities bilaterally 3-4/5, baseline.  No antalgic gait.  Able to move up into chair without assistance.  Full ROM all extremities without difficulty.  No abrasions or bruising noted to coccyx or lower back.  Skin:    General: Skin is warm and dry.  Neurological:     Mental Status: She is alert and oriented to person, place, and time.     Cranial Nerves: Cranial nerves are intact.     Motor: Motor function is intact.     Coordination: Coordination is intact.     Gait: Gait is intact.     Deep Tendon Reflexes: Reflexes are normal and symmetric.     Reflex Scores:      Brachioradialis reflexes are 2+ on the right side and 2+ on the left side.      Patellar reflexes are 2+ on the right side and 2+ on the left side. Psychiatric:        Attention and Perception: Attention normal.        Mood and Affect: Mood normal.        Speech: Speech normal.        Behavior: Behavior normal. Behavior is cooperative.        Thought Content: Thought content normal.        Judgment: Judgment normal.     Results for orders placed or  performed in visit on 11/26/18  Microscopic Examination   URINE  Result Value Ref Range   WBC, UA 6-10 (A) 0 - 5 /hpf   RBC 0-2 0 - 2 /hpf   Epithelial Cells (non renal) 0-10 0 - 10 /hpf   Casts Present None seen /lpf   Cast Type Hyaline casts N/A   Bacteria, UA Few (A) None seen/Few  Urine Culture, Reflex   URINE  Result Value Ref Range   Urine Culture, Routine Final report    Organism ID, Bacteria Comment   CBC with Differential/Platelet  Result Value Ref Range   WBC 5.1 3.4 - 10.8 x10E3/uL   RBC 3.91 3.77 - 5.28 x10E6/uL   Hemoglobin 11.2 11.1 - 15.9 g/dL   Hematocrit 34.4 34.0 - 46.6 %   MCV 88 79 - 97 fL   MCH 28.6 26.6 - 33.0 pg   MCHC 32.6 31 - 35 g/dL   RDW 14.9 11.7 - 15.4 %   Platelets 207 150 - 450 x10E3/uL   Neutrophils 63 Not Estab. %   Lymphs 21 Not Estab. %   Monocytes 12 Not Estab. %   Eos 3 Not Estab. %   Basos 1 Not Estab. %   Neutrophils Absolute 3.2 1.40 - 7.00 x10E3/uL   Lymphocytes Absolute 1.1 0 - 3 x10E3/uL   Monocytes Absolute 0.6 0 - 0 x10E3/uL   EOS (ABSOLUTE) 0.1 0.0 - 0.4 x10E3/uL   Basophils Absolute 0.1 0 - 0 x10E3/uL   Immature Granulocytes 0 Not Estab. %   Immature Grans (Abs) 0.0 0.0 - 0.1 x10E3/uL  Comprehensive metabolic panel  Result Value Ref Range   Glucose 72 65 - 99 mg/dL   BUN 33 (H) 8 - 27 mg/dL   Creatinine, Ser 2.48 (H) 0.57 - 1.00 mg/dL   GFR calc non Af Amer 18 (L) >59 mL/min/1.73   GFR calc Af Amer 20 (L) >59 mL/min/1.73   BUN/Creatinine Ratio 13 12 - 28   Sodium 140 134 - 144 mmol/L   Potassium 3.7 3.5 - 5.2 mmol/L   Chloride 98 96 - 106 mmol/L   CO2 28 20 - 29 mmol/L   Calcium 8.8 8.7 - 10.3 mg/dL   Total Protein 6.9 6.0 - 8.5 g/dL   Albumin 3.4 (L) 3.6 - 4.6 g/dL   Globulin, Total 3.5 1.5 - 4.5 g/dL   Albumin/Globulin Ratio 1.0 (L) 1.2 - 2.2   Bilirubin Total 0.5 0.0 - 1.2 mg/dL   Alkaline Phosphatase 78 39 - 117 IU/L   AST 11 0 - 40 IU/L   ALT 7 0 - 32 IU/L  Lipid Panel w/o Chol/HDL Ratio  Result Value Ref  Range   Cholesterol, Total 187 100 - 199 mg/dL   Triglycerides 110 0 - 149 mg/dL   HDL 51 >39 mg/dL   VLDL Cholesterol Cal 20 5 - 40 mg/dL   LDL Chol Calc (NIH) 116 (H) 0 - 99 mg/dL  TSH  Result Value Ref Range   TSH 3.570 0.450 - 4.500 uIU/mL  UA/M w/rflx Culture, Routine   Specimen: Urine   URINE  Result Value Ref Range   Specific Gravity, UA 1.015 1.005 - 1.030   pH, UA 7.5 5.0 - 7.5   Color, UA Yellow Yellow   Appearance Ur Clear Clear   Leukocytes,UA 1+ (A) Negative   Protein,UA 2+ (A) Negative/Trace   Glucose, UA Negative Negative   Ketones, UA Negative Negative   RBC, UA Trace (A) Negative   Bilirubin, UA Negative Negative   Urobilinogen, Ur 0.2 0.2 - 1.0 mg/dL   Nitrite, UA Negative Negative   Microscopic Examination See below:    Urinalysis Reflex Comment       Assessment & Plan:   Problem List Items Addressed This Visit      Genitourinary   Hypertensive nephropathy    Chronic, ongoing with ESRD.  Continue collaboration with nephrology and current medication regimen. BP at goal for her age today.  Recent labs with dialysis.      ESRD (end stage renal disease) ([place]) - Primary  Chronic, ongoing.  Receives dialysis.  Continue current medication regimen and collaboration with nephrology team.  Will request dialysis labs, as none noted in Epic to review.        Other   Restless leg syndrome    Chronic, ongoing.  No benefit from Gabapentin or Tylenol in past. Having benefit with  Requip to 0.25 MG BID initially but then benefit decreased, will increase Requip to 0.5 MG in morning and 1 MG at night -- discussed with patient.  Educated on this medication and side effects to monitor for.  Return in 8 weeks.      Claudication of both lower extremities ([place])    Refer to RLS plan, if ongoing discomfort will plan to refer to vascular who she has seen before.      Dependence on renal dialysis ([facility])    Chronic, ongoing.  Continue collaboration with dialysis team and  nephrology.      Fall    In office today, due to misstep.  No injuries noted and normal neuro exam.  Provider was able to assist patient to floor with fall.  Safety Portal filled out.  Discussed with patient, recommended ER visit which she declined.  She also declined imaging.  Have highly recommended if any changes or pain to immediately notify provider or go directly to ER, which she agrees to.  Discussed with her husband as well.         Other Visit Diagnoses    Flu vaccine need       Relevant Orders   Flu Vaccine QUAD High Dose(Fluad) (Completed)       Follow up plan: Return in about 8 weeks (around 03/07/2020) for RLS.

## 2020-01-11 NOTE — Patient Instructions (Signed)

## 2020-01-11 NOTE — Telephone Encounter (Signed)
Please let ER know patient is on way, that she did have fall in office today, her foot caught on exam table and she landed on bottom and gently hit head against floor, provider caught her before she hit hard.  At time she refused ER visit or imaging and had normal exam, normal ROM, but started having right hip pain one hour ago.  Did not land on hip with fall.  She is now headed to ER for assessment due to new pain, have advised her to be seen.  Daughter works in [employer], she is concerned about long wait time.

## 2020-01-11 NOTE — Assessment & Plan Note (Signed)
Chronic, ongoing.  No benefit from Gabapentin or Tylenol in past. Having benefit with  Requip to 0.25 MG BID initially but then benefit decreased, will increase Requip to 0.5 MG in morning and 1 MG at night -- discussed with patient.  Educated on this medication and side effects to monitor for.  Return in 8 weeks.

## 2020-01-11 NOTE — Assessment & Plan Note (Signed)
Chronic, ongoing.  Continue collaboration with dialysis team and nephrology.

## 2020-01-11 NOTE — Assessment & Plan Note (Signed)
In office today, due to misstep.  No injuries noted and normal neuro exam.  Provider was able to assist patient to floor with fall.  Safety Portal filled out.  Discussed with patient, recommended ER visit which she declined.  She also declined imaging.  Have highly recommended if any changes or pain to immediately notify provider or go directly to ER, which she agrees to.  Discussed with her husband as well.

## 2020-01-11 NOTE — Telephone Encounter (Signed)
Pt. Reports she in the office today. Started having right hip pain "about 1 hour ago." Hurts to walk. Will apply ice. Wants to know what PCP wants her to do. No bruising or swelling. Pain 7/10. Please advise pt.   Answer Assessment - Initial Assessment Questions 1. LOCATION and RADIATION: "Where is the pain located?"      Right hip 2. QUALITY: "What does the pain feel like?"  (e.g., sharp, dull, aching, burning)     Achy 3. SEVERITY: "How bad is the pain?" "What does it keep you from doing?"   (Scale 1-10; or mild, moderate, severe)   -  MILD (1-3): doesn't interfere with normal activities    -  MODERATE (4-7): interferes with normal activities (e.g., work or school) or awakens from sleep, limping    -  SEVERE (8-10): excruciating pain, unable to do any normal activities, unable to walk     7 4. ONSET: "When did the pain start?" "Does it come and go, or is it there all the time?"     1 hour ago 5. WORK OR EXERCISE: "Has there been any recent work or exercise that involved this part of the body?"      Fell in the office today 6. CAUSE: "What do you think is causing the hip pain?"      Fall 7. AGGRAVATING FACTORS: "What makes the hip pain worse?" (e.g., walking, climbing stairs, running)     Walking 8. OTHER SYMPTOMS: "Do you have any other symptoms?" (e.g., back pain, pain shooting down leg,  fever, rash)     No  Protocols used: HIP PAIN-A-AH

## 2020-01-11 NOTE — Telephone Encounter (Signed)
PT understood and verbalized understanding.  °

## 2020-01-11 NOTE — Assessment & Plan Note (Signed)
Refer to RLS plan, if ongoing discomfort will plan to refer to vascular who she has seen before.

## 2020-01-11 NOTE — Telephone Encounter (Signed)
Copied from [place] [id number]. Topic: General - Other >> Jan 11, 2020  4:28 PM Keene Breath wrote: Reason for CRM: Patient called to inform the nurse that she is going to the ER now, but she would like the doctor or the nurse to call the ER to let them know that she is coming so that the wait will not be as long.  Please advise.

## 2020-01-11 NOTE — ED Triage Notes (Signed)
Pt states fell at [place] office when attempting to get onto exam table. Pt states she fell onto R buttock/R hip and struck R head on the floor. Pt c/o back and R hip pain and headache. Pt states she took tylenol after fall and that helped relieve headache but back pain did not start until this afternoon and she has taken no meds since tylenol this morning after incident.

## 2020-01-11 NOTE — Telephone Encounter (Signed)
Called [place] ER and notified them of patient coming over via private vehicle for fall and increasing pain.

## 2020-01-11 NOTE — Assessment & Plan Note (Signed)
Chronic, ongoing.  Receives dialysis.  Continue current medication regimen and collaboration with nephrology team.  Will request dialysis labs, as none noted in Epic to review.

## 2020-01-12 ENCOUNTER — Telehealth: Payer: Self-pay

## 2020-01-12 NOTE — Telephone Encounter (Signed)
Please advise Copied from [place] [id number]. Topic: General - Other >> Jan 12, 2020  8:56 AM Hinda Lenis D wrote: PT need a call back to go over her XRAY results from the ER / please advise

## 2020-01-12 NOTE — Telephone Encounter (Signed)
Patient notified and verbalized understanding. 

## 2020-01-12 NOTE — Telephone Encounter (Signed)
Please let her know imaging showed no fractures or brain bleeds, this is good news and I am glad she went.  I thank her for being seen.  Imaging did show some degenerative changes in back, arthritic changes, but no fractures back or hip.  For discomfort she can take Tylenol as needed, apply heat or ice, plus use over the counter creams.  If any worsening or ongoing come and see me.

## 2020-01-12 NOTE — Telephone Encounter (Signed)
Routing to provider to review results.

## 2020-01-14 DIAGNOSIS — Z992 Dependence on renal dialysis: Secondary | ICD-10-CM | POA: Diagnosis not present

## 2020-01-14 DIAGNOSIS — N186 End stage renal disease: Secondary | ICD-10-CM | POA: Diagnosis not present

## 2020-01-17 DIAGNOSIS — N186 End stage renal disease: Secondary | ICD-10-CM | POA: Diagnosis not present

## 2020-01-17 DIAGNOSIS — Z992 Dependence on renal dialysis: Secondary | ICD-10-CM | POA: Diagnosis not present

## 2020-01-21 DIAGNOSIS — Z992 Dependence on renal dialysis: Secondary | ICD-10-CM | POA: Diagnosis not present

## 2020-01-21 DIAGNOSIS — N186 End stage renal disease: Secondary | ICD-10-CM | POA: Diagnosis not present

## 2020-01-24 DIAGNOSIS — N186 End stage renal disease: Secondary | ICD-10-CM | POA: Diagnosis not present

## 2020-01-24 DIAGNOSIS — Z992 Dependence on renal dialysis: Secondary | ICD-10-CM | POA: Diagnosis not present

## 2020-01-24 DIAGNOSIS — N2581 Secondary hyperparathyroidism of renal origin: Secondary | ICD-10-CM | POA: Diagnosis not present

## 2020-01-24 DIAGNOSIS — D509 Iron deficiency anemia, unspecified: Secondary | ICD-10-CM | POA: Diagnosis not present

## 2020-01-26 ENCOUNTER — Telehealth: Payer: Self-pay | Admitting: General Practice

## 2020-01-26 ENCOUNTER — Telehealth: Payer: Self-pay

## 2020-01-26 NOTE — Telephone Encounter (Signed)
°  Chronic Care Management   Outreach Note  01/26/2020 Name: Marissa Hess MRN: [id number] DOB: 14-Jun-1934  Referred by: Venita Lick, NP Reason for referral : Chronic Care Management (RNCM Follow up call for Chronic Diseae Management and Care Coordination Needs)   An unsuccessful telephone outreach was attempted today. The patient was referred to the case management team for assistance with care management and care coordination.   Follow Up Plan: The care management team will reach out to the patient again over the next 30 to 60 days.   Noreene Larsson RN, MSN, [facility] Mobile: [phone]

## 2020-01-28 DIAGNOSIS — Z992 Dependence on renal dialysis: Secondary | ICD-10-CM | POA: Diagnosis not present

## 2020-01-28 DIAGNOSIS — N186 End stage renal disease: Secondary | ICD-10-CM | POA: Diagnosis not present

## 2020-01-29 DIAGNOSIS — I739 Peripheral vascular disease, unspecified: Secondary | ICD-10-CM | POA: Diagnosis not present

## 2020-01-29 DIAGNOSIS — N186 End stage renal disease: Secondary | ICD-10-CM | POA: Diagnosis not present

## 2020-01-29 DIAGNOSIS — M810 Age-related osteoporosis without current pathological fracture: Secondary | ICD-10-CM | POA: Diagnosis not present

## 2020-01-31 DIAGNOSIS — Z992 Dependence on renal dialysis: Secondary | ICD-10-CM | POA: Diagnosis not present

## 2020-01-31 DIAGNOSIS — N186 End stage renal disease: Secondary | ICD-10-CM | POA: Diagnosis not present

## 2020-02-04 DIAGNOSIS — Z992 Dependence on renal dialysis: Secondary | ICD-10-CM | POA: Diagnosis not present

## 2020-02-04 DIAGNOSIS — N186 End stage renal disease: Secondary | ICD-10-CM | POA: Diagnosis not present

## 2020-02-08 DIAGNOSIS — N186 End stage renal disease: Secondary | ICD-10-CM | POA: Diagnosis not present

## 2020-02-08 DIAGNOSIS — Z992 Dependence on renal dialysis: Secondary | ICD-10-CM | POA: Diagnosis not present

## 2020-02-11 DIAGNOSIS — N186 End stage renal disease: Secondary | ICD-10-CM | POA: Diagnosis not present

## 2020-02-11 DIAGNOSIS — Z992 Dependence on renal dialysis: Secondary | ICD-10-CM | POA: Diagnosis not present

## 2020-02-14 DIAGNOSIS — N186 End stage renal disease: Secondary | ICD-10-CM | POA: Diagnosis not present

## 2020-02-14 DIAGNOSIS — Z992 Dependence on renal dialysis: Secondary | ICD-10-CM | POA: Diagnosis not present

## 2020-02-15 DIAGNOSIS — Z992 Dependence on renal dialysis: Secondary | ICD-10-CM | POA: Diagnosis not present

## 2020-02-15 DIAGNOSIS — N186 End stage renal disease: Secondary | ICD-10-CM | POA: Diagnosis not present

## 2020-02-18 DIAGNOSIS — Z992 Dependence on renal dialysis: Secondary | ICD-10-CM | POA: Diagnosis not present

## 2020-02-18 DIAGNOSIS — N186 End stage renal disease: Secondary | ICD-10-CM | POA: Diagnosis not present

## 2020-02-21 DIAGNOSIS — Z992 Dependence on renal dialysis: Secondary | ICD-10-CM | POA: Diagnosis not present

## 2020-02-21 DIAGNOSIS — N186 End stage renal disease: Secondary | ICD-10-CM | POA: Diagnosis not present

## 2020-02-23 NOTE — Telephone Encounter (Signed)
Pt has been r/s  

## 2020-02-25 DIAGNOSIS — Z992 Dependence on renal dialysis: Secondary | ICD-10-CM | POA: Diagnosis not present

## 2020-02-25 DIAGNOSIS — N186 End stage renal disease: Secondary | ICD-10-CM | POA: Diagnosis not present

## 2020-02-28 DIAGNOSIS — N186 End stage renal disease: Secondary | ICD-10-CM | POA: Diagnosis not present

## 2020-02-28 DIAGNOSIS — N2581 Secondary hyperparathyroidism of renal origin: Secondary | ICD-10-CM | POA: Diagnosis not present

## 2020-02-28 DIAGNOSIS — Z992 Dependence on renal dialysis: Secondary | ICD-10-CM | POA: Diagnosis not present

## 2020-02-28 DIAGNOSIS — I739 Peripheral vascular disease, unspecified: Secondary | ICD-10-CM | POA: Diagnosis not present

## 2020-02-28 DIAGNOSIS — M810 Age-related osteoporosis without current pathological fracture: Secondary | ICD-10-CM | POA: Diagnosis not present

## 2020-03-03 DIAGNOSIS — N186 End stage renal disease: Secondary | ICD-10-CM | POA: Diagnosis not present

## 2020-03-03 DIAGNOSIS — Z992 Dependence on renal dialysis: Secondary | ICD-10-CM | POA: Diagnosis not present

## 2020-03-06 DIAGNOSIS — Z992 Dependence on renal dialysis: Secondary | ICD-10-CM | POA: Diagnosis not present

## 2020-03-06 DIAGNOSIS — N186 End stage renal disease: Secondary | ICD-10-CM | POA: Diagnosis not present

## 2020-03-08 ENCOUNTER — Encounter: Payer: Self-pay | Admitting: Nurse Practitioner

## 2020-03-08 ENCOUNTER — Ambulatory Visit (INDEPENDENT_AMBULATORY_CARE_PROVIDER_SITE_OTHER): Payer: Medicare Other | Admitting: Nurse Practitioner

## 2020-03-08 ENCOUNTER — Other Ambulatory Visit: Payer: Self-pay

## 2020-03-08 VITALS — BP 130/82 | HR 96 | Temp 97.6°F | Ht 63.19 in | Wt 130.8 lb

## 2020-03-08 DIAGNOSIS — N186 End stage renal disease: Secondary | ICD-10-CM

## 2020-03-08 DIAGNOSIS — G2581 Restless legs syndrome: Secondary | ICD-10-CM

## 2020-03-08 DIAGNOSIS — Z992 Dependence on renal dialysis: Secondary | ICD-10-CM | POA: Diagnosis not present

## 2020-03-08 DIAGNOSIS — I129 Hypertensive chronic kidney disease with stage 1 through stage 4 chronic kidney disease, or unspecified chronic kidney disease: Secondary | ICD-10-CM

## 2020-03-08 DIAGNOSIS — I739 Peripheral vascular disease, unspecified: Secondary | ICD-10-CM | POA: Diagnosis not present

## 2020-03-08 NOTE — Progress Notes (Signed)
BP 130/82   Pulse 96   Temp 97.6 F (36.4 C) (Oral)   Ht 5' 3.19" (1.605 m)   Wt 130 lb 12.8 oz (59.3 kg)   SpO2 100%   BMI 23.03 kg/m    Subjective:    Patient ID: Marissa Hess, female    DOB: February 03, 1935, 85 y.o.   MRN: [id number]  HPI: Marissa Hess is a 85 y.o. female  Chief Complaint  Patient presents with  . ESRD  . RLS   HYPERTENSION Continues on Carvedilol and ASA.  Has not taken medication yet this morning. Hypertension status: stable  Satisfied with current treatment? yes Duration of hypertension: chronic BP monitoring frequency:  rarely BP range:  BP medication side effects:  no Medication compliance: good compliance Aspirin: yes Recurrent headaches: no Visual changes: no Palpitations: no Dyspnea: no Chest pain: no Lower extremity edema: no Dizzy/lightheaded: no  CHRONIC KIDNEY DISEASE Followed by nephrology, Dr. Holley Raring -- she reports seeing at dialysis.  Is undergoing dialysis under Monday and Friday.  Has labs performed at dialysis center frequently, had drawn on Monday.  Attends [employer], no recent labs in [place]. CKD status: stable Medications renally dose: yes Previous renal evaluation: yes Pneumovax:  Not up to Date Influenza Vaccine:  Up to Date   RESTLESS LEGS Has not taken Requip in two days, has only been taking as needed -- this works well for her. Duration: chronic Discomfort description:  ill-defined, burning and cramping Pain: yes Location: lower legs Bilateral: yes Symmetric: yes Severity: 0/10 Onset:  gradual Frequency:  intermittent Symptoms only occur while legs at rest: yes Sudden unintentional leg jerking: no Bed partner bothered by leg movements: no LE numbness: no Decreased sensation: no Weakness: no Insomnia: yes with discomfort Daytime somnolence: no Fatigue: no Alleviating factors: nothing Aggravating factors: resting at night Status: fluctuating Treatments attempted: Gabapentin and Tylenol  Relevant past  medical, surgical, family and social history reviewed and updated as indicated. Interim medical history since our last visit reviewed. Allergies and medications reviewed and updated.  Review of Systems  Constitutional: Negative for activity change, appetite change, diaphoresis, fatigue and fever.  Respiratory: Negative for cough, chest tightness and shortness of breath.   Cardiovascular: Negative for chest pain, palpitations and leg swelling.  Gastrointestinal: Negative.   Neurological: Negative.   Psychiatric/Behavioral: Negative.     Per HPI unless specifically indicated above     Objective:    BP 130/82   Pulse 96   Temp 97.6 F (36.4 C) (Oral)   Ht 5' 3.19" (1.605 m)   Wt 130 lb 12.8 oz (59.3 kg)   SpO2 100%   BMI 23.03 kg/m   Wt Readings from Last 3 Encounters:  03/08/20 130 lb 12.8 oz (59.3 kg)  01/11/20 130 lb 6.4 oz (59.1 kg)  01/11/20 130 lb 6.4 oz (59.1 kg)    Physical Exam Vitals and nursing note reviewed.  Constitutional:      General: She is awake. She is not in acute distress.    Appearance: She is well-developed and well-groomed. She is not ill-appearing.     Comments: Frail appearing.  HENT:     Head: Normocephalic and atraumatic.     Right Ear: Hearing normal.     Left Ear: Hearing normal.  Eyes:     General: Lids are normal.        Right eye: No discharge.        Left eye: No discharge.     Conjunctiva/sclera: Conjunctivae  normal.     Pupils: Pupils are equal, round, and reactive to light.  Neck:     Vascular: No carotid bruit.  Cardiovascular:     Rate and Rhythm: Normal rate and regular rhythm.     Heart sounds: Normal heart sounds. No murmur heard. No gallop.      Arteriovenous access: right arteriovenous access is present. Pulmonary:     Effort: Pulmonary effort is normal. No accessory muscle usage or respiratory distress.     Breath sounds: Normal breath sounds.  Abdominal:     General: Bowel sounds are normal.     Palpations: Abdomen  is soft.  Musculoskeletal:     Cervical back: Normal range of motion and neck supple.     Right lower leg: No edema.     Left lower leg: No edema.  Skin:    General: Skin is warm and dry.  Neurological:     Mental Status: She is alert and oriented to person, place, and time.     Cranial Nerves: Cranial nerves are intact.     Motor: Motor function is intact.     Coordination: Coordination is intact.     Gait: Gait is intact.     Deep Tendon Reflexes: Reflexes are normal and symmetric.     Reflex Scores:      Brachioradialis reflexes are 2+ on the right side and 2+ on the left side.      Patellar reflexes are 2+ on the right side and 2+ on the left side. Psychiatric:        Attention and Perception: Attention normal.        Mood and Affect: Mood normal.        Speech: Speech normal.        Behavior: Behavior normal. Behavior is cooperative.        Thought Content: Thought content normal.        Judgment: Judgment normal.     Results for orders placed or performed in visit on 11/26/18  Microscopic Examination   URINE  Result Value Ref Range   WBC, UA 6-10 (A) 0 - 5 /hpf   RBC 0-2 0 - 2 /hpf   Epithelial Cells (non renal) 0-10 0 - 10 /hpf   Casts Present None seen /lpf   Cast Type Hyaline casts N/A   Bacteria, UA Few (A) None seen/Few  Urine Culture, Reflex   URINE  Result Value Ref Range   Urine Culture, Routine Final report    Organism ID, Bacteria Comment   CBC with Differential/Platelet  Result Value Ref Range   WBC 5.1 3.4 - 10.8 x10E3/uL   RBC 3.91 3.77 - 5.28 x10E6/uL   Hemoglobin 11.2 11.1 - 15.9 g/dL   Hematocrit 34.4 34.0 - 46.6 %   MCV 88 79 - 97 fL   MCH 28.6 26.6 - 33.0 pg   MCHC 32.6 31.5 - 35.7 g/dL   RDW 14.9 11.7 - 15.4 %   Platelets 207 150 - 450 x10E3/uL   Neutrophils 63 Not Estab. %   Lymphs 21 Not Estab. %   Monocytes 12 Not Estab. %   Eos 3 Not Estab. %   Basos 1 Not Estab. %   Neutrophils Absolute 3.2 1.4 - 7.0 x10E3/uL   Lymphocytes  Absolute 1.1 0.7 - 3.1 x10E3/uL   Monocytes Absolute 0.6 0.1 - 0.9 x10E3/uL   EOS (ABSOLUTE) 0.1 0.0 - 0.4 x10E3/uL   Basophils Absolute 0.1 0.0 - 0.2 x10E3/uL  Immature Granulocytes 0 Not Estab. %   Immature Grans (Abs) 0.0 0.0 - 0.1 x10E3/uL  Comprehensive metabolic panel  Result Value Ref Range   Glucose 72 65 - 99 mg/dL   BUN 33 (H) 8 - 27 mg/dL   Creatinine, Ser 2.48 (H) 0.57 - 1.00 mg/dL   GFR calc non Af Amer 18 (L) >59 mL/min/1.73   GFR calc Af Amer 20 (L) >59 mL/min/1.73   BUN/Creatinine Ratio 13 12 - 28   Sodium 140 134 - 144 mmol/L   Potassium 3.7 3.5 - 5.2 mmol/L   Chloride 98 96 - 106 mmol/L   CO2 28 20 - 29 mmol/L   Calcium 8.8 8.7 - 10.3 mg/dL   Total Protein 6.9 6.0 - 8.5 g/dL   Albumin 3.4 (L) 3.6 - 4.6 g/dL   Globulin, Total 3.5 1.5 - 4.5 g/dL   Albumin/Globulin Ratio 1.0 (L) 1.2 - 2.2   Bilirubin Total 0.5 0.0 - 1.2 mg/dL   Alkaline Phosphatase 78 39 - 117 IU/L   AST 11 0 - 40 IU/L   ALT 7 0 - 32 IU/L  Lipid Panel w/o Chol/HDL Ratio  Result Value Ref Range   Cholesterol, Total 187 100 - 199 mg/dL   Triglycerides 110 0 - 149 mg/dL   HDL 51 >39 mg/dL   VLDL Cholesterol Cal 20 5 - 40 mg/dL   LDL Chol Calc (NIH) 116 (H) 0 - 99 mg/dL  TSH  Result Value Ref Range   TSH 3.570 0.450 - 4.500 uIU/mL  UA/M w/rflx Culture, Routine   Specimen: Urine   URINE  Result Value Ref Range   Specific Gravity, UA 1.015 1.005 - 1.030   pH, UA 7.5 5.0 - 7.5   Color, UA Yellow Yellow   Appearance Ur Clear Clear   Leukocytes,UA 1+ (A) Negative   Protein,UA 2+ (A) Negative/Trace   Glucose, UA Negative Negative   Ketones, UA Negative Negative   RBC, UA Trace (A) Negative   Bilirubin, UA Negative Negative   Urobilinogen, Ur 0.2 0.2 - 1.0 mg/dL   Nitrite, UA Negative Negative   Microscopic Examination See below:    Urinalysis Reflex Comment       Assessment & Plan:   Problem List Items Addressed This Visit      Genitourinary   Hypertensive nephropathy    Chronic,  ongoing with ESRD.  Continue collaboration with nephrology and current medication regimen. BP at goal for her age today.  Recent labs with dialysis.      ESRD (end stage renal disease) ([place]) - Primary    Chronic, ongoing.  Receives dialysis.  Continue current medication regimen and collaboration with nephrology team.  Will request dialysis labs, as none noted in Epic to review.        Other   Restless leg syndrome    Chronic, ongoing.  No benefit from Gabapentin or Tylenol in past. Continue current Requip dosing, she is using as needed only and noting benefit -- discussed with patient.  Educated on this medication and side effects to monitor for.  Return in 6 months.      Claudication of both lower extremities ([place])    Refer to RLS plan, if ongoing discomfort will plan to refer to vascular who she has seen before.      Dependence on renal dialysis ([facility])    Chronic, ongoing.  Continue collaboration with dialysis team and nephrology.          Follow up plan: Return in  about 6 months (around 09/06/2020) for ESRD, HTN, RLS.

## 2020-03-08 NOTE — Assessment & Plan Note (Signed)
Chronic, ongoing.  Receives dialysis.  Continue current medication regimen and collaboration with nephrology team.  Will request dialysis labs, as none noted in Epic to review.

## 2020-03-08 NOTE — Assessment & Plan Note (Signed)
Chronic, ongoing.  Continue collaboration with dialysis team and nephrology.

## 2020-03-08 NOTE — Patient Instructions (Signed)

## 2020-03-08 NOTE — Assessment & Plan Note (Signed)
Chronic, ongoing.  No benefit from Gabapentin or Tylenol in past. Continue current Requip dosing, she is using as needed only and noting benefit -- discussed with patient.  Educated on this medication and side effects to monitor for.  Return in 6 months.

## 2020-03-08 NOTE — Assessment & Plan Note (Signed)
Refer to RLS plan, if ongoing discomfort will plan to refer to vascular who she has seen before.

## 2020-03-08 NOTE — Assessment & Plan Note (Signed)
Chronic, ongoing with ESRD.  Continue collaboration with nephrology and current medication regimen. BP at goal for her age today.  Recent labs with dialysis.

## 2020-03-10 DIAGNOSIS — Z992 Dependence on renal dialysis: Secondary | ICD-10-CM | POA: Diagnosis not present

## 2020-03-10 DIAGNOSIS — N186 End stage renal disease: Secondary | ICD-10-CM | POA: Diagnosis not present

## 2020-03-13 DIAGNOSIS — N2581 Secondary hyperparathyroidism of renal origin: Secondary | ICD-10-CM | POA: Diagnosis not present

## 2020-03-13 DIAGNOSIS — Z992 Dependence on renal dialysis: Secondary | ICD-10-CM | POA: Diagnosis not present

## 2020-03-13 DIAGNOSIS — N186 End stage renal disease: Secondary | ICD-10-CM | POA: Diagnosis not present

## 2020-03-17 DIAGNOSIS — N186 End stage renal disease: Secondary | ICD-10-CM | POA: Diagnosis not present

## 2020-03-17 DIAGNOSIS — Z992 Dependence on renal dialysis: Secondary | ICD-10-CM | POA: Diagnosis not present

## 2020-03-21 DIAGNOSIS — N186 End stage renal disease: Secondary | ICD-10-CM | POA: Diagnosis not present

## 2020-03-21 DIAGNOSIS — Z992 Dependence on renal dialysis: Secondary | ICD-10-CM | POA: Diagnosis not present

## 2020-03-24 DIAGNOSIS — Z992 Dependence on renal dialysis: Secondary | ICD-10-CM | POA: Diagnosis not present

## 2020-03-24 DIAGNOSIS — N186 End stage renal disease: Secondary | ICD-10-CM | POA: Diagnosis not present

## 2020-03-27 DIAGNOSIS — N2581 Secondary hyperparathyroidism of renal origin: Secondary | ICD-10-CM | POA: Diagnosis not present

## 2020-03-27 DIAGNOSIS — D509 Iron deficiency anemia, unspecified: Secondary | ICD-10-CM | POA: Diagnosis not present

## 2020-03-27 DIAGNOSIS — Z992 Dependence on renal dialysis: Secondary | ICD-10-CM | POA: Diagnosis not present

## 2020-03-27 DIAGNOSIS — N186 End stage renal disease: Secondary | ICD-10-CM | POA: Diagnosis not present

## 2020-03-30 DIAGNOSIS — M810 Age-related osteoporosis without current pathological fracture: Secondary | ICD-10-CM | POA: Diagnosis not present

## 2020-03-30 DIAGNOSIS — N186 End stage renal disease: Secondary | ICD-10-CM | POA: Diagnosis not present

## 2020-03-30 DIAGNOSIS — I739 Peripheral vascular disease, unspecified: Secondary | ICD-10-CM | POA: Diagnosis not present

## 2020-03-31 DIAGNOSIS — N186 End stage renal disease: Secondary | ICD-10-CM | POA: Diagnosis not present

## 2020-03-31 DIAGNOSIS — Z992 Dependence on renal dialysis: Secondary | ICD-10-CM | POA: Diagnosis not present

## 2020-04-04 DIAGNOSIS — N186 End stage renal disease: Secondary | ICD-10-CM | POA: Diagnosis not present

## 2020-04-04 DIAGNOSIS — Z992 Dependence on renal dialysis: Secondary | ICD-10-CM | POA: Diagnosis not present

## 2020-04-07 DIAGNOSIS — Z992 Dependence on renal dialysis: Secondary | ICD-10-CM | POA: Diagnosis not present

## 2020-04-07 DIAGNOSIS — N186 End stage renal disease: Secondary | ICD-10-CM | POA: Diagnosis not present

## 2020-04-10 DIAGNOSIS — N186 End stage renal disease: Secondary | ICD-10-CM | POA: Diagnosis not present

## 2020-04-10 DIAGNOSIS — Z992 Dependence on renal dialysis: Secondary | ICD-10-CM | POA: Diagnosis not present

## 2020-04-10 DIAGNOSIS — N2581 Secondary hyperparathyroidism of renal origin: Secondary | ICD-10-CM | POA: Diagnosis not present

## 2020-04-14 DIAGNOSIS — Z992 Dependence on renal dialysis: Secondary | ICD-10-CM | POA: Diagnosis not present

## 2020-04-14 DIAGNOSIS — N186 End stage renal disease: Secondary | ICD-10-CM | POA: Diagnosis not present

## 2020-04-17 DIAGNOSIS — Z992 Dependence on renal dialysis: Secondary | ICD-10-CM | POA: Diagnosis not present

## 2020-04-17 DIAGNOSIS — N186 End stage renal disease: Secondary | ICD-10-CM | POA: Diagnosis not present

## 2020-04-18 ENCOUNTER — Telehealth: Payer: Medicare Other | Admitting: General Practice

## 2020-04-18 ENCOUNTER — Ambulatory Visit (INDEPENDENT_AMBULATORY_CARE_PROVIDER_SITE_OTHER): Payer: Medicare Other | Admitting: General Practice

## 2020-04-18 DIAGNOSIS — N186 End stage renal disease: Secondary | ICD-10-CM

## 2020-04-18 DIAGNOSIS — I10 Essential (primary) hypertension: Secondary | ICD-10-CM

## 2020-04-18 DIAGNOSIS — Z992 Dependence on renal dialysis: Secondary | ICD-10-CM | POA: Diagnosis not present

## 2020-04-18 NOTE — Chronic Care Management (AMB) (Signed)
Chronic Care Management   CCM RN Visit Note  04/18/2020 Name: Marissa Hess MRN: [id number] DOB: Mar 24, 1934  Subjective: HANEEFAH Hess is a 86 y.o. year old female who is a primary care patient of Cannady, Henrine Screws T, NP. The care management team was consulted for assistance with disease management and care coordination needs.    Engaged with patient by telephone for follow up visit in response to provider referral for case management and/or care coordination services.   Consent to Services:  The patient was given information about Chronic Care Management services, agreed to services, and gave verbal consent prior to initiation of services.  Please see initial visit note for detailed documentation.   Patient agreed to services and verbal consent obtained.   Assessment: Review of patient past medical history, allergies, medications, health status, including review of consultants reports, laboratory and other test data, was performed as part of comprehensive evaluation and provision of chronic care management services.   SDOH (Social Determinants of Health) assessments and interventions performed:    CCM Care Plan  Allergies  Allergen Reactions   Codeine Itching   Ibuprofen     Avoids "because of lupus"   Sulfa Antibiotics Diarrhea   Vioxx [Rofecoxib]     Avoids "because of lupus"   Celebrex [Celecoxib] Rash    Avoids "because of lupus"   Penicillins Rash    Outpatient Encounter Medications as of 04/18/2020  Medication Sig   aspirin EC 81 MG tablet Take 81 mg by mouth daily.   carvedilol (COREG) 25 MG tablet Take 1 tablet (25 mg total) by mouth 2 (two) times daily with a meal.   cholecalciferol (VITAMIN D) 1000 units tablet Take 1,000 Units by mouth daily.    ferrous sulfate (SLOW FE) 160 (50 Fe) MG TBCR SR tablet Take 1 tablet by mouth daily.    folic acid (FOLVITE) 1 MG tablet Take 1 tablet (1 mg total) by mouth daily.   levocetirizine (XYZAL) 5 MG tablet Take 1  tablet (5 mg total) by mouth every evening.   rOPINIRole (REQUIP) 0.5 MG tablet Take one tablet (0.5 MG) by mouth in the morning and then take two tablets (1 MG) by mouth at night before bedtime.   No facility-administered encounter medications on file as of 04/18/2020.    Patient Active Problem List   Diagnosis Date Noted   Poor mobility 05/25/2019   Dependence on renal dialysis ([place]) 08/20/2018   B12 deficiency 01/26/2018   ESRD (end stage renal disease) ([place]) 01/23/2018   Anemia associated with stage 4 chronic renal failure ([place]) 10/20/2017   Advanced care planning/counseling discussion 09/25/2016   Gout 10/16/2015   Claudication of both lower extremities ([place]) 10/16/2015   Raynaud's phenomenon without gangrene 04/03/2015   Restless leg syndrome 09/26/2014   Hypertensive nephropathy 08/19/2014   Hyperlipemia 08/19/2014   Allergic rhinitis 08/19/2014   Senile osteoporosis 08/19/2014   Esophageal reflux 08/19/2014    Conditions to be addressed/monitored:HTN and ESRD  Care Plan : RNCM: Hypertension (Adult)  Updates made by Vanita Ingles since 04/18/2020 12:00 AM    Problem: RNCM: Hypertension (Hypertension)   Priority: Medium    Goal: RNCM: Hypertension Monitored   Priority: Medium  Note:   Objective:   Last practice recorded BP readings:  BP Readings from Last 3 Encounters:  03/08/20 130/82  01/11/20 (!) 163/74  01/11/20 (!) 145/80     Most recent eGFR/CrCl: No results found for: EGFR  No components found for: CRCL Current Barriers:  Knowledge Deficits related to basic understanding of hypertension pathophysiology and self care management  Limited Social Support  Lacks social connections  Does not contact provider office for questions/concerns Case Manager Clinical Goal(s):   Over the next 120 days, patient will verbalize understanding of plan for hypertension management  Over the next 120 days, patient will attend all scheduled medical  appointments: 09-06-2020  Over the next 120 days, patient will demonstrate improved adherence to prescribed treatment plan for hypertension as evidenced by taking all medications as prescribed, monitoring and recording blood pressure as directed, adhering to low sodium/DASH diet  Over the next 120 days, patient will demonstrate improved health management independence as evidenced by checking blood pressure as directed and notifying PCP if SBP>160 or DBP > 90, taking all medications as prescribe, and adhering to a low sodium diet as discussed.  Over the next 120 days, patient will verbalize basic understanding of hypertension disease process and self health management plan as evidenced by compliance with diet, medications, and working with the CCM team to meet health and wellness goals.  Interventions:   Collaboration with Venita Lick, NP regarding development and update of comprehensive plan of care as evidenced by provider attestation and co-signature  Inter-disciplinary care team collaboration (see longitudinal plan of care)  Evaluation of current treatment plan related to hypertension self management and patient's adherence to plan as established by provider.  Provided education to patient re: stroke prevention, s/s of heart attack and stroke, DASH diet, complications of uncontrolled blood pressure  Reviewed medications with patient and discussed importance of compliance  Discussed plans with patient for ongoing care management follow up and provided patient with direct contact information for care management team  Advised patient, providing education and rationale, to monitor blood pressure daily and record, calling PCP for findings outside established parameters.   Reviewed scheduled/upcoming provider appointments including: 09-06-2020 Patient Goals/Self-Care Activities  Over the next 120 days, patient will:  - Self administers medications as prescribed Attends all scheduled  provider appointments Calls provider office for new concerns, questions, or BP outside discussed parameters Checks BP and records as discussed Follows a low sodium diet/DASH diet - blood pressure trends reviewed - depression screen reviewed - home or ambulatory blood pressure monitoring encouraged  Follow Up Plan: Telephone follow up appointment with care management team member scheduled for: 06-27-2020 at 1 pm   Task: RNCM: Identify and Monitor Blood Pressure Elevation   Note:   Care Management Activities:    - blood pressure trends reviewed - depression screen reviewed - home or ambulatory blood pressure monitoring encouraged       Care Plan : RNCM: Management of ESRD  Updates made by Vanita Ingles since 04/18/2020 12:00 AM    Problem: Adjustment to Chronic Kidney Disease     Long-Range Goal: RNCM: Management of ESRD   Priority: Medium  Note:   Current Barriers:   Chronic Disease Management support and education needs related to ESRD  Lacks caregiver support.   Unable to independently manage ESRD  Lacks social connections  Does not contact provider office for questions/concerns  Nurse Case Manager Clinical Goal(s):   Over the next 120 days, patient will verbalize understanding of plan for effective management of ESRD  Over the next 120 days, patient will work with [facility] and pcp to address needs related to ESRD  Over the next 120 days, patient will attend all scheduled medical appointments: 09-06-2020   Over the next 120 days, patient will demonstrate improved  health management independence as evidenced bystablized condition, no exacerbations from ESRD, compliance with medications   Interventions:   1:1 collaboration with Venita Lick, NP regarding development and update of comprehensive plan of care as evidenced by provider attestation and co-signature  Inter-disciplinary care team collaboration (see longitudinal plan of care)  Evaluation of current  treatment plan related to ESRD and patient's adherence to plan as established by provider.  Advised patient to call the office for changes in condition or questions   Provided education to patient re: maintaining dialysis schedule, eating heart healthy renal diet, following recommendations by provider   Reviewed scheduled/upcoming provider appointments including: 09-06-2020  Discussed plans with patient for ongoing care management follow up and provided patient with direct contact information for care management team  Patient Goals/Self-Care Activities Over the next 120 days, patient will:  - Patient will self administer medications as prescribed Patient will attend all scheduled provider appointments Patient will call pharmacy for medication refills Patient will attend church or other social activities Patient will continue to perform IADL's independently Patient will call provider office for new concerns or questions Patient will work with BSW to address care coordination needs and will continue to work with the clinical team to address health care and disease management related needs.   - decision-making supported - depression screen reviewed - goal setting facilitated - self-care encouraged - self-reflection promoted - verbalization of feelings encouraged  Follow Up Plan: Telephone follow up appointment with care management team member scheduled for: 06-27-2020 at 1 pm       Task: RNCM: Support Psychological Response to Chronic Kidney Disease   Note:   Care Management Activities:    - decision-making supported - depression screen reviewed - goal setting facilitated - self-care encouraged - self-reflection promoted - verbalization of feelings encouraged         Plan:Telephone follow up appointment with care management team member scheduled for:  06-27-2020 at 1 pm  Noreene Larsson RN, MSN, [facility] Mobile: [phone]

## 2020-04-18 NOTE — Patient Instructions (Signed)
Visit Information  PATIENT GOALS: Goals Addressed              This Visit's Progress   .  COMPLETED: RNCM: "I get tired really easy" (pt-stated)        CARE PLAN ENTRY (see longtitudinal plan of care for additional care plan information)  Current Barriers: Closing this care plan and opening in new ELS . Chronic Disease Management support, education, and care coordination needs related to HLD, ESRD, Anxiety, and RLS  Clinical Goal(s) related to HLD, ESRD, Anxiety, and RLS :  Over the next 120 days, patient will:  . Work with the care management team to address educational, disease management, and care coordination needs  . Begin or continue self health monitoring activities as directed today  adhere to heart healthy/renal diet . Call provider office for new or worsened signs and symptoms Shortness of breath and New or worsened symptom related to RLS/HLD/Anxiety/ESRD and other chronic conditions . Call care management team with questions or concerns . Verbalize basic understanding of patient centered plan of care established today  Interventions related to HLD, ESRD, Anxiety, and RLS :  . Evaluation of current treatment plans and patient's adherence to plan as established by provider. 11-26-2019:The patient states that when she went to dialysis today the nurse told her she has some "rattling" in her chest. The patient verbalized that she has had "stopped up ears" and productive cough with sometimes "dark brown" sputum.  The patient says it has been going on for about 2 weeks.  In basket message to front office staff to see about getting the patient an appointment to come in and see the pcp next week. Education provided on worsening condition and if she experiences shob, fever, or other sx/sx to seek care are urgent care or ER.  The patient verbalized understanding.  . Assessed patient understanding of disease states.  The patient has a good understanding of her chronic conditions. Is taking  dialysis on Monday's and Fridays now. 11-26-2019: Dialysis is going well. Denies any concerns with dialysis.  . Assessed patient's education and care coordination needs.  The patient states she gets tired easily and can not clean her bathroom. Her husband helps her with the rest of the house but she needs assistance with her bathroom. Care guide referral for resources to help with cleaning. 11-26-2019: Her family is helping with cleaning at this time. The patient has her scooter and says this is a big help for her.  . Provided disease specific education to patient.  The patient is compliant with dietary restrictions, medications and following up with providers in a timely manner. Will continue to monitor for new concerns and needs. Nash Dimmer with appropriate clinical care team members regarding patient needs . Care guide referral for resources in [facility] for help in the home one day a week. The patient is aware that this may be an out of pocket expense and not covered by the insurance.   Patient Self Care Activities related to HLD, ESRD, Anxiety, and RLS :  . Patient is unable to independently self-manage chronic health conditions  Please see past updates related to this goal by clicking on the "Past Updates" button in the selected goal         The patient verbalized understanding of instructions, educational materials, and care plan provided today and declined offer to receive copy of patient instructions, educational materials, and care plan.   Telephone follow up appointment with care management team member  scheduled for: 06-27-2020 at 1 pm [place], MSN, [facility] Mobile: [phone]

## 2020-04-21 DIAGNOSIS — N186 End stage renal disease: Secondary | ICD-10-CM | POA: Diagnosis not present

## 2020-04-21 DIAGNOSIS — Z992 Dependence on renal dialysis: Secondary | ICD-10-CM | POA: Diagnosis not present

## 2020-04-24 DIAGNOSIS — Z992 Dependence on renal dialysis: Secondary | ICD-10-CM | POA: Diagnosis not present

## 2020-04-24 DIAGNOSIS — N186 End stage renal disease: Secondary | ICD-10-CM | POA: Diagnosis not present

## 2020-04-28 DIAGNOSIS — Z992 Dependence on renal dialysis: Secondary | ICD-10-CM | POA: Diagnosis not present

## 2020-04-28 DIAGNOSIS — N186 End stage renal disease: Secondary | ICD-10-CM | POA: Diagnosis not present

## 2020-04-30 DIAGNOSIS — N186 End stage renal disease: Secondary | ICD-10-CM | POA: Diagnosis not present

## 2020-04-30 DIAGNOSIS — I739 Peripheral vascular disease, unspecified: Secondary | ICD-10-CM | POA: Diagnosis not present

## 2020-04-30 DIAGNOSIS — M810 Age-related osteoporosis without current pathological fracture: Secondary | ICD-10-CM | POA: Diagnosis not present

## 2020-05-02 DIAGNOSIS — Z992 Dependence on renal dialysis: Secondary | ICD-10-CM | POA: Diagnosis not present

## 2020-05-02 DIAGNOSIS — N186 End stage renal disease: Secondary | ICD-10-CM | POA: Diagnosis not present

## 2020-05-05 DIAGNOSIS — Z992 Dependence on renal dialysis: Secondary | ICD-10-CM | POA: Diagnosis not present

## 2020-05-05 DIAGNOSIS — N186 End stage renal disease: Secondary | ICD-10-CM | POA: Diagnosis not present

## 2020-05-08 DIAGNOSIS — N186 End stage renal disease: Secondary | ICD-10-CM | POA: Diagnosis not present

## 2020-05-08 DIAGNOSIS — Z992 Dependence on renal dialysis: Secondary | ICD-10-CM | POA: Diagnosis not present

## 2020-05-12 DIAGNOSIS — N2581 Secondary hyperparathyroidism of renal origin: Secondary | ICD-10-CM | POA: Diagnosis not present

## 2020-05-12 DIAGNOSIS — N186 End stage renal disease: Secondary | ICD-10-CM | POA: Diagnosis not present

## 2020-05-12 DIAGNOSIS — Z992 Dependence on renal dialysis: Secondary | ICD-10-CM | POA: Diagnosis not present

## 2020-05-15 DIAGNOSIS — Z992 Dependence on renal dialysis: Secondary | ICD-10-CM | POA: Diagnosis not present

## 2020-05-15 DIAGNOSIS — N186 End stage renal disease: Secondary | ICD-10-CM | POA: Diagnosis not present

## 2020-05-19 DIAGNOSIS — Z992 Dependence on renal dialysis: Secondary | ICD-10-CM | POA: Diagnosis not present

## 2020-05-19 DIAGNOSIS — N186 End stage renal disease: Secondary | ICD-10-CM | POA: Diagnosis not present

## 2020-05-22 DIAGNOSIS — N186 End stage renal disease: Secondary | ICD-10-CM | POA: Diagnosis not present

## 2020-05-22 DIAGNOSIS — Z992 Dependence on renal dialysis: Secondary | ICD-10-CM | POA: Diagnosis not present

## 2020-05-26 DIAGNOSIS — Z992 Dependence on renal dialysis: Secondary | ICD-10-CM | POA: Diagnosis not present

## 2020-05-26 DIAGNOSIS — N186 End stage renal disease: Secondary | ICD-10-CM | POA: Diagnosis not present

## 2020-05-28 DIAGNOSIS — I739 Peripheral vascular disease, unspecified: Secondary | ICD-10-CM | POA: Diagnosis not present

## 2020-05-28 DIAGNOSIS — N186 End stage renal disease: Secondary | ICD-10-CM | POA: Diagnosis not present

## 2020-05-28 DIAGNOSIS — M810 Age-related osteoporosis without current pathological fracture: Secondary | ICD-10-CM | POA: Diagnosis not present

## 2020-05-29 DIAGNOSIS — D509 Iron deficiency anemia, unspecified: Secondary | ICD-10-CM | POA: Diagnosis not present

## 2020-05-29 DIAGNOSIS — Z992 Dependence on renal dialysis: Secondary | ICD-10-CM | POA: Diagnosis not present

## 2020-05-29 DIAGNOSIS — N186 End stage renal disease: Secondary | ICD-10-CM | POA: Diagnosis not present

## 2020-05-29 DIAGNOSIS — N2581 Secondary hyperparathyroidism of renal origin: Secondary | ICD-10-CM | POA: Diagnosis not present

## 2020-06-02 DIAGNOSIS — N186 End stage renal disease: Secondary | ICD-10-CM | POA: Diagnosis not present

## 2020-06-02 DIAGNOSIS — Z992 Dependence on renal dialysis: Secondary | ICD-10-CM | POA: Diagnosis not present

## 2020-06-05 DIAGNOSIS — Z992 Dependence on renal dialysis: Secondary | ICD-10-CM | POA: Diagnosis not present

## 2020-06-05 DIAGNOSIS — N186 End stage renal disease: Secondary | ICD-10-CM | POA: Diagnosis not present

## 2020-06-09 DIAGNOSIS — N186 End stage renal disease: Secondary | ICD-10-CM | POA: Diagnosis not present

## 2020-06-09 DIAGNOSIS — Z992 Dependence on renal dialysis: Secondary | ICD-10-CM | POA: Diagnosis not present

## 2020-06-12 DIAGNOSIS — N2581 Secondary hyperparathyroidism of renal origin: Secondary | ICD-10-CM | POA: Diagnosis not present

## 2020-06-12 DIAGNOSIS — Z992 Dependence on renal dialysis: Secondary | ICD-10-CM | POA: Diagnosis not present

## 2020-06-12 DIAGNOSIS — N186 End stage renal disease: Secondary | ICD-10-CM | POA: Diagnosis not present

## 2020-06-15 DIAGNOSIS — Z992 Dependence on renal dialysis: Secondary | ICD-10-CM | POA: Diagnosis not present

## 2020-06-15 DIAGNOSIS — N186 End stage renal disease: Secondary | ICD-10-CM | POA: Diagnosis not present

## 2020-06-16 DIAGNOSIS — N186 End stage renal disease: Secondary | ICD-10-CM | POA: Diagnosis not present

## 2020-06-16 DIAGNOSIS — Z992 Dependence on renal dialysis: Secondary | ICD-10-CM | POA: Diagnosis not present

## 2020-06-19 DIAGNOSIS — N186 End stage renal disease: Secondary | ICD-10-CM | POA: Diagnosis not present

## 2020-06-19 DIAGNOSIS — Z992 Dependence on renal dialysis: Secondary | ICD-10-CM | POA: Diagnosis not present

## 2020-06-23 DIAGNOSIS — N186 End stage renal disease: Secondary | ICD-10-CM | POA: Diagnosis not present

## 2020-06-23 DIAGNOSIS — Z992 Dependence on renal dialysis: Secondary | ICD-10-CM | POA: Diagnosis not present

## 2020-06-26 DIAGNOSIS — N2581 Secondary hyperparathyroidism of renal origin: Secondary | ICD-10-CM | POA: Diagnosis not present

## 2020-06-26 DIAGNOSIS — Z992 Dependence on renal dialysis: Secondary | ICD-10-CM | POA: Diagnosis not present

## 2020-06-26 DIAGNOSIS — N186 End stage renal disease: Secondary | ICD-10-CM | POA: Diagnosis not present

## 2020-06-26 DIAGNOSIS — D509 Iron deficiency anemia, unspecified: Secondary | ICD-10-CM | POA: Diagnosis not present

## 2020-06-27 ENCOUNTER — Telehealth: Payer: Self-pay | Admitting: General Practice

## 2020-06-27 ENCOUNTER — Telehealth: Payer: Self-pay

## 2020-06-27 NOTE — Telephone Encounter (Signed)
  Chronic Care Management   Outreach Note  06/27/2020 Name: Marissa Hess MRN: [id number] DOB: 1935/03/03  Referred by: Venita Lick, NP Reason for referral : Appointment (RNCM: Follow up for Chronic Disease Management and Care Coordination Needs)   An unsuccessful telephone outreach was attempted today. The patient was referred to the case management team for assistance with care management and care coordination.   Follow Up Plan: A HIPAA compliant phone message was left for the patient providing contact information and requesting a return call.   Noreene Larsson RN, MSN, [facility] Mobile: [phone]

## 2020-06-28 DIAGNOSIS — I739 Peripheral vascular disease, unspecified: Secondary | ICD-10-CM | POA: Diagnosis not present

## 2020-06-28 DIAGNOSIS — M810 Age-related osteoporosis without current pathological fracture: Secondary | ICD-10-CM | POA: Diagnosis not present

## 2020-06-28 DIAGNOSIS — N186 End stage renal disease: Secondary | ICD-10-CM | POA: Diagnosis not present

## 2020-06-28 NOTE — Telephone Encounter (Signed)
Please reschedule f/u with RN CM

## 2020-06-29 NOTE — Telephone Encounter (Signed)
Patient rescheduled for 08/08/2020

## 2020-06-30 DIAGNOSIS — Z992 Dependence on renal dialysis: Secondary | ICD-10-CM | POA: Diagnosis not present

## 2020-06-30 DIAGNOSIS — N186 End stage renal disease: Secondary | ICD-10-CM | POA: Diagnosis not present

## 2020-07-03 DIAGNOSIS — N186 End stage renal disease: Secondary | ICD-10-CM | POA: Diagnosis not present

## 2020-07-03 DIAGNOSIS — Z992 Dependence on renal dialysis: Secondary | ICD-10-CM | POA: Diagnosis not present

## 2020-07-07 DIAGNOSIS — Z992 Dependence on renal dialysis: Secondary | ICD-10-CM | POA: Diagnosis not present

## 2020-07-07 DIAGNOSIS — N186 End stage renal disease: Secondary | ICD-10-CM | POA: Diagnosis not present

## 2020-07-10 DIAGNOSIS — Z992 Dependence on renal dialysis: Secondary | ICD-10-CM | POA: Diagnosis not present

## 2020-07-10 DIAGNOSIS — N2581 Secondary hyperparathyroidism of renal origin: Secondary | ICD-10-CM | POA: Diagnosis not present

## 2020-07-10 DIAGNOSIS — N186 End stage renal disease: Secondary | ICD-10-CM | POA: Diagnosis not present

## 2020-07-14 DIAGNOSIS — N186 End stage renal disease: Secondary | ICD-10-CM | POA: Diagnosis not present

## 2020-07-14 DIAGNOSIS — Z992 Dependence on renal dialysis: Secondary | ICD-10-CM | POA: Diagnosis not present

## 2020-07-15 DIAGNOSIS — Z992 Dependence on renal dialysis: Secondary | ICD-10-CM | POA: Diagnosis not present

## 2020-07-15 DIAGNOSIS — N186 End stage renal disease: Secondary | ICD-10-CM | POA: Diagnosis not present

## 2020-07-17 DIAGNOSIS — N186 End stage renal disease: Secondary | ICD-10-CM | POA: Diagnosis not present

## 2020-07-17 DIAGNOSIS — Z992 Dependence on renal dialysis: Secondary | ICD-10-CM | POA: Diagnosis not present

## 2020-07-21 DIAGNOSIS — N186 End stage renal disease: Secondary | ICD-10-CM | POA: Diagnosis not present

## 2020-07-21 DIAGNOSIS — Z992 Dependence on renal dialysis: Secondary | ICD-10-CM | POA: Diagnosis not present

## 2020-07-24 DIAGNOSIS — Z992 Dependence on renal dialysis: Secondary | ICD-10-CM | POA: Diagnosis not present

## 2020-07-24 DIAGNOSIS — N2581 Secondary hyperparathyroidism of renal origin: Secondary | ICD-10-CM | POA: Diagnosis not present

## 2020-07-24 DIAGNOSIS — N186 End stage renal disease: Secondary | ICD-10-CM | POA: Diagnosis not present

## 2020-07-24 DIAGNOSIS — D509 Iron deficiency anemia, unspecified: Secondary | ICD-10-CM | POA: Diagnosis not present

## 2020-07-28 DIAGNOSIS — Z992 Dependence on renal dialysis: Secondary | ICD-10-CM | POA: Diagnosis not present

## 2020-07-28 DIAGNOSIS — N186 End stage renal disease: Secondary | ICD-10-CM | POA: Diagnosis not present

## 2020-07-31 DIAGNOSIS — Z992 Dependence on renal dialysis: Secondary | ICD-10-CM | POA: Diagnosis not present

## 2020-07-31 DIAGNOSIS — N186 End stage renal disease: Secondary | ICD-10-CM | POA: Diagnosis not present

## 2020-08-01 ENCOUNTER — Ambulatory Visit (INDEPENDENT_AMBULATORY_CARE_PROVIDER_SITE_OTHER): Payer: Medicare Other

## 2020-08-01 ENCOUNTER — Other Ambulatory Visit: Payer: Self-pay

## 2020-08-01 ENCOUNTER — Ambulatory Visit (INDEPENDENT_AMBULATORY_CARE_PROVIDER_SITE_OTHER): Payer: Medicare Other | Admitting: Vascular Surgery

## 2020-08-01 DIAGNOSIS — N186 End stage renal disease: Secondary | ICD-10-CM | POA: Diagnosis not present

## 2020-08-04 DIAGNOSIS — N186 End stage renal disease: Secondary | ICD-10-CM | POA: Diagnosis not present

## 2020-08-04 DIAGNOSIS — Z992 Dependence on renal dialysis: Secondary | ICD-10-CM | POA: Diagnosis not present

## 2020-08-07 DIAGNOSIS — N186 End stage renal disease: Secondary | ICD-10-CM | POA: Diagnosis not present

## 2020-08-07 DIAGNOSIS — N2581 Secondary hyperparathyroidism of renal origin: Secondary | ICD-10-CM | POA: Diagnosis not present

## 2020-08-07 DIAGNOSIS — Z992 Dependence on renal dialysis: Secondary | ICD-10-CM | POA: Diagnosis not present

## 2020-08-08 ENCOUNTER — Ambulatory Visit (INDEPENDENT_AMBULATORY_CARE_PROVIDER_SITE_OTHER): Payer: Medicare Other | Admitting: General Practice

## 2020-08-08 ENCOUNTER — Telehealth: Payer: Self-pay | Admitting: General Practice

## 2020-08-08 DIAGNOSIS — I10 Essential (primary) hypertension: Secondary | ICD-10-CM | POA: Diagnosis not present

## 2020-08-08 DIAGNOSIS — N186 End stage renal disease: Secondary | ICD-10-CM | POA: Diagnosis not present

## 2020-08-08 NOTE — Patient Instructions (Signed)
Visit Information  PATIENT GOALS: Patient Care Plan: RNCM: Hypertension (Adult)    Problem Identified: RNCM: Hypertension (Hypertension)   Priority: Medium    Goal: RNCM: Hypertension Monitored   Priority: Medium  Note:   Objective:  . Last practice recorded BP readings:  BP Readings from Last 3 Encounters:  03/08/20 130/82  01/11/20 (!) 163/74  01/11/20 (!) 145/80 .   Marland Kitchen Most recent eGFR/CrCl: No results found for: EGFR  No components found for: CRCL Current Barriers:  Marland Kitchen Knowledge Deficits related to basic understanding of hypertension pathophysiology and self care management . Limited Social Support . Lacks social connections . Does not contact provider office for questions/concerns Case Manager Clinical Goal(s):  Marland Kitchen Over the next 120 days, patient will verbalize understanding of plan for hypertension management . Over the next 120 days, patient will attend all scheduled medical appointments: 09-06-2020 . Over the next 120 days, patient will demonstrate improved adherence to prescribed treatment plan for hypertension as evidenced by taking all medications as prescribed, monitoring and recording blood pressure as directed, adhering to low sodium/DASH diet . Over the next 120 days, patient will demonstrate improved health management independence as evidenced by checking blood pressure as directed and notifying PCP if SBP>160 or DBP > 90, taking all medications as prescribe, and adhering to a low sodium diet as discussed. . Over the next 120 days, patient will verbalize basic understanding of hypertension disease process and self health management plan as evidenced by compliance with diet, medications, and working with the CCM team to meet health and wellness goals.  Interventions:  . Collaboration with Venita Lick, NP regarding development and update of comprehensive plan of care as evidenced by provider attestation and co-signature . Inter-disciplinary care team collaboration (see  longitudinal plan of care) . Evaluation of current treatment plan related to hypertension self management and patient's adherence to plan as established by provider. 08-08-2020: The patient is stable. Denies any new concerns with HTN, eating well most days, getting rest, pacing activity. Denies any new concerns. . Provided education to patient re: stroke prevention, s/s of heart attack and stroke, DASH diet, complications of uncontrolled blood pressure . Reviewed medications with patient and discussed importance of compliance . Discussed plans with patient for ongoing care management follow up and provided patient with direct contact information for care management team . Advised patient, providing education and rationale, to monitor blood pressure daily and record, calling PCP for findings outside established parameters.  . Reviewed scheduled/upcoming provider appointments including: 09-06-2020 Patient Goals/Self-Care Activities . Over the next 120 days, patient will:  - Self administers medications as prescribed Attends all scheduled provider appointments Calls provider office for new concerns, questions, or BP outside discussed parameters Checks BP and records as discussed Follows a low sodium diet/DASH diet - blood pressure trends reviewed - depression screen reviewed - home or ambulatory blood pressure monitoring encouraged  Follow Up Plan: Telephone follow up appointment with care management team member scheduled for: 10-17-2020 at 1030 am   Task: RNCM: Identify and Monitor Blood Pressure Elevation   Note:   Care Management Activities:    - blood pressure trends reviewed - depression screen reviewed - home or ambulatory blood pressure monitoring encouraged       Patient Care Plan: RNCM: Management of ESRD    Problem Identified: Adjustment to Chronic Kidney Disease     Long-Range Goal: RNCM: Management of ESRD   Priority: Medium  Note:   Current Barriers:  . Chronic Disease  Management support and education needs related to ESRD . Lacks caregiver support.  . Unable to independently manage ESRD . Lacks social connections . Does not contact provider office for questions/concerns  Nurse Case Manager Clinical Goal(s):  Marland Kitchen Over the next 120 days, patient will verbalize understanding of plan for effective management of ESRD . Over the next 120 days, patient will work with [facility] and pcp to address needs related to ESRD . Over the next 120 days, patient will attend all scheduled medical appointments: 09-06-2020  . Over the next 120 days, patient will demonstrate improved health management independence as evidenced bystablized condition, no exacerbations from ESRD, compliance with medications   Interventions:  . 1:1 collaboration with Venita Lick, NP regarding development and update of comprehensive plan of care as evidenced by provider attestation and co-signature . Inter-disciplinary care team collaboration (see longitudinal plan of care) . Evaluation of current treatment plan related to ESRD and patient's adherence to plan as established by provider. 08-08-2020: The patient still taking dialysis on Monday and Friday. Denies any new concerns related to ESRD.  Marland Kitchen Advised patient to call the office for changes in condition or questions  . Provided education to patient re: maintaining dialysis schedule, eating heart healthy renal diet, following recommendations by provider. 08-08-2020: Reviewed with the patient and compliance noted. Denies any concerns with getting prescription medications.  . Reviewed scheduled/upcoming provider appointments including: 09-06-2020 . Discussed plans with patient for ongoing care management follow up and provided patient with direct contact information for care management team  Patient Goals/Self-Care Activities Over the next 120 days, patient will:  - Patient will self administer medications as prescribed Patient will attend all scheduled  provider appointments Patient will call pharmacy for medication refills Patient will attend church or other social activities Patient will continue to perform IADL's independently Patient will call provider office for new concerns or questions Patient will work with BSW to address care coordination needs and will continue to work with the clinical team to address health care and disease management related needs.   - decision-making supported - depression screen reviewed - goal setting facilitated - self-care encouraged - self-reflection promoted - verbalization of feelings encouraged  Follow Up Plan: Telephone follow up appointment with care management team member scheduled for: 10-17-2020 at 1030 am       Task: RNCM: Support Psychological Response to Chronic Kidney Disease   Note:   Care Management Activities:    - decision-making supported - depression screen reviewed - goal setting facilitated - self-care encouraged - self-reflection promoted - verbalization of feelings encouraged         The patient verbalized understanding of instructions, educational materials, and care plan provided today and declined offer to receive copy of patient instructions, educational materials, and care plan.   Telephone follow up appointment with care management team member scheduled for: 10-17-2020 and 1030 am  [place], MSN, [facility] Mobile: [phone]

## 2020-08-08 NOTE — Chronic Care Management (AMB) (Signed)
Chronic Care Management   CCM RN Visit Note  08/08/2020 Name: Marissa Hess MRN: [id number] DOB: 10-28-1934  Subjective: Marissa Hess is a 86 y.o. year old female who is a primary care patient of Cannady, Henrine Screws T, NP. The care management team was consulted for assistance with disease management and care coordination needs.    Engaged with patient by telephone for follow up visit in response to provider referral for case management and/or care coordination services.   Consent to Services:  The patient was given information about Chronic Care Management services, agreed to services, and gave verbal consent prior to initiation of services.  Please see initial visit note for detailed documentation.   Patient agreed to services and verbal consent obtained.   Assessment: Review of patient past medical history, allergies, medications, health status, including review of consultants reports, laboratory and other test data, was performed as part of comprehensive evaluation and provision of chronic care management services.   SDOH (Social Determinants of Health) assessments and interventions performed:    CCM Care Plan  Allergies  Allergen Reactions  . Codeine Itching  . Ibuprofen     Avoids "because of lupus"  . Sulfa Antibiotics Diarrhea  . Vioxx [Rofecoxib]     Avoids "because of lupus"  . Celebrex [Celecoxib] Rash    Avoids "because of lupus"  . Penicillins Rash    Outpatient Encounter Medications as of 08/08/2020  Medication Sig  . aspirin EC 81 MG tablet Take 81 mg by mouth daily.  . carvedilol (COREG) 25 MG tablet Take 1 tablet (25 mg total) by mouth 2 (two) times daily with a meal.  . cholecalciferol (VITAMIN D) 1000 units tablet Take 1,000 Units by mouth daily.   . ferrous sulfate (SLOW FE) 160 (50 Fe) MG TBCR SR tablet Take 1 tablet by mouth daily.   . folic acid (FOLVITE) 1 MG tablet Take 1 tablet (1 mg total) by mouth daily.  Marland Kitchen levocetirizine (XYZAL) 5 MG tablet Take 1  tablet (5 mg total) by mouth every evening.  Marland Kitchen rOPINIRole (REQUIP) 0.5 MG tablet Take one tablet (0.5 MG) by mouth in the morning and then take two tablets (1 MG) by mouth at night before bedtime.   No facility-administered encounter medications on file as of 08/08/2020.    Patient Active Problem List   Diagnosis Date Noted  . Poor mobility 05/25/2019  . Dependence on renal dialysis ([place]) 08/20/2018  . B12 deficiency 01/26/2018  . ESRD (end stage renal disease) ([place]) 01/23/2018  . Anemia associated with stage 4 chronic renal failure ([place]) 10/20/2017  . Advanced care planning/counseling discussion 09/25/2016  . Gout 10/16/2015  . Claudication of both lower extremities ([place]) 10/16/2015  . Raynaud's phenomenon without gangrene 04/03/2015  . Restless leg syndrome 09/26/2014  . Hypertensive nephropathy 08/19/2014  . Hyperlipemia 08/19/2014  . Allergic rhinitis 08/19/2014  . Senile osteoporosis 08/19/2014  . Esophageal reflux 08/19/2014    Conditions to be addressed/monitored:HTN and ESRD  Care Plan : RNCM: Hypertension (Adult)  Updates made by Vanita Ingles since 08/08/2020 12:00 AM    Problem: RNCM: Hypertension (Hypertension)   Priority: Medium    Goal: RNCM: Hypertension Monitored   Priority: Medium  Note:   Objective:  . Last practice recorded BP readings:  BP Readings from Last 3 Encounters:  03/08/20 130/82  01/11/20 (!) 163/74  01/11/20 (!) 145/80 .   Marland Kitchen Most recent eGFR/CrCl: No results found for: EGFR  No components found for: CRCL Current Barriers:  .  Knowledge Deficits related to basic understanding of hypertension pathophysiology and self care management . Limited Social Support . Lacks social connections . Does not contact provider office for questions/concerns Case Manager Clinical Goal(s):  Marland Kitchen Over the next 120 days, patient will verbalize understanding of plan for hypertension management . Over the next 120 days, patient will attend all scheduled medical  appointments: 09-06-2020 . Over the next 120 days, patient will demonstrate improved adherence to prescribed treatment plan for hypertension as evidenced by taking all medications as prescribed, monitoring and recording blood pressure as directed, adhering to low sodium/DASH diet . Over the next 120 days, patient will demonstrate improved health management independence as evidenced by checking blood pressure as directed and notifying PCP if SBP>160 or DBP > 90, taking all medications as prescribe, and adhering to a low sodium diet as discussed. . Over the next 120 days, patient will verbalize basic understanding of hypertension disease process and self health management plan as evidenced by compliance with diet, medications, and working with the CCM team to meet health and wellness goals.  Interventions:  . Collaboration with Venita Lick, NP regarding development and update of comprehensive plan of care as evidenced by provider attestation and co-signature . Inter-disciplinary care team collaboration (see longitudinal plan of care) . Evaluation of current treatment plan related to hypertension self management and patient's adherence to plan as established by provider. 08-08-2020: The patient is stable. Denies any new concerns with HTN, eating well most days, getting rest, pacing activity. Denies any new concerns. . Provided education to patient re: stroke prevention, s/s of heart attack and stroke, DASH diet, complications of uncontrolled blood pressure . Reviewed medications with patient and discussed importance of compliance . Discussed plans with patient for ongoing care management follow up and provided patient with direct contact information for care management team . Advised patient, providing education and rationale, to monitor blood pressure daily and record, calling PCP for findings outside established parameters.  . Reviewed scheduled/upcoming provider appointments including:  09-06-2020 Patient Goals/Self-Care Activities . Over the next 120 days, patient will:  - Self administers medications as prescribed Attends all scheduled provider appointments Calls provider office for new concerns, questions, or BP outside discussed parameters Checks BP and records as discussed Follows a low sodium diet/DASH diet - blood pressure trends reviewed - depression screen reviewed - home or ambulatory blood pressure monitoring encouraged  Follow Up Plan: Telephone follow up appointment with care management team member scheduled for: 10-17-2020 at 105 am   Care Plan : RNCM: Management of ESRD  Updates made by Vanita Ingles since 08/08/2020 12:00 AM    Problem: Adjustment to Chronic Kidney Disease     Long-Range Goal: RNCM: Management of ESRD   Priority: Medium  Note:   Current Barriers:  . Chronic Disease Management support and education needs related to ESRD . Lacks caregiver support.  . Unable to independently manage ESRD . Lacks social connections . Does not contact provider office for questions/concerns  Nurse Case Manager Clinical Goal(s):  Marland Kitchen Over the next 120 days, patient will verbalize understanding of plan for effective management of ESRD . Over the next 120 days, patient will work with [facility] and pcp to address needs related to ESRD . Over the next 120 days, patient will attend all scheduled medical appointments: 09-06-2020  . Over the next 120 days, patient will demonstrate improved health management independence as evidenced bystablized condition, no exacerbations from ESRD, compliance with medications   Interventions:  .  1:1 collaboration with Venita Lick, NP regarding development and update of comprehensive plan of care as evidenced by provider attestation and co-signature . Inter-disciplinary care team collaboration (see longitudinal plan of care) . Evaluation of current treatment plan related to ESRD and patient's adherence to plan as established by  provider. 08-08-2020: The patient still taking dialysis on Monday and Friday. Denies any new concerns related to ESRD.  Marland Kitchen Advised patient to call the office for changes in condition or questions  . Provided education to patient re: maintaining dialysis schedule, eating heart healthy renal diet, following recommendations by provider. 08-08-2020: Reviewed with the patient and compliance noted. Denies any concerns with getting prescription medications.  . Reviewed scheduled/upcoming provider appointments including: 09-06-2020 . Discussed plans with patient for ongoing care management follow up and provided patient with direct contact information for care management team  Patient Goals/Self-Care Activities Over the next 120 days, patient will:  - Patient will self administer medications as prescribed Patient will attend all scheduled provider appointments Patient will call pharmacy for medication refills Patient will attend church or other social activities Patient will continue to perform IADL's independently Patient will call provider office for new concerns or questions Patient will work with BSW to address care coordination needs and will continue to work with the clinical team to address health care and disease management related needs.   - decision-making supported - depression screen reviewed - goal setting facilitated - self-care encouraged - self-reflection promoted - verbalization of feelings encouraged  Follow Up Plan: Telephone follow up appointment with care management team member scheduled for: 10-17-2020 at 54 am         Plan:Telephone follow up appointment with care management team member scheduled for:  10-17-2020 at 69 am  [place], MSN, [facility] Mobile: [phone]

## 2020-08-11 DIAGNOSIS — Z992 Dependence on renal dialysis: Secondary | ICD-10-CM | POA: Diagnosis not present

## 2020-08-11 DIAGNOSIS — N186 End stage renal disease: Secondary | ICD-10-CM | POA: Diagnosis not present

## 2020-08-14 DIAGNOSIS — N186 End stage renal disease: Secondary | ICD-10-CM | POA: Diagnosis not present

## 2020-08-14 DIAGNOSIS — Z992 Dependence on renal dialysis: Secondary | ICD-10-CM | POA: Diagnosis not present

## 2020-08-15 ENCOUNTER — Encounter (INDEPENDENT_AMBULATORY_CARE_PROVIDER_SITE_OTHER): Payer: Self-pay | Admitting: *Deleted

## 2020-08-15 DIAGNOSIS — Z992 Dependence on renal dialysis: Secondary | ICD-10-CM | POA: Diagnosis not present

## 2020-08-15 DIAGNOSIS — N186 End stage renal disease: Secondary | ICD-10-CM | POA: Diagnosis not present

## 2020-08-18 DIAGNOSIS — N186 End stage renal disease: Secondary | ICD-10-CM | POA: Diagnosis not present

## 2020-08-18 DIAGNOSIS — Z992 Dependence on renal dialysis: Secondary | ICD-10-CM | POA: Diagnosis not present

## 2020-08-22 DIAGNOSIS — N186 End stage renal disease: Secondary | ICD-10-CM | POA: Diagnosis not present

## 2020-08-22 DIAGNOSIS — Z992 Dependence on renal dialysis: Secondary | ICD-10-CM | POA: Diagnosis not present

## 2020-08-25 DIAGNOSIS — Z992 Dependence on renal dialysis: Secondary | ICD-10-CM | POA: Diagnosis not present

## 2020-08-25 DIAGNOSIS — N186 End stage renal disease: Secondary | ICD-10-CM | POA: Diagnosis not present

## 2020-08-28 ENCOUNTER — Ambulatory Visit (INDEPENDENT_AMBULATORY_CARE_PROVIDER_SITE_OTHER): Payer: Medicare Other

## 2020-08-28 VITALS — Ht 63.0 in | Wt 130.0 lb

## 2020-08-28 DIAGNOSIS — Z992 Dependence on renal dialysis: Secondary | ICD-10-CM | POA: Diagnosis not present

## 2020-08-28 DIAGNOSIS — D509 Iron deficiency anemia, unspecified: Secondary | ICD-10-CM | POA: Diagnosis not present

## 2020-08-28 DIAGNOSIS — Z0000 Encounter for general adult medical examination without abnormal findings: Secondary | ICD-10-CM | POA: Diagnosis not present

## 2020-08-28 DIAGNOSIS — N186 End stage renal disease: Secondary | ICD-10-CM | POA: Diagnosis not present

## 2020-08-28 DIAGNOSIS — N2581 Secondary hyperparathyroidism of renal origin: Secondary | ICD-10-CM | POA: Diagnosis not present

## 2020-08-28 NOTE — Progress Notes (Signed)
I connected with Marissa Hess today by telephone and verified that I am speaking with the correct person using two identifiers. Location patient: home Location provider: work Persons participating in the virtual visit: Aydia, Kubly LPN.   I discussed the limitations, risks, security and privacy concerns of performing an evaluation and management service by telephone and the availability of in person appointments. I also discussed with the patient that there may be a patient responsible charge related to this service. The patient expressed understanding and verbally consented to this telephonic visit.    Interactive audio and video telecommunications were attempted between this provider and patient, however failed, due to patient having technical difficulties OR patient did not have access to video capability.  We continued and completed visit with audio only.     Vital signs may be patient reported or missing.  Subjective:   JUILIANA Hess is a 86 y.o. female who presents for Medicare Annual (Subsequent) preventive examination.  Review of Systems     Cardiac Risk Factors include: advanced age (>13mn, >>38women);dyslipidemia;hypertension;sedentary lifestyle     Objective:    Today's Vitals   08/28/20 1256  Weight: 130 lb (59 kg)  Height: '5\' 3"'$  (1.6 m)   Body mass index is 23.03 kg/m.  Advanced Directives 08/28/2020 01/11/2020 08/25/2019 09/10/2018 01/16/2018 01/13/2018 01/13/2018  Does Patient Have a Medical Advance Directive? No No No No No No No  Does patient want to make changes to medical advance directive? - - Yes (MAU/Ambulatory/Procedural Areas - Information given) - - - -  Would patient like information on creating a medical advance directive? - - - - No - Patient declined No - Patient declined -    Current Medications (verified) Outpatient Encounter Medications as of 08/28/2020  Medication Sig   aspirin EC 81 MG tablet Take 81 mg by mouth daily.    carvedilol (COREG) 25 MG tablet Take 1 tablet (25 mg total) by mouth 2 (two) times daily with a meal.   cholecalciferol (VITAMIN D) 1000 units tablet Take 1,000 Units by mouth daily.    ferrous sulfate (SLOW FE) 160 (50 Fe) MG TBCR SR tablet Take 1 tablet by mouth daily.    folic acid (FOLVITE) 1 MG tablet Take 1 tablet (1 mg total) by mouth daily.   rOPINIRole (REQUIP) 0.5 MG tablet Take one tablet (0.5 MG) by mouth in the morning and then take two tablets (1 MG) by mouth at night before bedtime.   levocetirizine (XYZAL) 5 MG tablet Take 1 tablet (5 mg total) by mouth every evening. (Patient not taking: Reported on 08/28/2020)   No facility-administered encounter medications on file as of 08/28/2020.    Allergies (verified) Codeine, Ibuprofen, Sulfa antibiotics, Vioxx [rofecoxib], Celebrex [celecoxib], and Penicillins   History: Past Medical History:  Diagnosis Date   Allergy    Arthritis    Chronic kidney disease    CKD V   GERD (gastroesophageal reflux disease)    Hypertension    [place] [place]   Macular degeneration of left eye    Myocardial infarction ([facility] 2007   Osteoporosis    Past Surgical History:  Procedure Laterality Date   A/V FISTULAGRAM Right 01/16/2018   Procedure: A/V FISTULAGRAM;  Surgeon: PShelda Altes MD;  [facility];  Service: Cardiovascular;  Laterality: Right;   ABDOMINAL HYSTERECTOMY     AV FISTULA PLACEMENT Right 12/17/2017   Procedure: ARTERIOVENOUS (AV) FISTULA CREATION ( BRACHIO CEPHALIC POSS. BRACHIO BASILIC );  Surgeon: Algernon Huxley, MD;  Location: [facility];  Service: Vascular;  Laterality: Right;   CATARACT EXTRACTION W/PHACO Right 01/03/2016   Procedure: CATARACT EXTRACTION PHACO AND INTRAOCULAR LENS PLACEMENT ([place]);  Surgeon: Leandrew Koyanagi, MD;  Location: [facility];  Service: Ophthalmology;  Laterality: Right;   COLON SURGERY     intestine became twisted after hernia sugery   EYE SURGERY     cataract    HERNIA REPAIR     TEMPORAL ARTERY BIOPSY / LIGATION     TONSILLECTOMY     Family History  Problem Relation Age of Onset   Stomach cancer Mother    Rheum arthritis Father    Breast cancer Neg Hx    Social History   Socioeconomic History   Marital status: Married    Spouse name: Not on file   Number of children: Not on file   Years of education: Not on file   Highest education level: High school graduate  Occupational History    Comment: Recruitment consultant; Orthoptist; child care  Tobacco Use   Smoking status: Former    Pack years: 0.00    Types: Cigarettes    Start date: 09/11/1984    Quit date: 09/26/1994    Years since quitting: 25.9   Smokeless tobacco: Never  Vaping Use   Vaping Use: Never used  Substance and Sexual Activity   Alcohol use: No    Alcohol/week: 0.0 standard drinks   Drug use: No   Sexual activity: Not on file  Other Topics Concern   Not on file  Social History Narrative   Not on file   Social Determinants of Health   Financial Resource Strain: Low Risk    Difficulty of Paying Living Expenses: Not hard at all  Food Insecurity: No Food Insecurity   Worried About Charity fundraiser in the Last Year: Never true   Ran Out of Food in the Last Year: Never true  Transportation Needs: No Transportation Needs   Lack of Transportation (Medical): No   Lack of Transportation (Non-Medical): No  Physical Activity: Inactive   Days of Exercise per Week: 0 days   Minutes of Exercise per Session: 0 min  Stress: No Stress Concern Present   Feeling of Stress : Not at all  Social Connections: Not on file    Tobacco Counseling Counseling given: Not Answered   Clinical Intake:  Pre-visit preparation completed: Yes  Pain : No/denies pain     Nutritional Status: BMI of 19-24  Normal Nutritional Risks: None Diabetes: No  How often do you need to have someone help you when you read instructions, pamphlets, or other written materials from your doctor or pharmacy?:  1 - Never What is the last grade level you completed in school?: 11th grade  Diabetic?no  Interpreter Needed?: No  Information entered by :: NAllen LPN   Activities of Daily Living In your present state of health, do you have any difficulty performing the following activities: 08/28/2020  Hearing? N  Vision? N  Difficulty concentrating or making decisions? N  Walking or climbing stairs? Y  Dressing or bathing? N  Doing errands, shopping? N  Preparing Food and eating ? N  Using the Toilet? N  In the past six months, have you accidently leaked urine? N  Do you have problems with loss of bowel control? N  Managing your Medications? N  Managing your Finances? N  Housekeeping or managing your Housekeeping? N  Some recent data might be  hidden    Patient Care Team: Venita Lick, NP as PCP - General (Nurse Practitioner) Lutricia Horsfall, [name], MD as Referring Physician (Ophthalmology) Vanita Ingles, RN as Case Manager (General Practice)  Indicate any recent Medical Services you may have received from other than [facility] providers in the past year (date may be approximate).     Assessment:   This is a routine wellness examination for Marissa Hess.  Hearing/Vision screen No results found.  Dietary issues and exercise activities discussed: Current Exercise Habits: The patient does not participate in regular exercise at present   Goals Addressed             This Visit's Progress    Patient Stated       08/28/2020, increase water intake        Depression Screen PHQ 2/9 Scores 08/28/2020 03/08/2020 08/25/2019 11/26/2018 09/10/2018 09/03/2017 03/19/2017  PHQ - 2 Score 0 0 0 1 0 2 0  PHQ- 9 Score - - - 4 - 3 -    Fall Risk Fall Risk  08/28/2020 03/08/2020 08/25/2019 11/26/2018 09/29/2018  Falls in the past year? 0 0 1 1 0  Number falls in past yr: - 0 0 0 0  Injury with Fall? - 0 0 1 0  Risk for fall due to : Medication side effect - - - -   Follow up Falls evaluation completed;Education provided;Falls prevention discussed - - - Falls evaluation completed    FALL RISK PREVENTION PERTAINING TO THE HOME:  Any stairs in or around the home? Yes  If so, are there any without handrails? No  Home free of loose throw rugs in walkways, pet beds, electrical cords, etc? Yes  Adequate lighting in your home to reduce risk of falls? Yes   ASSISTIVE DEVICES UTILIZED TO PREVENT FALLS:  Life alert? No  Use of a cane, walker or w/c? Yes  Grab bars in the bathroom? Yes  Shower chair or bench in shower? Yes  Elevated toilet seat or a handicapped toilet? No   TIMED UP AND GO:  Was the test performed? No .       Cognitive Function:     6CIT Screen 08/28/2020 09/10/2018 09/03/2017 08/29/2016  What Year? 0 points 0 points 0 points 0 points  What month? 0 points 0 points 0 points 0 points  What time? 0 points 0 points 0 points 0 points  Count back from 20 0 points 0 points 0 points 0 points  Months in reverse 0 points 0 points 0 points 0 points  Repeat phrase 0 points 0 points 2 points 2 points  Total Score 0 0 2 2    Immunizations Immunization History  Administered Date(s) Administered   Fluad Quad(high Dose 65+) 11/26/2018, 01/11/2020   Hepatitis B, adult 02/06/2018, 03/06/2018, 04/08/2018, 08/07/2018   Influenza, High Dose Seasonal PF 12/20/2016   Influenza,inj,Quad PF,6+ Mos 03/06/2015, 01/21/2020   Influenza-Unspecified 12/17/2015, 01/16/2018, 02/13/2018, 11/23/2018, 12/10/2018, 12/27/2019   Moderna Sars-Covid-2 Vaccination 05/15/2019, 06/12/2019, 02/16/2020   PPD Test 02/06/2018, 02/16/2018   Pneumococcal Polysaccharide-23 02/20/2018   Pneumococcal-Unspecified 07/28/2008   Tdap 05/26/2012    TDAP status: Up to date  Flu Vaccine status: Up to date  Pneumococcal vaccine status: Due, Education has been provided regarding the importance of this vaccine. Advised may receive this vaccine at local pharmacy or Health Dept.  Aware to provide a copy of the vaccination record if obtained from local pharmacy or Health Dept. Verbalized acceptance  and understanding.  Covid-19 vaccine status: Completed vaccines  Qualifies for Shingles Vaccine? Yes   Zostavax completed No   Shingrix Completed?: No.    Education has been provided regarding the importance of this vaccine. Patient has been advised to call insurance company to determine out of pocket expense if they have not yet received this vaccine. Advised may also receive vaccine at local pharmacy or Health Dept. Verbalized acceptance and understanding.  Screening Tests Health Maintenance  Topic Date Due   Zoster Vaccines- Shingrix (1 of 2) Never done   COVID-19 Vaccine (4 - Booster for Moderna series) 06/16/2020   PNA vac Low Risk Adult (2 of 2 - PCV13) 02/21/2023 (Originally 02/21/2019)   INFLUENZA VACCINE  10/16/2020   TETANUS/TDAP  05/27/2022   DEXA SCAN  Completed   HPV VACCINES  Aged Out    Health Maintenance  Health Maintenance Due  Topic Date Due   Zoster Vaccines- Shingrix (1 of 2) Never done   COVID-19 Vaccine (4 - Booster for Moderna series) 06/16/2020    Colorectal cancer screening: No longer required.   Mammogram status: No longer required due to age.  Bone Density status: Completed 11/07/2015  Lung Cancer Screening: (Low Dose CT Chest recommended if Age 23-80 years, 30 pack-year currently smoking OR have quit w/in 15years.) does not qualify.   Lung Cancer Screening Referral: no  Additional Screening:  Hepatitis C Screening: does not qualify;   Vision Screening: Recommended annual ophthalmology exams for early detection of glaucoma and other disorders of the eye. Is the patient up to date with their annual eye exam?  Yes  Who is the provider or what is the name of the office in which the patient attends annual eye exams? [facility] If pt is not established with a provider, would they like to be referred to a provider to establish  care? No .   Dental Screening: Recommended annual dental exams for proper oral hygiene  Community Resource Referral / Chronic Care Management: CRR required this visit?  No   CCM required this visit?  No      Plan:     I have personally reviewed and noted the following in the patient's chart:   Medical and social history Use of alcohol, tobacco or illicit drugs  Current medications and supplements including opioid prescriptions.  Functional ability and status Nutritional status Physical activity Advanced directives List of other physicians Hospitalizations, surgeries, and ER visits in previous 12 months Vitals Screenings to include cognitive, depression, and falls Referrals and appointments  In addition, I have reviewed and discussed with patient certain preventive protocols, quality metrics, and best practice recommendations. A written personalized care plan for preventive services as well as general preventive health recommendations were provided to patient.     Kellie Simmering, LPN   [id number]   Nurse Notes:

## 2020-08-28 NOTE — Patient Instructions (Signed)
Marissa Hess , Thank you for taking time to come for your Medicare Wellness Visit. I appreciate your ongoing commitment to your health goals. Please review the following plan we discussed and let me know if I can assist you in the future.   Screening recommendations/referrals: Colonoscopy: not required Mammogram: not required Bone Density: completed 11/07/2015 Recommended yearly ophthalmology/optometry visit for glaucoma screening and checkup Recommended yearly dental visit for hygiene and checkup  Vaccinations: Influenza vaccine: completed 01/21/2020, due 10/16/2020 Pneumococcal vaccine: completed 02/20/2018 Tdap vaccine: completed 05/26/2012 Shingles vaccine: discussed   Covid-19: 02/16/2020, 06/12/2019, 05/15/2019  Advanced directives: Advance directive discussed with you today. ut.  Conditions/risks identified: none  Next appointment: Follow up in one year for your annual wellness visit    Preventive Care 65 Years and Older, Female Preventive care refers to lifestyle choices and visits with your health care provider that can promote health and wellness. What does preventive care include? A yearly physical exam. This is also called an annual well check. Dental exams once or twice a year. Routine eye exams. Ask your health care provider how often you should have your eyes checked. Personal lifestyle choices, including: Daily care of your teeth and gums. Regular physical activity. Eating a healthy diet. Avoiding tobacco and drug use. Limiting alcohol use. Practicing safe sex. Taking low-dose aspirin every day. Taking vitamin and mineral supplements as recommended by your health care provider. What happens during an annual well check? The services and screenings done by your health care provider during your annual well check will depend on your age, overall health, lifestyle risk factors, and family history of disease. Counseling  Your health care provider may ask you questions about  your: Alcohol use. Tobacco use. Drug use. Emotional well-being. Home and relationship well-being. Sexual activity. Eating habits. History of falls. Memory and ability to understand (cognition). Work and work Statistician. Reproductive health. Screening  You may have the following tests or measurements: Height, weight, and BMI. Blood pressure. Lipid and cholesterol levels. These may be checked every 5 years, or more frequently if you are over 60 years old. Skin check. Lung cancer screening. You may have this screening every year starting at age 87 if you have a 30-pack-year history of smoking and currently smoke or have quit within the past 15 years. Fecal occult blood test (FOBT) of the stool. You may have this test every year starting at age 34. Flexible sigmoidoscopy or colonoscopy. You may have a sigmoidoscopy every 5 years or a colonoscopy every 10 years starting at age 66. Hepatitis C blood test. Hepatitis B blood test. Sexually transmitted disease (STD) testing. Diabetes screening. This is done by checking your blood sugar (glucose) after you have not eaten for a while (fasting). You may have this done every 1-3 years. Bone density scan. This is done to screen for osteoporosis. You may have this done starting at age 37. Mammogram. This may be done every 1-2 years. Talk to your health care provider about how often you should have regular mammograms. Talk with your health care provider about your test results, treatment options, and if necessary, the need for more tests. Vaccines  Your health care provider may recommend certain vaccines, such as: Influenza vaccine. This is recommended every year. Tetanus, diphtheria, and acellular pertussis (Tdap, Td) vaccine. You may need a Td booster every 10 years. Zoster vaccine. You may need this after age 70. Pneumococcal 13-valent conjugate (PCV13) vaccine. One dose is recommended after age 13. Pneumococcal polysaccharide (PPSV23) vaccine.  One  dose is recommended after age 60. Talk to your health care provider about which screenings and vaccines you need and how often you need them. This information is not intended to replace advice given to you by your health care provider. Make sure you discuss any questions you have with your health care provider. Document Released: 03/31/2015 Document Revised: 11/22/2015 Document Reviewed: 01/03/2015 Elsevier Interactive Patient Education  2017 [place] Prevention in the Home Falls can cause injuries. They can happen to people of all ages. There are many things you can do to make your home safe and to help prevent falls. What can I do on the outside of my home? Regularly fix the edges of walkways and driveways and fix any cracks. Remove anything that might make you trip as you walk through a door, such as a raised step or threshold. Trim any bushes or trees on the path to your home. Use bright outdoor lighting. Clear any walking paths of anything that might make someone trip, such as rocks or tools. Regularly check to see if handrails are loose or broken. Make sure that both sides of any steps have handrails. Any raised decks and porches should have guardrails on the edges. Have any leaves, snow, or ice cleared regularly. Use sand or salt on walking paths during winter. Clean up any spills in your garage right away. This includes oil or grease spills. What can I do in the bathroom? Use night lights. Install grab bars by the toilet and in the tub and shower. Do not use towel bars as grab bars. Use non-skid mats or decals in the tub or shower. If you need to sit down in the shower, use a plastic, non-slip stool. Keep the floor dry. Clean up any water that spills on the floor as soon as it happens. Remove soap buildup in the tub or shower regularly. Attach bath mats securely with double-sided non-slip rug tape. Do not have throw rugs and other things on the floor that can make  you trip. What can I do in the bedroom? Use night lights. Make sure that you have a light by your bed that is easy to reach. Do not use any sheets or blankets that are too big for your bed. They should not hang down onto the floor. Have a firm chair that has side arms. You can use this for support while you get dressed. Do not have throw rugs and other things on the floor that can make you trip. What can I do in the kitchen? Clean up any spills right away. Avoid walking on wet floors. Keep items that you use a lot in easy-to-reach places. If you need to reach something above you, use a strong step stool that has a grab bar. Keep electrical cords out of the way. Do not use floor polish or wax that makes floors slippery. If you must use wax, use non-skid floor wax. Do not have throw rugs and other things on the floor that can make you trip. What can I do with my stairs? Do not leave any items on the stairs. Make sure that there are handrails on both sides of the stairs and use them. Fix handrails that are broken or loose. Make sure that handrails are as long as the stairways. Check any carpeting to make sure that it is firmly attached to the stairs. Fix any carpet that is loose or worn. Avoid having throw rugs at the top or bottom of the stairs.  If you do have throw rugs, attach them to the floor with carpet tape. Make sure that you have a light switch at the top of the stairs and the bottom of the stairs. If you do not have them, ask someone to add them for you. What else can I do to help prevent falls? Wear shoes that: Do not have high heels. Have rubber bottoms. Are comfortable and fit you well. Are closed at the toe. Do not wear sandals. If you use a stepladder: Make sure that it is fully opened. Do not climb a closed stepladder. Make sure that both sides of the stepladder are locked into place. Ask someone to hold it for you, if possible. Clearly mark and make sure that you can  see: Any grab bars or handrails. First and last steps. Where the edge of each step is. Use tools that help you move around (mobility aids) if they are needed. These include: Canes. Walkers. Scooters. Crutches. Turn on the lights when you go into a dark area. Replace any light bulbs as soon as they burn out. Set up your furniture so you have a clear path. Avoid moving your furniture around. If any of your floors are uneven, fix them. If there are any pets around you, be aware of where they are. Review your medicines with your doctor. Some medicines can make you feel dizzy. This can increase your chance of falling. Ask your doctor what other things that you can do to help prevent falls. This information is not intended to replace advice given to you by your health care provider. Make sure you discuss any questions you have with your health care provider. Document Released: 12/29/2008 Document Revised: 08/10/2015 Document Reviewed: 04/08/2014 Elsevier Interactive Patient Education  2017 [employer].

## 2020-08-30 ENCOUNTER — Ambulatory Visit: Payer: Medicare Other

## 2020-09-01 DIAGNOSIS — N186 End stage renal disease: Secondary | ICD-10-CM | POA: Diagnosis not present

## 2020-09-01 DIAGNOSIS — Z992 Dependence on renal dialysis: Secondary | ICD-10-CM | POA: Diagnosis not present

## 2020-09-05 DIAGNOSIS — Z992 Dependence on renal dialysis: Secondary | ICD-10-CM | POA: Diagnosis not present

## 2020-09-05 DIAGNOSIS — N186 End stage renal disease: Secondary | ICD-10-CM | POA: Diagnosis not present

## 2020-09-06 ENCOUNTER — Telehealth: Payer: Self-pay

## 2020-09-06 ENCOUNTER — Other Ambulatory Visit: Payer: Self-pay

## 2020-09-06 ENCOUNTER — Ambulatory Visit (INDEPENDENT_AMBULATORY_CARE_PROVIDER_SITE_OTHER): Payer: Medicare Other | Admitting: Nurse Practitioner

## 2020-09-06 ENCOUNTER — Encounter: Payer: Self-pay | Admitting: Nurse Practitioner

## 2020-09-06 VITALS — BP 125/84 | HR 98 | Temp 98.2°F | Wt 130.4 lb

## 2020-09-06 DIAGNOSIS — N186 End stage renal disease: Secondary | ICD-10-CM

## 2020-09-06 DIAGNOSIS — R079 Chest pain, unspecified: Secondary | ICD-10-CM | POA: Diagnosis not present

## 2020-09-06 DIAGNOSIS — I739 Peripheral vascular disease, unspecified: Secondary | ICD-10-CM | POA: Diagnosis not present

## 2020-09-06 DIAGNOSIS — I129 Hypertensive chronic kidney disease with stage 1 through stage 4 chronic kidney disease, or unspecified chronic kidney disease: Secondary | ICD-10-CM | POA: Diagnosis not present

## 2020-09-06 DIAGNOSIS — N184 Chronic kidney disease, stage 4 (severe): Secondary | ICD-10-CM | POA: Diagnosis not present

## 2020-09-06 DIAGNOSIS — G2581 Restless legs syndrome: Secondary | ICD-10-CM | POA: Diagnosis not present

## 2020-09-06 DIAGNOSIS — I4891 Unspecified atrial fibrillation: Secondary | ICD-10-CM

## 2020-09-06 DIAGNOSIS — I4892 Unspecified atrial flutter: Secondary | ICD-10-CM | POA: Insufficient documentation

## 2020-09-06 DIAGNOSIS — D631 Anemia in chronic kidney disease: Secondary | ICD-10-CM

## 2020-09-06 DIAGNOSIS — Z992 Dependence on renal dialysis: Secondary | ICD-10-CM

## 2020-09-06 MED ORDER — APIXABAN 2.5 MG PO TABS: 2.5000 mg | ORAL_TABLET | Freq: Two times a day (BID) | ORAL | 12 refills | Status: AC

## 2020-09-06 NOTE — Assessment & Plan Note (Signed)
Chronic, ongoing, followed by nephrology and obtaining dialysis. Called [facility] to request labs today -- will obtain CBC and CMP in office.

## 2020-09-06 NOTE — Assessment & Plan Note (Signed)
Irregular rate noted on auscultation today -- symptomatic with SOB and mild intermittent CP recently.  EKG noting atrial fibrillation and left axis shift, old infarcts.  At this time will continue Carvedilol 25 MG BID, may need to adjust or utilize alternate.  Start Apixaban 2.5 MG BID, renal dosing for dialysis, and educated her on this + to stop ASA.  Discussed with her and educated on atrial fibrillation.  Urgent referral to cardiology -- she is scheduled for tomorrow.  At this time recommend if any worsening symptoms to immediately go to ER, she does not wish to go there at this time.

## 2020-09-06 NOTE — Assessment & Plan Note (Signed)
Refer to RLS plan, if ongoing discomfort will plan to refer to vascular who she has seen before.

## 2020-09-06 NOTE — Assessment & Plan Note (Signed)
Chronic, ongoing with ESRD.  Continue collaboration with nephrology and current medication regimen. BP at goal for her age today.  Recent labs with dialysis, called [facility] to request these be sent.  Obtain CBC, CMP, TSH today

## 2020-09-06 NOTE — Assessment & Plan Note (Signed)
Chronic, ongoing.  Continue collaboration with dialysis team and nephrology.

## 2020-09-06 NOTE — Assessment & Plan Note (Signed)
Chronic, ongoing.  Receives dialysis.  Continue current medication regimen and collaboration with nephrology team.  Will request dialysis labs, as none noted in Epic to review.  CMP today.

## 2020-09-06 NOTE — Patient Instructions (Addendum)
STOP ASPIRIN AND START APIXABAN (ELIQUIS) 2.5 MG BID.    Atrial Fibrillation  Atrial fibrillation is a type of heartbeat that is irregular or fast. If you have this condition, your heart beats without any order. This makes it hard foryour heart to pump blood in a normal way. Atrial fibrillation may come and go, or it may become a long-lasting problem. If this condition is not treated, it can put you at higher risk for stroke,heart failure, and other heart problems. What are the causes? This condition may be caused by diseases that damage the heart. They include: High blood pressure. Heart failure. Heart valve disease. Heart surgery. Other causes include: Diabetes. Thyroid disease. Being overweight. Kidney disease. Sometimes the cause is not known. What increases the risk? You are more likely to develop this condition if: You are older. You smoke. You exercise often and very hard. You have a family history of this condition. You are a man. You use drugs. You drink a lot of alcohol. You have lung conditions, such as emphysema, pneumonia, or COPD. You have sleep apnea. What are the signs or symptoms? Common symptoms of this condition include: A feeling that your heart is beating very fast. Chest pain or discomfort. Feeling short of breath. Suddenly feeling light-headed or weak. Getting tired easily during activity. Fainting. Sweating. In some cases, there are no symptoms. How is this treated? Treatment for this condition depends on underlying conditions and how you feel when you have atrial fibrillation. They include: Medicines to: Prevent blood clots. Treat heart rate or heart rhythm problems. Using devices, such as a pacemaker, to correct heart rhythm problems. Doing surgery to remove the part of the heart that sends bad signals. Closing an area where clots can form in the heart (left atrial appendage). In some cases, your doctor will treat other underlying  conditions. Follow these instructions at home: Medicines Take over-the-counter and prescription medicines only as told by your doctor. Do not take any new medicines without first talking to your doctor. If you are taking blood thinners: Talk with your doctor before you take any medicines that have aspirin or NSAIDs, such as ibuprofen, in them. Take your medicine exactly as told by your doctor. Take it at the same time each day. Avoid activities that could hurt or bruise you. Follow instructions about how to prevent falls. Wear a bracelet that says you are taking blood thinners. Or, carry a card that lists what medicines you take. Lifestyle     Do not use any products that have nicotine or tobacco in them. These include cigarettes, e-cigarettes, and chewing tobacco. If you need help quitting, ask your doctor. Eat heart-healthy foods. Talk with your doctor about the right eating plan for you. Exercise regularly as told by your doctor. Do not drink alcohol. Lose weight if you are overweight. Do not use drugs, including cannabis. General instructions If you have a condition that causes breathing to stop for a short period of time (apnea), treat it as told by your doctor. Keep a healthy weight. Do not use diet pills unless your doctor says they are safe for you. Diet pills may make heart problems worse. Keep all follow-up visits as told by your doctor. This is important. Contact a doctor if: You notice a change in the speed, rhythm, or strength of your heartbeat. You are taking a blood-thinning medicine and you get more bruising. You get tired more easily when you move or exercise. You have a sudden change in weight.  Get help right away if:  You have pain in your chest or your belly (abdomen). You have trouble breathing. You have side effects of blood thinners, such as blood in your vomit, poop (stool), or pee (urine), or bleeding that cannot stop. You have any signs of a stroke. "BE  FAST" is an easy way to remember the main warning signs: B - Balance. Signs are dizziness, sudden trouble walking, or loss of balance. E - Eyes. Signs are trouble seeing or a change in how you see. F - Face. Signs are sudden weakness or loss of feeling in the face, or the face or eyelid drooping on one side. A - Arms. Signs are weakness or loss of feeling in an arm. This happens suddenly and usually on one side of the body. S - Speech. Signs are sudden trouble speaking, slurred speech, or trouble understanding what people say. T - Time. Time to call emergency services. Write down what time symptoms started. You have other signs of a stroke, such as: A sudden, very bad headache with no known cause. Feeling like you may vomit (nausea). Vomiting. A seizure. These symptoms may be an emergency. Do not wait to see if the symptoms will go away. Get medical help right away. Call your local emergency services (911 in the [place] Do not drive yourself to the hospital. Summary Atrial fibrillation is a type of heartbeat that is irregular or fast. You are at higher risk of this condition if you smoke, are older, have diabetes, or are overweight. Follow your doctor's instructions about medicines, diet, exercise, and follow-up visits. Get help right away if you have signs or symptoms of a stroke. Get help right away if you cannot catch your breath, or you have chest pain or discomfort. This information is not intended to replace advice given to you by your health care provider. Make sure you discuss any questions you have with your healthcare provider. Document Revised: 08/26/2018 Document Reviewed: 08/26/2018 Elsevier Patient Education  [place].

## 2020-09-06 NOTE — Assessment & Plan Note (Signed)
Chronic, ongoing.  No benefit from Gabapentin or Tylenol in past. Continue current Requip dosing, she is using as needed only and noting benefit -- discussed with patient.  Educated on this medication and side effects to monitor for.  Return in 6 months.

## 2020-09-06 NOTE — Assessment & Plan Note (Signed)
Over past few days with intermittent SOB, atrial fibrillation noted on EKG today.  Refer to this plan for further.  Urgent referral to cardiology placed and she is scheduled for tomorrow.  At this time recommend if any worsening symptoms to immediately go to ER, she does not wish to go there at this time.

## 2020-09-06 NOTE — Telephone Encounter (Signed)
Copied from [place] [id number]. Topic: General - Other >> Sep 06, 2020 12:29 PM Tessa Lerner A wrote: Reason for CRM: Patient has called to tell their PCP that a 90 day supply of their apixaban (ELIQUIS) 2.5 MG TABS tablet is $236  Patient did pick up the prescription but is uncertain of their ability to continue paying that price  Please contact further if needed

## 2020-09-06 NOTE — Progress Notes (Signed)
BP 125/84   Pulse 98 Comment: APICAL  Temp 98.2 F (36.8 C) (Oral)   Wt 130 lb 6.4 oz (59.1 kg)   SpO2 98%   BMI 23.10 kg/m    Subjective:    Patient ID: Marissa Hess, female    DOB: [birth year], 86 y.o.   MRN: [id number]  HPI: Marissa Hess is a 86 y.o. female  Chief Complaint  Patient presents with   Hypertension   Chronic Kidney Disease   Restless Leg Syndrome   Chest Pain    Patient states after eating a piece of toast and having a cup of coffee and taking her medications, she noticed chest pains. Patient describes the pain as it just hurts, denies having any sharp or stabbing pain. Patient denies having any pain in her arms.    Shortness of Breath    Patient states she has been having SOB for the past few days and she states after her Dialysis treatment yesterday, she says she was tired and just went to bed.    HYPERTENSION Continues on Carvedilol and ASA.  Has taken medications this morning.  Reports feeling chest pain this morning after eating some toast and drinking coffee.  Reports having SOB for past few days, can not hardly walk without legs getting weak and SOB.  Walks from about office exam room to halfway out hallway and feels SOB.  States CP is dull, 2-3/10, and knows it is there -- no radiation, no nausea, sweats.  Her CP comes and goes, not consistent.  She does feel heart is beating differently, fluttering.  Has history of MI in 2007. Had headache yesterday after dialysis, took Tylenol and this went away.  Denies dizziness, syncope, or heavy feeling on chest.  Does endorse feeling weaker. Hypertension status: stable  Satisfied with current treatment? yes Duration of hypertension: chronic BP monitoring frequency:  rarely BP range:  BP medication side effects:  no Medication compliance: good compliance Aspirin: yes Recurrent headaches: no Visual changes: no Palpitations: yes Dyspnea: yes Chest pain: yes Lower extremity edema: no Dizzy/lightheaded:  no  CHRONIC KIDNEY DISEASE Followed by nephrology, Dr. Holley Raring -- she reports seeing at dialysis regularly.  Is undergoing dialysis under Monday and Friday -- had to go yesterday as were short on staff and could not go Monday.  Has labs performed at dialysis center frequently, had drawn recently per her report.  Attends [employer], no recent labs in [place].  Continues Mirtazapine for appetite with some loss with dialysis -- she reports Dr. Elwyn Lade NP started this recently. CKD status: stable Medications renally dose: yes Previous renal evaluation: yes Pneumovax:  Not up to Date Influenza Vaccine:  Up to Date   RESTLESS LEGS Takes Requip as needed at this time. Duration: chronic Discomfort description:  ill-defined, burning and cramping Pain: yes Location: lower legs Bilateral: yes Symmetric: yes Severity: 0/10 Onset:  gradual Frequency:  intermittent Symptoms only occur while legs at rest: yes Sudden unintentional leg jerking: no Bed partner bothered by leg movements: no LE numbness: no Decreased sensation: no Weakness: no Insomnia: yes with discomfort Daytime somnolence: no Fatigue: no Alleviating factors: nothing Aggravating factors: resting at night Status: fluctuating Treatments attempted: Gabapentin and Tylenol  Relevant past medical, surgical, family and social history reviewed and updated as indicated. Interim medical history since our last visit reviewed. Allergies and medications reviewed and updated.  Review of Systems  Constitutional:  Positive for fatigue. Negative for activity change, appetite change, chills, diaphoresis and fever.  Respiratory:  Positive for shortness of breath. Negative for cough, chest tightness and wheezing.   Cardiovascular:  Positive for chest pain and palpitations. Negative for leg swelling.  Gastrointestinal: Negative.   Neurological: Negative.   Psychiatric/Behavioral: Negative.     Per HPI unless specifically indicated above      Objective:    BP 125/84   Pulse 98 Comment: APICAL  Temp 98.2 F (36.8 C) (Oral)   Wt 130 lb 6.4 oz (59.1 kg)   SpO2 98%   BMI 23.10 kg/m   Wt Readings from Last 3 Encounters:  09/06/20 130 lb 6.4 oz (59.1 kg)  08/28/20 130 lb (59 kg)  03/08/20 130 lb 12.8 oz (59.3 kg)    Physical Exam Vitals and nursing note reviewed.  Constitutional:      General: She is awake. She is not in acute distress.    Appearance: She is well-developed and underweight. She is not ill-appearing or toxic-appearing.     Comments: Frail appearing.  HENT:     Head: Normocephalic and atraumatic.     Right Ear: Hearing normal.     Left Ear: Hearing normal.  Eyes:     General: Lids are normal.        Right eye: No discharge.        Left eye: No discharge.     Conjunctiva/sclera: Conjunctivae normal.     Pupils: Pupils are equal, round, and reactive to light.  Neck:     Vascular: No carotid bruit.  Cardiovascular:     Rate and Rhythm: Normal rate. Rhythm irregular.     Heart sounds: Normal heart sounds. No murmur heard.   No gallop.     Arteriovenous access: Right arteriovenous access is present. Pulmonary:     Effort: Pulmonary effort is normal. No accessory muscle usage or respiratory distress.     Breath sounds: Normal breath sounds.     Comments: Clear throughout with no SOB with talking.  SOB with short distance of walking noted. Abdominal:     General: Bowel sounds are normal.     Palpations: Abdomen is soft.  Musculoskeletal:     Cervical back: Normal range of motion and neck supple.     Right lower leg: No edema.     Left lower leg: No edema.  Skin:    General: Skin is warm and dry.  Neurological:     Mental Status: She is alert and oriented to person, place, and time.     Cranial Nerves: Cranial nerves are intact.     Motor: Motor function is intact.     Coordination: Coordination is intact.     Gait: Gait is intact.     Deep Tendon Reflexes: Reflexes are normal and symmetric.      Reflex Scores:      Brachioradialis reflexes are 2+ on the right side and 2+ on the left side.      Patellar reflexes are 2+ on the right side and 2+ on the left side. Psychiatric:        Attention and Perception: Attention normal.        Mood and Affect: Mood normal.        Speech: Speech normal.        Behavior: Behavior normal. Behavior is cooperative.        Thought Content: Thought content normal.        Judgment: Judgment normal.   EKG My review and personal interpretation at Time: 1030 Indication: CP and  SOB -- irregular HR on exam  Rate: 106 Rhythm: irregular -- atrial fibrillation Axis: left shift Other: nonspecific st abn, old infarct, no lvh   Results for orders placed or performed in visit on 11/26/18  Microscopic Examination   URINE  Result Value Ref Range   WBC, UA 6-10 (A) 0 - 5 /hpf   RBC 0-2 0 - 2 /hpf   Epithelial Cells (non renal) 0-10 0 - 10 /hpf   Casts Present None seen /lpf   Cast Type Hyaline casts N/A   Bacteria, UA Few (A) None seen/Few  Urine Culture, Reflex   URINE  Result Value Ref Range   Urine Culture, Routine Final report    Organism ID, Bacteria Comment   CBC with Differential/Platelet  Result Value Ref Range   WBC 5.1 3.4 - 10.8 x10E3/uL   RBC 3.91 3.77 - 5.28 x10E6/uL   Hemoglobin 11.2 11.1 - 15.9 g/dL   Hematocrit 34.4 34.0 - 46.6 %   MCV 88 79 - 97 fL   MCH 28.6 26.6 - 33.0 pg   MCHC 32.6 31.5 - 35.7 g/dL   RDW 14.9 11.7 - 15.4 %   Platelets 207 150 - 450 x10E3/uL   Neutrophils 63 Not Estab. %   Lymphs 21 Not Estab. %   Monocytes 12 Not Estab. %   Eos 3 Not Estab. %   Basos 1 Not Estab. %   Neutrophils Absolute 3.2 1.4 - 7.0 x10E3/uL   Lymphocytes Absolute 1.1 0.7 - 3.1 x10E3/uL   Monocytes Absolute 0.6 0.1 - 0.9 x10E3/uL   EOS (ABSOLUTE) 0.1 0.0 - 0.4 x10E3/uL   Basophils Absolute 0.1 0.0 - 0.2 x10E3/uL   Immature Granulocytes 0 Not Estab. %   Immature Grans (Abs) 0.0 0.0 - 0.1 x10E3/uL  Comprehensive metabolic panel  Result  Value Ref Range   Glucose 72 65 - 99 mg/dL   BUN 33 (H) 8 - 27 mg/dL   Creatinine, Ser 2.48 (H) 0.57 - 1.00 mg/dL   GFR calc non Af Amer 18 (L) >59 mL/min/1.73   GFR calc Af Amer 20 (L) >59 mL/min/1.73   BUN/Creatinine Ratio 13 12 - 28   Sodium 140 134 - 144 mmol/L   Potassium 3.7 3.5 - 5.2 mmol/L   Chloride 98 96 - 106 mmol/L   CO2 28 20 - 29 mmol/L   Calcium 8.8 8.7 - 10.3 mg/dL   Total Protein 6.9 6.0 - 8.5 g/dL   Albumin 3.4 (L) 3.6 - 4.6 g/dL   Globulin, Total 3.5 1.5 - 4.5 g/dL   Albumin/Globulin Ratio 1.0 (L) 1.2 - 2.2   Bilirubin Total 0.5 0.0 - 1.2 mg/dL   Alkaline Phosphatase 78 39 - 117 IU/L   AST 11 0 - 40 IU/L   ALT 7 0 - 32 IU/L  Lipid Panel w/o Chol/HDL Ratio  Result Value Ref Range   Cholesterol, Total 187 100 - 199 mg/dL   Triglycerides 110 0 - 149 mg/dL   HDL 51 >39 mg/dL   VLDL Cholesterol Cal 20 5 - 40 mg/dL   LDL Chol Calc (NIH) 116 (H) 0 - 99 mg/dL  TSH  Result Value Ref Range   TSH 3.570 0.450 - 4.500 uIU/mL  UA/M w/rflx Culture, Routine   Specimen: Urine   URINE  Result Value Ref Range   Specific Gravity, UA 1.015 1.005 - 1.030   pH, UA 7.5 5.0 - 7.5   Color, UA Yellow Yellow   Appearance Ur Clear Clear  Leukocytes,UA 1+ (A) Negative   Protein,UA 2+ (A) Negative/Trace   Glucose, UA Negative Negative   Ketones, UA Negative Negative   RBC, UA Trace (A) Negative   Bilirubin, UA Negative Negative   Urobilinogen, Ur 0.2 0.2 - 1.0 mg/dL   Nitrite, UA Negative Negative   Microscopic Examination See below:    Urinalysis Reflex Comment       Assessment & Plan:   Problem List Items Addressed This Visit       Cardiovascular and Mediastinum   Atrial fibrillation ([place]) - Primary    Irregular rate noted on auscultation today -- symptomatic with SOB and mild intermittent CP recently.  EKG noting atrial fibrillation and left axis shift, old infarcts.  At this time will continue Carvedilol 25 MG BID, may need to adjust or utilize alternate.  Start  Apixaban 2.5 MG BID, renal dosing for dialysis, and educated her on this + to stop ASA.  Discussed with her and educated on atrial fibrillation.  Urgent referral to cardiology -- she is scheduled for tomorrow.  At this time recommend if any worsening symptoms to immediately go to ER, she does not wish to go there at this time.       Relevant Medications   apixaban (ELIQUIS) 2.5 MG TABS tablet   Other Relevant Orders   Ambulatory referral to Cardiology   CBC with Differential/Platelet   Comprehensive metabolic panel   TSH     Genitourinary   Hypertensive nephropathy    Chronic, ongoing with ESRD.  Continue collaboration with nephrology and current medication regimen. BP at goal for her age today.  Recent labs with dialysis, called [facility] to request these be sent.  Obtain CBC, CMP, TSH today       Anemia associated with stage 4 chronic renal failure (HCC)    Chronic, ongoing, followed by nephrology and obtaining dialysis. Called [facility] to request labs today -- will obtain CBC and CMP in office.         ESRD (end stage renal disease) (HCC)    Chronic, ongoing.  Receives dialysis.  Continue current medication regimen and collaboration with nephrology team.  Will request dialysis labs, as none noted in Epic to review.  CMP today.         Other   Restless leg syndrome    Chronic, ongoing.  No benefit from Gabapentin or Tylenol in past. Continue current Requip dosing, she is using as needed only and noting benefit -- discussed with patient.  Educated on this medication and side effects to monitor for.  Return in 6 months.       Claudication of both lower extremities ([place])    Refer to RLS plan, if ongoing discomfort will plan to refer to vascular who she has seen before.       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  Continue collaboration with dialysis team and nephrology.       Chest pain    Over  past few days with intermittent SOB, atrial fibrillation noted on EKG today.  Refer to this plan for further.  Urgent referral to cardiology placed and she is scheduled for tomorrow.  At this time recommend if any worsening symptoms to immediately go to ER, she does not wish to go there at this time.       Relevant Orders   EKG 12-Lead (Completed)   Ambulatory referral to Cardiology  Follow up plan: Return in about 1 week (around 09/13/2020) for ATRIAL FIBRILLATION.

## 2020-09-07 ENCOUNTER — Encounter: Payer: Self-pay | Admitting: Cardiology

## 2020-09-07 ENCOUNTER — Ambulatory Visit: Payer: Medicare Other | Admitting: Cardiology

## 2020-09-07 VITALS — BP 140/70 | HR 105 | Ht 63.0 in | Wt 131.5 lb

## 2020-09-07 DIAGNOSIS — I10 Essential (primary) hypertension: Secondary | ICD-10-CM | POA: Diagnosis not present

## 2020-09-07 DIAGNOSIS — I4892 Unspecified atrial flutter: Secondary | ICD-10-CM | POA: Diagnosis not present

## 2020-09-07 LAB — COMPREHENSIVE METABOLIC PANEL
ALT: 5 IU/L (ref 0–32)
AST: 11 IU/L (ref 0–40)
Albumin/Globulin Ratio: 0.9 — ABNORMAL LOW (ref 1.2–2.2)
Albumin: 3.4 g/dL — ABNORMAL LOW (ref 3.6–4.6)
Alkaline Phosphatase: 53 IU/L (ref 44–121)
BUN/Creatinine Ratio: 5 — ABNORMAL LOW (ref 12–28)
BUN: 12 mg/dL (ref 8–27)
Bilirubin Total: 1.2 mg/dL (ref 0.0–1.2)
CO2: 27 mmol/L (ref 20–29)
Calcium: 9.1 mg/dL (ref 8.7–10.3)
Chloride: 97 mmol/L (ref 96–106)
Creatinine, Ser: 2.39 mg/dL — ABNORMAL HIGH (ref 0.57–1.00)
Globulin, Total: 3.6 g/dL (ref 1.5–4.5)
Glucose: 79 mg/dL (ref 65–99)
Potassium: 3.7 mmol/L (ref 3.5–5.2)
Sodium: 140 mmol/L (ref 134–144)
Total Protein: 7 g/dL (ref 6.0–8.5)
eGFR: 20 mL/min/{1.73_m2} — ABNORMAL LOW (ref >59)

## 2020-09-07 LAB — CBC WITH DIFFERENTIAL/PLATELET
Basophils Absolute: 0 10*3/uL (ref 0.0–0.2)
Basos: 1 %
EOS (ABSOLUTE): 0.1 10*3/uL (ref 0.0–0.4)
Eos: 2 %
Hematocrit: 33.5 % — ABNORMAL LOW (ref 34.0–46.6)
Hemoglobin: 10.9 g/dL — ABNORMAL LOW (ref 11.1–15.9)
Immature Grans (Abs): 0 10*3/uL (ref 0.0–0.1)
Immature Granulocytes: 0 %
Lymphocytes Absolute: 1.1 10*3/uL (ref 0.7–3.1)
Lymphs: 19 %
MCH: 28.1 pg (ref 26.6–33.0)
MCHC: 32.5 g/dL (ref 31.5–35.7)
MCV: 86 fL (ref 79–97)
Monocytes Absolute: 0.6 10*3/uL (ref 0.1–0.9)
Monocytes: 9 %
Neutrophils Absolute: 4.1 10*3/uL (ref 1.4–7.0)
Neutrophils: 69 %
Platelets: 201 10*3/uL (ref 150–450)
RBC: 3.88 x10E6/uL (ref 3.77–5.28)
RDW: 17.9 % — ABNORMAL HIGH (ref 11.7–15.4)
WBC: 5.9 10*3/uL (ref 3.4–10.8)

## 2020-09-07 LAB — TSH: TSH: 3.61 u[IU]/mL (ref 0.450–4.500)

## 2020-09-07 NOTE — Progress Notes (Signed)
Please let Marissa Hess know that her labs have returned and showed her baseline anemia and kidney disease.  Thyroid was normal.  If any questions let me know.  Have a great day!! Keep being awesome!!  Thank you for allowing me to participate in your care.  I appreciate you. Kindest regards, Tniya Bowditch

## 2020-09-07 NOTE — Patient Instructions (Signed)
Medication Instructions:   Your physician recommends that you continue on your current medications as directed. Please refer to the Current Medication list given to you today.  *If you need a refill on your cardiac medications before your next appointment, please call your pharmacy*   Lab Work:  Your physician recommends that you return for lab work in: [place] (09/25/20-09/29/20)   - Please go to the [facility]. You will check in at the front desk to the right as you walk into the atrium. Valet Parking is offered if needed. - No appointment needed. You may go any day between 7 am and 6 pm.      Testing/Procedures:   Your physician has requested that you have an echocardiogram. Echocardiography is a painless test that uses sound waves to create images of your heart. It provides your doctor with information about the size and shape of your heart and how well your heart's chambers and valves are working. This procedure takes approximately one hour. There are no restrictions for this procedure.   2.  You are scheduled for a Cardioversion on ____7/20/22____________ with Dr.__Agbor-Etang_________  Please arrive at the [facility] at ___0630______ a.m. on the day of your procedure.      DIET INSTRUCTIONS:   Nothing to eat or drink after midnight except your medications with  sip of water. BE SURE TO TAKE YOUR ELIQUIS.         Labs: _See lab instruction above under Lab_Work________________  Medications:  YOU MAY TAKE ALL of your remaining medications with a small amount of water.  Must have a responsible person to drive you home.  Bring a current list of your medications and current insurance cards.    If you have any questions after you get home, please call the office at [phone]   Follow-Up: At [facility], you and your health needs are our priority.  As part of our continuing mission to provide you with exceptional heart  care, we have created designated Provider Care Teams.  These Care Teams include your primary Cardiologist (physician) and Advanced Practice Providers (APPs -  Physician Assistants and Nurse Practitioners) who all work together to provide you with the care you need, when you need it.  We recommend signing up for the patient portal called "MyChart".  Sign up information is provided on this After Visit Summary.  MyChart is used to connect with patients for Virtual Visits (Telemedicine).  Patients are able to view lab/test results, encounter notes, upcoming appointments, etc.  Non-urgent messages can be sent to your provider as well.   To learn more about what you can do with MyChart, go to [email or web address].    Your next appointment:   Two weeks after Cardioversion on 10/04/20 (8/ 3/22)  The format for your next appointment:   In Person  Provider:   Kate Sable, MD   Other Instructions

## 2020-09-07 NOTE — Progress Notes (Signed)
Cardiology Office Note:    Date:  09/07/2020   ID:  Marissa Hess, DOB 11-08-1934, MRN [id number]  PCP:  Venita Lick, NP   [facility] Providers Cardiologist:  None     Referring MD: Venita Lick, NP   Chief Complaint  Patient presents with   New Patient (Initial Visit)    Ref by Marnee Guarneri, NP for chest pain and new onset A-Fib.  Patient c/o chest pain and shortness of breath. Medications reviewed by the patient verbally.     History of Present Illness:    Marissa Hess is a 86 y.o. female with a hx of hypertension, ESRD on HD M,F who presents due to atrial fibrillation.  Patient has episodes of shortness of breath ongoing over the past 3 weeks, had sharp  chest pain yesterday which prompted visit to primary care provider.  Saw primary care provider yesterday where irregular heart rate was noted on physical exam.  EKG reviewed by myself showed A.flutter, heart rate 108, possible old septal infarct.  She states having an old heart attack back in 2007 at [facility].  No stents were placed.  Denies any other history of heart disease.  Past Medical History:  Diagnosis Date   Allergy    Arthritis    Chronic kidney disease    CKD V   GERD (gastroesophageal reflux disease)    Hypertension    Lupus ([place]) 2002   Macular degeneration of left eye    Myocardial infarction [facility]) 2007   Osteoporosis     Past Surgical History:  Procedure Laterality Date   A/V FISTULAGRAM Right 01/16/2018   Procedure: A/V FISTULAGRAM;  Surgeon: [name], MD;  Location: [place] [facility];  Service: Cardiovascular;  Laterality: Right;   ABDOMINAL HYSTERECTOMY     AV FISTULA PLACEMENT Right 12/17/2017   Procedure: ARTERIOVENOUS (AV) FISTULA CREATION ( BRACHIO CEPHALIC POSS. BRACHIO BASILIC );  Surgeon: Algernon Huxley, MD;  Location: [facility];  Service: Vascular;  Laterality: Right;   CATARACT EXTRACTION W/PHACO Right 01/03/2016   Procedure: CATARACT EXTRACTION PHACO AND INTRAOCULAR  LENS PLACEMENT ([place]);  Surgeon: Leandrew Koyanagi, MD;  [facility]: [facility];  Service: Ophthalmology;  Laterality: Right;   COLON SURGERY     intestine became twisted after hernia sugery   EYE SURGERY     cataract   HERNIA REPAIR     TEMPORAL ARTERY BIOPSY / LIGATION     TONSILLECTOMY      Current Medications: Current Meds  Medication Sig   apixaban (ELIQUIS) 2.5 MG TABS tablet Take 1 tablet (2.5 mg total) by mouth 2 (two) times daily.   carvedilol (COREG) 25 MG tablet Take 1 tablet (25 mg total) by mouth 2 (two) times daily with a meal.   cholecalciferol (VITAMIN D) 1000 units tablet Take 1,000 Units by mouth daily.    ferrous sulfate (SLOW FE) 160 (50 Fe) MG TBCR SR tablet Take 1 tablet by mouth daily.    folic acid (FOLVITE) 1 MG tablet Take 1 tablet (1 mg total) by mouth daily.   mirtazapine (REMERON) 7.5 MG tablet Take 7.5 mg by mouth at bedtime.   rOPINIRole (REQUIP) 0.5 MG tablet Take one tablet (0.5 MG) by mouth in the morning and then take two tablets (1 MG) by mouth at night before bedtime.     Allergies:   Codeine, Ibuprofen, Sulfa antibiotics, Vioxx [rofecoxib], Celebrex [celecoxib], and Penicillins   Social History   Socioeconomic History   Marital  status: Married    Spouse name: Not on file   Number of children: Not on file   Years of education: Not on file   Highest education level: High school graduate  Occupational History    Comment: Recruitment consultant; [employer]; child care  Tobacco Use   Smoking status: Former    Pack years: 0.00    Types: Cigarettes    Start date: 09/11/1984    Quit date: 09/26/1994    Years since quitting: 25.9   Smokeless tobacco: Never  Vaping Use   Vaping Use: Never used  Substance and Sexual Activity   Alcohol use: No    Alcohol/week: 0.0 standard drinks   Drug use: No   Sexual activity: Not on file  Other Topics Concern   Not on file  Social History Narrative   Not on file   Social Determinants of Health   Financial  Resource Strain: Low Risk    Difficulty of Paying Living Expenses: Not hard at all  Food Insecurity: No Food Insecurity   Worried About Charity fundraiser in the Last Year: Never true   [place] in the Last Year: Never true  Transportation Needs: No Transportation Needs   Lack of Transportation (Medical): No   Lack of Transportation (Non-Medical): No  Physical Activity: Inactive   Days of Exercise per Week: 0 days   Minutes of Exercise per Session: 0 min  Stress: No Stress Concern Present   Feeling of Stress : Not at all  Social Connections: Not on file     Family History: The patient's family history includes Rheum arthritis in her father; Stomach cancer in her mother; Thyroid disease in her daughter; Varicose Veins in her maternal aunt. There is no history of Breast cancer.  ROS:   Please see the history of present illness.     All other systems reviewed and are negative.  EKGs/Labs/Other Studies Reviewed:    The following studies were reviewed today:   EKG:  EKG is  ordered today.  The ekg ordered today demonstrates atrial flutter, heart rate 105  Recent Labs: 09/06/2020: ALT 5; BUN 12; Creatinine, Ser 2.39; Hemoglobin 10.9; Platelets 201; Potassium 3.7; Sodium 140; TSH 3.610  Recent Lipid Panel    Component Value Date/Time   CHOL 187 11/26/2018 1346   TRIG 110 11/26/2018 1346   HDL 51 11/26/2018 1346   CHOLHDL 6.5 (H) 10/08/2017 1304   LDLCALC 116 (H) 11/26/2018 1346     Risk Assessment/Calculations:          Physical Exam:    VS:  BP 140/70 (BP Location: Left Arm, Patient Position: Sitting, Cuff Size: Normal)   Pulse (!) 105   Ht '5\' 3"'$  (1.6 m)   Wt 131 lb 8 oz (59.6 kg)   SpO2 97%   BMI 23.29 kg/m     Wt Readings from Last 3 Encounters:  09/07/20 131 lb 8 oz (59.6 kg)  09/06/20 130 lb 6.4 oz (59.1 kg)  08/28/20 130 lb (59 kg)     GEN:  Well nourished, well developed in no acute distress HEENT: Normal NECK: No JVD; No carotid  bruits LYMPHATICS: No lymphadenopathy CARDIAC: Irregularly irregular, no murmurs RESPIRATORY:  Clear to auscultation without rales, wheezing or rhonchi  ABDOMEN: Soft, non-tender, non-distended MUSCULOSKELETAL:  No edema; left arm AV fistula noted. SKIN: Warm and dry NEUROLOGIC:  Alert and oriented x 3 PSYCHIATRIC:  Normal affect   ASSESSMENT:    1. Atrial flutter, unspecified type (  [place])   2. Primary hypertension    PLAN:    In order of problems listed above:  Atrial flutter, CHA2DS2-VASc of 4 (age, htn, gender), heart rate 105.  Endorses shortness of breath.  Get echo to evaluate systolic and diastolic function.  TEE/cardioversion discussed with patient, patient would like to avoid TEE.  Plan for at least 3 weeks of anticoagulation with Eliquis, cardioversion to follow.  Continue Coreg, Eliquis.  Patient would also like to discuss cardioversion with daughter. Hypertension, continue Coreg.  Follow-up after cardioversion.   Shared Decision Making/Informed Consent The risks (stroke, cardiac arrhythmias rarely resulting in the need for a temporary or permanent pacemaker, skin irritation or burns and complications associated with conscious sedation including aspiration, arrhythmia, respiratory failure and death), benefits (restoration of normal sinus rhythm) and alternatives of a direct current cardioversion were explained in detail to Ms. Frysinger and she agrees to proceed.      Medication Adjustments/Labs and Tests Ordered: Current medicines are reviewed at length with the patient today.  Concerns regarding medicines are outlined above.  Orders Placed This Encounter  Procedures   Basic metabolic panel   CBC   EKG 12-Lead   ECHOCARDIOGRAM COMPLETE    No orders of the defined types were placed in this encounter.   Patient Instructions  Medication Instructions:   Your physician recommends that you continue on your current medications as directed. Please refer to the Current  Medication list given to you today.  *If you need a refill on your cardiac medications before your next appointment, please call your pharmacy*   Lab Work:  Your physician recommends that you return for lab work in: [place] (09/25/20-09/29/20)   - Please go to the [facility]. You will check in at the front desk to the right as you walk into the atrium. Valet Parking is offered if needed. - No appointment needed. You may go any day between 7 am and 6 pm.      Testing/Procedures:   Your physician has requested that you have an echocardiogram. Echocardiography is a painless test that uses sound waves to create images of your heart. It provides your doctor with information about the size and shape of your heart and how well your heart's chambers and valves are working. This procedure takes approximately one hour. There are no restrictions for this procedure.   2.  You are scheduled for a Cardioversion on ____7/20/22____________ with Dr.__Agbor-Etang_________  Please arrive at the [facility] at ___0630______ a.m. on the day of your procedure.      DIET INSTRUCTIONS:   Nothing to eat or drink after midnight except your medications with  sip of water. BE SURE TO TAKE YOUR ELIQUIS.         Labs: _See lab instruction above under Lab_Work________________  Medications:  YOU MAY TAKE ALL of your remaining medications with a small amount of water.  Must have a responsible person to drive you home.  Bring a current list of your medications and current insurance cards.    If you have any questions after you get home, please call the office at [phone]   Follow-Up: At [facility], you and your health needs are our priority.  As part of our continuing mission to provide you with exceptional heart care, we have created designated Provider Care Teams.  These Care Teams include your primary Cardiologist (physician) and Advanced Practice  Providers (APPs -  Physician Assistants and  Nurse Practitioners) who all work together to provide you with the care you need, when you need it.  We recommend signing up for the patient portal called "MyChart".  Sign up information is provided on this After Visit Summary.  MyChart is used to connect with patients for Virtual Visits (Telemedicine).  Patients are able to view lab/test results, encounter notes, upcoming appointments, etc.  Non-urgent messages can be sent to your provider as well.   To learn more about what you can do with MyChart, go to [email or web address].    Your next appointment:   Two weeks after Cardioversion on 10/04/20 (8/ 3/22)  The format for your next appointment:   In Person  Provider:   Kate Sable, MD   Other Instructions    Signed, Kate Sable, MD  09/07/2020 1:17 PM    [place]

## 2020-09-07 NOTE — H&P (View-Only) (Signed)
Cardiology Office Note:    Date:  09/07/2020   ID:  Marissa Hess, DOB 18-Jan-1935, MRN [id number]  PCP:  Venita Lick, NP   [facility] Providers Cardiologist:  None     Referring MD: Venita Lick, NP   Chief Complaint  Patient presents with   New Patient (Initial Visit)    Ref by Marnee Guarneri, NP for chest pain and new onset A-Fib.  Patient c/o chest pain and shortness of breath. Medications reviewed by the patient verbally.     History of Present Illness:    Marissa Hess is a 86 y.o. female with a hx of hypertension, ESRD on HD M,F who presents due to atrial fibrillation.  Patient has episodes of shortness of breath ongoing over the past 3 weeks, had sharp  chest pain yesterday which prompted visit to primary care provider.  Saw primary care provider yesterday where irregular heart rate was noted on physical exam.  EKG reviewed by myself showed A.flutter, heart rate 108, possible old septal infarct.  She states having an old heart attack back in 2007 at [facility].  No stents were placed.  Denies any other history of heart disease.  Past Medical History:  Diagnosis Date   Allergy    Arthritis    Chronic kidney disease    CKD V   GERD (gastroesophageal reflux disease)    Hypertension    Lupus ([place]) 2002   Macular degeneration of left eye    Myocardial infarction [facility]) 2007   Osteoporosis     Past Surgical History:  Procedure Laterality Date   A/V FISTULAGRAM Right 01/16/2018   Procedure: A/V FISTULAGRAM;  Surgeon: [name], MD;  Location: [place] [facility];  Service: Cardiovascular;  Laterality: Right;   ABDOMINAL HYSTERECTOMY     AV FISTULA PLACEMENT Right 12/17/2017   Procedure: ARTERIOVENOUS (AV) FISTULA CREATION ( BRACHIO CEPHALIC POSS. BRACHIO BASILIC );  Surgeon: Algernon Huxley, MD;  Location: [facility] OR[facility];  Service: Vascular;  Laterality: Right;   CATARACT EXTRACTION W/PHACO Right 01/03/2016   Procedure: CATARACT EXTRACTION PHACO AND INTRAOCULAR  LENS PLACEMENT ([place]);  Surgeon: Leandrew Koyanagi, MD;  Location: [place];  Service: Ophthalmology;  Laterality: Right;   COLON SURGERY     intestine became twisted after hernia sugery   EYE SURGERY     cataract   HERNIA REPAIR     TEMPORAL ARTERY BIOPSY / LIGATION     TONSILLECTOMY      Current Medications: Current Meds  Medication Sig   apixaban (ELIQUIS) 2.5 MG TABS tablet Take 1 tablet (2.5 mg total) by mouth 2 (two) times daily.   carvedilol (COREG) 25 MG tablet Take 1 tablet (25 mg total) by mouth 2 (two) times daily with a meal.   cholecalciferol (VITAMIN D) 1000 units tablet Take 1,000 Units by mouth daily.    ferrous sulfate (SLOW FE) 160 (50 Fe) MG TBCR SR tablet Take 1 tablet by mouth daily.    folic acid (FOLVITE) 1 MG tablet Take 1 tablet (1 mg total) by mouth daily.   mirtazapine (REMERON) 7.5 MG tablet Take 7.5 mg by mouth at bedtime.   rOPINIRole (REQUIP) 0.5 MG tablet Take one tablet (0.5 MG) by mouth in the morning and then take two tablets (1 MG) by mouth at night before bedtime.     Allergies:   Codeine, Ibuprofen, Sulfa antibiotics, Vioxx [rofecoxib], Celebrex [celecoxib], and Penicillins   Social History   Socioeconomic History   Marital  status: Married    Spouse name: Not on file   Number of children: Not on file   Years of education: Not on file   Highest education level: High school graduate  Occupational History    Comment: Recruitment consultant; [employer]; child care  Tobacco Use   Smoking status: Former    Pack years: 0.00    Types: Cigarettes    Start date: 09/11/1984    Quit date: 09/26/1994    Years since quitting: 25.9   Smokeless tobacco: Never  Vaping Use   Vaping Use: Never used  Substance and Sexual Activity   Alcohol use: No    Alcohol/week: 0.0 standard drinks   Drug use: No   Sexual activity: Not on file  Other Topics Concern   Not on file  Social History Narrative   Not on file   Social Determinants of Health   Financial  Resource Strain: Low Risk    Difficulty of Paying Living Expenses: Not hard at all  Food Insecurity: No Food Insecurity   Worried About Charity fundraiser in the Last Year: Never true   [place] in the Last Year: Never true  Transportation Needs: No Transportation Needs   Lack of Transportation (Medical): No   Lack of Transportation (Non-Medical): No  Physical Activity: Inactive   Days of Exercise per Week: 0 days   Minutes of Exercise per Session: 0 min  Stress: No Stress Concern Present   Feeling of Stress : Not at all  Social Connections: Not on file     Family History: The patient's family history includes Rheum arthritis in her father; Stomach cancer in her mother; Thyroid disease in her daughter; Varicose Veins in her maternal aunt. There is no history of Breast cancer.  ROS:   Please see the history of present illness.     All other systems reviewed and are negative.  EKGs/Labs/Other Studies Reviewed:    The following studies were reviewed today:   EKG:  EKG is  ordered today.  The ekg ordered today demonstrates atrial flutter, heart rate 105  Recent Labs: 09/06/2020: ALT 5; BUN 12; Creatinine, Ser 2.39; Hemoglobin 10.9; Platelets 201; Potassium 3.7; Sodium 140; TSH 3.610  Recent Lipid Panel    Component Value Date/Time   CHOL 187 11/26/2018 1346   TRIG 110 11/26/2018 1346   HDL 51 11/26/2018 1346   CHOLHDL 6.5 (H) 10/08/2017 1304   LDLCALC 116 (H) 11/26/2018 1346     Risk Assessment/Calculations:          Physical Exam:    VS:  BP 140/70 (BP Location: Left Arm, Patient Position: Sitting, Cuff Size: Normal)   Pulse (!) 105   Ht '5\' 3"'$  (1.6 m)   Wt 131 lb 8 oz (59.6 kg)   SpO2 97%   BMI 23.29 kg/m     Wt Readings from Last 3 Encounters:  09/07/20 131 lb 8 oz (59.6 kg)  09/06/20 130 lb 6.4 oz (59.1 kg)  08/28/20 130 lb (59 kg)     GEN:  Well nourished, well developed in no acute distress HEENT: Normal NECK: No JVD; No carotid  bruits LYMPHATICS: No lymphadenopathy CARDIAC: Irregularly irregular, no murmurs RESPIRATORY:  Clear to auscultation without rales, wheezing or rhonchi  ABDOMEN: Soft, non-tender, non-distended MUSCULOSKELETAL:  No edema; left arm AV fistula noted. SKIN: Warm and dry NEUROLOGIC:  Alert and oriented x 3 PSYCHIATRIC:  Normal affect   ASSESSMENT:    1. Atrial flutter, unspecified type (  [place])   2. Primary hypertension    PLAN:    In order of problems listed above:  Atrial flutter, CHA2DS2-VASc of 4 (age, htn, gender), heart rate 105.  Endorses shortness of breath.  Get echo to evaluate systolic and diastolic function.  TEE/cardioversion discussed with patient, patient would like to avoid TEE.  Plan for at least 3 weeks of anticoagulation with Eliquis, cardioversion to follow.  Continue Coreg, Eliquis.  Patient would also like to discuss cardioversion with daughter. Hypertension, continue Coreg.  Follow-up after cardioversion.   Shared Decision Making/Informed Consent The risks (stroke, cardiac arrhythmias rarely resulting in the need for a temporary or permanent pacemaker, skin irritation or burns and complications associated with conscious sedation including aspiration, arrhythmia, respiratory failure and death), benefits (restoration of normal sinus rhythm) and alternatives of a direct current cardioversion were explained in detail to Ms. Cummiskey and she agrees to proceed.      Medication Adjustments/Labs and Tests Ordered: Current medicines are reviewed at length with the patient today.  Concerns regarding medicines are outlined above.  Orders Placed This Encounter  Procedures   Basic metabolic panel   CBC   EKG 12-Lead   ECHOCARDIOGRAM COMPLETE    No orders of the defined types were placed in this encounter.   Patient Instructions  Medication Instructions:   Your physician recommends that you continue on your current medications as directed. Please refer to the Current  Medication list given to you today.  *If you need a refill on your cardiac medications before your next appointment, please call your pharmacy*   Lab Work:  Your physician recommends that you return for lab work in: [place] (09/25/20-09/29/20)   - Please go to the [facility]. You will check in at the front desk to the right as you walk into the atrium. Valet Parking is offered if needed. - No appointment needed. You may go any day between 7 am and 6 pm.      Testing/Procedures:   Your physician has requested that you have an echocardiogram. Echocardiography is a painless test that uses sound waves to create images of your heart. It provides your doctor with information about the size and shape of your heart and how well your heart's chambers and valves are working. This procedure takes approximately one hour. There are no restrictions for this procedure.   2.  You are scheduled for a Cardioversion on ____7/20/22____________ with Dr.__Agbor-Etang_________  Please arrive at the [place] [facility] at ___0630______ a.m. on the day of your procedure.      DIET INSTRUCTIONS:   Nothing to eat or drink after midnight except your medications with  sip of water. BE SURE TO TAKE YOUR ELIQUIS.         Labs: _See lab instruction above under Lab_Work________________  Medications:  YOU MAY TAKE ALL of your remaining medications with a small amount of water.  Must have a responsible person to drive you home.  Bring a current list of your medications and current insurance cards.    If you have any questions after you get home, please call the office at [phone]   Follow-Up: At [facility], you and your health needs are our priority.  As part of our continuing mission to provide you with exceptional heart care, we have created designated Provider Care Teams.  These Care Teams include your primary Cardiologist (physician) and Advanced Practice  Providers (APPs -  Physician Assistants and  Nurse Practitioners) who all work together to provide you with the care you need, when you need it.  We recommend signing up for the patient portal called "MyChart".  Sign up information is provided on this After Visit Summary.  MyChart is used to connect with patients for Virtual Visits (Telemedicine).  Patients are able to view lab/test results, encounter notes, upcoming appointments, etc.  Non-urgent messages can be sent to your provider as well.   To learn more about what you can do with MyChart, go to [email or web address].    Your next appointment:   Two weeks after Cardioversion on 10/04/20 (8/ 3/22)  The format for your next appointment:   In Person  Provider:   Kate Sable, MD   Other Instructions    Signed, Kate Sable, MD  09/07/2020 1:17 PM    [place]

## 2020-09-08 ENCOUNTER — Telehealth: Payer: Self-pay | Admitting: Pharmacist

## 2020-09-08 DIAGNOSIS — Z992 Dependence on renal dialysis: Secondary | ICD-10-CM | POA: Diagnosis not present

## 2020-09-08 DIAGNOSIS — N186 End stage renal disease: Secondary | ICD-10-CM | POA: Diagnosis not present

## 2020-09-08 NOTE — Telephone Encounter (Signed)
My team will reach out to see what we can do. Unfortunately, Eliquis patient assistance requires 3% of total income spent on prescriptions before they will approve.

## 2020-09-08 NOTE — Chronic Care Management (AMB) (Signed)
    Chronic Care Management Pharmacy Assistant   Name: [name]  MRN: [id number] DOB: 01/30/1935   Reason for Encounter: Patient Assistance appplication for Eliquis   Medications: Outpatient Encounter Medications as of 09/08/2020  Medication Sig   apixaban (ELIQUIS) 2.5 MG TABS tablet Take 1 tablet (2.5 mg total) by mouth 2 (two) times daily.   carvedilol (COREG) 25 MG tablet Take 1 tablet (25 mg total) by mouth 2 (two) times daily with a meal.   cholecalciferol (VITAMIN D) 1000 units tablet Take 1,000 Units by mouth daily.    ferrous sulfate (SLOW FE) 160 (50 Fe) MG TBCR SR tablet Take 1 tablet by mouth daily.    folic acid (FOLVITE) 1 MG tablet Take 1 tablet (1 mg total) by mouth daily.   mirtazapine (REMERON) 7.5 MG tablet Take 7.5 mg by mouth at bedtime.   rOPINIRole (REQUIP) 0.5 MG tablet Take one tablet (0.5 MG) by mouth in the morning and then take two tablets (1 MG) by mouth at night before bedtime.   No facility-administered encounter medications on file as of 09/08/2020.   Completed and mailed [place] patient assistance application for Eliquis for the patient. I have insert directions for patient to complete the form on highlighted areas. I have also contacted her pharmacy for the out of pocket expense report her pharmacy stated the patient has to go to the pharmacy to request that with her ID I have set directions for the patient in the mailed envelope for what is being requested since I could not reach the patient.  [name], [place]

## 2020-09-11 DIAGNOSIS — Z992 Dependence on renal dialysis: Secondary | ICD-10-CM | POA: Diagnosis not present

## 2020-09-11 DIAGNOSIS — N186 End stage renal disease: Secondary | ICD-10-CM | POA: Diagnosis not present

## 2020-09-11 DIAGNOSIS — N2581 Secondary hyperparathyroidism of renal origin: Secondary | ICD-10-CM | POA: Diagnosis not present

## 2020-09-13 ENCOUNTER — Other Ambulatory Visit: Payer: Self-pay

## 2020-09-13 ENCOUNTER — Ambulatory Visit (INDEPENDENT_AMBULATORY_CARE_PROVIDER_SITE_OTHER): Payer: Medicare Other | Admitting: Nurse Practitioner

## 2020-09-13 ENCOUNTER — Encounter: Payer: Self-pay | Admitting: Nurse Practitioner

## 2020-09-13 VITALS — BP 128/78 | HR 71 | Temp 98.3°F | Wt 130.2 lb

## 2020-09-13 DIAGNOSIS — I484 Atypical atrial flutter: Secondary | ICD-10-CM

## 2020-09-13 NOTE — Progress Notes (Signed)
BP 128/78 (BP Location: Left Arm)   Pulse 71   Temp 98.3 F (36.8 C) (Oral)   Wt 130 lb 3.2 oz (59.1 kg)   SpO2 99%   BMI 23.06 kg/m    Subjective:    Patient ID: Marissa Hess, female    DOB: December 24, 1934, 87 y.o.   MRN: [id number]  HPI: Marissa Hess is a 86 y.o. female  Chief Complaint  Patient presents with   Atrial Fibrillation    1 week f/up   ATRIAL FIBRILLATION Diagnosed by PCP on 09/06/20.  Started on Eliquis 2.5 MG BID and continued Carvedilol.   Seen by cardiology 09/07/20 and noted to have atrial flutter on EKG with them -- plan is to continue anticoagulation and possible cardioversion in 3 weeks.  They also plan to obtain echo. She reports today feeling much better then last visit and no CP.  She is planning on having cardioversion, discussed with her daughter and her daughter (RN to [employer]) would like patient to have second opinion from Dr. Nehemiah Massed.  She requests that referral today. Atrial fibrillation status: uncontrolled Satisfied with current treatment: yes  Medication side effects:  no Medication compliance: good compliance Etiology of atrial fibrillation:  Palpitations:  no -- improving Chest pain:  no Dyspnea on exertion:  no Orthopnea:  no Syncope:  no Edema:  no Ventricular rate control: B-blocker Anti-coagulation: long acting   Relevant past medical, surgical, family and social history reviewed and updated as indicated. Interim medical history since our last visit reviewed. Allergies and medications reviewed and updated.  Review of Systems  Constitutional:  Negative for activity change, appetite change, diaphoresis, fatigue and fever.  Respiratory:  Negative for cough, chest tightness and shortness of breath.   Cardiovascular:  Negative for chest pain, palpitations and leg swelling.  Gastrointestinal: Negative.   Neurological: Negative.   Psychiatric/Behavioral: Negative.     Per HPI unless specifically indicated above     Objective:     BP 128/78 (BP Location: Left Arm)   Pulse 71   Temp 98.3 F (36.8 C) (Oral)   Wt 130 lb 3.2 oz (59.1 kg)   SpO2 99%   BMI 23.06 kg/m   Wt Readings from Last 3 Encounters:  09/13/20 130 lb 3.2 oz (59.1 kg)  09/07/20 131 lb 8 oz (59.6 kg)  09/06/20 130 lb 6.4 oz (59.1 kg)    Physical Exam Vitals and nursing note reviewed.  Constitutional:      General: She is awake. She is not in acute distress.    Appearance: She is well-developed and underweight. She is not ill-appearing or toxic-appearing.     Comments: Frail appearing.  HENT:     Head: Normocephalic and atraumatic.     Right Ear: Hearing normal.     Left Ear: Hearing normal.  Eyes:     General: Lids are normal.        Right eye: No discharge.        Left eye: No discharge.     Conjunctiva/sclera: Conjunctivae normal.     Pupils: Pupils are equal, round, and reactive to light.  Neck:     Vascular: No carotid bruit.  Cardiovascular:     Rate and Rhythm: Normal rate. Rhythm irregular.     Heart sounds: Normal heart sounds. No murmur heard.   No gallop.     Arteriovenous access: Right arteriovenous access is present. Pulmonary:     Effort: Pulmonary effort is normal. No accessory muscle usage  or respiratory distress.     Breath sounds: Normal breath sounds.  Abdominal:     General: Bowel sounds are normal.     Palpations: Abdomen is soft.  Musculoskeletal:     Cervical back: Normal range of motion and neck supple.     Right lower leg: No edema.     Left lower leg: No edema.  Skin:    General: Skin is warm and dry.  Neurological:     Mental Status: She is alert and oriented to person, place, and time.     Cranial Nerves: Cranial nerves are intact.     Motor: Motor function is intact.     Coordination: Coordination is intact.     Gait: Gait is intact.     Deep Tendon Reflexes: Reflexes are normal and symmetric.     Reflex Scores:      Brachioradialis reflexes are 2+ on the right side and 2+ on the left side.       Patellar reflexes are 2+ on the right side and 2+ on the left side. Psychiatric:        Attention and Perception: Attention normal.        Mood and Affect: Mood normal.        Speech: Speech normal.        Behavior: Behavior normal. Behavior is cooperative.        Thought Content: Thought content normal.        Judgment: Judgment normal.    Results for orders placed or performed in visit on 09/06/20  CBC with Differential/Platelet  Result Value Ref Range   WBC 5.9 3.4 - 10.8 x10E3/uL   RBC 3.88 3.77 - 5.28 x10E6/uL   Hemoglobin 10.9 (L) 11.1 - 15.9 g/dL   Hematocrit 33.5 (L) 34.0 - 46.6 %   MCV 86 79 - 97 fL   MCH 28.1 26.6 - 33.0 pg   MCHC 32.5 31.5 - 35.7 g/dL   RDW 17.9 (H) 11.7 - 15.4 %   Platelets 201 150 - 450 x10E3/uL   Neutrophils 69 Not Estab. %   Lymphs 19 Not Estab. %   Monocytes 9 Not Estab. %   Eos 2 Not Estab. %   Basos 1 Not Estab. %   Neutrophils Absolute 4.1 1.4 - 7.0 x10E3/uL   Lymphocytes Absolute 1.1 0.7 - 3.1 x10E3/uL   Monocytes Absolute 0.6 0.1 - 0.9 x10E3/uL   EOS (ABSOLUTE) 0.1 0.0 - 0.4 x10E3/uL   Basophils Absolute 0.0 0.0 - 0.2 x10E3/uL   Immature Granulocytes 0 Not Estab. %   Immature Grans (Abs) 0.0 0.0 - 0.1 x10E3/uL  Comprehensive metabolic panel  Result Value Ref Range   Glucose 79 65 - 99 mg/dL   BUN 12 8 - 27 mg/dL   Creatinine, Ser 2.39 (H) 0.57 - 1.00 mg/dL   eGFR 20 (L) >59 mL/min/1.73   BUN/Creatinine Ratio 5 (L) 12 - 28   Sodium 140 134 - 144 mmol/L   Potassium 3.7 3.5 - 5.2 mmol/L   Chloride 97 96 - 106 mmol/L   CO2 27 20 - 29 mmol/L   Calcium 9.1 8.7 - 10.3 mg/dL   Total Protein 7.0 6.0 - 8.5 g/dL   Albumin 3.4 (L) 3.6 - 4.6 g/dL   Globulin, Total 3.6 1.5 - 4.5 g/dL   Albumin/Globulin Ratio 0.9 (L) 1.2 - 2.2   Bilirubin Total 1.2 0.0 - 1.2 mg/dL   Alkaline Phosphatase 53 44 - 121 IU/L   AST 11 0 -  40 IU/L   ALT 5 0 - 32 IU/L  TSH  Result Value Ref Range   TSH 3.610 0.450 - 4.500 uIU/mL      Assessment & Plan:    Problem List Items Addressed This Visit       Cardiovascular and Mediastinum   Atrial flutter ([place]) - Primary    New diagnosis as of 09/06/20.  At this time continue Eliquis as ordered and Carvedilol, symptoms overall improved today.  Educated her on diagnosis and when to seek immediate care.  Her daughter is requesting a second opinion with Dr. Nehemiah Massed, referral placed per request.  Return in 8 weeks for follow-up.       Relevant Orders   Ambulatory referral to Cardiology     Follow up plan: Return in about 8 weeks (around 11/08/2020) for Atrial Flutter and ESRD.

## 2020-09-13 NOTE — Assessment & Plan Note (Signed)
New diagnosis as of 09/06/20.  At this time continue Eliquis as ordered and Carvedilol, symptoms overall improved today.  Educated her on diagnosis and when to seek immediate care.  Her daughter is requesting a second opinion with Dr. Nehemiah Massed, referral placed per request.  Return in 8 weeks for follow-up.

## 2020-09-13 NOTE — Patient Instructions (Signed)
Atrial Flutter  Atrial flutter is a type of abnormal heart rhythm (arrhythmia). The heart has an electrical system that tells it how to beat. In atrial flutter, the signals move rapidly in the top chambers of the heart (the atria). This makes your heart beat very fast. Atrial flutter can come and go, or itcan be permanent. The goal of treatment is to prevent blood clots from forming, control your heart rate, or restore your heartbeat to a normal rhythm. If this condition is not treated, it can cause serious problems, such as a weakened heart muscle (cardiomyopathy) or a stroke. What are the causes? This condition is often caused by conditions that damage the heart's electrical system. These include: Heart conditions and heart surgery. These include heart attacks and open-heart surgery. Lung problems, such as COPD or a blood clot in the lung (pulmonary embolism, or PE). Poorly controlled high blood pressure (hypertension). Overactive thyroid (hyperthyroidism). Diabetes. In some cases, the cause of this condition is not known. What increases the risk? You are more likely to develop this condition if: You are an elderly adult. You are a man. You are overweight (obese). You have obstructive sleep apnea. You have a family history of atrial flutter. You have diabetes. You drink a lot of alcohol, especially binge drinking. You use drugs, including cannabis. You smoke. What are the signs or symptoms? Symptoms of this condition include: A feeling that your heart is pounding or racing (palpitations). Shortness of breath. Chest pain. Feeling dizzy or light-headed. Fainting. Low blood pressure (hypotension). Fatigue. Tiring easily during exercise or activity. In some cases, there are no symptoms. How is this diagnosed? This condition may be diagnosed with: An electrocardiogram (ECG) to check electrical signals of the heart. An ambulatory cardiac monitor to record your heart's activity for a  few days. An echocardiogram to create pictures of your heart. A transesophageal echocardiogram (TEE) to create even better pictures of your heart. A stress test to check your blood supply while you exercise. Imaging tests, such as a CT scan or chest X-ray. Blood tests. How is this treated? Treatment depends on underlying conditions and how you feel when you experience atrial flutter. This condition may be treated with: Medicines to prevent blood clots or to treat heart rate or heart rhythm problems. Electrical cardioversion to reset the heart's rhythm. Ablation to remove the heart tissue that sends abnormal signals. Left atrial appendage closure to seal the area where blood clots can form. In some cases, underlying conditions will be treated. Follow these instructions at home: Medicines Take over-the-counter and prescription medicines only as told by your health care provider. Do not take any new medicines without talking to your health care provider. If you are taking blood thinners: Talk with your health care provider before you take any medicines that contain aspirin or NSAIDs, such as ibuprofen. These medicines increase your risk for dangerous bleeding. Take your medicine exactly as told, at the same time every day. Avoid activities that could cause injury or bruising, and follow instructions about how to prevent falls. Wear a medical alert bracelet or carry a card that lists what medicines you take. Lifestyle Eat heart-healthy foods. Talk with a dietitian to make an eating plan that is right for you. Do not use any products that contain nicotine or tobacco, such as cigarettes, e-cigarettes, and chewing tobacco. If you need help quitting, ask your health care provider. Do not drink alcohol. Do not use drugs, including cannabis. Lose weight if you are overweight or  obese. Exercise regularly as instructed by your health care provider. General instructions Do not use diet pills unless  your health care provider approves. Diet pills may make heart problems worse. If you have obstructive sleep apnea, manage your condition as told by your health care provider. Keep all follow-up visits as told by your health care provider. This is important. Contact a health care provider if you: Notice a change in the rate, rhythm, or strength of your heartbeat. Are taking a blood thinner and you notice more bruising. Have a sudden change in weight. Tire more easily when you exercise or do heavy work. Get help right away if you have: Pain or pressure in your chest. Shortness of breath. Fainting. Increasing sweating with no known cause. Side effects of blood thinners, such as blood in your vomit, stool, or urine, or bleeding that cannot stop. Any symptoms of a stroke. "BE FAST" is an easy way to remember the main warning signs of a stroke: B - Balance. Signs are dizziness, sudden trouble walking, or loss of balance. E - Eyes. Signs are trouble seeing or a sudden change in vision. F - Face. Signs are sudden weakness or numbness of the face, or the face or eyelid drooping on one side. A - Arms. Signs are weakness or numbness in an arm. This happens suddenly and usually on one side of the body. S - Speech. Signs are sudden trouble speaking, slurred speech, or trouble understanding what people say. T - Time. Time to call emergency services. Write down what time symptoms started. Other signs of a stroke, such as: A sudden, severe headache with no known cause. Nausea or vomiting. Seizure. These symptoms may represent a serious problem that is an emergency. Do not wait to see if the symptoms will go away. Get medical help right away. Call your local emergency services (911 in the [place] Do not drive yourself to the hospital. Summary Atrial flutter is an abnormal heart rhythm that can give you symptoms of palpitations, shortness of breath, or fatigue. Atrial flutter is often treated with medicines  to keep your heart in a normal rhythm and to prevent a stroke. Get help right away if you cannot catch your breath, or have chest pain or pressure. Get help right away if you have signs or symptoms of a stroke. This information is not intended to replace advice given to you by your health care provider. Make sure you discuss any questions you have with your healthcare provider. Document Revised: 08/26/2018 Document Reviewed: 08/26/2018 Elsevier Patient Education  [place].

## 2020-09-14 DIAGNOSIS — N186 End stage renal disease: Secondary | ICD-10-CM | POA: Diagnosis not present

## 2020-09-14 DIAGNOSIS — Z992 Dependence on renal dialysis: Secondary | ICD-10-CM | POA: Diagnosis not present

## 2020-09-18 DIAGNOSIS — Z992 Dependence on renal dialysis: Secondary | ICD-10-CM | POA: Diagnosis not present

## 2020-09-18 DIAGNOSIS — T82898A Other specified complication of vascular prosthetic devices, implants and grafts, initial encounter: Secondary | ICD-10-CM | POA: Diagnosis not present

## 2020-09-18 DIAGNOSIS — N186 End stage renal disease: Secondary | ICD-10-CM | POA: Diagnosis not present

## 2020-09-19 DIAGNOSIS — Z992 Dependence on renal dialysis: Secondary | ICD-10-CM | POA: Diagnosis not present

## 2020-09-19 DIAGNOSIS — T82898A Other specified complication of vascular prosthetic devices, implants and grafts, initial encounter: Secondary | ICD-10-CM | POA: Diagnosis not present

## 2020-09-19 DIAGNOSIS — N186 End stage renal disease: Secondary | ICD-10-CM | POA: Diagnosis not present

## 2020-09-20 ENCOUNTER — Telehealth: Payer: Self-pay | Admitting: Pharmacist

## 2020-09-20 NOTE — Telephone Encounter (Signed)
Called patient to follow up on Eliquis PAP paperwork. Left message with spouse as patient was not at home.

## 2020-09-22 DIAGNOSIS — Z992 Dependence on renal dialysis: Secondary | ICD-10-CM | POA: Diagnosis not present

## 2020-09-22 DIAGNOSIS — T82898A Other specified complication of vascular prosthetic devices, implants and grafts, initial encounter: Secondary | ICD-10-CM | POA: Diagnosis not present

## 2020-09-22 DIAGNOSIS — N186 End stage renal disease: Secondary | ICD-10-CM | POA: Diagnosis not present

## 2020-09-25 ENCOUNTER — Other Ambulatory Visit
Admission: RE | Admit: 2020-09-25 | Discharge: 2020-09-25 | Disposition: A | Discharge status: Home / Self Care | Payer: Medicare Other | Source: Ambulatory Visit | Attending: Cardiology | Admitting: Cardiology

## 2020-09-25 DIAGNOSIS — Z992 Dependence on renal dialysis: Secondary | ICD-10-CM | POA: Diagnosis not present

## 2020-09-25 DIAGNOSIS — N186 End stage renal disease: Secondary | ICD-10-CM | POA: Diagnosis not present

## 2020-09-25 DIAGNOSIS — I4892 Unspecified atrial flutter: Secondary | ICD-10-CM | POA: Insufficient documentation

## 2020-09-25 DIAGNOSIS — T82898A Other specified complication of vascular prosthetic devices, implants and grafts, initial encounter: Secondary | ICD-10-CM | POA: Diagnosis not present

## 2020-09-25 DIAGNOSIS — N2581 Secondary hyperparathyroidism of renal origin: Secondary | ICD-10-CM | POA: Diagnosis not present

## 2020-09-25 DIAGNOSIS — D509 Iron deficiency anemia, unspecified: Secondary | ICD-10-CM | POA: Diagnosis not present

## 2020-09-25 LAB — CBC
HCT: 33.9 % — ABNORMAL LOW (ref 36.0–46.0)
Hemoglobin: 11 g/dL — ABNORMAL LOW (ref 12.0–15.0)
MCH: 28.7 pg (ref 26.0–34.0)
MCHC: 32.4 g/dL (ref 30.0–36.0)
MCV: 88.5 fL (ref 80.0–100.0)
Platelets: 198 10*3/uL (ref 150–400)
RBC: 3.83 MIL/uL — ABNORMAL LOW (ref 3.87–5.11)
RDW: 19.9 % — ABNORMAL HIGH (ref 11.5–15.5)
WBC: 4.9 10*3/uL (ref 4.0–10.5)
nRBC: 0 % (ref 0.0–0.2)

## 2020-09-25 LAB — BASIC METABOLIC PANEL
Anion gap: 8 (ref 5–15)
BUN: 6 mg/dL — ABNORMAL LOW (ref 8–23)
CO2: 31 mmol/L (ref 22–32)
Calcium: 8 mg/dL — ABNORMAL LOW (ref 8.9–10.3)
Chloride: 96 mmol/L — ABNORMAL LOW (ref 98–111)
Creatinine, Ser: 1.43 mg/dL — ABNORMAL HIGH (ref 0.44–1.00)
GFR, Estimated: 36 mL/min — ABNORMAL LOW (ref >60)
Glucose, Bld: 98 mg/dL (ref 70–99)
Potassium: 3.2 mmol/L — ABNORMAL LOW (ref 3.5–5.1)
Sodium: 135 mmol/L (ref 135–145)

## 2020-09-26 ENCOUNTER — Other Ambulatory Visit: Payer: Self-pay

## 2020-09-26 ENCOUNTER — Ambulatory Visit (INDEPENDENT_AMBULATORY_CARE_PROVIDER_SITE_OTHER): Payer: Medicare Other

## 2020-09-26 DIAGNOSIS — I4892 Unspecified atrial flutter: Secondary | ICD-10-CM | POA: Diagnosis not present

## 2020-09-26 LAB — ECHOCARDIOGRAM COMPLETE
AR max vel: 2.52 cm2
AV Area VTI: 2.28 cm2
AV Area mean vel: 2.36 cm2
AV Mean grad: 2 mmHg
AV Peak grad: 4.4 mmHg
Ao pk vel: 1.05 m/s
Area-P 1/2: 4.01 cm2
Calc EF: 52.1 %
S' Lateral: 3 cm
Single Plane A2C EF: 51.1 %
Single Plane A4C EF: 53.4 %

## 2020-09-27 ENCOUNTER — Telehealth: Payer: Self-pay

## 2020-09-27 NOTE — Telephone Encounter (Signed)
Copied from [place] [id number]. Topic: General - Other >> Sep 27, 2020  2:06 PM Pawlus, Brayton Layman A wrote: Reason for CRM: Pt had some questions before her surgery on 7/20 and requested a call back.

## 2020-09-28 NOTE — Telephone Encounter (Signed)
Patient states she found the paper with the information she was needing. No further questions.

## 2020-09-29 DIAGNOSIS — T82898A Other specified complication of vascular prosthetic devices, implants and grafts, initial encounter: Secondary | ICD-10-CM | POA: Diagnosis not present

## 2020-09-29 DIAGNOSIS — Z992 Dependence on renal dialysis: Secondary | ICD-10-CM | POA: Diagnosis not present

## 2020-09-29 DIAGNOSIS — N186 End stage renal disease: Secondary | ICD-10-CM | POA: Diagnosis not present

## 2020-10-02 DIAGNOSIS — Z992 Dependence on renal dialysis: Secondary | ICD-10-CM | POA: Diagnosis not present

## 2020-10-02 DIAGNOSIS — T82898A Other specified complication of vascular prosthetic devices, implants and grafts, initial encounter: Secondary | ICD-10-CM | POA: Diagnosis not present

## 2020-10-02 DIAGNOSIS — N186 End stage renal disease: Secondary | ICD-10-CM | POA: Diagnosis not present

## 2020-10-02 DIAGNOSIS — N2581 Secondary hyperparathyroidism of renal origin: Secondary | ICD-10-CM | POA: Diagnosis not present

## 2020-10-02 DIAGNOSIS — D509 Iron deficiency anemia, unspecified: Secondary | ICD-10-CM | POA: Diagnosis not present

## 2020-10-04 ENCOUNTER — Encounter: Payer: Self-pay | Admitting: Certified Registered Nurse Anesthetist

## 2020-10-04 ENCOUNTER — Encounter
Admission: RE | Disposition: A | Discharge status: Home / Self Care | Payer: Self-pay | Source: Home / Self Care | Attending: Cardiology

## 2020-10-04 ENCOUNTER — Encounter: Payer: Self-pay | Admitting: Cardiology

## 2020-10-04 ENCOUNTER — Ambulatory Visit
Admission: RE | Admit: 2020-10-04 | Discharge: 2020-10-04 | Disposition: A | Discharge status: Home / Self Care | Payer: Medicare Other | Attending: Cardiology | Admitting: Cardiology

## 2020-10-04 DIAGNOSIS — Z992 Dependence on renal dialysis: Secondary | ICD-10-CM | POA: Insufficient documentation

## 2020-10-04 DIAGNOSIS — Z79899 Other long term (current) drug therapy: Secondary | ICD-10-CM | POA: Insufficient documentation

## 2020-10-04 DIAGNOSIS — M3214 Glomerular disease in systemic lupus erythematosus: Secondary | ICD-10-CM | POA: Diagnosis not present

## 2020-10-04 DIAGNOSIS — Z880 Allergy status to penicillin: Secondary | ICD-10-CM | POA: Diagnosis not present

## 2020-10-04 DIAGNOSIS — Z886 Allergy status to analgesic agent status: Secondary | ICD-10-CM | POA: Diagnosis not present

## 2020-10-04 DIAGNOSIS — I120 Hypertensive chronic kidney disease with stage 5 chronic kidney disease or end stage renal disease: Secondary | ICD-10-CM | POA: Diagnosis not present

## 2020-10-04 DIAGNOSIS — Z885 Allergy status to narcotic agent status: Secondary | ICD-10-CM | POA: Insufficient documentation

## 2020-10-04 DIAGNOSIS — Z7901 Long term (current) use of anticoagulants: Secondary | ICD-10-CM | POA: Insufficient documentation

## 2020-10-04 DIAGNOSIS — N186 End stage renal disease: Secondary | ICD-10-CM | POA: Insufficient documentation

## 2020-10-04 DIAGNOSIS — Z87891 Personal history of nicotine dependence: Secondary | ICD-10-CM | POA: Diagnosis not present

## 2020-10-04 DIAGNOSIS — Z882 Allergy status to sulfonamides status: Secondary | ICD-10-CM | POA: Insufficient documentation

## 2020-10-04 DIAGNOSIS — I4892 Unspecified atrial flutter: Secondary | ICD-10-CM | POA: Insufficient documentation

## 2020-10-04 DIAGNOSIS — I4891 Unspecified atrial fibrillation: Secondary | ICD-10-CM | POA: Diagnosis not present

## 2020-10-04 HISTORY — PX: CARDIOVERSION: SHX1299

## 2020-10-04 SURGERY — CARDIOVERSION
Anesthesia: General

## 2020-10-04 MED ORDER — PHENYLEPHRINE HCL (PRESSORS) 10 MG/ML IV SOLN
INTRAVENOUS | Status: AC
  Filled 2020-10-04: qty 1

## 2020-10-04 MED ORDER — EPHEDRINE 5 MG/ML INJ
INTRAVENOUS | Status: AC
  Filled 2020-10-04: qty 10

## 2020-10-04 MED ORDER — PROPOFOL 500 MG/50ML IV EMUL
INTRAVENOUS | Status: AC
  Filled 2020-10-04: qty 50

## 2020-10-04 NOTE — Interval H&P Note (Signed)
History and Physical Interval Note:  10/04/2020 5:01 PM  Marissa Hess  has presented today for surgery, with the diagnosis of AFlutter.  The various methods of treatment have been discussed with the patient and family. After consideration of risks, benefits and other options for treatment, the patient has consented to  Procedure(s): CARDIOVERSION (N/A) as a surgical intervention.  The patient's history has been reviewed, patient examined, no change in status, stable for surgery.  I have reviewed the patient's chart and labs.  Questions were answered to the patient's satisfaction.    EKG obtained prior to procedure showed normal sinus rhythm.  As such procedure/DC cardioversion was not performed.  Patient advised to continue medications as prescribed, keep follow-up appointment with myself.    Aaron Edelman [place]

## 2020-10-06 DIAGNOSIS — N186 End stage renal disease: Secondary | ICD-10-CM | POA: Diagnosis not present

## 2020-10-06 DIAGNOSIS — Z992 Dependence on renal dialysis: Secondary | ICD-10-CM | POA: Diagnosis not present

## 2020-10-06 DIAGNOSIS — T82898A Other specified complication of vascular prosthetic devices, implants and grafts, initial encounter: Secondary | ICD-10-CM | POA: Diagnosis not present

## 2020-10-09 DIAGNOSIS — T82898A Other specified complication of vascular prosthetic devices, implants and grafts, initial encounter: Secondary | ICD-10-CM | POA: Diagnosis not present

## 2020-10-09 DIAGNOSIS — Z992 Dependence on renal dialysis: Secondary | ICD-10-CM | POA: Diagnosis not present

## 2020-10-09 DIAGNOSIS — N186 End stage renal disease: Secondary | ICD-10-CM | POA: Diagnosis not present

## 2020-10-13 DIAGNOSIS — N186 End stage renal disease: Secondary | ICD-10-CM | POA: Diagnosis not present

## 2020-10-13 DIAGNOSIS — Z992 Dependence on renal dialysis: Secondary | ICD-10-CM | POA: Diagnosis not present

## 2020-10-13 DIAGNOSIS — T82898A Other specified complication of vascular prosthetic devices, implants and grafts, initial encounter: Secondary | ICD-10-CM | POA: Diagnosis not present

## 2020-10-15 DIAGNOSIS — N186 End stage renal disease: Secondary | ICD-10-CM | POA: Diagnosis not present

## 2020-10-15 DIAGNOSIS — Z992 Dependence on renal dialysis: Secondary | ICD-10-CM | POA: Diagnosis not present

## 2020-10-17 ENCOUNTER — Telehealth: Payer: Self-pay

## 2020-10-17 ENCOUNTER — Telehealth: Payer: Self-pay | Admitting: General Practice

## 2020-10-17 DIAGNOSIS — Z992 Dependence on renal dialysis: Secondary | ICD-10-CM | POA: Diagnosis not present

## 2020-10-17 DIAGNOSIS — N186 End stage renal disease: Secondary | ICD-10-CM | POA: Diagnosis not present

## 2020-10-17 NOTE — Telephone Encounter (Signed)
  Care Management   Follow Up Note   10/17/2020 Name: Marissa Hess MRN: [id number] DOB: Oct 21, 1934   Referred by: Venita Lick, NP Reason for referral : Chronic Care Management (RNCM: Follow up for Chronic Disease Management and Care Coordination Needs )   An unsuccessful telephone outreach was attempted today. The patient was referred to the case management team for assistance with care management and care coordination.   Follow Up Plan: A HIPPA compliant phone message was left for the patient providing contact information and requesting a return call.   Noreene Larsson RN, MSN, [facility] Mobile: [phone]

## 2020-10-19 ENCOUNTER — Other Ambulatory Visit: Payer: Self-pay

## 2020-10-19 ENCOUNTER — Ambulatory Visit: Payer: Medicare Other | Admitting: Cardiology

## 2020-10-19 ENCOUNTER — Encounter: Payer: Self-pay | Admitting: Cardiology

## 2020-10-19 VITALS — BP 132/70 | HR 73 | Ht 63.0 in | Wt 131.5 lb

## 2020-10-19 DIAGNOSIS — I10 Essential (primary) hypertension: Secondary | ICD-10-CM | POA: Diagnosis not present

## 2020-10-19 DIAGNOSIS — I272 Pulmonary hypertension, unspecified: Secondary | ICD-10-CM | POA: Diagnosis not present

## 2020-10-19 DIAGNOSIS — I4892 Unspecified atrial flutter: Secondary | ICD-10-CM

## 2020-10-19 NOTE — Patient Instructions (Signed)
Medication Instructions:  Your physician recommends that you continue on your current medications as directed. Please refer to the Current Medication list given to you today.  *If you need a refill on your cardiac medications before your next appointment, please call your pharmacy*   Lab Work: None ordered If you have labs (blood work) drawn today and your tests are completely normal, you will receive your results only by: [place] (if you have MyChart) OR A paper copy in the mail If you have any lab test that is abnormal or we need to change your treatment, we will call you to review the results.   Testing/Procedures:  Your physician has requested that you have an echocardiogram in 3 months. Echocardiography is a painless test that uses sound waves to create images of your heart. It provides your doctor with information about the size and shape of your heart and how well your heart's chambers and valves are working. This procedure takes approximately one hour. There are no restrictions for this procedure.    Follow-Up: At [facility], you and your health needs are our priority.  As part of our continuing mission to provide you with exceptional heart care, we have created designated Provider Care Teams.  These Care Teams include your primary Cardiologist (physician) and Advanced Practice Providers (APPs -  Physician Assistants and Nurse Practitioners) who all work together to provide you with the care you need, when you need it.  We recommend signing up for the patient portal called "MyChart".  Sign up information is provided on this After Visit Summary.  MyChart is used to connect with patients for Virtual Visits (Telemedicine).  Patients are able to view lab/test results, encounter notes, upcoming appointments, etc.  Non-urgent messages can be sent to your provider as well.   To learn more about what you can do with MyChart, go to [email or web address].    Your next  appointment:   3 month(s)  The format for your next appointment:   In Person  Provider:   Kate Sable, MD   Other Instructions

## 2020-10-19 NOTE — Progress Notes (Signed)
Cardiology Office Note:    Date:  10/19/2020   ID:  Marissa Hess, DOB 06/19/1934, MRN [id number]  PCP:  Marissa Lick, NP   [facility] Providers Cardiologist:  None     Referring MD: Marissa Lick, NP   Chief Complaint  Patient presents with   Follow-up    "Doing well." Medications reviewed by the patient verbally.     History of Present Illness:    Marissa Hess is a 86 y.o. female with a hx of hypertension, paroxysmal atrial fib/flutter, ESRD on HD M,F, rheumatoid arthritis who presents for follow-up.  Previously seen due to atrial flutter.  Placed on Eliquis, DC cardioversion was planned.  Presented for DC cardioversion with self conversion and EKG showing sinus rhythm.  DC cardioversion was not performed as such.  Presents today states doing well, has no other concerns at this time.  Tolerating Coreg and Eliquis as prescribed.  Was diagnosed with rheumatoid arthritis years ago, was also told she has lupus but she denies lupus diagnosis.  Prior notes  She states having an old heart attack back in 2007 at [facility].  No stents were placed.  Denies any other history of heart disease.  Past Medical History:  Diagnosis Date   Allergy    Arthritis    Chronic kidney disease    CKD V   GERD (gastroesophageal reflux disease)    Hypertension    Lupus ([place]) 2002   Macular degeneration of left eye    Myocardial infarction [facility]) 2007   Osteoporosis     Past Surgical History:  Procedure Laterality Date   A/V FISTULAGRAM Right 01/16/2018   Procedure: A/V FISTULAGRAM;  Surgeon: [name], MD;  Location: [place] [facility];  Service: Cardiovascular;  Laterality: Right;   ABDOMINAL HYSTERECTOMY     AV FISTULA PLACEMENT Right 12/17/2017   Procedure: ARTERIOVENOUS (AV) FISTULA CREATION ( BRACHIO CEPHALIC POSS. BRACHIO BASILIC );  Surgeon: Algernon Huxley, MD;  Location: [facility] OR[facility];  Service: Vascular;  Laterality: Right;   CARDIOVERSION N/A 10/04/2020   Procedure:  CARDIOVERSION;  Surgeon: Kate Sable, MD;  Location: [facility];  Service: Cardiovascular;  Laterality: N/A;   CATARACT EXTRACTION W/PHACO Right 01/03/2016   Procedure: CATARACT EXTRACTION PHACO AND INTRAOCULAR LENS PLACEMENT ([place]);  Surgeon: Leandrew Koyanagi, MD;  Location: [place];  Service: Ophthalmology;  Laterality: Right;   COLON SURGERY     intestine became twisted after hernia sugery   EYE SURGERY     cataract   HERNIA REPAIR     TEMPORAL ARTERY BIOPSY / LIGATION     TONSILLECTOMY      Current Medications: Current Meds  Medication Sig   apixaban (ELIQUIS) 2.5 MG TABS tablet Take 1 tablet (2.5 mg total) by mouth 2 (two) times daily.   carvedilol (COREG) 25 MG tablet Take 1 tablet (25 mg total) by mouth 2 (two) times daily with a meal.   cholecalciferol (VITAMIN D) 1000 units tablet Take 1,000 Units by mouth daily.    ferrous sulfate (SLOW FE) 160 (50 Fe) MG TBCR SR tablet Take 160 mg by mouth daily.   folic acid (FOLVITE) 1 MG tablet Take 1 tablet (1 mg total) by mouth daily.   mirtazapine (REMERON) 7.5 MG tablet Take 7.5 mg by mouth at bedtime.   rOPINIRole (REQUIP) 0.5 MG tablet Take one tablet (0.5 MG) by mouth in the morning and then take two tablets (1 MG) by mouth at night before bedtime. (Patient taking  differently: Take 0.5-1 mg by mouth See admin instructions. Take one tablet (0.5 MG) by mouth in the morning and then take two tablets (1 MG) by mouth at night before bedtime.)     Allergies:   Codeine, Ibuprofen, Sulfa antibiotics, Vioxx [rofecoxib], Celebrex [celecoxib], and Penicillins   Social History   Socioeconomic History   Marital status: Married    Spouse name: Not on file   Number of children: Not on file   Years of education: Not on file   Highest education level: High school graduate  Occupational History    Comment: Recruitment consultant; Orthoptist; child care  Tobacco Use   Smoking status: Former    Types: Cigarettes    Start date: 09/11/1984     Quit date: 09/26/1994    Years since quitting: 26.0   Smokeless tobacco: Never  Vaping Use   Vaping Use: Never used  Substance and Sexual Activity   Alcohol use: No    Alcohol/week: 0.0 standard drinks   Drug use: No   Sexual activity: Not on file  Other Topics Concern   Not on file  Social History Narrative   Not on file   Social Determinants of Health   Financial Resource Strain: Low Risk    Difficulty of Paying Living Expenses: Not hard at all  Food Insecurity: No Food Insecurity   Worried About Charity fundraiser in the Last Year: Never true   [place] in the Last Year: Never true  Transportation Needs: No Transportation Needs   Lack of Transportation (Medical): No   Lack of Transportation (Non-Medical): No  Physical Activity: Inactive   Days of Exercise per Week: 0 days   Minutes of Exercise per Session: 0 min  Stress: No Stress Concern Present   Feeling of Stress : Not at all  Social Connections: Not on file     Family History: The patient's family history includes Rheum arthritis in her father; Stomach cancer in her mother; Thyroid disease in her daughter; Varicose Veins in her maternal aunt. There is no history of Breast cancer.  ROS:   Please see the history of present illness.     All other systems reviewed and are negative.  EKGs/Labs/Other Studies Reviewed:    The following studies were reviewed today:   EKG:  EKG is  ordered today.  The ekg ordered today demonstrates sinus rhythm, first-degree AV block.  Recent Labs: 09/06/2020: ALT 5; TSH 3.610 09/25/2020: BUN 6; Creatinine, Ser 1.43; Hemoglobin 11.0; Platelets 198; Potassium 3.2; Sodium 135  Recent Lipid Panel    Component Value Date/Time   CHOL 187 11/26/2018 1346   TRIG 110 11/26/2018 1346   HDL 51 11/26/2018 1346   CHOLHDL 6.5 (H) 10/08/2017 1304   LDLCALC 116 (H) 11/26/2018 1346     Risk Assessment/Calculations:          Physical Exam:    VS:  BP 132/70 (BP Location: Left  Arm, Patient Position: Sitting, Cuff Size: Normal)   Pulse 73   Ht '5\' 3"'$  (1.6 m)   Wt 131 lb 8 oz (59.6 kg)   SpO2 91%   BMI 23.29 kg/m     Wt Readings from Last 3 Encounters:  10/19/20 131 lb 8 oz (59.6 kg)  10/04/20 130 lb (59 kg)  09/13/20 130 lb 3.2 oz (59.1 kg)     GEN:  Well nourished, well developed in no acute distress HEENT: Normal NECK: No JVD; No carotid bruits LYMPHATICS: No lymphadenopathy CARDIAC:  Irregularly irregular, no murmurs RESPIRATORY:  Clear to auscultation without rales, wheezing or rhonchi  ABDOMEN: Soft, non-tender, non-distended MUSCULOSKELETAL:  No edema; left arm AV fistula noted. SKIN: Warm and dry NEUROLOGIC:  Alert and oriented x 3 PSYCHIATRIC:  Normal affect   ASSESSMENT:    1. Atrial flutter, unspecified type ([place])   2. Primary hypertension   3. Pulmonary hypertension, unspecified ([place])     PLAN:    In order of problems listed above:  Paroxysmal atrial flutter, CHA2DS2-VASc of 4 (age, htn, gender), currently in sinus rhythm.  Echo showed normal systolic function, EF [id number], moderately dilated LA, severe pulmonary hypertension. Continue Coreg, Eliquis.   Hypertension, BP controlled, continue Coreg. Pulmonary hypertension, RVSP 82.2 mmHg.repeat echo while in sinus rhythm.  Volume control with hemodialysis.  Based on repeat echo, consider referral to pulmonary hypertension clinic.  Etiology includes rheumatoid arthritis associated PAH, diastolic dysfunction.  Follow-up in 3 months after repeat echo.   Medication Adjustments/Labs and Tests Ordered: Current medicines are reviewed at length with the patient today.  Concerns regarding medicines are outlined above.  Orders Placed This Encounter  Procedures   EKG 12-Lead   ECHOCARDIOGRAM COMPLETE     No orders of the defined types were placed in this encounter.   Patient Instructions  Medication Instructions:  Your physician recommends that you continue on your current medications as  directed. Please refer to the Current Medication list given to you today.  *If you need a refill on your cardiac medications before your next appointment, please call your pharmacy*   Lab Work: None ordered If you have labs (blood work) drawn today and your tests are completely normal, you will receive your results only by: [place] (if you have MyChart) OR A paper copy in the mail If you have any lab test that is abnormal or we need to change your treatment, we will call you to review the results.   Testing/Procedures:  Your physician has requested that you have an echocardiogram in 3 months. Echocardiography is a painless test that uses sound waves to create images of your heart. It provides your doctor with information about the size and shape of your heart and how well your heart's chambers and valves are working. This procedure takes approximately one hour. There are no restrictions for this procedure.    Follow-Up: At [facility], you and your health needs are our priority.  As part of our continuing mission to provide you with exceptional heart care, we have created designated Provider Care Teams.  These Care Teams include your primary Cardiologist (physician) and Advanced Practice Providers (APPs -  Physician Assistants and Nurse Practitioners) who all work together to provide you with the care you need, when you need it.  We recommend signing up for the patient portal called "MyChart".  Sign up information is provided on this After Visit Summary.  MyChart is used to connect with patients for Virtual Visits (Telemedicine).  Patients are able to view lab/test results, encounter notes, upcoming appointments, etc.  Non-urgent messages can be sent to your provider as well.   To learn more about what you can do with MyChart, go to [email or web address].    Your next appointment:   3 month(s)  The format for your next appointment:   In Person  Provider:   Kate Sable, MD   Other Instructions    Signed, Kate Sable, MD  10/19/2020 12:11 PM    [place]

## 2020-10-20 DIAGNOSIS — Z992 Dependence on renal dialysis: Secondary | ICD-10-CM | POA: Diagnosis not present

## 2020-10-20 DIAGNOSIS — N186 End stage renal disease: Secondary | ICD-10-CM | POA: Diagnosis not present

## 2020-10-23 DIAGNOSIS — D509 Iron deficiency anemia, unspecified: Secondary | ICD-10-CM | POA: Diagnosis not present

## 2020-10-23 DIAGNOSIS — N186 End stage renal disease: Secondary | ICD-10-CM | POA: Diagnosis not present

## 2020-10-23 DIAGNOSIS — Z992 Dependence on renal dialysis: Secondary | ICD-10-CM | POA: Diagnosis not present

## 2020-10-23 DIAGNOSIS — N2581 Secondary hyperparathyroidism of renal origin: Secondary | ICD-10-CM | POA: Diagnosis not present

## 2020-10-25 ENCOUNTER — Telehealth: Payer: Self-pay

## 2020-10-25 NOTE — Chronic Care Management (AMB) (Signed)
  Care Management   Note  10/25/2020 Name: Marissa Hess MRN: [id number] DOB: 15-May-1934  Marissa Hess is a 86 y.o. year old female who is a primary care patient of Venita Lick, NP and is actively engaged with the care management team. I reached out to [place] by phone today to assist with re-scheduling a follow up visit with the RN Case Manager  Follow up plan: Unsuccessful telephone outreach attempt made. A HIPAA compliant phone message was left for the patient providing contact information and requesting a return call.  The care management team will reach out to the patient again over the next 7 days.  If patient returns call to provider office, please advise to call [facility]  at [place], [address] [place], [place], [place] [address] Direct Dial: [phone] [email or web address]: [email or web address]

## 2020-10-25 NOTE — Chronic Care Management (AMB) (Signed)
  Care Management   Note  10/25/2020 Name: Marissa Hess MRN: [id number] DOB: Mar 10, 1935  Marissa Hess is a 86 y.o. year old female who is a primary care patient of Venita Lick, NP and is actively engaged with the care management team. I reached out to Marissa Hess by phone today to assist with re-scheduling a follow up visit with the RN Case Manager  Follow up plan: Telephone appointment with care management team member scheduled for:10/27/2020  Marissa Hess, Marissa Hess, [address], [place] [address] Direct Dial: [phone] [email or web address]

## 2020-10-26 DIAGNOSIS — N186 End stage renal disease: Secondary | ICD-10-CM | POA: Diagnosis not present

## 2020-10-26 DIAGNOSIS — Z992 Dependence on renal dialysis: Secondary | ICD-10-CM | POA: Diagnosis not present

## 2020-10-27 ENCOUNTER — Ambulatory Visit (INDEPENDENT_AMBULATORY_CARE_PROVIDER_SITE_OTHER): Payer: Medicare Other | Admitting: General Practice

## 2020-10-27 ENCOUNTER — Telehealth: Payer: Medicare Other | Admitting: General Practice

## 2020-10-27 DIAGNOSIS — I4891 Unspecified atrial fibrillation: Secondary | ICD-10-CM

## 2020-10-27 DIAGNOSIS — I10 Essential (primary) hypertension: Secondary | ICD-10-CM

## 2020-10-27 DIAGNOSIS — N186 End stage renal disease: Secondary | ICD-10-CM | POA: Diagnosis not present

## 2020-10-27 DIAGNOSIS — I484 Atypical atrial flutter: Secondary | ICD-10-CM

## 2020-10-27 NOTE — Patient Instructions (Signed)
Visit Information  PATIENT GOALS:  Goals Addressed             This Visit's Progress    RNCM: Track and Manage Heart Rate and Rhythm-Atrial Fibrillation       Timeframe:  Long-Range Goal Priority:  High Start Date:      10-27-2020                       Expected End Date:     10-27-2021                  Follow Up Date 12/29/2020    - check pulse (heart) rate before taking medicine - check pulse (heart) rate once a day - make a plan to eat healthy - keep all lab appointments - take medicine as prescribed - wear medical alert identification    Why is this important?   Atrial fibrillation may have no symptoms. Sometimes the symptoms get worse or happen more often.  It is important to keep track of what your symptoms are and when they happen.  A change in symptoms is important to discuss with your doctor or nurse.  Being active and healthy eating will also help you manage your heart condition.     Notes: 10-27-2020: The patient recent aflutter/afib, scheduled for a DC cardioversion and did not have to have because her heart and converted back to a NSR. She was thankful that she converted. She is asking about assistance with Eliquis as it cost 236.00. States she has paperwork for the pharmacist. Will collaborate with the phram D for assistance with paperwork for Eliquis.        The patient verbalized understanding of instructions, educational materials, and care plan provided today and declined offer to receive copy of patient instructions, educational materials, and care plan.   Telephone follow up appointment with care management team member scheduled for: 12-29-2020 at 1:45 pm  [place], MSN, [facility] Mobile: [phone]

## 2020-10-27 NOTE — Chronic Care Management (AMB) (Signed)
Chronic Care Management   CCM RN Visit Note  10/27/2020 Name: Marissa Hess MRN: [id number] DOB: [birth year]  Subjective: Marissa Hess is a 86 y.o. year old female who is a primary care patient of Cannady, Henrine Screws T, NP. The care management team was consulted for assistance with disease management and care coordination needs.    Engaged with patient by telephone for follow up visit in response to provider referral for case management and/or care coordination services.   Consent to Services:  The patient was given information about Chronic Care Management services, agreed to services, and gave verbal consent prior to initiation of services.  Please see initial visit note for detailed documentation.   Patient agreed to services and verbal consent obtained.   Assessment: Review of patient past medical history, allergies, medications, health status, including review of consultants reports, laboratory and other test data, was performed as part of comprehensive evaluation and provision of chronic care management services.   SDOH (Social Determinants of Health) assessments and interventions performed:    CCM Care Plan  Allergies  Allergen Reactions   Codeine Itching   Ibuprofen     Avoids "because of lupus"   Sulfa Antibiotics Diarrhea   Vioxx [Rofecoxib]     Avoids "because of lupus"   Celebrex [Celecoxib] Rash    Avoids "because of lupus"   Penicillins Rash    Reaction: over 30 years ago    Outpatient Encounter Medications as of 10/27/2020  Medication Sig   apixaban (ELIQUIS) 2.5 MG TABS tablet Take 1 tablet (2.5 mg total) by mouth 2 (two) times daily.   carvedilol (COREG) 25 MG tablet Take 1 tablet (25 mg total) by mouth 2 (two) times daily with a meal.   cholecalciferol (VITAMIN D) 1000 units tablet Take 1,000 Units by mouth daily.    ferrous sulfate (SLOW FE) 160 (50 Fe) MG TBCR SR tablet Take 160 mg by mouth daily.   folic acid (FOLVITE) 1 MG tablet Take 1 tablet (1 mg  total) by mouth daily.   mirtazapine (REMERON) 7.5 MG tablet Take 7.5 mg by mouth at bedtime.   rOPINIRole (REQUIP) 0.5 MG tablet Take one tablet (0.5 MG) by mouth in the morning and then take two tablets (1 MG) by mouth at night before bedtime. (Patient taking differently: Take 0.5-1 mg by mouth See admin instructions. Take one tablet (0.5 MG) by mouth in the morning and then take two tablets (1 MG) by mouth at night before bedtime.)   No facility-administered encounter medications on file as of 10/27/2020.    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[place]) 08/20/2018   B12 deficiency 01/26/2018   ESRD (end stage renal disease) ([place]) 01/23/2018   Anemia associated with stage 4 chronic renal failure ([place]) 10/20/2017   Advanced care planning/counseling discussion 09/25/2016   Gout 10/16/2015   Claudication of both lower extremities ([place]) 10/16/2015   Raynaud's phenomenon without gangrene 04/03/2015   Restless leg syndrome 09/26/2014   Hypertensive nephropathy 08/19/2014   Hyperlipemia 08/19/2014   Allergic rhinitis 08/19/2014   Senile osteoporosis 08/19/2014   Esophageal reflux 08/19/2014    Conditions to be addressed/monitored:Atrial Fibrillation, HTN, and ESRD  Care Plan : RNCM: Hypertension (Adult)  Updates made by Vanita Ingles since 10/27/2020 12:00 AM     Problem: RNCM: Hypertension (Hypertension)   Priority: Medium     Long-Range Goal: RNCM:  Hypertension Monitored   Start Date: 04/18/2020  Expected End Date: 10/22/2021  This Visit's Progress: On track  Priority: Medium  Note:   Objective:  Last practice recorded BP readings:  BP Readings from Last 3 Encounters:  10/19/20 132/70  10/04/20 (!) 158/80  09/13/20 128/78   Most recent eGFR/CrCl: No results found for: EGFR  No components found for: CRCL Current Barriers:  Knowledge Deficits related to basic understanding  of hypertension pathophysiology and self care management Limited Social Support Lacks social connections Does not contact provider office for questions/concerns Case Manager Clinical Goal(s):   patient will verbalize understanding of plan for hypertension management  patient will attend all scheduled medical appointments: 11-08-2020 at 1020 am, cardiology in November of 2022  patient will demonstrate improved adherence to prescribed treatment plan for hypertension as evidenced by taking all medications as prescribed, monitoring and recording blood pressure as directed, adhering to low sodium/DASH diet  patient will demonstrate improved health management independence as evidenced by checking blood pressure as directed and notifying PCP if SBP>160 or DBP > 90, taking all medications as prescribe, and adhering to a low sodium diet as discussed.  patient will verbalize basic understanding of hypertension disease process and self health management plan as evidenced by compliance with diet, medications, and working with the CCM team to meet health and wellness goals.  Interventions:  Collaboration with Venita Lick, NP regarding development and update of comprehensive plan of care as evidenced by provider attestation and co-signature Inter-disciplinary care team collaboration (see longitudinal plan of care) Evaluation of current treatment plan related to hypertension self management and patient's adherence to plan as established by provider. 10-27-2020: The patient is stable. Denies any new concerns with HTN, eating well most days, is having some days that the food she eats goes right through her, she is trying to monitor to see if it is a specific food, getting rest, pacing activity. Denies any new concerns. Provided education to patient re: stroke prevention, s/s of heart attack and stroke, DASH diet, complications of uncontrolled blood pressure Reviewed medications with patient and discussed  importance of compliance. 10-27-2020: Is compliant with medications. Denies any issues with medications for HTN Discussed plans with patient for ongoing care management follow up and provided patient with direct contact information for care management team Advised patient, providing education and rationale, to monitor blood pressure daily and record, calling PCP for findings outside established parameters.  Reviewed scheduled/upcoming provider appointments including: 11-08-2020 Patient Goals/Self-Care Activities patient will:  - Self administers medications as prescribed Attends all scheduled provider appointments Calls provider office for new concerns, questions, or BP outside discussed parameters Checks BP and records as discussed Follows a low sodium diet/DASH diet - blood pressure trends reviewed - depression screen reviewed - home or ambulatory blood pressure monitoring encouraged  Follow Up Plan: Telephone follow up appointment with care management team member scheduled for: 12-29-2020 at 1:45 pm     Task: RNCM: Identify and Monitor Blood Pressure Elevation Completed 10/27/2020  Outcome: Positive  Note:   Care Management Activities:    - blood pressure trends reviewed - depression screen reviewed - home or ambulatory blood pressure monitoring encouraged        Care Plan : RNCM: Management of ESRD  Updates made by Vanita Ingles since 10/27/2020 12:00 AM     Problem: Adjustment to Chronic Kidney Disease      Long-Range Goal: RNCM: Management of ESRD   Start Date: 04/18/2020  Expected End Date:  10/22/2021  This Visit's Progress: On track  Priority: Medium  Note:   Current Barriers:  Chronic Disease Management support and education needs related to ESRD Lacks caregiver support.  Unable to independently manage ESRD Lacks social connections Does not contact provider office for questions/concerns  Nurse Case Manager Clinical Goal(s):   patient will verbalize understanding  of plan for effective management of ESRD  patient will work with [facility] and pcp to address needs related to ESRD  patient will attend all scheduled medical appointments: 11-08-2020  patient will demonstrate improved health management independence as evidenced bystablized condition, no exacerbations from ESRD, compliance with medications   Interventions:  1:1 collaboration with Venita Lick, NP regarding development and update of comprehensive plan of care as evidenced by provider attestation and co-signature Inter-disciplinary care team collaboration (see longitudinal plan of care) Evaluation of current treatment plan related to ESRD and patient's adherence to plan as established by provider. 10-26-2020: The patient still taking dialysis on Monday and Friday. The patient states she does not like to go and was really down about it, but talked to a counselor and they helped her through her feels. Reviewed with the patient the support she has and to reach out when she is feeling down and out.  The patient states they never have to pull any fluid off of her they just have to "clean" her blood. She denies any issues with her ESRD regimen at this time. Will continue to monitor. Has been doing dialysis since 2019.  Advised patient to call the office for changes in condition or questions  Provided education to patient re: maintaining dialysis schedule, eating heart healthy renal diet, following recommendations by provider. 10-26-2020: Reviewed with the patient and compliance noted. Denies any concerns with getting prescription medications.  Reviewed scheduled/upcoming provider appointments including: 11-08-2020 Discussed plans with patient for ongoing care management follow up and provided patient with direct contact information for care management team  Patient Goals/Self-Care Activities  patient will:  - Patient will self administer medications as prescribed Patient will attend all scheduled provider  appointments Patient will call pharmacy for medication refills Patient will attend church or other social activities Patient will continue to perform IADL's independently Patient will call provider office for new concerns or questions Patient will work with BSW to address care coordination needs and will continue to work with the clinical team to address health care and disease management related needs.   - decision-making supported - depression screen reviewed - goal setting facilitated - self-care encouraged - self-reflection promoted - verbalization of feelings encouraged  Follow Up Plan: Telephone follow up appointment with care management team member scheduled for: 12-29-2020 at 1:45 pm        Task: RNCM: Support Psychological Response to Chronic Kidney Disease Completed 10/27/2020  Outcome: Positive  Note:   Care Management Activities:    - decision-making supported - depression screen reviewed - goal setting facilitated - self-care encouraged - self-reflection promoted - verbalization of feelings encouraged        Care Plan : RNCM: Atrial Fibrillation (Adult)  Updates made by Vanita Ingles since 10/27/2020 12:00 AM     Problem: RNCM: Dysrhythmia (Atrial Fibrillation)   Priority: High     Long-Range Goal: RNCM: Heart Rate and Rhythm Monitored and Managed   Start Date: 10/27/2020  Expected End Date: 10/27/2021  This Visit's Progress: On track  Priority: High  Note:   Current Barriers:  Knowledge deficits related to self health management of chronic  afib Knowledge Deficits related to how to submit paperwork for Eliquis support due to 236.00 cost.  Care Coordination needs related to Eliquis cost constraints  in a patient with chronic Aflutter/Afib Chronic Disease Management support and education needs related to medications cost constraints for Eliquis for effective management of AFIB Financial Constraints.  Difficulty obtaining medications Unable to  independently manage AFIB/AFLUTTER Nurse Case Manager Clinical Goal(s):  patient will take all medications exactly as prescribed and will call provider for medication related questions patient will verbalize understanding of Afib Action Plan and when to call doctor patient will verbalize understanding of plan for effective management of AFIB/Aflutter and being able to get needed medications to keep AFIB/Aflutter under control patient will work with CCM team, pcp and cardiologist  to address needs related to medications for AFIB/Aflutter and effective management of heart dysrythmias  patient will demonstrate a decrease in AFIB/Aflutter  exacerbations patient will take all medications exactly as prescribed and will call provider for medication related questions patient will attend all scheduled medical appointments: 11-08-2020 at 10:20 am, will see cardiology for ECHO on 01-23-2021 and cardiologist on 02-01-2021 to get ECHO report.  patient will demonstrate improved adherence to prescribed treatment plan for AFB/AFLUTTER patient will demonstrate improved health management independence patient will demonstrate understanding of rationale for each prescribed medication patient will work with CM team pharmacist to get assistance with Eliquis.  the patient will demonstrate ongoing self health care management ability Interventions:  Collaboration with Venita Lick, NP regarding development and update of comprehensive plan of care as evidenced by provider attestation and co-signature Inter-disciplinary care team collaboration (see longitudinal plan of care) Basic overview and discussion of afib disease state Medications reviewed. 10-27-2020: The patient has a 90 day supply of Eliquis but has paperwork she needs to submit to the pharmacist for assistance with Eliquis. She can not afford the 236.00. Ask the [facility] to communicate the need to talk with the pharm D concerning when she can bring the paperwork  into the office and get assistance.  Afib action plan reviewed Evaluation of current treatment plan related to AFIB/AFLUTTER and patient's adherence to plan as established by provider. The patient was in the ER in July for her heart being out of rhythm. The patient was scheduled for a cardioversion and went but did not have to have the cardioversion because her heart hand converted on its own back to a NSR. The patient was thankful for this. She will follow up with the Cardiologist with an ECHO and visit in November. She is taking her medications as ordered but does want to talk to pharm D about assistance with Eliquis. Education provided. Will collaborate with the pharm D.  Advised patient to call the office for any changes in condition, questions, or concerns Provided education to patient re: taking medications as prescribed, monitoring for changes in condition and working with the CCM team to effectively manage AFIB/AFLUTTER Collaborated with pharm D regarding paperwork the patient has to get assistance for help with paying for Eliquis Reviewed scheduled/upcoming provider appointments including: 11-08-2020 at 1020 am Pharmacy referral for assistance with Eliquis Self-Care Activities: Patient verbalizes understanding of plan to effective management of AFIB/AFlutter Self administers medications as prescribed Attends all scheduled provider appointments Calls pharmacy for medication refills Attends church or other social activities Performs ADL's independently Performs IADL's independently Calls provider office for new concerns or questions Patient Goals: - check pulse (heart) rate before taking medicine - check pulse (heart) rate once a day -  make a plan to exercise regularly - make a plan to eat healthy - keep all lab appointments - take medicine as prescribed - wear medical alert identification Follow Up Plan: Telephone follow up appointment with care management team member scheduled for:   12-29-2020 at 1:45 pm    Task: Alleviate Barriers to Dysrhythmia Management Completed 10/27/2020  Outcome: Positive  Note:   Care Management Activities:    - activity or exercise based on tolerance encouraged - barriers to lifestyle changes reviewed and addressed - medication-adherence assessment completed - medication side effects monitored and managed - participation in cardiac rehabilitation encouraged - pulse rate and rhythm assessed - reassurance provided - rescue (action) plan developed - response to pharmacologic therapy monitored - screen for functional limitations reviewed - rescue (action) plan use encouraged    Notes:      Plan:Telephone follow up appointment with care management team member scheduled for:  12-29-2020 at 1:45 pm  Noreene Larsson RN, MSN, [facility] Mobile: [phone]

## 2020-10-31 DIAGNOSIS — Z992 Dependence on renal dialysis: Secondary | ICD-10-CM | POA: Diagnosis not present

## 2020-10-31 DIAGNOSIS — N186 End stage renal disease: Secondary | ICD-10-CM | POA: Diagnosis not present

## 2020-11-01 ENCOUNTER — Telehealth: Payer: Self-pay

## 2020-11-03 DIAGNOSIS — N186 End stage renal disease: Secondary | ICD-10-CM | POA: Diagnosis not present

## 2020-11-03 DIAGNOSIS — Z992 Dependence on renal dialysis: Secondary | ICD-10-CM | POA: Diagnosis not present

## 2020-11-04 ENCOUNTER — Encounter: Payer: Self-pay | Admitting: Internal Medicine

## 2020-11-04 ENCOUNTER — Encounter: Payer: Self-pay | Admitting: Nurse Practitioner

## 2020-11-06 DIAGNOSIS — N186 End stage renal disease: Secondary | ICD-10-CM | POA: Diagnosis not present

## 2020-11-06 DIAGNOSIS — Z992 Dependence on renal dialysis: Secondary | ICD-10-CM | POA: Diagnosis not present

## 2020-11-07 DIAGNOSIS — H353221 Exudative age-related macular degeneration, left eye, with active choroidal neovascularization: Secondary | ICD-10-CM | POA: Diagnosis not present

## 2020-11-08 ENCOUNTER — Other Ambulatory Visit: Payer: Self-pay

## 2020-11-08 ENCOUNTER — Encounter: Payer: Self-pay | Admitting: Nurse Practitioner

## 2020-11-08 ENCOUNTER — Ambulatory Visit (INDEPENDENT_AMBULATORY_CARE_PROVIDER_SITE_OTHER): Payer: Medicare Other | Admitting: Nurse Practitioner

## 2020-11-08 VITALS — BP 140/76 | HR 69 | Temp 97.4°F | Wt 130.4 lb

## 2020-11-08 DIAGNOSIS — I129 Hypertensive chronic kidney disease with stage 1 through stage 4 chronic kidney disease, or unspecified chronic kidney disease: Secondary | ICD-10-CM

## 2020-11-08 DIAGNOSIS — D631 Anemia in chronic kidney disease: Secondary | ICD-10-CM | POA: Diagnosis not present

## 2020-11-08 DIAGNOSIS — M545 Low back pain, unspecified: Secondary | ICD-10-CM

## 2020-11-08 DIAGNOSIS — I272 Pulmonary hypertension, unspecified: Secondary | ICD-10-CM

## 2020-11-08 DIAGNOSIS — I483 Typical atrial flutter: Secondary | ICD-10-CM

## 2020-11-08 DIAGNOSIS — N186 End stage renal disease: Secondary | ICD-10-CM

## 2020-11-08 DIAGNOSIS — Z992 Dependence on renal dialysis: Secondary | ICD-10-CM | POA: Diagnosis not present

## 2020-11-08 MED ORDER — LIDOCAINE 5 % EX PTCH: 1.0000 | MEDICATED_PATCH | CUTANEOUS | 0 refills | Status: AC

## 2020-11-08 NOTE — Assessment & Plan Note (Signed)
Acute x one week, suspect more muscular.  Offered renal dose Tizanidine, patient would prefer to avoid pills, which is acceptable.  Will send in Lidocaine patches to use for lower back discomfort.  Continue Tylenol as needed.  Recommend she use heating pad and take to dialysis during long sitting sessions.  Return to office if worsening or ongoing.

## 2020-11-08 NOTE — Assessment & Plan Note (Signed)
Diagnosed 09/06/20.  At this time continue Eliquis as ordered and Carvedilol, symptoms overall improved and rate controlled.  Educated her on diagnosis and when to seek immediate care.  At this time no cardioversion was needed.  Will continue collaboration with cardiology.  Return in 6 months.

## 2020-11-08 NOTE — Progress Notes (Deleted)
Chronic Care Management Pharmacy Note  11/08/2020 Name:  Marissa Hess MRN:  [id number] DOB:  Jun 09, 1934  Summary:  Recommendations/Changes made from today's visit:  Plan:  Subjective: Marissa Hess is an 86 y.o. year old female who is a primary patient of Cannady, Henrine Screws T, NP.  The CCM team was consulted for assistance with disease management and care coordination needs.    {CCMTELEPHONEFACETOFACE:21091510} for {CCMINITIALFOLLOWUPCHOICE:21091511} in response to provider referral for pharmacy case management and/or care coordination services.   Consent to Services:  {CCMCONSENTOPTIONS:25074}  Patient Care Team: Venita Lick, NP as PCP - General (Nurse Practitioner) Lutricia Horsfall, [name], MD as Referring Physician (Ophthalmology) Vanita Ingles, RN as Case Manager (General Practice)  Recent office visits: ***  Recent consult visits: [facility] visits: {Hospital DC Yes/No:21091515}  Objective:  Lab Results  Component Value Date   CREATININE 1.43 (H) 09/25/2020   CREATININE 2.39 (H) 09/06/2020   CREATININE 2.48 (H) 11/26/2018    No results found for: HGBA1C Last diabetic Eye exam: No results found for: HMDIABEYEEXA  Last diabetic Foot exam: No results found for: HMDIABFOOTEX      Component Value Date/Time   CHOL 187 11/26/2018 1346   TRIG 110 11/26/2018 1346   HDL 51 11/26/2018 1346   CHOLHDL 6.5 (H) 10/08/2017 1304   LDLCALC 116 (H) 11/26/2018 1346    Hepatic Function Latest Ref Rng & Units 09/06/2020 11/26/2018 01/26/2018  Total Protein 6.0 - 8.5 g/dL 7.0 6.9 8.1  Albumin 3.6 - 4.6 g/dL 3.4(L) 3.4(L) 3.3(L)  AST 0 - 40 IU/L '11 11 16  ' ALT 0 - 32 IU/L '5 7 9  ' Alk Phosphatase 44 - 121 IU/L 53 78 33(L)  Total Bilirubin 0.0 - 1.2 mg/dL 1.2 0.5 0.3    Lab Results  Component Value Date/Time   TSH 3.610 09/06/2020 10:35 AM   TSH 3.570 11/26/2018 01:46 PM    CBC Latest Ref Rng & Units 09/25/2020  09/06/2020 11/26/2018  WBC 4.0 - 10.5 K/uL 4.9 5.9 5.1  Hemoglobin 12.0 - 15.0 g/dL 11.0(L) 10.9(L) 11.2  Hematocrit 36.0 - 46.0 % 33.9(L) 33.5(L) 34.4  Platelets 150 - 400 K/uL 198 201 207    No results found for: VD25OH  Clinical ASCVD: {YES/NO:21197} The ASCVD Risk score Mikey Bussing DC Jr., et al., 2013) failed to calculate for the following reasons:   The 2013 ASCVD risk score is only valid for ages 66 to 67   The patient has a prior MI or stroke diagnosis    Other: (CHADS2VASc if Afib, PHQ9 if depression, MMRC or CAT for COPD, ACT, DEXA)  Social History   Tobacco Use  Smoking Status Former   Types: Cigarettes   Start date: 09/11/1984   Quit date: 09/26/1994   Years since quitting: 26.1  Smokeless Tobacco Never   BP Readings from Last 3 Encounters:  11/08/20 140/76  10/19/20 132/70  10/04/20 (!) 158/80   Pulse Readings from Last 3 Encounters:  11/08/20 69  10/19/20 73  10/04/20 65   Wt Readings from Last 3 Encounters:  11/08/20 130 lb 6.4 oz (59.1 kg)  10/19/20 131 lb 8 oz (59.6 kg)  10/04/20 130 lb (59 kg)    Assessment: Review of patient past medical history, allergies, medications, health status, including review of consultants reports, laboratory and other test data, was performed as part of comprehensive evaluation and provision of chronic care management services.   SDOH:  (Social Determinants of Health)  assessments and interventions performed:    CCM Care Plan  Allergies  Allergen Reactions   Codeine Itching   Ibuprofen     Avoids "because of lupus"   Sulfa Antibiotics Diarrhea   Vioxx [Rofecoxib]     Avoids "because of lupus"   Celebrex [Celecoxib] Rash    Avoids "because of lupus"   Penicillins Rash    Reaction: over 30 years ago    Medications Reviewed Today     Reviewed by Venita Lick, NP (Nurse Practitioner) on 11/08/20 at 1100  Med List Status: <None>   Medication Order Taking? Sig Documenting Provider Last Dose Status Informant   apixaban (ELIQUIS) 2.5 MG TABS tablet [id number] Yes Take 1 tablet (2.5 mg total) by mouth 2 (two) times daily. Marnee Guarneri T, NP Taking Active Self  carvedilol (COREG) 25 MG tablet [id number] Yes Take 1 tablet (25 mg total) by mouth 2 (two) times daily with a meal. Cannady, Jolene T, NP Taking Active Self  cholecalciferol (VITAMIN D) 1000 units tablet [id number] Yes Take 1,000 Units by mouth daily.  [provider] Taking Active Self  ferrous sulfate (SLOW FE) 160 (50 Fe) MG TBCR SR tablet [id number] Yes Take 160 mg by mouth daily. [provider] Taking Active Self  folic acid (FOLVITE) 1 MG tablet [id number] Yes Take 1 tablet (1 mg total) by mouth daily. Park Liter P, DO Taking Active Self  lidocaine (LIDODERM) 5 % [id number] Yes Place 1 patch onto the skin daily. Remove & Discard patch within 12 hours or as directed by MD Venita Lick, NP  Active   mirtazapine (REMERON) 7.5 MG tablet [id number] Yes Take 7.5 mg by mouth at bedtime. [provider] Taking Active Self  rOPINIRole (REQUIP) 0.5 MG tablet [id number] Yes Take one tablet (0.5 MG) by mouth in the morning and then take two tablets (1 MG) by mouth at night before bedtime.  Patient taking differently: Take 0.5-1 mg by mouth See admin instructions. Take one tablet (0.5 MG) by mouth in the morning and then take two tablets (1 MG) by mouth at night before bedtime.   Venita Lick, NP Taking Active Self            Patient Active Problem List   Diagnosis Date Noted   Pulmonary hypertension ([place]) 11/08/2020   Low back pain 11/08/2020   Atrial flutter ([place]) 09/06/2020   Poor mobility 05/25/2019   Dependence on renal dialysis ([place]) 08/20/2018   B12 deficiency 01/26/2018   ESRD (end stage renal disease) ([place]) 01/23/2018   Anemia in CKD (chronic kidney disease) 10/20/2017   Advanced care planning/counseling discussion 09/25/2016   Gout 10/16/2015   Claudication of both lower extremities ([place])  10/16/2015   Raynaud's phenomenon without gangrene 04/03/2015   Restless leg syndrome 09/26/2014   Hypertensive nephropathy 08/19/2014   Hyperlipemia 08/19/2014   Allergic rhinitis 08/19/2014   Senile osteoporosis 08/19/2014   Esophageal reflux 08/19/2014    Immunization History  Administered Date(s) Administered   Fluad Quad(high Dose 65+) 11/26/2018, 01/11/2020   Hepatitis B, adult 02/06/2018, 03/06/2018, 04/08/2018, 08/07/2018   Influenza, High Dose Seasonal PF 12/20/2016   Influenza,inj,Quad PF,6+ Mos 03/06/2015, 01/21/2020   Influenza-Unspecified 12/17/2015, 01/16/2018, 02/13/2018, 11/23/2018, 12/10/2018, 12/27/2019   Moderna Sars-Covid-2 Vaccination 05/15/2019, 06/12/2019, 02/16/2020   PPD Test 02/06/2018, 02/16/2018, 03/21/2020   Pneumococcal Polysaccharide-23 02/20/2018   Pneumococcal-Unspecified 07/28/2008   Tdap 05/26/2012    Conditions to be addressed/monitored: {CCM ASSESSMENT DISEASE OPTIONS:25047}  There are no care plans that you  recently modified to display for this patient.   Medication Assistance: {MEDASSISTANCEINFO:25044}  Patient's preferred pharmacy is:  [facility], [place] [address] [address] Phone: [phone] Fax: [facility] [address]), [place])  [address] Phone: [phone] Fax: [phone]  Uses pill box? {Yes or If no, why not?:20788} Pt endorses ***% compliance  Follow Up:  {FOLLOWUP:24991}  Plan: {CM FOLLOW UP MJKM:60789}  SIG***   Future Appointments  Date Time Provider [place]  12/29/2020  1:45 PM CFP CCM CASE MANAGER [facility]  01/23/2021 10:30 AM [facility] [place] [facility]  02/01/2021 10:00 AM Kate Sable, MD [facility]  02/06/2021  1:00 PM [facility] VASC 2 AV[facility]-IMG None  02/06/2021  2:00 PM Kris Hartmann, NP [facility] None   05/15/2021  9:40 AM Marnee Guarneri T, NP [facility]FP[facility]FP PEC  08/31/2021  1:00 PM CFP NURSE HEALTH ADVISOR CFP-CFP PEC

## 2020-11-08 NOTE — Progress Notes (Signed)
BP 140/76   Pulse 69   Temp (!) 97.4 F (36.3 C) (Oral)   Wt 130 lb 6.4 oz (59.1 kg)   SpO2 98%   BMI 23.10 kg/m    Subjective:    Patient ID: Marissa Hess, female    DOB: 20-Jun-1934, 86 y.o.   MRN: [id number]  HPI: Marissa Hess is a 86 y.o. female  Chief Complaint  Patient presents with   Chronic Kidney Disease   Atrial Flutter   Back Pain    Patient states she has been having issues with back pain off and on for about a week. Patient describes the patient as a sharp shooting pain. Patient states she will take Tylenol to help ease the discomfort.    ATRIAL FLUTTER Diagnosed by PCP on 09/06/20.  Started on Eliquis 2.5 MG BID and continued Carvedilol.   Seen by cardiology 09/07/20 and noted to have atrial flutter on EKG with them -- plan is to continue anticoagulation. Was planned to have cardioversion on 10/04/20, but did not need it.  Last saw cardiology on 10/19/20 -- is to stay on Coreg and Eliquis -- she was noted to have some pulmonary hypertension on recent echo and plan is to repeat in 3 months and if ongoing send to pulmonary hypertension clinic.  EF 55%.   Atrial fibrillation status: uncontrolled Satisfied with current treatment: yes  Medication side effects:  no Medication compliance: good compliance Etiology of atrial fibrillation:  Palpitations:  no -- improving Chest pain:  no Dyspnea on exertion:  no Orthopnea:  no Syncope:  no Edema:  no Ventricular rate control: B-blocker Anti-coagulation: long acting   CHRONIC KIDNEY DISEASE (ESRD) Continues on dialysis on Monday and Friday.  CRT in June 2.39 and eGFR 20 and H/H 10.9/33.5.   CKD status: stable Medications renally dose: yes Previous renal evaluation: yes Pneumovax:  Up to Date Influenza Vaccine:  Up to Date   BACK PAIN Started about one week ago, catches on her sometimes to lower back.  Is taking powder Tylenol which is helping.  Noticed after waking up one day. Duration: weeks Mechanism of injury:  unknown Location: midline, bilateral, and low back Onset: sudden Severity: 7/10 Quality: dull, aching, and throbbing Frequency: intermittent Radiation: none Aggravating factors: prolonged sitting Alleviating factors: APAP Status: stable Treatments attempted: APAP  Relief with NSAIDs?: No NSAIDs Taken Nighttime pain:  no Paresthesias / decreased sensation:  no Bowel / bladder incontinence:  no Fevers:  no Dysuria / urinary frequency:  no   Relevant past medical, surgical, family and social history reviewed and updated as indicated. Interim medical history since our last visit reviewed. Allergies and medications reviewed and updated.  Review of Systems  Constitutional:  Negative for activity change, appetite change, diaphoresis, fatigue and fever.  Respiratory:  Negative for cough, chest tightness and shortness of breath.   Cardiovascular:  Negative for chest pain, palpitations and leg swelling.  Gastrointestinal: Negative.   Neurological: Negative.   Psychiatric/Behavioral: Negative.     Per HPI unless specifically indicated above     Objective:    BP 140/76   Pulse 69   Temp (!) 97.4 F (36.3 C) (Oral)   Wt 130 lb 6.4 oz (59.1 kg)   SpO2 98%   BMI 23.10 kg/m   Wt Readings from Last 3 Encounters:  11/08/20 130 lb 6.4 oz (59.1 kg)  10/19/20 131 lb 8 oz (59.6 kg)  10/04/20 130 lb (59 kg)    Physical Exam Vitals  and nursing note reviewed.  Constitutional:      General: She is awake. She is not in acute distress.    Appearance: She is well-developed and underweight. She is not ill-appearing or toxic-appearing.     Comments: Frail appearing.  HENT:     Head: Normocephalic and atraumatic.     Right Ear: Hearing normal.     Left Ear: Hearing normal.  Eyes:     General: Lids are normal.        Right eye: No discharge.        Left eye: No discharge.     Conjunctiva/sclera: Conjunctivae normal.     Pupils: Pupils are equal, round, and reactive to light.  Neck:      Vascular: No carotid bruit.  Cardiovascular:     Rate and Rhythm: Normal rate and regular rhythm.     Heart sounds: Normal heart sounds. No murmur heard.   No gallop.     Arteriovenous access: Right arteriovenous access is present. Pulmonary:     Effort: Pulmonary effort is normal. No accessory muscle usage or respiratory distress.     Breath sounds: Normal breath sounds.  Abdominal:     General: Bowel sounds are normal.     Palpations: Abdomen is soft.  Musculoskeletal:     Cervical back: Normal range of motion and neck supple.     Lumbar back: Tenderness present. No swelling or edema. Normal range of motion. Negative right straight leg raise test and negative left straight leg raise test.     Right lower leg: No edema.     Left lower leg: No edema.     Comments: Mild tenderness to lower back on palpation, no rashes.  Muscles tight.  Skin:    General: Skin is warm and dry.  Neurological:     Mental Status: She is alert and oriented to person, place, and time.     Cranial Nerves: Cranial nerves are intact.     Motor: Motor function is intact.     Coordination: Coordination is intact.     Gait: Gait is intact.     Deep Tendon Reflexes: Reflexes are normal and symmetric.     Reflex Scores:      Brachioradialis reflexes are 2+ on the right side and 2+ on the left side.      Patellar reflexes are 2+ on the right side and 2+ on the left side. Psychiatric:        Attention and Perception: Attention normal.        Mood and Affect: Mood normal.        Speech: Speech normal.        Behavior: Behavior normal. Behavior is cooperative.        Thought Content: Thought content normal.        Judgment: Judgment normal.   Results for orders placed or performed in visit on 09/26/20  ECHOCARDIOGRAM COMPLETE  Result Value Ref Range   AR max vel 2.52 cm2   AV Peak grad 4.4 mmHg   Ao pk vel 1.05 m/s   S' Lateral 3.00 cm   Area-P 1/2 4.01 cm2   AV Area VTI 2.28 cm2   AV Mean grad 2.0 mmHg    Single Plane A4C EF 53.4 %   Single Plane A2C EF 51.1 %   Calc EF 52.1 %   AV Area mean vel 2.36 cm2      Assessment & Plan:   Problem List Items Addressed This  Visit       Cardiovascular and Mediastinum   Atrial flutter ([place]) - Primary    Diagnosed 09/06/20.  At this time continue Eliquis as ordered and Carvedilol, symptoms overall improved and rate controlled.  Educated her on diagnosis and when to seek immediate care.  At this time no cardioversion was needed.  Will continue collaboration with cardiology.  Return in 6 months.      Pulmonary hypertension ([place])    Noted on recent echo with cardiology, plan is to repeat echo in 3 months.  Recent note and imaging reviewed.  Will continue this collaboration.        Genitourinary   Hypertensive nephropathy    Chronic, ongoing with ESRD.  Continue collaboration with nephrology and current medication regimen. BP at goal for her age today.  Recent labs with dialysis, called [facility] to request these be sent.        ESRD (end stage renal disease) ([facility])    Chronic, ongoing.  Receives dialysis.  Continue current medication regimen and collaboration with nephrology team.  Will request dialysis labs, as none noted in Epic to review.  Last labs in office June 2022.        Other   Anemia in CKD (chronic kidney disease)    Chronic, ongoing, followed by nephrology and obtaining dialysis. Will continue this collaboration and refer to hematology if worsening.      Dependence on renal dialysis ([facility])    Chronic, ongoing.  Continue collaboration with dialysis team and nephrology.      Low back pain    Acute x one week, suspect more muscular.  Offered renal dose Tizanidine, patient would prefer to avoid pills, which is acceptable.  Will send in Lidocaine patches to use for lower back discomfort.  Continue Tylenol as needed.  Recommend she use heating pad and take to dialysis during long sitting sessions.  Return to office if worsening or ongoing.         Follow up plan: Return in about 6 months (around 05/11/2021) for CKD, Atrial Flutter, HTN/HLD, RA, RLS.

## 2020-11-08 NOTE — Patient Instructions (Signed)
Acute Back Pain, Adult Acute back pain is sudden and usually short-lived. It is often caused by an injury to the muscles and tissues in the back. The injury may result from: A muscle or ligament getting overstretched or torn (strained). Ligaments are tissues that connect bones to each other. Lifting something improperly can cause a back strain. Wear and tear (degeneration) of the spinal disks. Spinal disks are circular tissue that provide cushioning between the bones of the spine (vertebrae). Twisting motions, such as while playing sports or doing yard work. A hit to the back. Arthritis. You may have a physical exam, lab tests, and imaging tests to find the cause ofyour pain. Acute back pain usually goes away with rest and home care. Follow these instructions at home: Managing pain, stiffness, and swelling Treatment may include medicines for pain and inflammation that are taken by mouth or applied to the skin, prescription pain medicine, or muscle relaxants. Take over-the-counter and prescription medicines only as told by your health care provider. Your health care provider may recommend applying ice during the first 24-48 hours after your pain starts. To do this: Put ice in a plastic bag. Place a towel between your skin and the bag. Leave the ice on for 20 minutes, 2-3 times a day. If directed, apply heat to the affected area as often as told by your health care provider. Use the heat source that your health care provider recommends, such as a moist heat pack or a heating pad. Place a towel between your skin and the heat source. Leave the heat on for 20-30 minutes. Remove the heat if your skin turns bright red. This is especially important if you are unable to feel pain, heat, or cold. You have a greater risk of getting burned. Activity  Do not stay in bed. Staying in bed for more than 1-2 days can delay your recovery. Sit up and stand up straight. Avoid leaning forward when you sit or  hunching over when you stand. If you work at a desk, sit close to it so you do not need to lean over. Keep your chin tucked in. Keep your neck drawn back, and keep your elbows bent at a 90-degree angle (right angle). Sit high and close to the steering wheel when you drive. Add lower back (lumbar) support to your car seat, if needed. Take short walks on even surfaces as soon as you are able. Try to increase the length of time you walk each day. Do not sit, drive, or stand in one place for more than 30 minutes at a time. Sitting or standing for long periods of time can put stress on your back. Do not drive or use heavy machinery while taking prescription pain medicine. Use proper lifting techniques. When you bend and lift, use positions that put less stress on your back: Bend your knees. Keep the load close to your body. Avoid twisting. Exercise regularly as told by your health care provider. Exercising helps your back heal faster and helps prevent back injuries by keeping muscles strong and flexible. Work with a physical therapist to make a safe exercise program, as recommended by your health care provider. Do any exercises as told by your physical therapist.  Lifestyle Maintain a healthy weight. Extra weight puts stress on your back and makes it difficult to have good posture. Avoid activities or situations that make you feel anxious or stressed. Stress and anxiety increase muscle tension and can make back pain worse. Learn ways to manage   anxiety and stress, such as through exercise. General instructions Sleep on a firm mattress in a comfortable position. Try lying on your side with your knees slightly bent. If you lie on your back, put a pillow under your knees. Follow your treatment plan as told by your health care provider. This may include: Cognitive or behavioral therapy. Acupuncture or massage therapy. Meditation or yoga. Contact a health care provider if: You have pain that is not  relieved with rest or medicine. You have increasing pain going down into your legs or buttocks. Your pain does not improve after 2 weeks. You have pain at night. You lose weight without trying. You have a fever or chills. Get help right away if: You develop new bowel or bladder control problems. You have unusual weakness or numbness in your arms or legs. You develop nausea or vomiting. You develop abdominal pain. You feel faint. Summary Acute back pain is sudden and usually short-lived. Use proper lifting techniques. When you bend and lift, use positions that put less stress on your back. Take over-the-counter and prescription medicines and apply heat or ice as directed by your health care provider. This information is not intended to replace advice given to you by your health care provider. Make sure you discuss any questions you have with your healthcare provider. Document Revised: 11/23/2019 Document Reviewed: 11/26/2019 Elsevier Patient Education  2022 Elsevier Inc.  

## 2020-11-08 NOTE — Assessment & Plan Note (Signed)
Chronic, ongoing.  Continue collaboration with dialysis team and nephrology.

## 2020-11-08 NOTE — Assessment & Plan Note (Signed)
Chronic, ongoing, followed by nephrology and obtaining dialysis. Will continue this collaboration and refer to hematology if worsening.

## 2020-11-08 NOTE — Assessment & Plan Note (Signed)
Chronic, ongoing.  Receives dialysis.  Continue current medication regimen and collaboration with nephrology team.  Will request dialysis labs, as none noted in Epic to review.  Last labs in office June 2022.

## 2020-11-08 NOTE — Assessment & Plan Note (Signed)
Chronic, ongoing with ESRD.  Continue collaboration with nephrology and current medication regimen. BP at goal for her age today.  Recent labs with dialysis, called [facility] to request these be sent.

## 2020-11-08 NOTE — Assessment & Plan Note (Signed)
Noted on recent echo with cardiology, plan is to repeat echo in 3 months.  Recent note and imaging reviewed.  Will continue this collaboration.

## 2020-11-10 DIAGNOSIS — N186 End stage renal disease: Secondary | ICD-10-CM | POA: Diagnosis not present

## 2020-11-10 DIAGNOSIS — Z992 Dependence on renal dialysis: Secondary | ICD-10-CM | POA: Diagnosis not present

## 2020-11-13 ENCOUNTER — Inpatient Hospital Stay
Admission: EM | Admit: 2020-11-13 | Discharge: 2020-12-16 | DRG: 268 | Disposition: E | Payer: Medicare Other | Attending: Pulmonary Disease | Admitting: Pulmonary Disease

## 2020-11-13 ENCOUNTER — Emergency Department: Payer: Medicare Other | Admitting: Registered Nurse

## 2020-11-13 ENCOUNTER — Emergency Department: Payer: Medicare Other

## 2020-11-13 ENCOUNTER — Encounter: Admission: EM | Disposition: E | Payer: Self-pay | Source: Home / Self Care | Attending: Vascular Surgery

## 2020-11-13 ENCOUNTER — Other Ambulatory Visit (INDEPENDENT_AMBULATORY_CARE_PROVIDER_SITE_OTHER): Payer: Self-pay | Admitting: Vascular Surgery

## 2020-11-13 ENCOUNTER — Encounter: Payer: Self-pay | Admitting: *Deleted

## 2020-11-13 ENCOUNTER — Other Ambulatory Visit: Payer: Self-pay

## 2020-11-13 ENCOUNTER — Telehealth: Payer: Self-pay

## 2020-11-13 DIAGNOSIS — K8020 Calculus of gallbladder without cholecystitis without obstruction: Secondary | ICD-10-CM | POA: Diagnosis not present

## 2020-11-13 DIAGNOSIS — E785 Hyperlipidemia, unspecified: Secondary | ICD-10-CM | POA: Diagnosis present

## 2020-11-13 DIAGNOSIS — Z885 Allergy status to narcotic agent status: Secondary | ICD-10-CM

## 2020-11-13 DIAGNOSIS — D631 Anemia in chronic kidney disease: Secondary | ICD-10-CM | POA: Diagnosis present

## 2020-11-13 DIAGNOSIS — K219 Gastro-esophageal reflux disease without esophagitis: Secondary | ICD-10-CM | POA: Diagnosis present

## 2020-11-13 DIAGNOSIS — I6612 Occlusion and stenosis of left anterior cerebral artery: Secondary | ICD-10-CM | POA: Diagnosis not present

## 2020-11-13 DIAGNOSIS — I468 Cardiac arrest due to other underlying condition: Secondary | ICD-10-CM | POA: Diagnosis not present

## 2020-11-13 DIAGNOSIS — I639 Cerebral infarction, unspecified: Secondary | ICD-10-CM | POA: Diagnosis not present

## 2020-11-13 DIAGNOSIS — M810 Age-related osteoporosis without current pathological fracture: Secondary | ICD-10-CM | POA: Diagnosis present

## 2020-11-13 DIAGNOSIS — G9340 Encephalopathy, unspecified: Secondary | ICD-10-CM

## 2020-11-13 DIAGNOSIS — G9389 Other specified disorders of brain: Secondary | ICD-10-CM | POA: Diagnosis present

## 2020-11-13 DIAGNOSIS — H3530 Unspecified macular degeneration: Secondary | ICD-10-CM | POA: Diagnosis present

## 2020-11-13 DIAGNOSIS — I959 Hypotension, unspecified: Secondary | ICD-10-CM | POA: Diagnosis not present

## 2020-11-13 DIAGNOSIS — Z880 Allergy status to penicillin: Secondary | ICD-10-CM

## 2020-11-13 DIAGNOSIS — S22050A Wedge compression fracture of T5-T6 vertebra, initial encounter for closed fracture: Secondary | ICD-10-CM | POA: Diagnosis not present

## 2020-11-13 DIAGNOSIS — I70213 Atherosclerosis of native arteries of extremities with intermittent claudication, bilateral legs: Secondary | ICD-10-CM | POA: Diagnosis not present

## 2020-11-13 DIAGNOSIS — I713 Abdominal aortic aneurysm, ruptured, unspecified: Secondary | ICD-10-CM | POA: Diagnosis present

## 2020-11-13 DIAGNOSIS — R0602 Shortness of breath: Secondary | ICD-10-CM | POA: Diagnosis not present

## 2020-11-13 DIAGNOSIS — I671 Cerebral aneurysm, nonruptured: Secondary | ICD-10-CM | POA: Diagnosis not present

## 2020-11-13 DIAGNOSIS — E872 Acidosis: Secondary | ICD-10-CM | POA: Diagnosis present

## 2020-11-13 DIAGNOSIS — I63549 Cerebral infarction due to unspecified occlusion or stenosis of unspecified cerebellar artery: Secondary | ICD-10-CM | POA: Diagnosis not present

## 2020-11-13 DIAGNOSIS — I714 Abdominal aortic aneurysm, without rupture, unspecified: Secondary | ICD-10-CM | POA: Diagnosis present

## 2020-11-13 DIAGNOSIS — S32030A Wedge compression fracture of third lumbar vertebra, initial encounter for closed fracture: Secondary | ICD-10-CM | POA: Diagnosis not present

## 2020-11-13 DIAGNOSIS — I4891 Unspecified atrial fibrillation: Secondary | ICD-10-CM | POA: Diagnosis present

## 2020-11-13 DIAGNOSIS — I4892 Unspecified atrial flutter: Secondary | ICD-10-CM | POA: Diagnosis not present

## 2020-11-13 DIAGNOSIS — I6522 Occlusion and stenosis of left carotid artery: Secondary | ICD-10-CM | POA: Diagnosis present

## 2020-11-13 DIAGNOSIS — K5730 Diverticulosis of large intestine without perforation or abscess without bleeding: Secondary | ICD-10-CM | POA: Diagnosis not present

## 2020-11-13 DIAGNOSIS — N2581 Secondary hyperparathyroidism of renal origin: Secondary | ICD-10-CM | POA: Diagnosis present

## 2020-11-13 DIAGNOSIS — I517 Cardiomegaly: Secondary | ICD-10-CM | POA: Diagnosis not present

## 2020-11-13 DIAGNOSIS — Z4682 Encounter for fitting and adjustment of non-vascular catheter: Secondary | ICD-10-CM | POA: Diagnosis not present

## 2020-11-13 DIAGNOSIS — I120 Hypertensive chronic kidney disease with stage 5 chronic kidney disease or end stage renal disease: Secondary | ICD-10-CM | POA: Diagnosis present

## 2020-11-13 DIAGNOSIS — I513 Intracardiac thrombosis, not elsewhere classified: Secondary | ICD-10-CM | POA: Diagnosis present

## 2020-11-13 DIAGNOSIS — E861 Hypovolemia: Secondary | ICD-10-CM | POA: Diagnosis present

## 2020-11-13 DIAGNOSIS — Z886 Allergy status to analgesic agent status: Secondary | ICD-10-CM

## 2020-11-13 DIAGNOSIS — I6621 Occlusion and stenosis of right posterior cerebral artery: Secondary | ICD-10-CM | POA: Diagnosis not present

## 2020-11-13 DIAGNOSIS — I723 Aneurysm of iliac artery: Secondary | ICD-10-CM | POA: Diagnosis present

## 2020-11-13 DIAGNOSIS — E871 Hypo-osmolality and hyponatremia: Secondary | ICD-10-CM | POA: Diagnosis not present

## 2020-11-13 DIAGNOSIS — K4090 Unilateral inguinal hernia, without obstruction or gangrene, not specified as recurrent: Secondary | ICD-10-CM | POA: Diagnosis not present

## 2020-11-13 DIAGNOSIS — G2581 Restless legs syndrome: Secondary | ICD-10-CM | POA: Diagnosis not present

## 2020-11-13 DIAGNOSIS — Z7901 Long term (current) use of anticoagulants: Secondary | ICD-10-CM

## 2020-11-13 DIAGNOSIS — Z882 Allergy status to sulfonamides status: Secondary | ICD-10-CM

## 2020-11-13 DIAGNOSIS — J90 Pleural effusion, not elsewhere classified: Secondary | ICD-10-CM | POA: Diagnosis not present

## 2020-11-13 DIAGNOSIS — S22080A Wedge compression fracture of T11-T12 vertebra, initial encounter for closed fracture: Secondary | ICD-10-CM | POA: Diagnosis not present

## 2020-11-13 DIAGNOSIS — I4901 Ventricular fibrillation: Secondary | ICD-10-CM | POA: Diagnosis not present

## 2020-11-13 DIAGNOSIS — D696 Thrombocytopenia, unspecified: Secondary | ICD-10-CM | POA: Diagnosis not present

## 2020-11-13 DIAGNOSIS — M3214 Glomerular disease in systemic lupus erythematosus: Secondary | ICD-10-CM | POA: Diagnosis present

## 2020-11-13 DIAGNOSIS — I9589 Other hypotension: Secondary | ICD-10-CM | POA: Diagnosis not present

## 2020-11-13 DIAGNOSIS — J9601 Acute respiratory failure with hypoxia: Secondary | ICD-10-CM | POA: Diagnosis not present

## 2020-11-13 DIAGNOSIS — G9341 Metabolic encephalopathy: Secondary | ICD-10-CM | POA: Diagnosis present

## 2020-11-13 DIAGNOSIS — J690 Pneumonitis due to inhalation of food and vomit: Secondary | ICD-10-CM | POA: Diagnosis not present

## 2020-11-13 DIAGNOSIS — N186 End stage renal disease: Secondary | ICD-10-CM | POA: Diagnosis not present

## 2020-11-13 DIAGNOSIS — R293 Abnormal posture: Secondary | ICD-10-CM | POA: Diagnosis not present

## 2020-11-13 DIAGNOSIS — G319 Degenerative disease of nervous system, unspecified: Secondary | ICD-10-CM | POA: Diagnosis not present

## 2020-11-13 DIAGNOSIS — S32020A Wedge compression fracture of second lumbar vertebra, initial encounter for closed fracture: Secondary | ICD-10-CM | POA: Diagnosis not present

## 2020-11-13 DIAGNOSIS — Z978 Presence of other specified devices: Secondary | ICD-10-CM

## 2020-11-13 DIAGNOSIS — Z20822 Contact with and (suspected) exposure to covid-19: Secondary | ICD-10-CM | POA: Diagnosis not present

## 2020-11-13 DIAGNOSIS — N390 Urinary tract infection, site not specified: Secondary | ICD-10-CM | POA: Diagnosis not present

## 2020-11-13 DIAGNOSIS — I469 Cardiac arrest, cause unspecified: Secondary | ICD-10-CM | POA: Diagnosis not present

## 2020-11-13 DIAGNOSIS — I725 Aneurysm of other precerebral arteries: Secondary | ICD-10-CM | POA: Diagnosis not present

## 2020-11-13 DIAGNOSIS — N280 Ischemia and infarction of kidney: Secondary | ICD-10-CM | POA: Diagnosis not present

## 2020-11-13 DIAGNOSIS — Z452 Encounter for adjustment and management of vascular access device: Secondary | ICD-10-CM | POA: Diagnosis not present

## 2020-11-13 DIAGNOSIS — N189 Chronic kidney disease, unspecified: Secondary | ICD-10-CM | POA: Diagnosis present

## 2020-11-13 DIAGNOSIS — I252 Old myocardial infarction: Secondary | ICD-10-CM

## 2020-11-13 DIAGNOSIS — I70229 Atherosclerosis of native arteries of extremities with rest pain, unspecified extremity: Secondary | ICD-10-CM

## 2020-11-13 DIAGNOSIS — R197 Diarrhea, unspecified: Secondary | ICD-10-CM | POA: Diagnosis not present

## 2020-11-13 DIAGNOSIS — R4182 Altered mental status, unspecified: Secondary | ICD-10-CM | POA: Diagnosis not present

## 2020-11-13 DIAGNOSIS — E875 Hyperkalemia: Secondary | ICD-10-CM | POA: Diagnosis not present

## 2020-11-13 DIAGNOSIS — M47814 Spondylosis without myelopathy or radiculopathy, thoracic region: Secondary | ICD-10-CM | POA: Diagnosis not present

## 2020-11-13 DIAGNOSIS — J9690 Respiratory failure, unspecified, unspecified whether with hypoxia or hypercapnia: Secondary | ICD-10-CM | POA: Diagnosis not present

## 2020-11-13 DIAGNOSIS — Z992 Dependence on renal dialysis: Secondary | ICD-10-CM | POA: Diagnosis not present

## 2020-11-13 DIAGNOSIS — I10 Essential (primary) hypertension: Secondary | ICD-10-CM | POA: Diagnosis not present

## 2020-11-13 DIAGNOSIS — M5136 Other intervertebral disc degeneration, lumbar region: Secondary | ICD-10-CM | POA: Diagnosis not present

## 2020-11-13 DIAGNOSIS — K661 Hemoperitoneum: Secondary | ICD-10-CM | POA: Diagnosis not present

## 2020-11-13 DIAGNOSIS — Z87891 Personal history of nicotine dependence: Secondary | ICD-10-CM

## 2020-11-13 DIAGNOSIS — E162 Hypoglycemia, unspecified: Secondary | ICD-10-CM | POA: Diagnosis present

## 2020-11-13 DIAGNOSIS — M4856XA Collapsed vertebra, not elsewhere classified, lumbar region, initial encounter for fracture: Secondary | ICD-10-CM | POA: Diagnosis present

## 2020-11-13 DIAGNOSIS — Z79899 Other long term (current) drug therapy: Secondary | ICD-10-CM

## 2020-11-13 DIAGNOSIS — M47816 Spondylosis without myelopathy or radiculopathy, lumbar region: Secondary | ICD-10-CM | POA: Diagnosis not present

## 2020-11-13 HISTORY — PX: ENDOVASCULAR REPAIR/STENT GRAFT: CATH118280

## 2020-11-13 LAB — COMPREHENSIVE METABOLIC PANEL
ALT: 10 U/L (ref 0–44)
AST: 17 U/L (ref 15–41)
Albumin: 3.1 g/dL — ABNORMAL LOW (ref 3.5–5.0)
Alkaline Phosphatase: 47 U/L (ref 38–126)
Anion gap: 11 (ref 5–15)
BUN: 28 mg/dL — ABNORMAL HIGH (ref 8–23)
CO2: 22 mmol/L (ref 22–32)
Calcium: 8.9 mg/dL (ref 8.9–10.3)
Chloride: 102 mmol/L (ref 98–111)
Creatinine, Ser: 3.96 mg/dL — ABNORMAL HIGH (ref 0.44–1.00)
GFR, Estimated: 11 mL/min — ABNORMAL LOW (ref >60)
Glucose, Bld: 78 mg/dL (ref 70–99)
Potassium: 4.3 mmol/L (ref 3.5–5.1)
Sodium: 135 mmol/L (ref 135–145)
Total Bilirubin: 1.3 mg/dL — ABNORMAL HIGH (ref 0.3–1.2)
Total Protein: 7.1 g/dL (ref 6.5–8.1)

## 2020-11-13 LAB — CBC
HCT: 35 % — ABNORMAL LOW (ref 36.0–46.0)
Hemoglobin: 11.5 g/dL — ABNORMAL LOW (ref 12.0–15.0)
MCH: 29.6 pg (ref 26.0–34.0)
MCHC: 32.9 g/dL (ref 30.0–36.0)
MCV: 90.2 fL (ref 80.0–100.0)
Platelets: 197 10*3/uL (ref 150–400)
RBC: 3.88 MIL/uL (ref 3.87–5.11)
RDW: 19.6 % — ABNORMAL HIGH (ref 11.5–15.5)
WBC: 5.4 10*3/uL (ref 4.0–10.5)
nRBC: 0 % (ref 0.0–0.2)

## 2020-11-13 LAB — CBC WITH DIFFERENTIAL/PLATELET
Abs Immature Granulocytes: 0.05 10*3/uL (ref 0.00–0.07)
Basophils Absolute: 0 10*3/uL (ref 0.0–0.1)
Basophils Relative: 1 %
Eosinophils Absolute: 0 10*3/uL (ref 0.0–0.5)
Eosinophils Relative: 1 %
HCT: 35.2 % — ABNORMAL LOW (ref 36.0–46.0)
Hemoglobin: 11.3 g/dL — ABNORMAL LOW (ref 12.0–15.0)
Immature Granulocytes: 1 %
Lymphocytes Relative: 13 %
Lymphs Abs: 1 10*3/uL (ref 0.7–4.0)
MCH: 28.7 pg (ref 26.0–34.0)
MCHC: 32.1 g/dL (ref 30.0–36.0)
MCV: 89.3 fL (ref 80.0–100.0)
Monocytes Absolute: 0.4 10*3/uL (ref 0.1–1.0)
Monocytes Relative: 5 %
Neutro Abs: 6 10*3/uL (ref 1.7–7.7)
Neutrophils Relative %: 79 %
Platelets: 152 10*3/uL (ref 150–400)
RBC: 3.94 MIL/uL (ref 3.87–5.11)
RDW: 19.7 % — ABNORMAL HIGH (ref 11.5–15.5)
WBC: 7.5 10*3/uL (ref 4.0–10.5)
nRBC: 0 % (ref 0.0–0.2)

## 2020-11-13 LAB — LIPID PANEL
Cholesterol: 198 mg/dL (ref 0–200)
HDL: 43 mg/dL (ref >40)
LDL Cholesterol: 131 mg/dL — ABNORMAL HIGH (ref 0–99)
Total CHOL/HDL Ratio: 4.6 RATIO
Triglycerides: 121 mg/dL (ref <150)
VLDL: 24 mg/dL (ref 0–40)

## 2020-11-13 LAB — MRSA NEXT GEN BY PCR, NASAL: MRSA by PCR Next Gen: NOT DETECTED

## 2020-11-13 LAB — SARS CORONAVIRUS 2 (TAT 6-24 HRS): SARS Coronavirus 2: NEGATIVE

## 2020-11-13 LAB — PROTIME-INR
INR: 1.2 (ref 0.8–1.2)
Prothrombin Time: 15.5 seconds — ABNORMAL HIGH (ref 11.4–15.2)

## 2020-11-13 LAB — MAGNESIUM: Magnesium: 1.8 mg/dL (ref 1.7–2.4)

## 2020-11-13 LAB — PHOSPHORUS: Phosphorus: 6.3 mg/dL — ABNORMAL HIGH (ref 2.5–4.6)

## 2020-11-13 LAB — LIPASE, BLOOD: Lipase: 27 U/L (ref 11–51)

## 2020-11-13 SURGERY — ENDOVASCULAR REPAIR/STENT GRAFT
Anesthesia: General

## 2020-11-13 MED ORDER — TIZANIDINE HCL 2 MG PO TABS: 2.0000 mg | ORAL_TABLET | Freq: Every day | ORAL | 0 refills | Status: AC | PRN

## 2020-11-13 MED ORDER — ONDANSETRON HCL 4 MG/2ML IJ SOLN
4.0000 mg | Freq: Once | INTRAMUSCULAR | Status: AC
  Administered 2020-11-13: 4 mg via INTRAVENOUS
  Filled 2020-11-13: qty 2

## 2020-11-13 MED ORDER — ONDANSETRON HCL 4 MG/2ML IJ SOLN
INTRAMUSCULAR | Status: AC
  Filled 2020-11-13: qty 2

## 2020-11-13 MED ORDER — CHLORHEXIDINE GLUCONATE CLOTH 2 % EX PADS: 6.0000 | MEDICATED_PAD | Freq: Once | CUTANEOUS | Status: DC

## 2020-11-13 MED ORDER — PROPOFOL 10 MG/ML IV BOLUS
INTRAVENOUS | Status: DC | PRN
  Administered 2020-11-13: 70 mg via INTRAVENOUS
  Administered 2020-11-13: 30 mg via INTRAVENOUS

## 2020-11-13 MED ORDER — FENTANYL CITRATE (PF) 100 MCG/2ML IJ SOLN
INTRAMUSCULAR | Status: AC
  Filled 2020-11-13: qty 2

## 2020-11-13 MED ORDER — SODIUM CHLORIDE 0.9 % IV SOLN
INTRAVENOUS | Status: DC | PRN
Start: 2020-11-13 — End: 2020-11-13

## 2020-11-13 MED ORDER — ACETAMINOPHEN 325 MG PO TABS
650.0000 mg | ORAL_TABLET | Freq: Four times a day (QID) | ORAL | Status: DC | PRN
Start: 2020-11-13 — End: 2020-11-13

## 2020-11-13 MED ORDER — MORPHINE SULFATE (PF) 2 MG/ML IV SOLN
2.0000 mg | INTRAVENOUS | Status: DC | PRN
  Administered 2020-11-13 (×2): 2 mg via INTRAVENOUS
  Administered 2020-11-13: 5 mg via INTRAVENOUS
  Administered 2020-11-13 – 2020-11-14 (×2): 4 mg via INTRAVENOUS
  Filled 2020-11-13 (×2): qty 1
  Filled 2020-11-13: qty 2
  Filled 2020-11-13: qty 3
  Filled 2020-11-13: qty 2

## 2020-11-13 MED ORDER — CHLORHEXIDINE GLUCONATE CLOTH 2 % EX PADS
6.0000 | MEDICATED_PAD | Freq: Every day | CUTANEOUS | Status: DC
  Administered 2020-11-15 – 2020-11-18 (×3): 6 via TOPICAL

## 2020-11-13 MED ORDER — ROPINIROLE HCL 0.25 MG PO TABS
0.5000 mg | ORAL_TABLET | Freq: Every morning | ORAL | Status: DC
  Administered 2020-11-14 – 2020-11-15 (×2): 0.5 mg via ORAL
  Filled 2020-11-13 (×7): qty 2

## 2020-11-13 MED ORDER — FENTANYL CITRATE (PF) 100 MCG/2ML IJ SOLN
INTRAMUSCULAR | Status: DC | PRN
  Administered 2020-11-13: 25 ug via INTRAVENOUS
  Administered 2020-11-13: 50 ug via INTRAVENOUS

## 2020-11-13 MED ORDER — ONDANSETRON HCL 4 MG/2ML IJ SOLN: 4.0000 mg | Freq: Four times a day (QID) | INTRAMUSCULAR | Status: DC | PRN

## 2020-11-13 MED ORDER — ACETAMINOPHEN 650 MG RE SUPP
650.0000 mg | Freq: Four times a day (QID) | RECTAL | Status: DC | PRN
  Filled 2020-11-13: qty 1

## 2020-11-13 MED ORDER — OXYCODONE-ACETAMINOPHEN 5-325 MG PO TABS
1.0000 | ORAL_TABLET | ORAL | Status: DC | PRN
  Administered 2020-11-13: 2 via ORAL
  Administered 2020-11-13: 1 via ORAL
  Administered 2020-11-14: 2 via ORAL
  Filled 2020-11-13 (×2): qty 2
  Filled 2020-11-13: qty 1

## 2020-11-13 MED ORDER — PHENOL 1.4 % MT LIQD
1.0000 | OROMUCOSAL | Status: DC | PRN
  Filled 2020-11-13: qty 177

## 2020-11-13 MED ORDER — MAGNESIUM SULFATE 2 GM/50ML IV SOLN
2.0000 g | Freq: Every day | INTRAVENOUS | Status: DC | PRN
Start: 2020-11-13 — End: 2020-11-20
  Filled 2020-11-13: qty 50

## 2020-11-13 MED ORDER — HYDRALAZINE HCL 20 MG/ML IJ SOLN: 5.0000 mg | INTRAMUSCULAR | Status: DC | PRN

## 2020-11-13 MED ORDER — ALUM & MAG HYDROXIDE-SIMETH 200-200-20 MG/5ML PO SUSP: 15.0000 mL | ORAL | Status: DC | PRN

## 2020-11-13 MED ORDER — MORPHINE SULFATE (PF) 2 MG/ML IV SOLN
2.0000 mg | INTRAVENOUS | Status: DC | PRN
  Administered 2020-11-13: 2 mg via INTRAVENOUS
  Filled 2020-11-13: qty 1

## 2020-11-13 MED ORDER — PHENYLEPHRINE HCL (PRESSORS) 10 MG/ML IV SOLN
INTRAVENOUS | Status: DC | PRN
  Administered 2020-11-13: 100 ug via INTRAVENOUS

## 2020-11-13 MED ORDER — GUAIFENESIN-DM 100-10 MG/5ML PO SYRP: 15.0000 mL | ORAL_SOLUTION | ORAL | Status: DC | PRN

## 2020-11-13 MED ORDER — LIDOCAINE HCL (CARDIAC) PF 100 MG/5ML IV SOSY
PREFILLED_SYRINGE | INTRAVENOUS | Status: DC | PRN
  Administered 2020-11-13: 40 mg via INTRAVENOUS

## 2020-11-13 MED ORDER — EPHEDRINE 5 MG/ML INJ
INTRAVENOUS | Status: AC
  Filled 2020-11-13: qty 5

## 2020-11-13 MED ORDER — SODIUM CHLORIDE 0.9 % IV SOLN: INTRAVENOUS | Status: DC

## 2020-11-13 MED ORDER — EMPTY CONTAINERS FLEXIBLE MISC
900.0000 mg | Freq: Once | Status: AC
  Administered 2020-11-13: 900 mg via INTRAVENOUS
  Filled 2020-11-13: qty 90

## 2020-11-13 MED ORDER — ROCURONIUM BROMIDE 100 MG/10ML IV SOLN
INTRAVENOUS | Status: DC | PRN
  Administered 2020-11-13: 10 mg via INTRAVENOUS
  Administered 2020-11-13: 50 mg via INTRAVENOUS

## 2020-11-13 MED ORDER — CARVEDILOL 25 MG PO TABS
25.0000 mg | ORAL_TABLET | Freq: Two times a day (BID) | ORAL | Status: DC
  Administered 2020-11-13 – 2020-11-14 (×3): 25 mg via ORAL
  Filled 2020-11-13 (×3): qty 2

## 2020-11-13 MED ORDER — TIZANIDINE HCL 2 MG PO TABS
2.0000 mg | ORAL_TABLET | Freq: Every day | ORAL | Status: DC | PRN
  Administered 2020-11-13: 2 mg via ORAL
  Filled 2020-11-13 (×2): qty 1

## 2020-11-13 MED ORDER — CHLORHEXIDINE GLUCONATE CLOTH 2 % EX PADS
6.0000 | MEDICATED_PAD | Freq: Once | CUTANEOUS | Status: DC
  Administered 2020-11-13: 6 via TOPICAL

## 2020-11-13 MED ORDER — FAMOTIDINE IN NACL 20-0.9 MG/50ML-% IV SOLN: 20.0000 mg | Freq: Two times a day (BID) | INTRAVENOUS | Status: DC

## 2020-11-13 MED ORDER — MAGNESIUM SULFATE 2 GM/50ML IV SOLN
2.0000 g | Freq: Once | INTRAVENOUS | Status: AC
  Administered 2020-11-13: 2 g via INTRAVENOUS
  Filled 2020-11-13: qty 50

## 2020-11-13 MED ORDER — ROPINIROLE HCL 0.25 MG PO TABS
0.5000 mg | ORAL_TABLET | ORAL | Status: AC
  Administered 2020-11-13: 0.5 mg via ORAL
  Filled 2020-11-13: qty 2

## 2020-11-13 MED ORDER — MIRTAZAPINE 7.5 MG PO TABS
7.5000 mg | ORAL_TABLET | Freq: Every day | ORAL | Status: DC
  Administered 2020-11-13: 7.5 mg via ORAL
  Filled 2020-11-13 (×2): qty 1

## 2020-11-13 MED ORDER — HEPARIN SODIUM (PORCINE) 1000 UNIT/ML IJ SOLN
INTRAMUSCULAR | Status: DC | PRN
  Administered 2020-11-13: 4000 [IU] via INTRAVENOUS
  Administered 2020-11-13: 2000 [IU] via INTRAVENOUS

## 2020-11-13 MED ORDER — SUGAMMADEX SODIUM 200 MG/2ML IV SOLN
INTRAVENOUS | Status: DC | PRN
  Administered 2020-11-13: 200 mg via INTRAVENOUS
  Administered 2020-11-13: 100 mg via INTRAVENOUS

## 2020-11-13 MED ORDER — NICARDIPINE HCL IN NACL 20-0.86 MG/200ML-% IV SOLN
3.0000 mg/h | INTRAVENOUS | Status: DC
Start: 2020-11-13 — End: 2020-11-13
  Administered 2020-11-13: 5 mg/h via INTRAVENOUS
  Filled 2020-11-13: qty 200

## 2020-11-13 MED ORDER — METOPROLOL TARTRATE 5 MG/5ML IV SOLN
2.0000 mg | INTRAVENOUS | Status: DC | PRN
  Administered 2020-11-14: 2 mg via INTRAVENOUS
  Filled 2020-11-13: qty 5

## 2020-11-13 MED ORDER — FENTANYL CITRATE PF 50 MCG/ML IJ SOSY: 25.0000 ug | PREFILLED_SYRINGE | INTRAMUSCULAR | Status: DC | PRN

## 2020-11-13 MED ORDER — DOPAMINE-DEXTROSE 3.2-5 MG/ML-% IV SOLN: 3.0000 ug/kg/min | INTRAVENOUS | Status: DC

## 2020-11-13 MED ORDER — DOCUSATE SODIUM 100 MG PO CAPS
100.0000 mg | ORAL_CAPSULE | Freq: Every day | ORAL | Status: DC
  Administered 2020-11-14 – 2020-11-19 (×3): 100 mg via ORAL
  Filled 2020-11-13 (×2): qty 1

## 2020-11-13 MED ORDER — LABETALOL HCL 5 MG/ML IV SOLN
10.0000 mg | INTRAVENOUS | Status: DC | PRN
  Administered 2020-11-13 (×2): 10 mg via INTRAVENOUS
  Filled 2020-11-13: qty 4

## 2020-11-13 MED ORDER — ACETAMINOPHEN 325 MG PO TABS
325.0000 mg | ORAL_TABLET | ORAL | Status: DC | PRN
  Filled 2020-11-13: qty 2

## 2020-11-13 MED ORDER — CLINDAMYCIN PHOSPHATE 300 MG/50ML IV SOLN
300.0000 mg | Freq: Two times a day (BID) | INTRAVENOUS | Status: AC
Start: 2020-11-14 — End: 2020-11-14
  Administered 2020-11-14: 300 mg via INTRAVENOUS
  Administered 2020-11-14: 600 mg via INTRAVENOUS
  Filled 2020-11-13 (×2): qty 50

## 2020-11-13 MED ORDER — POTASSIUM CHLORIDE CRYS ER 20 MEQ PO TBCR: 20.0000 meq | EXTENDED_RELEASE_TABLET | Freq: Every day | ORAL | Status: DC | PRN

## 2020-11-13 MED ORDER — ROPINIROLE HCL 0.25 MG PO TABS: 0.5000 mg | ORAL_TABLET | ORAL | Status: DC

## 2020-11-13 MED ORDER — ONDANSETRON HCL 4 MG/2ML IJ SOLN: 4.0000 mg | Freq: Once | INTRAMUSCULAR | Status: DC | PRN

## 2020-11-13 MED ORDER — FAMOTIDINE 20 MG IN NS 100 ML IVPB
20.0000 mg | Freq: Two times a day (BID) | INTRAVENOUS | Status: DC
  Administered 2020-11-13: 20 mg via INTRAVENOUS
  Filled 2020-11-13 (×3): qty 100

## 2020-11-13 MED ORDER — EPHEDRINE SULFATE 50 MG/ML IJ SOLN
INTRAMUSCULAR | Status: DC | PRN
  Administered 2020-11-13: 10 mg via INTRAVENOUS

## 2020-11-13 MED ORDER — CLINDAMYCIN PHOSPHATE 300 MG/50ML IV SOLN
300.0000 mg | INTRAVENOUS | Status: AC
  Administered 2020-11-13: 300 mg via INTRAVENOUS

## 2020-11-13 MED ORDER — SODIUM CHLORIDE 0.9 % IV SOLN
INTRAVENOUS | Status: DC | PRN
  Administered 2020-11-13: 20 ug/min via INTRAVENOUS

## 2020-11-13 MED ORDER — ROPINIROLE HCL 1 MG PO TABS
1.0000 mg | ORAL_TABLET | Freq: Every day | ORAL | Status: DC
  Administered 2020-11-13 – 2020-11-14 (×2): 1 mg via ORAL
  Filled 2020-11-13 (×3): qty 1

## 2020-11-13 MED ORDER — IODIXANOL 320 MG/ML IV SOLN
INTRAVENOUS | Status: DC | PRN
  Administered 2020-11-13: 95 mL via INTRA_ARTERIAL

## 2020-11-13 MED ORDER — ACETAMINOPHEN 650 MG RE SUPP: 325.0000 mg | RECTAL | Status: DC | PRN

## 2020-11-13 MED ORDER — CLINDAMYCIN PHOSPHATE 300 MG/50ML IV SOLN
INTRAVENOUS | Status: AC
  Filled 2020-11-13: qty 50

## 2020-11-13 MED ORDER — NITROGLYCERIN IN D5W 200-5 MCG/ML-% IV SOLN: 5.0000 ug/min | INTRAVENOUS | Status: DC

## 2020-11-13 MED ORDER — ONDANSETRON HCL 4 MG PO TABS: 4.0000 mg | ORAL_TABLET | Freq: Four times a day (QID) | ORAL | Status: DC | PRN

## 2020-11-13 SURGICAL SUPPLY — 36 items
CATH ACCU-VU SIZ PIG 5F 70CM (CATHETERS) ×2 IMPLANT
CATH BALLN CODA 9X100X32 (BALLOONS) ×2 IMPLANT
CATH BEACON 5 .035 65 KMP TIP (CATHETERS) ×2 IMPLANT
CATH KUMPE SOFT-VU 5FR 65 (CATHETERS) ×2 IMPLANT
CATH MICROCATH PRGRT 2.8F 110 (CATHETERS) ×1 IMPLANT
CATH TEMPO 5F RIM 65CM (CATHETERS) ×2 IMPLANT
CATH VS15FR (CATHETERS) ×2 IMPLANT
CLOSURE PERCLOSE PROSTYLE (VASCULAR PRODUCTS) ×8 IMPLANT
COIL 400 COMPLEX SOFT 20X60CM (Vascular Products) ×4 IMPLANT
COIL 400 COMPLEX STD 20X60CM (Vascular Products) ×2 IMPLANT
COIL 400 COMPLEX STD 24X57CM (Vascular Products) ×4 IMPLANT
COIL 400 COMPLEX STD 28X60CM (Vascular Products) ×4 IMPLANT
DEVICE SAFEGUARD 24CM (GAUZE/BANDAGES/DRESSINGS) ×4 IMPLANT
DEVICE TORQUE .025-.038 (MISCELLANEOUS) ×2 IMPLANT
DRYSEAL FLEXSHEATH 12FR 33CM (SHEATH) ×1
DRYSEAL FLEXSHEATH 18FR 33CM (SHEATH) ×1
EXCLDR TRNK 28.5X14.5X12 16F (Endovascular Graft) ×2 IMPLANT
EXCLUDER TNK 28.5X14.5X12 16F (Endovascular Graft) ×1 IMPLANT
GLIDEWIRE STIFF .35X180X3 HYDR (WIRE) ×2 IMPLANT
HANDLE DETACHMENT COIL (MISCELLANEOUS) ×2 IMPLANT
KIT MICROPUNCTURE NIT STIFF (SHEATH) ×2 IMPLANT
LEG CONTRALATERAL 16X12X14 (Vascular Products) ×1 IMPLANT
LEG CONTRALATERAL 16X20X13.5 (Vascular Products) ×1 IMPLANT
MICROCATH PROGREAT 2.8F 110 CM (CATHETERS) ×2
NEEDLE ENTRY 21GA 7CM ECHOTIP (NEEDLE) ×2 IMPLANT
PACK ANGIOGRAPHY (CUSTOM PROCEDURE TRAY) ×2 IMPLANT
SHEATH BRITE TIP 6FRX11 (SHEATH) ×4 IMPLANT
SHEATH BRITE TIP 8FRX11 (SHEATH) ×4 IMPLANT
SHEATH DRYSEAL FLEX 12FR 33CM (SHEATH) ×1 IMPLANT
SHEATH DRYSEAL FLEX 18FR 33CM (SHEATH) ×1 IMPLANT
SPONGE XRAY 4X4 16PLY STRL (MISCELLANEOUS) ×4 IMPLANT
STENT GRAFT CONTRALAT 16X12X14 (Vascular Products) ×1 IMPLANT
STENT GRAFT CONTRALAT 20X13.5 (Vascular Products) ×1 IMPLANT
TUBING CONTRAST HIGH PRESS 72 (TUBING) ×2 IMPLANT
WIRE AMPLATZ SSTIFF .035X260CM (WIRE) ×4 IMPLANT
WIRE GUIDERIGHT .035X150 (WIRE) ×4 IMPLANT

## 2020-11-13 NOTE — Transfer of Care (Signed)
Immediate Anesthesia Transfer of Care Note  Patient: Marissa Hess  Procedure(s) Performed: ENDOVASCULAR REPAIR/STENT GRAFT  Patient Location: PACU  Anesthesia Type:General  Level of Consciousness: awake and alert   Airway & Oxygen Therapy: Patient Spontanous Breathing and Patient connected to face mask oxygen  Post-op Assessment: Report given to RN and Post -op Vital signs reviewed and stable  Post vital signs: Reviewed and stable  Last Vitals:  Vitals Value Taken Time  BP 121/103 10/18/2020 1400  Temp    Pulse 61 11/15/2020 1410  Resp 13 11/03/2020 1410  SpO2 93 % 10/23/2020 1410  Vitals shown include unvalidated device data.  Last Pain:  Vitals:   11/06/2020 1047  TempSrc:   PainSc: 4          Complications: No notable events documented.

## 2020-11-13 NOTE — Telephone Encounter (Signed)
Called and left a message asking patient to return my call.  

## 2020-11-13 NOTE — Progress Notes (Signed)
[facility], [place] 11/02/2020  Subjective:   LOS: 0  Patient presented for Lower abdominal pain Reported diarrhea and vomiting to ER staff HD on Mon-Fri Dx with AAA with intramural thrombus Also suspicion for aneurysmal leak Underwent endovascular repair / stent graft placement Seen post op Feeling cold therefore a warming blanklet is in place     Objective:  Vital signs in last 24 hours:  Temp:  [96.6 F (35.9 C)-97.9 F (36.6 C)] 97.3 F (36.3 C) (08/29 1700) Pulse Rate:  [47-99] 81 (08/29 1845) Resp:  [14-33] 20 (08/29 1845) BP: (121-187)/(73-133) 175/90 (08/29 1845) SpO2:  [86 %-100 %] 93 % (08/29 1845) Weight:  [58.2 kg] 58.2 kg (08/29 1500)  Weight change:  Filed Weights   11/08/2020 1500  Weight: 58.2 kg    Intake/Output:    Intake/Output Summary (Last 24 hours) at 10/29/2020 1914 Last data filed at 11/06/2020 1800 Gross per 24 hour  Intake 158.67 ml  Output 295 ml  Net -136.33 ml    Physical Exam: General:  No acute distress, laying in the bed  HEENT  anicteric, moist oral mucous membrane  Pulm/lungs  normal breathing effort, [place] O2  CVS/Heart  regular rhythm, no rub or gallop  Abdomen:   Soft, nontender  Extremities:  No peripheral edema  Neurologic:  Alert, able to follow commands  Skin:  No acute rashes  Rt arm AVF    Basic Metabolic Panel:  Recent Labs  Lab 10/18/2020 0615 11/06/2020 1537  NA 135  --   K 4.3  --   CL 102  --   CO2 22  --   GLUCOSE 78  --   BUN 28*  --   CREATININE 3.96*  --   CALCIUM 8.9  --   MG  --  1.8  PHOS  --  6.3*     CBC: Recent Labs  Lab 10/28/2020 0615 11/05/2020 1537  WBC 5.4 7.5  NEUTROABS  --  6.0  HGB 11.5* 11.3*  HCT 35.0* 35.2*  MCV 90.2 89.3  PLT 197 152     No results found for: HEPBSAG, HEPBSAB, HEPBIGM    Microbiology:  Recent Results (from the past 240 hour(s))  SARS CORONAVIRUS 2 (TAT 6-24 HRS) Nasopharyngeal Nasopharyngeal Swab     Status: None    Collection Time: 11/10/2020  9:46 AM   Specimen: Nasopharyngeal Swab  Result Value Ref Range Status   SARS Coronavirus 2 NEGATIVE NEGATIVE Final    Comment: (NOTE) SARS-CoV-2 target nucleic acids are NOT DETECTED.  The SARS-CoV-2 RNA is generally detectable in upper and lower respiratory specimens during the acute phase of infection. Negative results do not preclude SARS-CoV-2 infection, do not rule out co-infections with other pathogens, and should not be used as the sole basis for treatment or other patient management decisions. Negative results must be combined with clinical observations, patient history, and epidemiological information. The expected result is Negative.  Fact Sheet for Patients: SugarRoll.be  Fact Sheet for Healthcare Providers: https://www.woods-mathews.com/  This test is not yet approved or cleared by the [place] FDA and  has been authorized for detection and/or diagnosis of SARS-CoV-2 by FDA under an Emergency Use Authorization (EUA). This EUA will remain  in effect (meaning this test can be used) for the duration of the COVID-19 declaration under Se ction 564(b)(1) of the Act, 21 U.S.C. section 360bbb-3(b)(1), unless the authorization is terminated or revoked sooner.  Performed at [facility], [place] [address]  [place], [place] [address]     Coagulation Studies: Recent Labs    10/30/2020 0946  LABPROT 15.5*  INR 1.2    Urinalysis: No results for input(s): COLORURINE, LABSPEC, PHURINE, GLUCOSEU, HGBUR, BILIRUBINUR, KETONESUR, PROTEINUR, UROBILINOGEN, NITRITE, LEUKOCYTESUR in the last 72 hours.  Invalid input(s): APPERANCEUR    Imaging: CT ABDOMEN PELVIS WO CONTRAST  Result Date: 10/21/2020 CLINICAL DATA:  Lower back and abdominal pain for 2 weeks, diarrhea since Saturday, vomiting since yesterday, suspected diverticulitis, history end-stage renal disease on dialysis EXAM: CT ABDOMEN AND PELVIS  WITHOUT CONTRAST TECHNIQUE: Multidetector CT imaging of the abdomen and pelvis was performed following the standard protocol without IV contrast. Sagittal and coronal MPR images reconstructed from axial data set. No oral contrast administered. COMPARISON:  None FINDINGS: Lower chest: Subsegmental atelectasis LEFT lower lobe. Tiny RIGHT pleural effusion. Hepatobiliary: Small calcified gallstones dependently in gallbladder. Liver unremarkable. Pancreas: Normal appearance Spleen: Normal appearance Adrenals/Urinary Tract: Atrophic kidneys. Few tiny renal cysts. No urinary tract calcification or dilatation. Bladder unremarkable. Stomach/Bowel: Diverticulosis of descending and sigmoid colon without evidence of diverticulitis. Appendix not visualized. Stomach and bowel loops otherwise unremarkable for technique. Vascular/Lymphatic: Extensive atherosclerotic calcifications aorta, iliac arteries, minimally femoral arteries. Tortuous abdominal aorta. Fusiform aneurysmal dilatation of mid to distal abdominal aorta measuring 7.3 x 6.3 cm in greatest axial dimensions and extending 6.4 cm length. High attenuation intramural hematoma identified. Perianeurysmal fat planes demonstrate minimal infiltration highly suspicious for aneurysmal leak. Aneurysmal dilatation extends into the aortic bifurcation with dilatation of the common iliac arteries bilaterally 20 mm RIGHT and 17 mm LEFT. Aneurysmal dilatation of RIGHT internal iliac artery 26 mm diameter. No adenopathy. Reproductive: Uterus surgically absent.  Ovaries not visualized. Other: Small amount of free fluid in pelvis and perisplenic. No free air. LEFT inguinal hernia containing fat. Prior ventral hernia repair. Nonspecific stranding of presacral fat and scattered subcutaneous stranding, question related to patient's end-stage renal disease, missed dialysis this morning. Musculoskeletal: Osseous demineralization. IMPRESSION: Fusiform aneurysmal dilatation of the mid to distal  abdominal aorta measuring 7.3 x 6.3 cm in greatest axial dimensions and extending 6.4 cm length, associated with high attenuation acute intramural hematoma and minimal infiltration of fat planes highly suspicious for aneurysmal leak. These findings were emergently called to Dr. Quentin Cornwall on 10/28/2020 at 0920 hours, prior to dictation of this report. Aneurysmal dilatation of common iliac arteries bilaterally, and RIGHT internal iliac artery to 26 mm diameter, without evidence of hemorrhage/leak. Distal colonic diverticulosis without evidence of diverticulitis. Cholelithiasis. Small amount of nonspecific free intraperitoneal fluid. LEFT inguinal hernia containing fat. Aortic Atherosclerosis (ICD10-I70.0). Critical Value/emergent results were called by telephone at the time of interpretation on 10/27/2020 at 9:30 am to provider Merlyn Lot , who verbally acknowledged these results. Electronically Signed   By: Lavonia Dana M.D.   On: 10/29/2020 09:31   PERIPHERAL VASCULAR CATHETERIZATION  Result Date: 10/24/2020 See surgical note for result.    Medications:    sodium chloride Stopped (11/02/2020 1728)   [START ON 11/14/2020] clindamycin (CLEOCIN) IV     DOPamine     magnesium sulfate bolus IVPB     nitroGLYCERIN      carvedilol  25 mg Oral BID WC   [START ON 11/14/2020] docusate sodium  100 mg Oral Daily   famotidine  20 mg Intravenous BID   mirtazapine  7.5 mg Oral QHS   [START ON 11/14/2020] rOPINIRole  0.5 mg Oral q AM   rOPINIRole  0.5 mg Oral STAT   rOPINIRole  1 mg  Oral QHS   acetaminophen **OR** acetaminophen, alum & mag hydroxide-simeth, guaiFENesin-dextromethorphan, hydrALAZINE, labetalol, magnesium sulfate bolus IVPB, metoprolol tartrate, morphine injection, ondansetron, oxyCODONE-acetaminophen, phenol, potassium chloride, tiZANidine  Assessment/ Plan:  86 y.o. female with ESRD, h/o lupus nephritis, A flutter, GERD, was admitted on 10/16/2020 for  Active Problems:   Ruptured abdominal  aortic aneurysm (AAA) (HCC)   AAA (abdominal aortic aneurysm) (HCC)   Aneurysm, abdominal aortic, with rupture (HCC)  Abdominal aortic aneurysm (AAA) without rupture (HCC) [I71.4] Ruptured abdominal aortic aneurysm (AAA) (HCC) [I71.3] AAA (abdominal aortic aneurysm) (HCC) [I71.4] Aneurysm, abdominal aortic, with rupture ([facility]) [I71.3]  #. ESRD Plan for HD tomorrow and then keep on outpatient schedule  #. Anemia of CKD  Lab Results  Component Value Date   HGB 11.3 (L) 10/22/2020   Low dose EPO with HD for Hgb < 10  #. Secondary hyperparathyroidism of renal origin N 25.81   No results found for: PTH Lab Results  Component Value Date   PHOS 6.3 (H) 11/08/2020   Monitor calcium and phos level during this admission   #.  AAA, contained rupture S/p endovascular repair on 12/14/20   LOS: 0 Meleah Demeyer 8/29/20227:14 PM  [facility] [place], Maish Vaya

## 2020-11-13 NOTE — ED Triage Notes (Addendum)
Lower back pain/lower abdominal pain for about two weeks. Diarrhea since Saturday, vomiting since yesterday. Denies fevers. Pt is M/F dialysis, missed her dialysis this morning.

## 2020-11-13 NOTE — ED Notes (Signed)
Pricket pt twice

## 2020-11-13 NOTE — Interval H&P Note (Signed)
History and Physical Interval Note:  11/12/2020 11:41 AM  Marissa Hess  has presented today for surgery, with the diagnosis of AAA.  The various methods of treatment have been discussed with the patient and family. After consideration of risks, benefits and other options for treatment, the patient has consented to  Procedure(s): ENDOVASCULAR REPAIR/STENT GRAFT (N/A) as a surgical intervention.  The patient's history has been reviewed, patient examined, no change in status, stable for surgery.  I have reviewed the patient's chart and labs.  Questions were answered to the patient's satisfaction.     Hortencia Pilar

## 2020-11-13 NOTE — Anesthesia Procedure Notes (Signed)
Procedure Name: Intubation Date/Time: 10/26/2020 11:30 AM Performed by: Hedda Slade, CRNA Pre-anesthesia Checklist: Patient identified, Patient being monitored, Timeout performed, Emergency Drugs available and Suction available Patient Re-evaluated:Patient Re-evaluated prior to induction Oxygen Delivery Method: Circle system utilized Preoxygenation: Pre-oxygenation with 100% oxygen Induction Type: IV induction Ventilation: Mask ventilation without difficulty Laryngoscope Size: 3 and McGraph Grade View: Grade I Tube type: Oral Tube size: 7.0 mm Number of attempts: 1 Airway Equipment and Method: Stylet and Video-laryngoscopy Placement Confirmation: ETT inserted through vocal cords under direct vision, positive ETCO2 and breath sounds checked- equal and bilateral Secured at: 21 cm Tube secured with: Tape Dental Injury: Teeth and Oropharynx as per pre-operative assessment

## 2020-11-13 NOTE — Op Note (Addendum)
OPERATIVE NOTE   PROCEDURE: US guidance for vascular access, bilateral femoral arteries Catheter placement into aorta from bilateral femoral approaches and into the SMA and the right hypogastric artery from the left femoral approach Coil embolization of the right hypogastric artery with a total of 6 Ruby coils Selective angiography of the right internal iliac artery and the right superior mesenteric artery Placement of a 28 mm proximal conformable C3 Gore Excluder Endoprosthesis main body right with a 20 mm diameter by 14 cm length left contralateral limb. This was placed above the left renal artery and just below the right renal artery to treat the juxtarenal aneurysm Placement of a 12 mm diameter by 14 cm length right iliac extension limb down into the right external iliac artery distal to the right hypogastric artery which had been coil embolized for aneurysm ProGlide closure devices bilateral femoral arteries  PRE-OPERATIVE DIAGNOSIS: AAA, juxtarenal with contained rupture, ESRD  POST-OPERATIVE DIAGNOSIS: same  SURGEON: Marissa Pain, MD and Marissa Pilar, MD - Co-surgeons  ANESTHESIA: General  ESTIMATED BLOOD LOSS: 50 cc  FINDING(S): 1.  AAA, right hypogastric artery aneurysm  SPECIMEN(S):  none  INDICATIONS:   Marissa Hess is a 86 y.o. female who presents with contained rupture of a juxtarenal abdominal aortic aneurysm in a patient with longstanding end-stage renal disease.  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 The anatomy was suitable for endovascular repair with coverage of the left renal artery which in a dialysis patient is an option and this would also require concomitant coil embolization of the right hypogastric artery as well.  Risks and benefits of repair in an endovascular fashion were discussed and informed consent was obtained. Co-surgeons are used to expedite the procedure and reduce operative time as bilateral work  needs to be done.  DESCRIPTION: After obtaining full informed written consent, the patient was brought back to the operating room and placed supine upon the operating table.  The patient received IV antibiotics prior to induction.  After obtaining adequate anesthesia, the patient was prepped and draped in the standard fashion for endovascular AAA repair.  We then began by gaining access to both femoral arteries with US guidance with me working on the right and Dr. Delana Meyer working on the left.  The femoral arteries were found to be patent and accessed without difficulty with a needle under ultrasound guidance without difficulty on each side and permanent images were recorded.  We then placed 2 proglide devices on each side in a pre-close fashion and placed 8 French sheaths. The patient was then given 4000 units of intravenous heparin.  We began by Dr. Delana Meyer going up and over the aortic bifurcation with a rim catheter and a Glidewire and selectively cannulating the right hypogastric artery.  Selective imaging showed a sizable aneurysm of the right hypogastric artery.  He then advanced a prograde microcatheter and deployed a series of Ruby coils into the right hypogastric artery to completely embolize this.  Prograde catheter in the rim catheter were then removed from the right hypogastric artery and we proceeded with aneurysm repair.  The Pigtail catheter was placed into the aorta from the left side. Using this image, we selected a 28 mm proximal conformable C3 Main body device.  Over a stiff wire, an 29 French sheath was placed up the right. The main body was then placed through the 18 French sheath. A Kumpe catheter was placed up the left side and a magnified  image at the renal arteries was performed.  Different obliques were used and it was difficult to opacify the origins.  Mesenteric artery which was the key to avoiding coverage in this juxtarenal aneurysm.  Dr. Delana Meyer selectively cannulated superior  mesenteric artery with a V S1 catheter and perform selective imaging in both a lateral and AP projection to show its orientation.  The main body was then deployed just below the right renal artery and just below the superior mesenteric artery, but above the left renal artery. The Kumpe catheter was used to cannulate the contralateral gate without difficulty and successful cannulation was confirmed by twirling the pigtail catheter in the main body. We then placed a stiff wire and a retrograde arteriogram was performed through the left femoral sheath. We upsized to the 12 [place] sheath for the contralateral limb and a 20 mm diameter by 14 cm length left iliac limb was selected and deployed. The main body deployment was then completed. Based off the angiographic findings, extension limbs were necessary to get down beyond the aneurysmal portion of the right common iliac artery and the right hypogastric artery aneurysm and into the right external iliac artery.  A 12 mm diameter by 14 cm length right iliac extension limb was taken about 3 to 4 cm down into the right external iliac artery to ensure seal. All junction points and seals zones were treated with the compliant balloon. The pigtail catheter was then replaced and a completion angiogram was performed.  No endoleak was detected on completion angiography. The right renal artery were found to be widely patent.  The SMA was patent.  The left renal artery had been covered by the stent graft but this was planned.  The right hypogastric artery was successfully coil embolized and had no flow.  The left hypogastric artery remained widely patent. At this point we elected to terminate the procedure. We secured the pro glide devices for hemostasis on the femoral arteries. The skin incision was closed with a 4-0 Monocryl. Dermabond and pressure dressing were placed. The patient was taken to the recovery room in stable condition having tolerated the procedure  well.  COMPLICATIONS: none  CONDITION: stable  Marissa Hess  11/06/2020, 1:26 PM   This note was created with Dragon Medical transcription system. Any errors in dictation are purely unintentional.

## 2020-11-13 NOTE — Progress Notes (Signed)
No pedal pulses noted bilaterally. Feet are cold to the touch. Cap refill greater than 3 seconds. Pt states legs are heavy. Pt does not have pain at this time. Dr. Lucky Cowboy notified. Acknowledged. No new orders. States will follow.

## 2020-11-13 NOTE — Op Note (Signed)
OPERATIVE NOTE   PROCEDURE: US guidance for vascular access, bilateral femoral arteries Catheter placement into aorta from bilateral femoral approaches with first-order catheter placement into the right internal iliac artery from left femoral approach and first-order catheter placement into the superior mesenteric artery left femoral approach. Coil embolization right internal iliac artery aneurysm using 7 Ruby coils Selective angiography of the right internal iliac artery and the superior mesenteric artery. Placement of a 28 x 14 x 12 conformable C3 Gore Excluder Endoprosthesis main body with a 20 x 14 contralateral limb. Placement of a 12 x 14 right iliac extender limb into the right external iliac artery distal to the internal iliac artery aneurysm ProGlide closure devices bilateral femoral arteries  PRE-OPERATIVE DIAGNOSIS: AAA  POST-OPERATIVE DIAGNOSIS: same  SURGEON: Hortencia Pilar, MD and Leotis Pain, MD - Co-surgeons  ANESTHESIA: general  ESTIMATED BLOOD LOSS: 50 cc  FINDING(S): 1.  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 y.o. female who presents with a ruptured juxtarenal abdominal aortic aneurysm.  She has multiple comorbidities.  CT scan was obtained which was without contrast and therefore did not provide a complete picture of the visceral segment of the aorta.  She would be a very poor operative candidate in the nonemergent setting and given her contained rupture stent graft placement was really the only option.  Given this and the gravity of situation we elected to proceed straight to surgery without further imaging.  The risks and benefits for stent graft placement  were reviewed all questions been answered patient has agreed to proceed.  DESCRIPTION: After obtaining full informed written consent, the patient was brought back to the operating room and placed supine upon the operating table.  The patient received IV antibiotics prior to induction.  After obtaining adequate anesthesia, the patient was prepped and draped in the standard fashion for endovascular AAA repair.  Co-surgeons are required because this is a complex bilateral procedure with work being performed simultaneously from both the right femoral and left femoral approach.  This also expedites the procedure making a shorter operative time reducing complications and improving patient safety.  We then began by gaining access to both femoral arteries with US guidance with me working on the the patient's left and Dr. Lucky Cowboy working on the patient's right.  The femoral arteries were found to be patent and accessed without difficulty with a needle under ultrasound guidance without difficulty on each side and permanent images were recorded.  We then placed 2 proglide devices on each side in a pre-close fashion and placed 8 French sheaths.  The patient was then given 4000 units of intravenous heparin.  Later in the case an additional 2000 units of heparin was given.  Initially with Dr. Bunnie Domino assistance holding tension and helping me torque the catheter I was able to cross the aortic bifurcation using a rim catheter.  The stiff Glidewire and rim catheter were then negotiated down into the distal common iliac where I was able to advance the Glidewire into the internal iliac and then we were able to advance the catheter.  Hand-injection of contrast demonstrated the right internal iliac artery aneurysm with its outflow.  Glidewire was then removed and a  prograde catheter was advanced.  Once again considering the tortuosity and stored energy in the catheters with Dr. Bunnie Domino assistance we were able to place a total of 7 Ruby  coils the first 4 were 28 mm x 60 cm standard coils followed by 324 x 60 soft coils.  Follow-up imaging demonstrated successful coil embolization of the entire aneurysm and we then elected to proceed forward with abdominal aortic aneurysm repair.  The rim catheter was pulled back into the aortic aneurysm exchanged for a Kumpe catheter and the stiff angled Glidewire negotiated into the descending thoracic aorta.  Kumpe was removed and the pigtail catheter was advanced.  In similar fashion Dr. Lucky Cowboy negotiated his wire into the thoracic aorta.  The Pigtail catheter was placed into the aorta from the left side.  AP projection was initially obtained and this was used to verify our choice of main body.  Using this image, we selected a 28 x 14 x 12 Main body device.  However given the juxtarenal nature of her aneurysm with a saccular type anatomy right at the left renal and in association with the fact that she is on hemodialysis and has been for years we required placing the stent graft above the renal.  In order to achieve an adequate proximal neck it was necessary to select the superior mesenteric artery.  In order to accomplish this I advanced a stiff angled Glidewire through the pigtail catheter and exchanged the catheter for a V S1 catheter.  With Dr. Lucky Cowboy assisting holding tension on the wire and stabilizing the catheter the V S1 catheter was then used to select the superior mesenteric artery hand-injection of contrast demonstrated the anatomy of the superior mesenteric artery and the origin of the artery.  With the catheter hooked on the origin we proceeded with advancing the main body.  In order to accomplish this an Amplatz stiff wire was advanced under fluoroscopic guidance and an 41 French sheath was placed from the right groin. The main body was then placed through the 18 French sheath.  At this point using our SMA as our guide with the intention of covering both renal arteries the main body was positioned  and then deployed opening the contralateral gate in the usual fashion.  The V S1 catheter was then removed over a Glidewire and the Glidewire pulled back into the distal aneurysm sac.    The VS 1 catheter and a stiff angled Glidewire was used to cannulate the contralateral gate without difficulty and successful cannulation was confirmed by twirling the pigtail catheter in the main body. We then placed a stiff wire and a retrograde arteriogram was performed through the left femoral sheath. We upsized to the 12 [place] sheath for the contralateral limb and a 20 x 14 limb was selected and deployed. The main body deployment was then completed. Based off the angiographic findings, extension limbs were necessary.  A 12 x 14 right iliac extension was required to complete the right side extending into the right external iliac artery in order to complete the exclusion of the right internal iliac artery aneurysm. All junction points and seals zones were treated with the compliant balloon.   The pigtail catheter was then replaced and a completion angiogram was performed.   No endoleak was detected on completion angiography. The SMA and celiac were found to be widely patent.  No retrograde filling of the right internal iliac was noted.   At this point we elected to terminate the procedure. We secured  the pro glide devices for hemostasis on the femoral arteries. The skin incision was closed with a 4-0 Monocryl. Dermabond and pressure dressing were placed. The patient was taken to the recovery room in stable condition having tolerated the procedure well.  COMPLICATIONS: none  CONDITION: stable  Hortencia Pilar  10/28/2020, 1:48 PM

## 2020-11-13 NOTE — Anesthesia Preprocedure Evaluation (Signed)
Anesthesia Evaluation  Patient identified by MRN, date of birth, ID band Patient awake    Reviewed: Allergy & Precautions, H&P , NPO status , Patient's Chart, lab work & pertinent test results, reviewed documented beta blocker date and time   Airway Mallampati: II  TM Distance: >3 FB Neck ROM: full    Dental  (+) Upper Dentures, Lower Dentures   Pulmonary neg pulmonary ROS, former smoker,    Pulmonary exam normal        Cardiovascular Exercise Tolerance: Poor hypertension, On Medications + Past MI  Normal cardiovascular exam Rhythm:regular Rate:Normal     Neuro/Psych negative neurological ROS  negative psych ROS   GI/Hepatic Neg liver ROS, GERD  Medicated,  Endo/Other  negative endocrine ROS  Renal/GU Renal disease  negative genitourinary   Musculoskeletal   Abdominal   Peds  Hematology  (+) Blood dyscrasia, anemia ,   Anesthesia Other Findings Past Medical History: No date: Allergy No date: Arthritis No date: Chronic kidney disease     Comment:  CKD V No date: GERD (gastroesophageal reflux disease) No date: Hypertension 2002: Lupus ([place]) No date: Macular degeneration of left eye 2007: Myocardial infarction [facility]) No date: Osteoporosis Past Surgical History: 01/16/2018: A/V FISTULAGRAM; Right     Comment:  Procedure: A/V FISTULAGRAM;  Surgeon: [name],               MD;  Location: [place] CV [facility];  Service:               Cardiovascular;  Laterality: Right; No date: ABDOMINAL HYSTERECTOMY 12/17/2017: AV FISTULA PLACEMENT; Right     Comment:  Procedure: ARTERIOVENOUS (AV) FISTULA CREATION ( BRACHIO              CEPHALIC POSS. BRACHIO BASILIC );  Surgeon: Algernon Huxley,              MD;  Location: [facility] ORS;  Service: Vascular;  Laterality:              Right; 10/04/2020: CARDIOVERSION; N/A     Comment:  Procedure: CARDIOVERSION;  Surgeon: Kate Sable,               MD;  Location: [facility]  ORS;  Service: Cardiovascular;                Laterality: N/A; 01/03/2016: CATARACT EXTRACTION W/PHACO; Right     Comment:  Procedure: CATARACT EXTRACTION PHACO AND INTRAOCULAR               LENS PLACEMENT ([place]);  Surgeon: Leandrew Koyanagi, MD;               Location: [place];  Service: Ophthalmology;                Laterality: Right; No date: COLON SURGERY     Comment:  intestine became twisted after hernia sugery No date: EYE SURGERY     Comment:  cataract No date: HERNIA REPAIR No date: TEMPORAL ARTERY BIOPSY / LIGATION No date: TONSILLECTOMY   Reproductive/Obstetrics negative OB ROS                             Anesthesia Physical Anesthesia Plan  ASA: 4 and emergent  Anesthesia Plan: General ETT   Post-op Pain Management:    Induction:   PONV Risk Score and Plan:   Airway Management Planned:   Additional Equipment:   Intra-op Plan:  Post-operative Plan:   Informed Consent: I have reviewed the patients History and Physical, chart, labs and discussed the procedure including the risks, benefits and alternatives for the proposed anesthesia with the patient or authorized representative who has indicated his/her understanding and acceptance.     Dental Advisory Given  Plan Discussed with: CRNA  Anesthesia Plan Comments:         Anesthesia Quick Evaluation

## 2020-11-13 NOTE — ED Provider Notes (Addendum)
[facility] Emergency Department Provider Note    Event Date/Time   First MD Initiated Contact with Patient 11/01/2020 [phone]     (approximate)  I have reviewed the triage vital signs and the nursing notes.   HISTORY  Chief Complaint Diarrhea    HPI Marissa Hess is a 86 y.o. female below listed past medical history presents to the ER for evaluation of several days of back pain as well as lower abdominal pain associated with watery diarrhea.  No melena or hematochezia.  No recent medication changes.  Denies any history of constipation.  Denies any numbness or tingling.  No dysuria.  Denies any fevers or chills.  Past Medical History:  Diagnosis Date   Allergy    Arthritis    Chronic kidney disease    CKD V   GERD (gastroesophageal reflux disease)    Hypertension    Lupus ([place]) 2002   Macular degeneration of left eye    Myocardial infarction [facility]) 2007   Osteoporosis    Family History  Problem Relation Age of Onset   Stomach cancer Mother    Rheum arthritis Father    Thyroid disease Daughter    Varicose Veins Maternal Aunt    Breast cancer Neg Hx    Past Surgical History:  Procedure Laterality Date   A/V FISTULAGRAM Right 01/16/2018   Procedure: A/V FISTULAGRAM;  Surgeon: [name], MD;  Location: [place] [facility];  Service: Cardiovascular;  Laterality: Right;   ABDOMINAL HYSTERECTOMY     AV FISTULA PLACEMENT Right 12/17/2017   Procedure: ARTERIOVENOUS (AV) FISTULA CREATION ( BRACHIO CEPHALIC POSS. BRACHIO BASILIC );  Surgeon: Algernon Huxley, MD;  Location: [facility] ORS;  Service: Vascular;  Laterality: Right;   CARDIOVERSION N/A 10/04/2020   Procedure: CARDIOVERSION;  Surgeon: Kate Sable, MD;  Location: [facility] ORS;  Service: Cardiovascular;  Laterality: N/A;   CATARACT EXTRACTION W/PHACO Right 01/03/2016   Procedure: CATARACT EXTRACTION PHACO AND INTRAOCULAR LENS PLACEMENT ([place]);  Surgeon: Leandrew Koyanagi, MD;  Location: [place];  Service: Ophthalmology;  Laterality: Right;   COLON SURGERY     intestine became twisted after hernia sugery   EYE SURGERY     cataract   HERNIA REPAIR     TEMPORAL ARTERY BIOPSY / LIGATION     TONSILLECTOMY     Patient Active Problem List   Diagnosis Date Noted   Pulmonary hypertension ([place]) 11/08/2020   Low back pain 11/08/2020   Atrial flutter ([place]) 09/06/2020   Poor mobility 05/25/2019   Dependence on renal dialysis ([place]) 08/20/2018   B12 deficiency 01/26/2018   ESRD (end stage renal disease) ([place]) 01/23/2018   Anemia in CKD (chronic kidney disease) 10/20/2017   Advanced care planning/counseling discussion 09/25/2016   Gout 10/16/2015   Claudication of both lower extremities ([place]) 10/16/2015   Raynaud's phenomenon without gangrene 04/03/2015   Restless leg syndrome 09/26/2014   Hypertensive nephropathy 08/19/2014   Hyperlipemia 08/19/2014   Allergic rhinitis 08/19/2014   Senile osteoporosis 08/19/2014   Esophageal reflux 08/19/2014      Prior to Admission medications   Medication Sig Start Date End Date Taking? Authorizing Provider  apixaban (ELIQUIS) 2.5 MG TABS tablet Take 1 tablet (2.5 mg total) by mouth 2 (two) times daily. 09/06/20   Cannady, Henrine Screws T, NP  carvedilol (COREG) 25 MG tablet Take 1 tablet (25 mg total) by mouth 2 (two) times daily with a meal. 11/12/19   Cannady, Barbaraann Faster, NP  cholecalciferol (  VITAMIN D) 1000 units tablet Take 1,000 Units by mouth daily.     [provider]  ferrous sulfate (SLOW FE) 160 (50 Fe) MG TBCR SR tablet Take 160 mg by mouth daily.    [provider]  folic acid (FOLVITE) 1 MG tablet Take 1 tablet (1 mg total) by mouth daily. 12/09/19   Johnson, Megan P, DO  lidocaine (LIDODERM) 5 % Place 1 patch onto the skin daily. Remove & Discard patch within 12 hours or as directed by MD 11/08/20   Marnee Guarneri T, NP  mirtazapine (REMERON) 7.5 MG tablet Take 7.5 mg by mouth at bedtime. 08/31/20   [provider]  rOPINIRole (REQUIP) 0.5 MG tablet Take one tablet (0.5 MG) by mouth in the morning and then take two tablets (1 MG) by mouth at night before bedtime. Patient taking differently: Take 0.5-1 mg by mouth See admin instructions. Take one tablet (0.5 MG) by mouth in the morning and then take two tablets (1 MG) by mouth at night before bedtime. 01/11/20   Cannady, Henrine Screws T, NP  tiZANidine (ZANAFLEX) 2 MG tablet Take 1 tablet (2 mg total) by mouth daily as needed for muscle spasms. 11/03/2020   Marnee Guarneri T, NP    Allergies Codeine, Ibuprofen, Sulfa antibiotics, Vioxx [rofecoxib], Celebrex [celecoxib], and Penicillins    Social History Social History   Tobacco Use   Smoking status: Former    Types: Cigarettes    Start date: 09/11/1984    Quit date: 09/26/1994    Years since quitting: 26.1   Smokeless tobacco: Never  Vaping Use   Vaping Use: Never used  Substance Use Topics   Alcohol use: No    Alcohol/week: 0.0 standard drinks   Drug use: No    Review of Systems Patient denies headaches, rhinorrhea, blurry vision, numbness, shortness of breath, chest pain, edema, cough, abdominal pain, nausea, vomiting, diarrhea, dysuria, fevers, rashes or hallucinations unless otherwise stated above in HPI. ____________________________________________   PHYSICAL EXAM:  VITAL SIGNS: Vitals:   10/25/2020 1011 11/15/2020 1030  BP: (!) 146/74 131/73  Pulse: 72 70  Resp: 16 (!) 32  Temp:    SpO2: 100% 100%    Constitutional: Alert and oriented.  Eyes: Conjunctivae are normal.  Head: Atraumatic. Nose: No congestion/rhinnorhea. Mouth/Throat: Mucous membranes are moist.   Neck: No stridor. Painless ROM.  Cardiovascular: Normal rate, regular rhythm. Grossly normal heart sounds.  Good peripheral circulation. Respiratory: Normal respiratory effort.  No retractions. Lungs CTAB. Gastrointestinal: Soft with mild ttp, no guarding or rebound.. No distention. No CVA  tenderness. Genitourinary:  Musculoskeletal: No lower extremity tenderness nor edema.  No joint effusions. Neurologic:  Normal speech and language. No gross focal neurologic deficits are appreciated. No facial droop Skin:  Skin is warm, dry and intact. No rash noted. Psychiatric: Mood and affect are normal. Speech and behavior are normal.  ____________________________________________   LABS (all labs ordered are listed, but only abnormal results are displayed)  Results for orders placed or performed during the hospital encounter of 11/12/2020 (from the past 24 hour(s))  Lipase, blood     Status: None   Collection Time: 10/25/2020  6:15 AM  Result Value Ref Range   Lipase 27 11 - 51 U/L  Comprehensive metabolic panel     Status: Abnormal   Collection Time: 10/18/2020  6:15 AM  Result Value Ref Range   Sodium 135 135 - 145 mmol/L   Potassium 4.3 3.5 - 5.1 mmol/L   Chloride  102 98 - 111 mmol/L   CO2 22 22 - 32 mmol/L   Glucose, Bld 78 70 - 99 mg/dL   BUN 28 (H) 8 - 23 mg/dL   Creatinine, Ser 3.96 (H) 0.44 - 1.00 mg/dL   Calcium 8.9 8.9 - 10.3 mg/dL   Total Protein 7.1 6.5 - 8.1 g/dL   Albumin 3.1 (L) 3.5 - 5.0 g/dL   AST 17 15 - 41 U/L   ALT 10 0 - 44 U/L   Alkaline Phosphatase 47 38 - 126 U/L   Total Bilirubin 1.3 (H) 0.3 - 1.2 mg/dL   GFR, Estimated 11 (L) >60 mL/min   Anion gap 11 5 - 15  CBC     Status: Abnormal   Collection Time: 11/04/2020  6:15 AM  Result Value Ref Range   WBC 5.4 4.0 - 10.5 K/uL   RBC 3.88 3.87 - 5.11 MIL/uL   Hemoglobin 11.5 (L) 12.0 - 15.0 g/dL   HCT 35.0 (L) 36.0 - 46.0 %   MCV 90.2 80.0 - 100.0 fL   MCH 29.6 26.0 - 34.0 pg   MCHC 32.9 30.0 - 36.0 g/dL   RDW 19.6 (H) 11.5 - 15.5 %   Platelets 197 150 - 400 K/uL   nRBC 0.0 0.0 - 0.2 %  Type and screen [facility]     Status: None (Preliminary result)   Collection Time: 11/06/2020  9:46 AM  Result Value Ref Range   ABO/RH(D) PENDING    Antibody Screen PENDING    Sample Expiration       11/26/2020,2359 Performed at [facility], [address] [place], [place] [address]   Protime-INR     Status: Abnormal   Collection Time: 10/25/2020  9:46 AM  Result Value Ref Range   Prothrombin Time 15.5 (H) 11.4 - 15.2 seconds   INR 1.2 0.8 - 1.2   ____________________________________________  EKG My review and personal interpretation at Time: 9:48   Indication: AAA  Rate: 70  Rhythm: sinus Axis: left Other: borderline prolonged qt, no stemi ____________________________________________  RADIOLOGY  I personally reviewed all radiographic images ordered to evaluate for the above acute complaints and reviewed radiology reports and findings.  These findings were personally discussed with the patient.  Please see medical record for radiology report.  ____________________________________________   PROCEDURES  Procedure(s) performed:  .Critical Care  Date/Time: 10/18/2020 9:45 AM Performed by: Merlyn Lot, MD Authorized by: Merlyn Lot, MD   Critical care provider statement:    Critical care time (minutes):  36   Critical care time was exclusive of:  Separately billable procedures and treating other patients   Critical care was necessary to treat or prevent imminent or life-threatening deterioration of the following conditions:  Circulatory failure   Critical care was time spent personally by me on the following activities:  Development of treatment plan with patient or surrogate, discussions with consultants, evaluation of patient's response to treatment, examination of patient, obtaining history from patient or surrogate, ordering and performing treatments and interventions, ordering and review of laboratory studies, ordering and review of radiographic studies, pulse oximetry, re-evaluation of patient's condition and review of old charts    Critical Care performed: yes ____________________________________________   INITIAL IMPRESSION / [place] / ED COURSE  Pertinent labs & imaging results that were available during my care of the patient were reviewed by me and considered in my medical decision making (see chart for details).   DDX: Enteritis, diverticulitis, [place],  musculoskeletal strain, SBO, perforation, AAA  Marissa Hess is a 86 y.o. who presents to the ED with presentation as described above.  Patient nontoxic-appearing but with multiple comorbidities.  No white count no fever but hypertensive.  CT imaging ordered for above differential.  The patient will be placed on continuous pulse oximetry and telemetry for monitoring.  Laboratory evaluation will be sent to evaluate for the above complaints.     Clinical Course as of 10/16/2020 1044  Mon Nov 13, 2020  0925 Reviewed CT imaging does appear to have large infrarenal AAA with periaortic edema concerning for small leak.  Have paged vascular surgery.  Will place on Cardene for blood pressure control. [PR]  [phone] Discussed case with Dr. Delana Meyer of vascular surgery as well as Dr. Lucky Cowboy.  Will reverse Eliquis with Andexxa has plan is to take her emergently to the OR.  She has been placed on Cardene infusion.  Continues to mentate well she is hypertensive.  Patient and family updated at bedside. [PR]  1034 Pressure significantly improved.  We will continue to titrate Cardene while awaiting transport to the OR [PR]    Clinical Course User Index [PR] Merlyn Lot, MD    The patient was evaluated in Emergency Department today for the symptoms described in the history of present illness. He/she was evaluated in the context of the global COVID-19 pandemic, which necessitated consideration that the patient might be at risk for infection with the SARS-CoV-2 virus that causes COVID-19. Institutional protocols and algorithms that pertain to the evaluation of patients at risk for COVID-19 are in a state of rapid change based on information released by regulatory bodies including the CDC  and federal and state organizations. These policies and algorithms were followed during the patient's care in the ED.  As part of my medical decision making, I reviewed the following data within the [facility] notes reviewed and incorporated, Labs reviewed, notes from prior ED visits and [place] Controlled Substance Database   ____________________________________________   FINAL CLINICAL IMPRESSION(S) / ED DIAGNOSES  Final diagnoses:  Abdominal aortic aneurysm (AAA) without rupture (HCC)      NEW MEDICATIONS STARTED DURING THIS VISIT:  New Prescriptions   No medications on file     Note:  This document was prepared using Dragon voice recognition software and may include unintentional dictation errors.    Merlyn Lot, MD 10/24/2020 1015    Merlyn Lot, MD 11/08/2020 1044

## 2020-11-13 NOTE — H&P (Signed)
[facility] Admission History & Physical  MRN : [id number]  Marissa Hess is a 86 y.o. (Jul 22, 1934) female who presents with chief complaint of  Chief Complaint  Patient presents with   Diarrhea   History of Present Illness:  Marissa Hess is a 86 y.o. female below listed past medical history presents to the ER for evaluation of several days of back pain as well as lower abdominal pain associated with watery diarrhea.  No melena or hematochezia.  No recent medication changes.  Denies any history of constipation.  Denies any numbness or tingling.  No dysuria.  Denies any fevers or chills.  CT Abd / Pelvis (11/10/2020): Fusiform aneurysmal dilatation of mid to distal abdominal aorta measuring 7.3 x 6.3 cm in greatest axial dimensions and extending 6.4 cm length. High attenuation intramural hematoma identified. Perianeurysmal fat planes demonstrate minimal infiltration highly suspicious for aneurysmal leak. Aneurysmal dilatation extends into the aortic bifurcation with dilatation of the common iliac arteries bilaterally 20 mm RIGHT and 17 mm LEFT. Aneurysmal dilatation of RIGHT internal iliac artery 26 mm diameter.  Vascular surgery was consulted by Dr. Dellia Nims for possible endovascular abdominal aortic aneurysm repair.  Current Facility-Administered Medications  Medication Dose Route Frequency Provider Last Rate Last Admin   [MAR Hold] morphine 2 MG/ML injection 2 mg  2 mg Intravenous Q3H PRN Merlyn Lot, MD   2 mg at 10/31/2020 0959   nicardipine (CARDENE) '20mg'$  in 0.86% saline 257m IV infusion (0.1 mg/ml)  3-15 mg/hr Intravenous Continuous RMerlyn Lot MD 50 mL/hr at 11/06/2020 1119 5 mg/hr at 10/19/2020 1119   Past Medical History:  Diagnosis Date   Allergy    Arthritis    Chronic kidney disease    CKD V   GERD (gastroesophageal reflux disease)    Hypertension    [place]   Macular degeneration of left eye    Myocardial infarction ([facility]  2007   Osteoporosis    Past Surgical History:  Procedure Laterality Date   A/V FISTULAGRAM Right 01/16/2018   Procedure: A/V FISTULAGRAM;  Surgeon: PShelda Altes MD;  [facility];  Service: Cardiovascular;  Laterality: Right;   ABDOMINAL HYSTERECTOMY     AV FISTULA PLACEMENT Right 12/17/2017   Procedure: ARTERIOVENOUS (AV) FISTULA CREATION ( BRACHIO CEPHALIC POSS. BRACHIO BASILIC );  Surgeon: DAlgernon Huxley MD;  Location: [facility];  Service: Vascular;  Laterality: Right;   CARDIOVERSION N/A 10/04/2020   Procedure: CARDIOVERSION;  Surgeon: AKate Sable MD;  Location: [facility];  Service: Cardiovascular;  Laterality: N/A;   CATARACT EXTRACTION W/PHACO Right 01/03/2016   Procedure: CATARACT EXTRACTION PHACO AND INTRAOCULAR LENS PLACEMENT ([place];  Surgeon: CLeandrew Koyanagi MD;  [place]  Service: Ophthalmology;  Laterality: Right;   COLON SURGERY     intestine became twisted after hernia sugery   EYE SURGERY     cataract   HERNIA REPAIR     TEMPORAL ARTERY BIOPSY / LIGATION     TONSILLECTOMY     Social History Social History   Tobacco Use   Smoking status: Former    Types: Cigarettes    Start date: 09/11/1984    Quit date: 09/26/1994    Years since quitting: 26.1   Smokeless tobacco: Never  Vaping Use   Vaping Use: Never used  Substance Use Topics   Alcohol use: No    Alcohol/week: 0.0 standard drinks   Drug use: No   Family History Family History  Problem  Relation Age of Onset   Stomach cancer Mother    Rheum arthritis Father    Thyroid disease Daughter    Varicose Veins Maternal Aunt    Breast cancer Neg Hx   Denies family history of peripheral artery disease, renal disease or aneurysmal disease.  Allergies  Allergen Reactions   Codeine Itching   Ibuprofen     Avoids "because of lupus"   Sulfa Antibiotics Diarrhea   Vioxx [Rofecoxib]     Avoids "because of lupus"   Celebrex [Celecoxib] Rash    Avoids "because of  lupus"   Penicillins Rash    Reaction: over 30 years ago   REVIEW OF SYSTEMS (Negative unless checked)  Constitutional: '[]'$ Weight loss  '[]'$ Fever  '[]'$ Chills Cardiac: '[]'$ Chest pain   '[]'$ Chest pressure   '[]'$ Palpitations   '[]'$ Shortness of breath when laying flat   '[]'$ Shortness of breath at rest   '[]'$ Shortness of breath with exertion. Vascular:  '[]'$ Pain in legs with walking   '[]'$ Pain in legs at rest   '[]'$ Pain in legs when laying flat   '[]'$ Claudication   '[]'$ Pain in feet when walking  '[]'$ Pain in feet at rest  '[]'$ Pain in feet when laying flat   '[]'$ History of DVT   '[]'$ Phlebitis   '[x]'$ Swelling in legs   '[]'$ Varicose veins   '[]'$ Non-healing ulcers Pulmonary:   '[]'$ Uses home oxygen   '[]'$ Productive cough   '[]'$ Hemoptysis   '[]'$ Wheeze  '[]'$ COPD   '[]'$ Asthma Neurologic:  '[]'$ Dizziness  '[]'$ Blackouts   '[]'$ Seizures   '[]'$ History of stroke   '[]'$ History of TIA  '[]'$ Aphasia   '[]'$ Temporary blindness   '[]'$ Dysphagia   '[]'$ Weakness or numbness in arms   '[]'$ Weakness or numbness in legs Musculoskeletal:  '[x]'$ Arthritis   '[]'$ Joint swelling   '[]'$ Joint pain   '[]'$ Low back pain Hematologic:  '[]'$ Easy bruising  '[]'$ Easy bleeding   '[]'$ Hypercoagulable state   '[]'$ Anemic  '[]'$ Hepatitis Gastrointestinal:  '[]'$ Blood in stool   '[]'$ Vomiting blood  '[]'$ Gastroesophageal reflux/heartburn   '[]'$ Difficulty swallowing. Genitourinary:  '[]'$ Chronic kidney disease   '[]'$ Difficult urination  '[]'$ Frequent urination  '[]'$ Burning with urination   '[]'$ Blood in urine Skin:  '[]'$ Rashes   '[]'$ Ulcers   '[]'$ Wounds Psychological:  '[]'$ History of anxiety   '[]'$  History of major depression.  Positive for progressively worsening back pain.  Physical Examination  Vitals:   10/17/2020 1000 10/29/2020 1011 11/10/2020 1030 10/30/2020 1040  BP: (!) 176/90 (!) 146/74 131/73 125/76  Pulse:  72 70   Resp: (!) 21 16 (!) 32 18  Temp:      TempSrc:      SpO2:  100% 100%    There is no height or weight on file to calculate BMI. Gen: WD/WN, NAD Head: [place]/AT, No temporalis wasting.  Ear/Nose/Throat: Hearing grossly intact, nares w/o erythema or drainage,  oropharynx w/o Erythema/Exudate, Eyes: Sclera non-icteric, conjunctiva clear Neck: Supple, no nuchal rigidity.  No JVD.  Pulmonary:  Good air movement, no increased work of respiration or use of accessory muscles  Cardiac: RRR, normal S1, S2, no Murmurs, rubs or gallops. Vascular: Vessel Right Left  Radial Palpable Palpable  Ulnar Palpable Palpable  Brachial Palpable Palpable  Carotid Palpable, without bruit Palpable, without bruit  Aorta Not palpable N/A  Femoral Palpable Palpable  Popliteal Palpable Palpable  PT Palpable Palpable  DP Palpable Palpable   Gastrointestinal: soft, non-tender/non-distended. No guarding/reflex. No masses, surgical incisions, or scars. Musculoskeletal: M/S 5/5 throughout.  No deformity or atrophy.  Mild edema Neurologic: Sensation grossly intact in extremities.  Symmetrical.  Speech is fluent. Motor exam as listed  above. Psychiatric: Judgment intact, Mood & affect appropriate for pt's clinical situation. Dermatologic: No rashes or ulcers noted.  No cellulitis or open wounds. Lymph : No Cervical, Axillary, or Inguinal lymphadenopathy.  CBC Lab Results  Component Value Date   WBC 5.4 11/05/2020   HGB 11.5 (L) 11/08/2020   HCT 35.0 (L) 11/12/2020   MCV 90.2 11/07/2020   PLT 197 11/10/2020   BMET    Component Value Date/Time   NA 135 10/31/2020 0615   NA 140 09/06/2020 1035   K 4.3 11/04/2020 0615   CL 102 11/07/2020 0615   CO2 22 11/14/2020 0615   GLUCOSE 78 11/01/2020 0615   BUN 28 (H) 11/05/2020 0615   BUN 12 09/06/2020 1035   CREATININE 3.96 (H) 10/19/2020 0615   CALCIUM 8.9 10/19/2020 0615   GFRNONAA 11 (L) 10/30/2020 0615   GFRAA 20 (L) 11/26/2018 1346   Estimated Creatinine Clearance: 8.7 mL/min (A) (by C-G formula based on SCr of 3.96 mg/dL (H)).  COAG Lab Results  Component Value Date   INR 1.2 11/15/2020   INR 0.95 12/11/2017   Radiology CT ABDOMEN PELVIS WO CONTRAST  Result Date: 11/10/2020 CLINICAL DATA:  Lower back and  abdominal pain for 2 weeks, diarrhea since Saturday, vomiting since yesterday, suspected diverticulitis, history end-stage renal disease on dialysis EXAM: CT ABDOMEN AND PELVIS WITHOUT CONTRAST TECHNIQUE: Multidetector CT imaging of the abdomen and pelvis was performed following the standard protocol without IV contrast. Sagittal and coronal MPR images reconstructed from axial data set. No oral contrast administered. COMPARISON:  None FINDINGS: Lower chest: Subsegmental atelectasis LEFT lower lobe. Tiny RIGHT pleural effusion. Hepatobiliary: Small calcified gallstones dependently in gallbladder. Liver unremarkable. Pancreas: Normal appearance Spleen: Normal appearance Adrenals/Urinary Tract: Atrophic kidneys. Few tiny renal cysts. No urinary tract calcification or dilatation. Bladder unremarkable. Stomach/Bowel: Diverticulosis of descending and sigmoid colon without evidence of diverticulitis. Appendix not visualized. Stomach and bowel loops otherwise unremarkable for technique. Vascular/Lymphatic: Extensive atherosclerotic calcifications aorta, iliac arteries, minimally femoral arteries. Tortuous abdominal aorta. Fusiform aneurysmal dilatation of mid to distal abdominal aorta measuring 7.3 x 6.3 cm in greatest axial dimensions and extending 6.4 cm length. High attenuation intramural hematoma identified. Perianeurysmal fat planes demonstrate minimal infiltration highly suspicious for aneurysmal leak. Aneurysmal dilatation extends into the aortic bifurcation with dilatation of the common iliac arteries bilaterally 20 mm RIGHT and 17 mm LEFT. Aneurysmal dilatation of RIGHT internal iliac artery 26 mm diameter. No adenopathy. Reproductive: Uterus surgically absent.  Ovaries not visualized. Other: Small amount of free fluid in pelvis and perisplenic. No free air. LEFT inguinal hernia containing fat. Prior ventral hernia repair. Nonspecific stranding of presacral fat and scattered subcutaneous stranding, question related  to patient's end-stage renal disease, missed dialysis this morning. Musculoskeletal: Osseous demineralization. IMPRESSION: Fusiform aneurysmal dilatation of the mid to distal abdominal aorta measuring 7.3 x 6.3 cm in greatest axial dimensions and extending 6.4 cm length, associated with high attenuation acute intramural hematoma and minimal infiltration of fat planes highly suspicious for aneurysmal leak. These findings were emergently called to Dr. Quentin Cornwall on 11/09/2020 at 0920 hours, prior to dictation of this report. Aneurysmal dilatation of common iliac arteries bilaterally, and RIGHT internal iliac artery to 26 mm diameter, without evidence of hemorrhage/leak. Distal colonic diverticulosis without evidence of diverticulitis. Cholelithiasis. Small amount of nonspecific free intraperitoneal fluid. LEFT inguinal hernia containing fat. Aortic Atherosclerosis (ICD10-I70.0). Critical Value/emergent results were called by telephone at the time of interpretation on 11/08/2020 at 9:30 am to provider [facility] ,  who verbally acknowledged these results. Electronically Signed   By: Lavonia Dana M.D.   On: 10/19/2020 09:31    Assessment/Plan The patient is an 86 year old female with a past medical history of hyperlipidemia, osteoporosis, end-stage renal disease, PAD, atrial flutter, pulmonary hypertension who presented to the [facility] emergency department with a chief complaint of progressively worsening back and abdominal pain found to have a ruptured abdominal aortic aneurysm  1.  Abdominal aortic aneurysm: Patient presents with progressively worsening back and stomach pain.  Patient found to have a ruptured abdominal aortic aneurysm measuring 7.3 x 6.3cm.  In the setting of an abdominal aortic aneurysm which is ruptured recommend emergent repair as this would eventually be fatal.  Dr. Lucky Cowboy and I, had a long discussion with the patient and her 2 family members at the bedside.  Procedure,  risks and benefits were explained.  All questions were answered.  We will plan on repairing this aneurysm emergently this afternoon with Dr. Murlean Iba.  2.  End-stage renal disease: The patient is a long term chronic end-stage renal disease who is currently maintained via hemodialysis Consult has been placed to nephrology  3.  Hyperlipidemia: Would consider the addition of aspirin and statin for medical management  Patient will be admitted to our service Discussed with Dr. Mayme Genta, PA-C  10/28/2020 11:23 AM

## 2020-11-13 NOTE — ED Notes (Signed)
Pt transported to vascular OR at this time.

## 2020-11-13 NOTE — Telephone Encounter (Signed)
Copied from [place] [id number]. Topic: General - Inquiry >> Nov 10, 2020 10:18 AM Greggory Keen D wrote: Reason for CRM:   pt called saying she was in Wednesday for back pain.  She said she was called in a patch but it was 170.00 and she could not afford to purchase it.  She is still having a lot of back pain and wants to know if there is something else she can take.  CB#  [id number]  [facility] [address]   Routing to provider to advise.

## 2020-11-13 NOTE — Addendum Note (Signed)
Addended by: Marnee Guarneri T on: 11/06/2020 10:24 AM   Modules accepted: Orders

## 2020-11-13 NOTE — Consult Note (Signed)
NAME:  Marissa Hess, MRN:  [id number], DOB:  02/13/1935, LOS: 0 ADMISSION DATE:  10/19/2020, CONSULTATION DATE: 10/30/2020 REFERRING MD: Dr. Delana Meyer CHIEF COMPLAINT: Ruptured AAA  History of Present Illness:  This is an 86 yo female who presented to [facility] ER on 08/29 with c/o lower back/abdominal pain and watery diarrhea onset of symptoms 2 weeks prior to presentation.  She does have a hx of ESRD on hemodialysis M/F and missed her HD session the morning of 08/29.    ED course: Upon arrival to the ER lab results revealed BUN 28, creatinine 3.96, albumin 3.1, and hgb 11.5.  CT Abd Pelvis revealed a large intrarenal AAA with periaortic edema concerning for small leak.  Pt also hypertensive with sbp 160-180's.  Pt placed on nicardipine gtt for bp control and vascular surgery consulted. Per vascular surgery recommendations Eliquis (hx atrial flutter) reversed with Andexxa and pt transported to the OR for emergent selective angiography, coil embolization, and stent graft placement.  Pt admitted to ICU postop and PCCM team consulted to assist with management.    Pertinent  Medical History  Allergies  Arthritis  ESRD on HD: M/F GERD  HTN Lupus Lupus nephritis  Left Eye Macular Degeneration  MI (2007) Osteoporosis  Atrial Flutter Oropharyngeal Dysphagia    Significant Hospital Events: Including procedures, antibiotic start and stop dates in addition to other pertinent events   08/29: Pt admitted to ICU s/p emergent selective angiography, coil embolization and stent graft placement of AAA, juxtarenal    with contained rupture   Interim History / Subjective:  Pt resting in bed c/o bilateral leg heaviness and inability to feel feet.  Bilateral feet cold to touch 1+ palpable distal pulses present.  Vascular surgery aware of pt complaints   Objective   Blood pressure 125/76, pulse 70, temperature 97.9 F (36.6 C), temperature source Oral, resp. rate 18, SpO2 100 %.        Intake/Output  Summary (Last 24 hours) at 11/10/2020 1339 Last data filed at 10/29/2020 1310 Gross per 24 hour  Intake 50 ml  Output 100 ml  Net -50 ml   There were no vitals filed for this visit.  Examination: General: Elderly female resting in bed, NAD  HENT: supple Lungs: clear throughout, even, non labored  Cardiovascular: nsr, rrr, no R/G, 2+ radial/1+ distal palpable pulses, no edema  Abdomen: +BS x4, obese, soft, non tender, non distended  Extremities: moves all extremities although c/o bilateral leg heaviness and inability to feel feet, bilateral feet cold to touch  Skin: bilateral groin PAD's in place no bleeding or hematoma present; RUE fistula +bruit and thrill  Neuro: alert and oriented, follows commands GU: indwelling foley in place draining small amount of yellow urine   Resolved Hospital Problem list     Assessment & Plan:   AAA, juxtarenal with contained rupture s/p emergent angiography, coil embolization, and stent graft placement  Hypertension  Hx: Atrial flutter/fibrillation  - Continuous telemetry monitoring  - Continue outpatient carvedilol and prn labetalol for hypertension  - Maintain map >65 - Lipid panel pending  - Trend CBC - Monitor for s/sx of bleeding  - Transfuse for hgb <7 - Hold outpatient eliquis   ESRD on Hemodialysis  Hx: Anemia secondary to ESRD  - Trend BMP  - Replace electrolytes as indicated  - Avoid nephrotoxic medications - Nephrology consulted appreciate input~HD per recommendations   Best Practice (right click and "Reselect all SmartList Selections" daily)   Diet/type: clear liquids DVT  prophylaxis: SCD GI prophylaxis: N/A Lines: N/A Foley:  Yes Code Status:  full code Last date of multidisciplinary goals of care discussion [N/A]  Labs   CBC: Recent Labs  Lab 11/11/2020 0615  WBC 5.4  HGB 11.5*  HCT 35.0*  MCV 90.2  PLT [id number]    Basic Metabolic Panel: Recent Labs  Lab 11/15/2020 0615  NA 135  K 4.3  CL 102  CO2 22  GLUCOSE  78  BUN 28*  CREATININE 3.96*  CALCIUM 8.9   GFR: Estimated Creatinine Clearance: 8.7 mL/min (A) (by C-G formula based on SCr of 3.96 mg/dL (H)). Recent Labs  Lab 11/12/2020 0615  WBC 5.4    Liver Function Tests: Recent Labs  Lab 11/15/2020 0615  AST 17  ALT 10  ALKPHOS 47  BILITOT 1.3*  PROT 7.1  ALBUMIN 3.1*   Recent Labs  Lab 10/16/2020 0615  LIPASE 27   No results for input(s): AMMONIA in the last 168 hours.  ABG No results found for: PHART, PCO2ART, PO2ART, HCO3, TCO2, ACIDBASEDEF, O2SAT   Coagulation Profile: Recent Labs  Lab 10/19/2020 0946  INR 1.2    Cardiac Enzymes: No results for input(s): CKTOTAL, CKMB, CKMBINDEX, TROPONINI in the last 168 hours.  HbA1C: No results found for: HGBA1C  CBG: No results for input(s): GLUCAP in the last 168 hours.  Review of Systems: Positives in BOLD   Gen: Denies fever, chills, weight change, fatigue, night sweats HEENT: Denies blurred vision, double vision, hearing loss, tinnitus, sinus congestion, rhinorrhea, sore throat, neck stiffness, dysphagia PULM: Denies shortness of breath, cough, sputum production, hemoptysis, wheezing CV: Denies chest pain, edema, orthopnea, paroxysmal nocturnal dyspnea, palpitations GI: abdominal/lower back pain, nausea, vomiting, watery stools, hematochezia, melena, constipation, change in bowel habits GU: Denies dysuria, hematuria, polyuria, oliguria, urethral discharge Endocrine: Denies hot or cold intolerance, polyuria, polyphagia or appetite change Derm: Denies rash, dry skin, scaling or peeling skin change Heme: Denies easy bruising, bleeding, bleeding gums Neuro: Denies headache, numbness, weakness, slurred speech, loss of memory or consciousness   Past Medical History:  She,  has a past medical history of Allergy, Arthritis, Chronic kidney disease, GERD (gastroesophageal reflux disease), Hypertension, Lupus ([place]) (2002), Macular degeneration of left eye, Myocardial infarction ([place])  (2007), and Osteoporosis.   Surgical History:   Past Surgical History:  Procedure Laterality Date   A/V FISTULAGRAM Right 01/16/2018   Procedure: A/V FISTULAGRAM;  Surgeon: [name], MD;  Location: [facility];  Service: Cardiovascular;  Laterality: Right;   ABDOMINAL HYSTERECTOMY     AV FISTULA PLACEMENT Right 12/17/2017   Procedure: ARTERIOVENOUS (AV) FISTULA CREATION ( BRACHIO CEPHALIC POSS. BRACHIO BASILIC );  Surgeon: Algernon Huxley, MD;  Location: [facility];  Service: Vascular;  Laterality: Right;   CARDIOVERSION N/A 10/04/2020   Procedure: CARDIOVERSION;  Surgeon: Kate Sable, MD;  Location: [facility];  Service: Cardiovascular;  Laterality: N/A;   CATARACT EXTRACTION W/PHACO Right 01/03/2016   Procedure: CATARACT EXTRACTION PHACO AND INTRAOCULAR LENS PLACEMENT ([place]);  Surgeon: Leandrew Koyanagi, MD;  Location: [place];  Service: Ophthalmology;  Laterality: Right;   COLON SURGERY     intestine became twisted after hernia sugery   EYE SURGERY     cataract   HERNIA REPAIR     TEMPORAL ARTERY BIOPSY / LIGATION     TONSILLECTOMY       Social History:   reports that she quit smoking about 26 years ago. Her smoking use included cigarettes. She started smoking about  36 years ago. She has never used smokeless tobacco. She reports that she does not drink alcohol and does not use drugs.   Family History:  Her family history includes Rheum arthritis in her father; Stomach cancer in her mother; Thyroid disease in her daughter; Varicose Veins in her maternal aunt. There is no history of Breast cancer.   Allergies Allergies  Allergen Reactions   Codeine Itching   Ibuprofen     Avoids "because of lupus"   Sulfa Antibiotics Diarrhea   Vioxx [Rofecoxib]     Avoids "because of lupus"   Celebrex [Celecoxib] Rash    Avoids "because of lupus"   Penicillins Rash    Reaction: over 30 years ago     Home Medications  Prior to Admission medications    Medication Sig Start Date End Date Taking? Authorizing Provider  apixaban (ELIQUIS) 2.5 MG TABS tablet Take 1 tablet (2.5 mg total) by mouth 2 (two) times daily. 09/06/20   Cannady, Henrine Screws T, NP  carvedilol (COREG) 25 MG tablet Take 1 tablet (25 mg total) by mouth 2 (two) times daily with a meal. 11/12/19   Cannady, Jolene T, NP  cholecalciferol (VITAMIN D) 1000 units tablet Take 1,000 Units by mouth daily.     [provider]  ferrous sulfate (SLOW FE) 160 (50 Fe) MG TBCR SR tablet Take 160 mg by mouth daily.    [provider]  folic acid (FOLVITE) 1 MG tablet Take 1 tablet (1 mg total) by mouth daily. 12/09/19   Johnson, Megan P, DO  lidocaine (LIDODERM) 5 % Place 1 patch onto the skin daily. Remove & Discard patch within 12 hours or as directed by MD Patient not taking: Reported on 10/26/2020 11/08/20   Marnee Guarneri T, NP  mirtazapine (REMERON) 7.5 MG tablet Take 7.5 mg by mouth at bedtime. 08/31/20   [provider]  rOPINIRole (REQUIP) 0.5 MG tablet Take one tablet (0.5 MG) by mouth in the morning and then take two tablets (1 MG) by mouth at night before bedtime. Patient taking differently: Take 0.5-1 mg by mouth See admin instructions. Take one tablet (0.5 MG) by mouth in the morning and then take two tablets (1 MG) by mouth at night before bedtime. 01/11/20   Cannady, Henrine Screws T, NP  tiZANidine (ZANAFLEX) 2 MG tablet Take 1 tablet (2 mg total) by mouth daily as needed for muscle spasms. 11/12/2020   Venita Lick, NP     Critical care time: 35 minutes      Rosilyn Mings, AGNP  Pulmonary/Critical Care Pager [phone] (please enter 7 digits) PCCM Consult Pager [phone] (please enter 7 digits)

## 2020-11-13 NOTE — H&P (Signed)
$'@LOGO'L$ @   MRN : [id number]  Marissa Hess is a 86 y.o. (1935/01/16) female who presents with chief complaint of c/o abdominal pain.  History of Present Illness:   The patient presents to the [facility] regional emergency room with increasing abdominal pain. The aneurysm was found by CT scan after her initial evaluation.  At that time a pulsatile abdominal mass was obtained.  Interestingly, the pain had been going on for several days.  No history of an acute onset of painful blue discoloration of the toes.     No family history of AAA.   Patient denies amaurosis fugax or TIA symptoms. There is no history of claudication or rest pain symptoms of the lower extremities.  The patient denies angina or shortness of breath.  CT scan is reviewed by me personally and shows an infrarenal AAA that measures >5.00 cm with evidence of rupture  No outpatient medications have been marked as taking for the 10/30/2020 encounter [facility] Encounter).    Past Medical History:  Diagnosis Date   Allergy    Arthritis    Chronic kidney disease    CKD V   GERD (gastroesophageal reflux disease)    Hypertension    Lupus ([place]) 2002   Macular degeneration of left eye    Myocardial infarction [facility]) 2007   Osteoporosis     Past Surgical History:  Procedure Laterality Date   A/V FISTULAGRAM Right 01/16/2018   Procedure: A/V FISTULAGRAM;  Surgeon: [name], MD;  Location: [place] CV LAB;  Service: Cardiovascular;  Laterality: Right;   ABDOMINAL HYSTERECTOMY     AV FISTULA PLACEMENT Right 12/17/2017   Procedure: ARTERIOVENOUS (AV) FISTULA CREATION ( BRACHIO CEPHALIC POSS. BRACHIO BASILIC );  Surgeon: Algernon Huxley, MD;  Location: [facility] ORS;  Service: Vascular;  Laterality: Right;   CARDIOVERSION N/A 10/04/2020   Procedure: CARDIOVERSION;  Surgeon: Kate Sable, MD;  Location: [facility] ORS;  Service: Cardiovascular;  Laterality: N/A;   CATARACT EXTRACTION W/PHACO Right 01/03/2016   Procedure: CATARACT  EXTRACTION PHACO AND INTRAOCULAR LENS PLACEMENT ([place]);  Surgeon: Leandrew Koyanagi, MD;  Location: [place];  Service: Ophthalmology;  Laterality: Right;   COLON SURGERY     intestine became twisted after hernia sugery   EYE SURGERY     cataract   HERNIA REPAIR     TEMPORAL ARTERY BIOPSY / LIGATION     TONSILLECTOMY      Social History Social History   Tobacco Use   Smoking status: Former    Types: Cigarettes    Start date: 09/11/1984    Quit date: 09/26/1994    Years since quitting: 26.1   Smokeless tobacco: Never  Vaping Use   Vaping Use: Never used  Substance Use Topics   Alcohol use: No    Alcohol/week: 0.0 standard drinks   Drug use: No    Family History Family History  Problem Relation Age of Onset   Stomach cancer Mother    Rheum arthritis Father    Thyroid disease Daughter    Varicose Veins Maternal Aunt    Breast cancer Neg Hx     Allergies  Allergen Reactions   Codeine Itching   Ibuprofen     Avoids "because of lupus"   Sulfa Antibiotics Diarrhea   Vioxx [Rofecoxib]     Avoids "because of lupus"   Celebrex [Celecoxib] Rash    Avoids "because of lupus"   Penicillins Rash    Reaction: over 30 years ago  REVIEW OF SYSTEMS (Negative unless checked)  Constitutional: '[]'$ Weight loss  '[]'$ Fever  '[]'$ Chills Cardiac: '[]'$ Chest pain   '[]'$ Chest pressure   '[]'$ Palpitations   '[]'$ Shortness of breath when laying flat   '[]'$ Shortness of breath with exertion. Vascular:  '[]'$ Pain in legs with walking   '[]'$ Pain in legs at rest  '[]'$ History of DVT   '[]'$ Phlebitis   '[]'$ Swelling in legs   '[]'$ Varicose veins   '[]'$ Non-healing ulcers Pulmonary:   '[]'$ Uses home oxygen   '[]'$ Productive cough   '[]'$ Hemoptysis   '[]'$ Wheeze  '[]'$ COPD   '[]'$ Asthma Neurologic:  '[]'$ Dizziness   '[]'$ Seizures   '[]'$ History of stroke   '[]'$ History of TIA  '[]'$ Aphasia   '[]'$ Vissual changes   '[]'$ Weakness or numbness in arm   '[]'$ Weakness or numbness in leg Musculoskeletal:   '[]'$ Joint swelling   '[]'$ Joint pain   '[]'$ Low back pain Hematologic:   '[]'$ Easy bruising  '[]'$ Easy bleeding   '[]'$ Hypercoagulable state   '[]'$ Anemic Gastrointestinal:  '[]'$ Diarrhea   '[]'$ Vomiting  '[]'$ Gastroesophageal reflux/heartburn   '[]'$ Difficulty swallowing. Genitourinary:  '[x]'$ Chronic kidney disease   '[]'$ Difficult urination  '[]'$ Frequent urination   '[]'$ Blood in urine Skin:  '[]'$ Rashes   '[]'$ Ulcers  Psychological:  '[]'$ History of anxiety   '[]'$  History of major depression.  Physical Examination  Vitals:   11/05/2020 1000 11/10/2020 1011 11/12/2020 1030 11/07/2020 1040  BP: (!) 176/90 (!) 146/74 131/73 125/76  Pulse:  72 70   Resp: (!) 21 16 (!) 32 18  Temp:      TempSrc:      SpO2:  100% 100%    There is no height or weight on file to calculate BMI. Gen: WD/WN, NAD Head: [place]/AT, No temporalis wasting.  Ear/Nose/Throat: Hearing grossly intact, nares w/o erythema or drainage Eyes: PER, EOMI, sclera nonicteric.  Neck: Supple, no masses.  No bruit or JVD.  Pulmonary:  Good air movement, no audible wheezing, no use of accessory muscles.  Cardiac: RRR, normal S1, S2, no Murmurs. Vascular:   Right arm brachiocephalic fistula moderately aneurysmal mildly pulsatile good thrill.  Popliteal artery pulses are not broadened today Vessel Right Left  Radial Palpable Palpable  Popliteal Palpable Palpable  PT Not palpable Not palpable  DP Trace palpable Trace palpable  Gastrointestinal: soft, non-distended. No guarding/no peritoneal signs.  Musculoskeletal: M/S 5/5 throughout.  No visible deformity.  Neurologic: CN 2-12 intact. Pain and light touch intact in extremities.  Symmetrical.  Speech is fluent. Motor exam as listed above. Psychiatric: Judgment intact, Mood & affect appropriate for pt's clinical situation. Dermatologic: No rashes or ulcers noted.  No changes consistent with cellulitis.   CBC Lab Results  Component Value Date   WBC 5.4 10/26/2020   HGB 11.5 (L) 10/21/2020   HCT 35.0 (L) 11/14/2020   MCV 90.2 10/23/2020   PLT 197 10/17/2020    BMET    Component Value Date/Time    NA 135 11/01/2020 0615   NA 140 09/06/2020 1035   K 4.3 11/12/2020 0615   CL 102 11/03/2020 0615   CO2 22 11/15/2020 0615   GLUCOSE 78 10/16/2020 0615   BUN 28 (H) 10/18/2020 0615   BUN 12 09/06/2020 1035   CREATININE 3.96 (H) 11/07/2020 0615   CALCIUM 8.9 10/26/2020 0615   GFRNONAA 11 (L) 11/01/2020 0615   GFRAA 20 (L) 11/26/2018 1346   Estimated Creatinine Clearance: 8.7 mL/min (A) (by C-G formula based on SCr of 3.96 mg/dL (H)).  COAG Lab Results  Component Value Date   INR 1.2 11/14/2020   INR 0.95 12/11/2017  Radiology CT ABDOMEN PELVIS WO CONTRAST  Result Date: 11/12/2020 CLINICAL DATA:  Lower back and abdominal pain for 2 weeks, diarrhea since Saturday, vomiting since yesterday, suspected diverticulitis, history end-stage renal disease on dialysis EXAM: CT ABDOMEN AND PELVIS WITHOUT CONTRAST TECHNIQUE: Multidetector CT imaging of the abdomen and pelvis was performed following the standard protocol without IV contrast. Sagittal and coronal MPR images reconstructed from axial data set. No oral contrast administered. COMPARISON:  None FINDINGS: Lower chest: Subsegmental atelectasis LEFT lower lobe. Tiny RIGHT pleural effusion. Hepatobiliary: Small calcified gallstones dependently in gallbladder. Liver unremarkable. Pancreas: Normal appearance Spleen: Normal appearance Adrenals/Urinary Tract: Atrophic kidneys. Few tiny renal cysts. No urinary tract calcification or dilatation. Bladder unremarkable. Stomach/Bowel: Diverticulosis of descending and sigmoid colon without evidence of diverticulitis. Appendix not visualized. Stomach and bowel loops otherwise unremarkable for technique. Vascular/Lymphatic: Extensive atherosclerotic calcifications aorta, iliac arteries, minimally femoral arteries. Tortuous abdominal aorta. Fusiform aneurysmal dilatation of mid to distal abdominal aorta measuring 7.3 x 6.3 cm in greatest axial dimensions and extending 6.4 cm length. High attenuation intramural  hematoma identified. Perianeurysmal fat planes demonstrate minimal infiltration highly suspicious for aneurysmal leak. Aneurysmal dilatation extends into the aortic bifurcation with dilatation of the common iliac arteries bilaterally 20 mm RIGHT and 17 mm LEFT. Aneurysmal dilatation of RIGHT internal iliac artery 26 mm diameter. No adenopathy. Reproductive: Uterus surgically absent.  Ovaries not visualized. Other: Small amount of free fluid in pelvis and perisplenic. No free air. LEFT inguinal hernia containing fat. Prior ventral hernia repair. Nonspecific stranding of presacral fat and scattered subcutaneous stranding, question related to patient's end-stage renal disease, missed dialysis this morning. Musculoskeletal: Osseous demineralization. IMPRESSION: Fusiform aneurysmal dilatation of the mid to distal abdominal aorta measuring 7.3 x 6.3 cm in greatest axial dimensions and extending 6.4 cm length, associated with high attenuation acute intramural hematoma and minimal infiltration of fat planes highly suspicious for aneurysmal leak. These findings were emergently called to Dr. Quentin Cornwall on 10/19/2020 at 0920 hours, prior to dictation of this report. Aneurysmal dilatation of common iliac arteries bilaterally, and RIGHT internal iliac artery to 26 mm diameter, without evidence of hemorrhage/leak. Distal colonic diverticulosis without evidence of diverticulitis. Cholelithiasis. Small amount of nonspecific free intraperitoneal fluid. LEFT inguinal hernia containing fat. Aortic Atherosclerosis (ICD10-I70.0). Critical Value/emergent results were called by telephone at the time of interpretation on 11/02/2020 at 9:30 am to provider Merlyn Lot , who verbally acknowledged these results. Electronically Signed   By: Lavonia Dana M.D.   On: 11/03/2020 09:31     Assessment/Plan Ruptured abdominal aortic aneurysm: Patient requires emergent repair.  By review of her CT scan she is an endovascular candidate.  Risks and  benefits of been reviewed all questions have been answered patient is agreement with proceeding with stent graft placement as a lifesaving intervention. End-stage renal disease on hemodialysis: At the present time the patient has adequate dialysis access.  Continue h his modialysis as ordered without interruption.  Avoid nephrotoxic medications and dehydration.  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  The patient does not voice lifestyle limiting changes at this point in time.  Noninvasive studies do not suggest clinically significant change.  No invasive studies, angiography or surgery at this time The patient should continue walking and begin a more formal exercise program.  The patient should continue antiplatelet therapy and aggressive treatment of the lipid abnormalities  No changes in the patient's  medications at this time  We will continue to monitor her lower extremity status postrepair. 4.  Hyperlipidemia: Continue statin as ordered and reviewed, no changes at this time    Hortencia Pilar, MD  11/05/2020 11:34 AM

## 2020-11-14 ENCOUNTER — Encounter: Payer: Self-pay | Admitting: Vascular Surgery

## 2020-11-14 ENCOUNTER — Telehealth: Payer: Medicare Other

## 2020-11-14 DIAGNOSIS — I713 Abdominal aortic aneurysm, ruptured: Secondary | ICD-10-CM | POA: Diagnosis not present

## 2020-11-14 DIAGNOSIS — I714 Abdominal aortic aneurysm, without rupture: Secondary | ICD-10-CM | POA: Diagnosis not present

## 2020-11-14 LAB — CBC
HCT: 31.1 % — ABNORMAL LOW (ref 36.0–46.0)
Hemoglobin: 10.3 g/dL — ABNORMAL LOW (ref 12.0–15.0)
MCH: 30.4 pg (ref 26.0–34.0)
MCHC: 33.1 g/dL (ref 30.0–36.0)
MCV: 91.7 fL (ref 80.0–100.0)
Platelets: 110 10*3/uL — ABNORMAL LOW (ref 150–400)
RBC: 3.39 MIL/uL — ABNORMAL LOW (ref 3.87–5.11)
RDW: 19.9 % — ABNORMAL HIGH (ref 11.5–15.5)
WBC: 6.7 10*3/uL (ref 4.0–10.5)
nRBC: 0 % (ref 0.0–0.2)

## 2020-11-14 LAB — BASIC METABOLIC PANEL
Anion gap: 12 (ref 5–15)
BUN: 32 mg/dL — ABNORMAL HIGH (ref 8–23)
CO2: 21 mmol/L — ABNORMAL LOW (ref 22–32)
Calcium: 8.3 mg/dL — ABNORMAL LOW (ref 8.9–10.3)
Chloride: 104 mmol/L (ref 98–111)
Creatinine, Ser: 4.39 mg/dL — ABNORMAL HIGH (ref 0.44–1.00)
GFR, Estimated: 9 mL/min — ABNORMAL LOW (ref >60)
Glucose, Bld: 67 mg/dL — ABNORMAL LOW (ref 70–99)
Potassium: 5.2 mmol/L — ABNORMAL HIGH (ref 3.5–5.1)
Sodium: 137 mmol/L (ref 135–145)

## 2020-11-14 LAB — LACTIC ACID, PLASMA
Lactic Acid, Venous: 1.5 mmol/L (ref 0.5–1.9)
Lactic Acid, Venous: 1 mmol/L (ref 0.5–1.9)

## 2020-11-14 LAB — CK: Total CK: 5364 U/L — ABNORMAL HIGH (ref 38–234)

## 2020-11-14 LAB — HEPATITIS B SURFACE ANTIGEN: Hepatitis B Surface Ag: NONREACTIVE

## 2020-11-14 MED ORDER — ACETAMINOPHEN 10 MG/ML IV SOLN
1000.0000 mg | Freq: Four times a day (QID) | INTRAVENOUS | Status: AC
  Administered 2020-11-14 – 2020-11-15 (×4): 1000 mg via INTRAVENOUS
  Filled 2020-11-14 (×4): qty 100

## 2020-11-14 MED ORDER — PROPOFOL 500 MG/50ML IV EMUL
INTRAVENOUS | Status: AC
  Filled 2020-11-14: qty 50

## 2020-11-14 MED ORDER — GLYCOPYRROLATE 0.2 MG/ML IJ SOLN
INTRAMUSCULAR | Status: AC
  Filled 2020-11-14: qty 1

## 2020-11-14 MED ORDER — LIDOCAINE HCL (PF) 2 % IJ SOLN
INTRAMUSCULAR | Status: AC
  Filled 2020-11-14: qty 5

## 2020-11-14 MED ORDER — PROPOFOL 500 MG/50ML IV EMUL
INTRAVENOUS | Status: AC
  Filled 2020-11-14: qty 100

## 2020-11-14 MED ORDER — FENTANYL CITRATE (PF) 100 MCG/2ML IJ SOLN
12.5000 ug | INTRAMUSCULAR | Status: DC | PRN
Start: 2020-11-14 — End: 2020-11-14

## 2020-11-14 MED ORDER — FAMOTIDINE 20 MG IN NS 100 ML IVPB
20.0000 mg | Freq: Every day | INTRAVENOUS | Status: DC
  Administered 2020-11-14 – 2020-11-19 (×5): 20 mg via INTRAVENOUS
  Filled 2020-11-14 (×9): qty 100

## 2020-11-14 NOTE — Progress Notes (Signed)
Marissa Hess, [place] 11/14/20  Subjective:   LOS: 1  Patient presented for Lower abdominal pain, Reported diarrhea and vomiting to ER staff HD on Mon-Fri Dx with AAA with intramural thrombus, Also suspicion for aneurysmal leak Underwent endovascular repair / stent graft placement  Doing fair Sleepy because of pain meds      Objective:  Vital signs in last 24 hours:  Temp:  [96.6 F (35.9 C)-99 F (37.2 C)] 98.4 F (36.9 C) (08/30 0800) Pulse Rate:  [47-99] 72 (08/30 0600) Resp:  [14-33] 22 (08/30 0800) BP: (121-187)/(64-133) 138/64 (08/30 0800) SpO2:  [86 %-100 %] 97 % (08/30 0600) Weight:  [58.2 kg] 58.2 kg (08/29 1500)  Weight change:  Filed Weights   11/01/2020 1500  Weight: 58.2 kg    Intake/Output:    Intake/Output Summary (Last 24 hours) at 11/14/2020 0909 Last data filed at 11/14/2020 0500 Gross per 24 hour  Intake 220.72 ml  Output 295 ml  Net -74.28 ml     Physical Exam: General:  No acute distress, laying in the bed  HEENT  anicteric, moist oral mucous membrane  Pulm/lungs  normal breathing effort, [place] O2  CVS/Heart  regular rhythm, no rub or gallop  Abdomen:   Soft, nontender  Extremities:  No peripheral edema  Neurologic:  Sleepy but arousable, able to follow simple commands  Skin:  No acute rashes  Rt arm AVF    Basic Metabolic Panel:  Recent Labs  Lab 11/08/2020 0615 11/06/2020 1537 11/14/20 0611  NA 135  --  137  K 4.3  --  5.2*  CL 102  --  104  CO2 22  --  21*  GLUCOSE 78  --  67*  BUN 28*  --  32*  CREATININE 3.96*  --  4.39*  CALCIUM 8.9  --  8.3*  MG  --  1.8  --   PHOS  --  6.3*  --       CBC: Recent Labs  Lab 10/18/2020 0615 10/17/2020 1537 11/14/20 0611  WBC 5.4 7.5 6.7  NEUTROABS  --  6.0  --   HGB 11.5* 11.3* 10.3*  HCT 35.0* 35.2* 31.1*  MCV 90.2 89.3 91.7  PLT 197 152 110*      No results found for: HEPBSAG, HEPBSAB, HEPBIGM    Microbiology:  Recent Results (from the past  240 hour(s))  SARS CORONAVIRUS 2 (TAT 6-24 HRS) Nasopharyngeal Nasopharyngeal Swab     Status: None   Collection Time: 10/28/2020  9:46 AM   Specimen: Nasopharyngeal Swab  Result Value Ref Range Status   SARS Coronavirus 2 NEGATIVE NEGATIVE Final    Comment: (NOTE) SARS-CoV-2 target nucleic acids are NOT DETECTED.  The SARS-CoV-2 RNA is generally detectable in upper and lower respiratory specimens during the acute phase of infection. Negative results do not preclude SARS-CoV-2 infection, do not rule out co-infections with other pathogens, and should not be used as the sole basis for treatment or other patient management decisions. Negative results must be combined with clinical observations, patient history, and epidemiological information. The expected result is Negative.  Fact Sheet for Patients: SugarRoll.be  Fact Sheet for Healthcare Providers: https://www.woods-mathews.com/  This test is not yet approved or cleared by the [place] FDA and  has been authorized for detection and/or diagnosis of SARS-CoV-2 by FDA under an Emergency Use Authorization (EUA). This EUA will remain  in effect (meaning this test can be used) for the duration of the COVID-19 declaration under  Se ction 564(b)(1) of the Act, 21 U.S.C. section 360bbb-3(b)(1), unless the authorization is terminated or revoked sooner.  Performed at [facility], [place] [address], [place], [place] [address]   MRSA Next Gen by PCR, Nasal     Status: None   Collection Time: 11/05/2020  3:09 PM   Specimen: Nasal Mucosa; Nasal Swab  Result Value Ref Range Status   MRSA by PCR Next Gen NOT DETECTED NOT DETECTED Final    Comment: (NOTE) The GeneXpert MRSA Assay (FDA approved for NASAL specimens only), is one component of a comprehensive MRSA colonization surveillance program. It is not intended to diagnose MRSA infection nor to guide or monitor treatment for MRSA  infections. Test performance is not FDA approved in patients less than 40 years old. Performed at [facility], [address] [place], [place] [address]     Coagulation Studies: Recent Labs    11/09/2020 0946  LABPROT 15.5*  INR 1.2     Urinalysis: No results for input(s): COLORURINE, LABSPEC, PHURINE, GLUCOSEU, HGBUR, BILIRUBINUR, KETONESUR, PROTEINUR, UROBILINOGEN, NITRITE, LEUKOCYTESUR in the last 72 hours.  Invalid input(s): APPERANCEUR    Imaging: CT ABDOMEN PELVIS WO CONTRAST  Result Date: 10/22/2020 CLINICAL DATA:  Lower back and abdominal pain for 2 weeks, diarrhea since Saturday, vomiting since yesterday, suspected diverticulitis, history end-stage renal disease on dialysis EXAM: CT ABDOMEN AND PELVIS WITHOUT CONTRAST TECHNIQUE: Multidetector CT imaging of the abdomen and pelvis was performed following the standard protocol without IV contrast. Sagittal and coronal MPR images reconstructed from axial data set. No oral contrast administered. COMPARISON:  None FINDINGS: Lower chest: Subsegmental atelectasis LEFT lower lobe. Tiny RIGHT pleural effusion. Hepatobiliary: Small calcified gallstones dependently in gallbladder. Liver unremarkable. Pancreas: Normal appearance Spleen: Normal appearance Adrenals/Urinary Tract: Atrophic kidneys. Few tiny renal cysts. No urinary tract calcification or dilatation. Bladder unremarkable. Stomach/Bowel: Diverticulosis of descending and sigmoid colon without evidence of diverticulitis. Appendix not visualized. Stomach and bowel loops otherwise unremarkable for technique. Vascular/Lymphatic: Extensive atherosclerotic calcifications aorta, iliac arteries, minimally femoral arteries. Tortuous abdominal aorta. Fusiform aneurysmal dilatation of mid to distal abdominal aorta measuring 7.3 x 6.3 cm in greatest axial dimensions and extending 6.4 cm length. High attenuation intramural hematoma identified. Perianeurysmal fat planes demonstrate minimal  infiltration highly suspicious for aneurysmal leak. Aneurysmal dilatation extends into the aortic bifurcation with dilatation of the common iliac arteries bilaterally 20 mm RIGHT and 17 mm LEFT. Aneurysmal dilatation of RIGHT internal iliac artery 26 mm diameter. No adenopathy. Reproductive: Uterus surgically absent.  Ovaries not visualized. Other: Small amount of free fluid in pelvis and perisplenic. No free air. LEFT inguinal hernia containing fat. Prior ventral hernia repair. Nonspecific stranding of presacral fat and scattered subcutaneous stranding, question related to patient's end-stage renal disease, missed dialysis this morning. Musculoskeletal: Osseous demineralization. IMPRESSION: Fusiform aneurysmal dilatation of the mid to distal abdominal aorta measuring 7.3 x 6.3 cm in greatest axial dimensions and extending 6.4 cm length, associated with high attenuation acute intramural hematoma and minimal infiltration of fat planes highly suspicious for aneurysmal leak. These findings were emergently called to Dr. Quentin Cornwall on 11/09/2020 at 0920 hours, prior to dictation of this report. Aneurysmal dilatation of common iliac arteries bilaterally, and RIGHT internal iliac artery to 26 mm diameter, without evidence of hemorrhage/leak. Distal colonic diverticulosis without evidence of diverticulitis. Cholelithiasis. Small amount of nonspecific free intraperitoneal fluid. LEFT inguinal hernia containing fat. Aortic Atherosclerosis (ICD10-I70.0). Critical Value/emergent results were called by telephone at the time of interpretation on 10/19/2020 at  9:30 am to provider Merlyn Lot , who verbally acknowledged these results. Electronically Signed   By: Lavonia Dana M.D.   On: 11/06/2020 09:31   PERIPHERAL VASCULAR CATHETERIZATION  Result Date: 10/29/2020 See surgical note for result.    Medications:    sodium chloride Stopped (10/29/2020 1728)   clindamycin (CLEOCIN) IV 300 mg (11/14/20 0043)   DOPamine      magnesium sulfate bolus IVPB     nitroGLYCERIN      carvedilol  25 mg Oral BID WC   Chlorhexidine Gluconate Cloth  6 each Topical Q0600   docusate sodium  100 mg Oral Daily   famotidine  20 mg Intravenous BID   mirtazapine  7.5 mg Oral QHS   rOPINIRole  0.5 mg Oral q AM   rOPINIRole  1 mg Oral QHS   acetaminophen **OR** acetaminophen, alum & mag hydroxide-simeth, guaiFENesin-dextromethorphan, hydrALAZINE, labetalol, magnesium sulfate bolus IVPB, metoprolol tartrate, morphine injection, ondansetron, oxyCODONE-acetaminophen, phenol, potassium chloride, tiZANidine  Assessment/ Plan:  86 y.o. female with ESRD, h/o lupus nephritis, A flutter, GERD, was admitted on 10/27/2020 for  Active Problems:   Ruptured abdominal aortic aneurysm (AAA) (HCC)   AAA (abdominal aortic aneurysm) (HCC)   Aneurysm, abdominal aortic, with rupture (HCC)  Abdominal aortic aneurysm (AAA) without rupture (HCC) [I71.4] Ruptured abdominal aortic aneurysm (AAA) (HCC) [I71.3] AAA (abdominal aortic aneurysm) ([place]) [I71.4] Aneurysm, abdominal aortic, with rupture ([place]) [I71.3]  #. ESRD HD today   #. Anemia of CKD  Lab Results  Component Value Date   HGB 10.3 (L) 11/14/2020   Low dose EPO with HD for Hgb < 10  #. Secondary hyperparathyroidism of renal origin N 25.81   No results found for: PTH Lab Results  Component Value Date   PHOS 6.3 (H) 11/14/2020   Monitor calcium and phos level during this admission   #.  AAA, contained rupture S/p endovascular repair on 12/14/20   LOS: [place] 8/30/20229:09 AM  [facility] [place], [place]

## 2020-11-14 NOTE — Progress Notes (Signed)
PHARMACY NOTE:  RENAL DOSAGE ADJUSTMENT  Current regimen includes a mismatch between dosage and estimated renal function.  As per policy approved by the Pharmacy & Therapeutics and Medical Executive Committees, the dosage will be adjusted accordingly.  Current famotidine dosage:  20 mg IV BID  Renal Function:  Estimated Creatinine Clearance: 7.9 mL/min (A) (by C-G formula based on SCr of 4.39 mg/dL (H)). '[x]'$      On intermittent HD, scheduled: '[]'$      On CRRT     dosage has been changed to:  famotidine 20 mg IV once daily   Thank you for allowing pharmacy to be a part of this patient's care.  Dallie Piles, [facility] 11/14/2020 9:24 AM

## 2020-11-14 NOTE — Progress Notes (Signed)
NAME:  Marissa Hess, MRN:  [id number], DOB:  1934/05/13, LOS: 1 ADMISSION DATE:  10/29/2020, CONSULTATION DATE: 11/04/2020 REFERRING MD: Dr. Delana Meyer CHIEF COMPLAINT: Ruptured AAA  History of Present Illness:  This is an 86 yo female who presented to [facility] ER on 08/29 with c/o lower back/abdominal pain and watery diarrhea onset of symptoms 2 weeks prior to presentation.  She does have a hx of ESRD on hemodialysis M/F and missed her HD session the morning of 08/29.    ED course: Upon arrival to the ER lab results revealed BUN 28, creatinine 3.96, albumin 3.1, and hgb 11.5.  CT Abd Pelvis revealed a large intrarenal AAA with periaortic edema concerning for small leak.  Pt also hypertensive with sbp 160-180's.  Pt placed on nicardipine gtt for bp control and vascular surgery consulted. Per vascular surgery recommendations Eliquis (hx atrial flutter) reversed with Andexxa and pt transported to the OR for emergent selective angiography, coil embolization, and stent graft placement.  Pt admitted to ICU postop and PCCM team consulted to assist with management.    Pertinent  Medical History  Allergies  Arthritis  ESRD on HD: M/F GERD  HTN Lupus Lupus nephritis  Left Eye Macular Degeneration  MI (2007) Osteoporosis  Atrial Flutter Oropharyngeal Dysphagia    Significant Hospital Events: Including procedures, antibiotic start and stop dates in addition to other pertinent events   08/29: Pt admitted to ICU s/p emergent selective angiography, coil embolization and stent graft placement of AAA, juxtarenal    with contained rupture  08/30: Hemodynamically stable, BP controlled (no IV antihypertensives being required), with ACUTE METABOLIC ENCEPHALOPATHY (? Pain meds vs ICU delirium)   Interim History / Subjective:  -No acute events reported overnight -Afebrile, Hemodynamically stable, BP controlled (currently SBP 130's), NOT requiring Nicardipine gtt -This morning with Encephalopathy (oriented  only to self, moves all extremities purposefully, No focal deficits) ~ did receive a total of 17 mg of morphine overnight  Objective   Blood pressure (!) 142/119, pulse 72, temperature 99 F (37.2 C), temperature source Oral, resp. rate 18, height '5\' 3"'$  (1.6 m), weight 58.2 kg, SpO2 97 %.        Intake/Output Summary (Last 24 hours) at 11/14/2020 0758 Last data filed at 11/14/2020 0500 Gross per 24 hour  Intake 220.72 ml  Output 295 ml  Net -74.28 ml    Filed Weights   11/12/2020 1500  Weight: 58.2 kg    Examination: General: Chronically ill appearing Elderly female resting in bed, sleeping, in  NAD  HENT: Atraumatic, normocephalic, neck supple, no JVD Lungs: clear throughout, even, non labored  Cardiovascular: nsr, rrr, no R/G, 2+ radial/1+ distal palpable pulses, no edema  Abdomen: +BS x4, obese, soft, non tender, non distended  Extremities: moves all extremities, bilateral feet cool to touch  Skin: bilateral groin PAD's in place no bleeding or hematoma present; RUE fistula +bruit and thrill  Neuro: Sleeping, arouses to voice, oriented only to self, moves all extremities purposefully, no focal deficits GU: indwelling foley in place draining small amount of yellow urine   Resolved Hospital Problem list     Assessment & Plan:   AAA, juxtarenal with contained rupture s/p emergent angiography, coil embolization, and stent graft placement  Hypertension  Hx: Atrial flutter/fibrillation  -Vascular Surgery following, appreciate input -Continuous cardiac monitoring -Maintain MAP >65 -Monitor for s/sx of bleeding -Holding outpatient Eliquis -Discussed with Dr. Lucky Cowboy, goal SBP <160 -Continue outpatient carvedilol and prn labetalol for hypertension -Continue Clindamycin for  surgical prophylaxis   ESRD on Hemodialysis  Mild Hyperkalemia -Monitor I&O's / urinary output -Follow BMP -Ensure adequate renal perfusion -Avoid nephrotoxic agents as able -Replace electrolytes as  indicated -Nephrology following, appreciate input -HD as per Nephrology  Anemia of Chronic Disease -Monitor for S/Sx of bleeding -Trend CBC -SCD's for VTE Prophylaxis (no chemical prophylaxis until approved by Vascular Surgery, hold outpatient Eliquis) -Transfuse for Hgb <7  Acute Metabolic Encephalopathy, ? Secondary to pain medications vs. ICU Delirium -Provide supportive care -Promote normal sleep/wake cycle -OOB as able, PT/OT when cleared by Vascular Surgery -Avoid sedating medications as able -Consider ABG -Continue home Remeron & Requip       Best Practice (right click and "Reselect all SmartList Selections" daily)   Diet/type: clear liquids DVT prophylaxis: SCD GI prophylaxis: Pepcid Lines: N/A Foley:  Yes Code Status:  full code Last date of multidisciplinary goals of care discussion [11/14/2020]  Labs   CBC: Recent Labs  Lab 11/04/2020 0615 11/15/2020 1537 11/14/20 0611  WBC 5.4 7.5 6.7  NEUTROABS  --  6.0  --   HGB 11.5* 11.3* 10.3*  HCT 35.0* 35.2* 31.1*  MCV 90.2 89.3 91.7  PLT 197 152 110*     Basic Metabolic Panel: Recent Labs  Lab 11/12/2020 0615 10/27/2020 1537 11/14/20 0611  NA 135  --  137  K 4.3  --  5.2*  CL 102  --  104  CO2 22  --  21*  GLUCOSE 78  --  67*  BUN 28*  --  32*  CREATININE 3.96*  --  4.39*  CALCIUM 8.9  --  8.3*  MG  --  1.8  --   PHOS  --  6.3*  --     GFR: Estimated Creatinine Clearance: 7.9 mL/min (A) (by C-G formula based on SCr of 4.39 mg/dL (H)). Recent Labs  Lab 10/20/2020 0615 11/08/2020 1537 11/14/20 0611  WBC 5.4 7.5 6.7     Liver Function Tests: Recent Labs  Lab 11/04/2020 0615  AST 17  ALT 10  ALKPHOS 47  BILITOT 1.3*  PROT 7.1  ALBUMIN 3.1*    Recent Labs  Lab 11/11/2020 0615  LIPASE 27    No results for input(s): AMMONIA in the last 168 hours.  ABG No results found for: PHART, PCO2ART, PO2ART, HCO3, TCO2, ACIDBASEDEF, O2SAT   Coagulation Profile: Recent Labs  Lab 11/08/2020 0946  INR  1.2     Cardiac Enzymes: No results for input(s): CKTOTAL, CKMB, CKMBINDEX, TROPONINI in the last 168 hours.  HbA1C: No results found for: HGBA1C  CBG: No results for input(s): GLUCAP in the last 168 hours.  Review of Systems: Positives in BOLD   Unable to assess due to AMS  Past Medical History:  She,  has a past medical history of Allergy, Arthritis, Chronic kidney disease, GERD (gastroesophageal reflux disease), Hypertension, Lupus ([place]) (2002), Macular degeneration of left eye, Myocardial infarction ([place]) (2007), and Osteoporosis.   Surgical History:   Past Surgical History:  Procedure Laterality Date   A/V FISTULAGRAM Right 01/16/2018   Procedure: A/V FISTULAGRAM;  Surgeon: [name], MD;  Location: [place] [facility];  Service: Cardiovascular;  Laterality: Right;   ABDOMINAL HYSTERECTOMY     AV FISTULA PLACEMENT Right 12/17/2017   Procedure: ARTERIOVENOUS (AV) FISTULA CREATION ( BRACHIO CEPHALIC POSS. BRACHIO BASILIC );  Surgeon: Algernon Huxley, MD;  Location: [facility];  Service: Vascular;  Laterality: Right;   CARDIOVERSION N/A 10/04/2020   Procedure: CARDIOVERSION;  Surgeon:  Kate Sable, MD;  Location: [facility];  Service: Cardiovascular;  Laterality: N/A;   CATARACT EXTRACTION W/PHACO Right 01/03/2016   Procedure: CATARACT EXTRACTION PHACO AND INTRAOCULAR LENS PLACEMENT (IOC);  Surgeon: Leandrew Koyanagi, MD;  Location: [place];  Service: Ophthalmology;  Laterality: Right;   COLON SURGERY     intestine became twisted after hernia sugery   EYE SURGERY     cataract   HERNIA REPAIR     TEMPORAL ARTERY BIOPSY / LIGATION     TONSILLECTOMY       Social History:   reports that she quit smoking about 26 years ago. Her smoking use included cigarettes. She started smoking about 36 years ago. She has never used smokeless tobacco. She reports that she does not drink alcohol and does not use drugs.   Family History:  Her family history includes Rheum  arthritis in her father; Stomach cancer in her mother; Thyroid disease in her daughter; Varicose Veins in her maternal aunt. There is no history of Breast cancer.   Allergies Allergies  Allergen Reactions   Codeine Itching   Ibuprofen     Avoids "because of lupus"   Sulfa Antibiotics Diarrhea   Vioxx [Rofecoxib]     Avoids "because of lupus"   Celebrex [Celecoxib] Rash    Avoids "because of lupus"   Penicillins Rash    Reaction: over 30 years ago     Home Medications  Prior to Admission medications   Medication Sig Start Date End Date Taking? Authorizing Provider  apixaban (ELIQUIS) 2.5 MG TABS tablet Take 1 tablet (2.5 mg total) by mouth 2 (two) times daily. 09/06/20   Cannady, Henrine Screws T, NP  carvedilol (COREG) 25 MG tablet Take 1 tablet (25 mg total) by mouth 2 (two) times daily with a meal. 11/12/19   Cannady, Jolene T, NP  cholecalciferol (VITAMIN D) 1000 units tablet Take 1,000 Units by mouth daily.     [provider]  ferrous sulfate (SLOW FE) 160 (50 Fe) MG TBCR SR tablet Take 160 mg by mouth daily.    [provider]  folic acid (FOLVITE) 1 MG tablet Take 1 tablet (1 mg total) by mouth daily. 12/09/19   Johnson, Megan P, DO  lidocaine (LIDODERM) 5 % Place 1 patch onto the skin daily. Remove & Discard patch within 12 hours or as directed by MD Patient not taking: Reported on 10/23/2020 11/08/20   Marnee Guarneri T, NP  mirtazapine (REMERON) 7.5 MG tablet Take 7.5 mg by mouth at bedtime. 08/31/20   [provider]  rOPINIRole (REQUIP) 0.5 MG tablet Take one tablet (0.5 MG) by mouth in the morning and then take two tablets (1 MG) by mouth at night before bedtime. Patient taking differently: Take 0.5-1 mg by mouth See admin instructions. Take one tablet (0.5 MG) by mouth in the morning and then take two tablets (1 MG) by mouth at night before bedtime. 01/11/20   Cannady, Henrine Screws T, NP  tiZANidine (ZANAFLEX) 2 MG tablet Take 1 tablet (2 mg total) by mouth daily as  needed for muscle spasms. 11/12/2020   Venita Lick, NP     Critical care time: 40 minutes      Darel Hong, AGACNP-BC [facility] Prefer epic messenger for cross cover needs If after hours, please call E-link

## 2020-11-14 NOTE — Progress Notes (Addendum)
GOALS OF CARE DISCUSSION  The Clinical status was relayed to family in detail. Updated and notified of patients medical condition.  The  family(daughter ) had questions about  her surgery and left leg, patient is moaning and groaning in pain and disoriented. We have stopped all narcotics as per family(daughter) request.   We will prescribe IV tylenol.  I have explained that our role is to support the surgeons and provide pain meds as needed. The daughter has asked that we place morphine as a drug allergy and has asked [place] not to provide any more codeine products. Explained to family course of therapy and the modalities  I have asked VASC team to come by and talk with family and explain the pain in the left leg.   Patient with Progressive multiorgan failure with a very high probablity of a very minimal chance of meaningful recovery despite all aggressive and optimal medical therapy.   I have explained to Daughter, our role is to assist with pain management and that if she does not want any narcotics, then we can only provide tylenol at this time.   PATIENT REMAINS FULL CODE  Family understands the situation.  Family are satisfied with Plan of action and management. All questions answered     Corrin Parker, M.D.  [facility]  Medical Director [place] Director [facility] Cardio-Pulmonary Department

## 2020-11-14 NOTE — Progress Notes (Signed)
Patient completed dialysis treatment as ordered. Patient experience asymptomatic tachycardia, floor nurse administered prescribed medication. Patient tolerated treatment. Report given to floor nurse Daron Offer, RN.

## 2020-11-14 NOTE — Anesthesia Postprocedure Evaluation (Signed)
Anesthesia Post Note  Patient: Marissa Hess  Procedure(s) Performed: ENDOVASCULAR REPAIR/STENT GRAFT  Patient location during evaluation: ICU Anesthesia Type: General Level of consciousness: responds to stimulation (Pt resting comfortably ) Pain management: pain level controlled Vital Signs Assessment: post-procedure vital signs reviewed and stable Respiratory status: spontaneous breathing (Nasal Canula ) Cardiovascular status: stable Postop Assessment: no apparent nausea or vomiting Anesthetic complications: no   No notable events documented.   Last Vitals:  Vitals:   11/14/20 0500 11/14/20 0600  BP: (!) 148/64 (!) 142/119  Pulse: 72 72  Resp: (!) 22 18  Temp:    SpO2: 100% 97%    Last Pain:  Vitals:   11/14/20 0400  TempSrc: Oral  PainSc:                  Caryl Asp

## 2020-11-15 ENCOUNTER — Inpatient Hospital Stay: Payer: Medicare Other

## 2020-11-15 DIAGNOSIS — I713 Abdominal aortic aneurysm, ruptured: Secondary | ICD-10-CM | POA: Diagnosis not present

## 2020-11-15 DIAGNOSIS — N186 End stage renal disease: Secondary | ICD-10-CM | POA: Diagnosis not present

## 2020-11-15 DIAGNOSIS — Z992 Dependence on renal dialysis: Secondary | ICD-10-CM | POA: Diagnosis not present

## 2020-11-15 LAB — URINALYSIS, COMPLETE (UACMP) WITH MICROSCOPIC
Bilirubin Urine: NEGATIVE
Glucose, UA: NEGATIVE mg/dL
Ketones, ur: 5 mg/dL — AB
Nitrite: NEGATIVE
Protein, ur: 100 mg/dL — AB
RBC / HPF: >50 RBC/hpf — ABNORMAL HIGH (ref 0–5)
Specific Gravity, Urine: 1.031 — ABNORMAL HIGH (ref 1.005–1.030)
WBC, UA: >50 WBC/hpf — ABNORMAL HIGH (ref 0–5)
pH: 5 (ref 5.0–8.0)

## 2020-11-15 LAB — TYPE AND SCREEN
ABO/RH(D): O POS
Antibody Screen: NEGATIVE
Unit division: 0
Unit division: 0

## 2020-11-15 LAB — BPAM RBC
Blood Product Expiration Date: 202209302359
Blood Product Expiration Date: 202209302359
Unit Type and Rh: 5100
Unit Type and Rh: 5100

## 2020-11-15 LAB — PREPARE RBC (CROSSMATCH)

## 2020-11-15 LAB — RENAL FUNCTION PANEL
Albumin: 2.4 g/dL — ABNORMAL LOW (ref 3.5–5.0)
Anion gap: 13 (ref 5–15)
BUN: 22 mg/dL (ref 8–23)
CO2: 24 mmol/L (ref 22–32)
Calcium: 7.7 mg/dL — ABNORMAL LOW (ref 8.9–10.3)
Chloride: 98 mmol/L (ref 98–111)
Creatinine, Ser: 3.01 mg/dL — ABNORMAL HIGH (ref 0.44–1.00)
GFR, Estimated: 15 mL/min — ABNORMAL LOW (ref >60)
Glucose, Bld: 57 mg/dL — ABNORMAL LOW (ref 70–99)
Phosphorus: 6.5 mg/dL — ABNORMAL HIGH (ref 2.5–4.6)
Potassium: 4.2 mmol/L (ref 3.5–5.1)
Sodium: 135 mmol/L (ref 135–145)

## 2020-11-15 LAB — CBC
HCT: 28.9 % — ABNORMAL LOW (ref 36.0–46.0)
Hemoglobin: 9.4 g/dL — ABNORMAL LOW (ref 12.0–15.0)
MCH: 28.7 pg (ref 26.0–34.0)
MCHC: 32.5 g/dL (ref 30.0–36.0)
MCV: 88.4 fL (ref 80.0–100.0)
Platelets: 82 10*3/uL — ABNORMAL LOW (ref 150–400)
RBC: 3.27 MIL/uL — ABNORMAL LOW (ref 3.87–5.11)
RDW: 19.3 % — ABNORMAL HIGH (ref 11.5–15.5)
WBC: 5.9 10*3/uL (ref 4.0–10.5)
nRBC: 0 % (ref 0.0–0.2)

## 2020-11-15 LAB — PROTIME-INR
INR: 1.3 — ABNORMAL HIGH (ref 0.8–1.2)
Prothrombin Time: 16.6 seconds — ABNORMAL HIGH (ref 11.4–15.2)

## 2020-11-15 LAB — APTT: aPTT: 47 seconds — ABNORMAL HIGH (ref 24–36)

## 2020-11-15 LAB — PATHOLOGIST SMEAR REVIEW

## 2020-11-15 LAB — GLUCOSE, CAPILLARY
Glucose-Capillary: 71 mg/dL (ref 70–99)
Glucose-Capillary: 70 mg/dL (ref 70–99)

## 2020-11-15 LAB — D-DIMER, QUANTITATIVE: D-Dimer, Quant: 14.27 ug/mL-FEU — ABNORMAL HIGH (ref 0.00–0.50)

## 2020-11-15 LAB — FIBRINOGEN: Fibrinogen: 380 mg/dL (ref 210–475)

## 2020-11-15 LAB — HEPATITIS B SURFACE ANTIBODY, QUANTITATIVE: Hep B S AB Quant (Post): 60.4 m[IU]/mL (ref >9.9)

## 2020-11-15 MED ORDER — MIDAZOLAM HCL 2 MG/2ML IJ SOLN
1.0000 mg | Freq: Once | INTRAMUSCULAR | Status: DC
  Filled 2020-11-15: qty 2

## 2020-11-15 MED ORDER — ROPINIROLE HCL 1 MG PO TABS
1.0000 mg | ORAL_TABLET | Freq: Every day | ORAL | Status: DC
  Administered 2020-11-15: 1 mg via ORAL
  Filled 2020-11-15 (×5): qty 1

## 2020-11-15 MED ORDER — CEFAZOLIN SODIUM-DEXTROSE 1-4 GM/50ML-% IV SOLN
1.0000 g | INTRAVENOUS | Status: AC
  Filled 2020-11-15: qty 50

## 2020-11-15 MED ORDER — MIDODRINE HCL 5 MG PO TABS: 10.0000 mg | ORAL_TABLET | Freq: Three times a day (TID) | ORAL | Status: DC

## 2020-11-15 MED ORDER — MIDODRINE HCL 5 MG PO TABS: 5.0000 mg | ORAL_TABLET | Freq: Three times a day (TID) | ORAL | Status: DC

## 2020-11-15 MED ORDER — ACETAMINOPHEN 10 MG/ML IV SOLN
1000.0000 mg | Freq: Four times a day (QID) | INTRAVENOUS | Status: AC
  Administered 2020-11-15 – 2020-11-16 (×4): 1000 mg via INTRAVENOUS
  Filled 2020-11-15 (×4): qty 100

## 2020-11-15 MED ORDER — IOHEXOL 350 MG/ML SOLN
125.0000 mL | Freq: Once | INTRAVENOUS | Status: AC | PRN
  Administered 2020-11-15: 125 mL via INTRAVENOUS

## 2020-11-15 MED ORDER — DEXTROSE 5 % IV SOLN: INTRAVENOUS | Status: DC

## 2020-11-15 MED ORDER — EPOETIN ALFA 10000 UNIT/ML IJ SOLN: 4000.0000 [IU] | INTRAMUSCULAR | Status: DC

## 2020-11-15 MED ORDER — SODIUM CHLORIDE 0.9 % IV SOLN
1.0000 g | INTRAVENOUS | Status: DC
  Administered 2020-11-15 – 2020-11-19 (×5): 1 g via INTRAVENOUS
  Filled 2020-11-15 (×2): qty 1
  Filled 2020-11-15: qty 10
  Filled 2020-11-15 (×2): qty 1

## 2020-11-15 MED ORDER — ROPINIROLE HCL 1 MG PO TABS
1.0000 mg | ORAL_TABLET | Freq: Every day | ORAL | Status: DC
  Filled 2020-11-15: qty 1

## 2020-11-15 NOTE — Progress Notes (Signed)
NAME:  Marissa Hess, MRN:  [id number], DOB:  [birth year], LOS: 2 ADMISSION DATE:  10/18/2020, CONSULTATION DATE: 11/04/2020 REFERRING MD: Dr. Delana Meyer CHIEF COMPLAINT: Ruptured AAA  History of Present Illness:  This is an 86 yo female who presented to [facility] ER on 08/29 with c/o lower back/abdominal pain and watery diarrhea onset of symptoms 2 weeks prior to presentation.  She does have a hx of ESRD on hemodialysis M/F and missed her HD session the morning of 08/29.    ED course: Upon arrival to the ER lab results revealed BUN 28, creatinine 3.96, albumin 3.1, and hgb 11.5.  CT Abd Pelvis revealed a large intrarenal AAA with periaortic edema concerning for small leak.  Pt also hypertensive with sbp 160-180's.  Pt placed on nicardipine gtt for bp control and vascular surgery consulted. Per vascular surgery recommendations Eliquis (hx atrial flutter) reversed with Andexxa and pt transported to the OR for emergent selective angiography, coil embolization, and stent graft placement.  Pt admitted to ICU postop and PCCM team consulted to assist with management.    Pertinent  Medical History  Allergies  Arthritis  ESRD on HD: M/F GERD  HTN Lupus Lupus nephritis  Left Eye Macular Degeneration  MI (2007) Osteoporosis  Atrial Flutter Oropharyngeal Dysphagia    Significant Hospital Events: Including procedures, antibiotic start and stop dates in addition to other pertinent events   08/29: Pt admitted to ICU s/p emergent selective angiography, coil embolization and stent graft placement of AAA, juxtarenal    with contained rupture  08/30: Hemodynamically stable, BP controlled (no IV antihypertensives being required), with ACUTE METABOLIC ENCEPHALOPATHY (? Pain meds vs ICU delirium) 8/31: Obtaining CT Head and UA for AMS, CTA abdomen & pelvis with runoff due to LLE pain;  with worsening Thrombocytopenia, obtaining workup  Interim History / Subjective:  -Daughter at bedside this morning, pt is less  restless/uncomfortable than yesterday ~ daughter feels that combination of Tylenol IV + Requip was helpful -Remains altered (oriented only to self), but follows commands and no focal deficits ~ will obtain CT Head and Urinalysis -Continues to complain of LLE pain ~ discussed with Vascular Surgery, will obtain CTA Abdomen & Pelvis with runoff -Afebrile, hemodynamically stable, on 2L [place] -Platelets decreased this morning to 82 (previously 152 then 110) ~ will obtain workup including Peripheral Smear, bleeding times, Fibrinogen, D-dimer, HIT panel, serotonin release assay  Objective   Blood pressure (!) 112/53, pulse 77, temperature 98 F (36.7 C), temperature source Axillary, resp. rate (!) 24, height '5\' 3"'$  (1.6 m), weight 58.2 kg, SpO2 94 %.        Intake/Output Summary (Last 24 hours) at 11/15/2020 0840 Last data filed at 11/15/2020 0400 Gross per 24 hour  Intake 504.13 ml  Output 90 ml  Net 414.13 ml    Filed Weights   10/31/2020 1500  Weight: 58.2 kg    Examination: General: Acute on Chronically  ill appearing Elderly female resting in bed, sleeping upon arrival to room, intermittently restless HENT: Atraumatic, normocephalic, neck supple, no JVD Lungs: clear throughout, even, non labored  Cardiovascular: nsr, rrr, no R/G, 2+ radial/1+ distal palpable pulses, no edema  Abdomen: +BS x4, obese, soft, non tender, non distended  Extremities: moves all extremities, bilateral feet cool to touch  Skin: bilateral groin PAD's removed yesterday, dressings in place with no bleeding or hematoma present; RUE fistula +bruit and thrill  Neuro: Sleeping, arouses to voice, oriented only to self, moves all extremities purposefully, no focal deficits,  pupils PERRL GU: indwelling foley in place draining small amount of yellow urine   Resolved Hospital Problem list     Assessment & Plan:   AAA, juxtarenal with contained rupture s/p emergent angiography, coil embolization, and stent graft placement   LLE Rest pain Hypertension  Hx: Atrial flutter/fibrillation  -Vascular Surgery following, appreciate input ~ will follow recommendations -Obtain CTA Abdomen & Pelvis with runoff due to LLE rest pain -Continuous cardiac monitoring -Maintain MAP >65 -Monitor for s/sx of bleeding -Holding outpatient Eliquis -Discussed with Dr. Lucky Cowboy, goal SBP <160 -Continue outpatient carvedilol and prn labetalol for hypertension -Completed Clindamycin for surgical prophylaxis   ESRD on Hemodialysis  -Monitor I&O's / urinary output -Follow BMP -Ensure adequate renal perfusion -Avoid nephrotoxic agents as able -Replace electrolytes as indicated -Nephrology following, appreciate input -HD as per Nephrology  Anemia of Chronic Disease Thrombocytopenia -Monitor for S/Sx of bleeding -Trend CBC -SCD's for VTE Prophylaxis (no chemical prophylaxis until approved by Vascular Surgery, hold outpatient Eliquis) -Transfuse for Hgb <7 -Transfuse platelets for platelet count of <50 with active bleeding -Will perform Thrombocytopenia workup: Peripheral smear, bleeding times, Fibrinogen, D-dimer, HIT panel, Serotonin release assay  Acute Metabolic Encephalopathy, ? Secondary to pain vs adverse reaction to morphine vs. ICU Delirium -Provide supportive care -Promote normal sleep/wake cycle -OOB as able, PT/OT when cleared by Vascular Surgery -Avoid sedating medications as able -Continue home Remeron & Requip -Consider ABG -Obtain CT Head -Check Urinalysis  Hypoglycemia -CBG's q4h; Target range of 140 to 180 -Follow ICU Hypo/Hyperglycemia protocol -Encourage PO intake as able -Consider D5 infusion       Pt is critically ill, prognosis is guarded and overall long term prognosis is poor.   Recommend DNR/DNI status.     Best Practice (right click and "Reselect all SmartList Selections" daily)   Diet/type: clear liquids DVT prophylaxis: SCD GI prophylaxis: Pepcid Lines: N/A Foley:  Yes Code Status:   full code Last date of multidisciplinary goals of care discussion [11/15/2020]  Pt's daughter updated at bedside 11/15/20.  All questions answered.  Labs   CBC: Recent Labs  Lab 10/25/2020 0615 10/24/2020 1537 11/14/20 0611 11/15/20 0456  WBC 5.4 7.5 6.7 5.9  NEUTROABS  --  6.0  --   --   HGB 11.5* 11.3* 10.3* 9.4*  HCT 35.0* 35.2* 31.1* 28.9*  MCV 90.2 89.3 91.7 88.4  PLT 197 152 110* 82*     Basic Metabolic Panel: Recent Labs  Lab 10/20/2020 0615 11/08/2020 1537 11/14/20 0611 11/15/20 0456  NA 135  --  137 135  K 4.3  --  5.2* 4.2  CL 102  --  104 98  CO2 22  --  21* 24  GLUCOSE 78  --  67* 57*  BUN 28*  --  32* 22  CREATININE 3.96*  --  4.39* 3.01*  CALCIUM 8.9  --  8.3* 7.7*  MG  --  1.8  --   --   PHOS  --  6.3*  --  6.5*    GFR: Estimated Creatinine Clearance: 11.5 mL/min (A) (by C-G formula based on SCr of 3.01 mg/dL (H)). Recent Labs  Lab 10/25/2020 0615 11/04/2020 1537 11/14/20 0611 11/14/20 1730 11/14/20 2122 11/15/20 0456  WBC 5.4 7.5 6.7  --   --  5.9  LATICACIDVEN  --   --   --  1.5 1.0  --      Liver Function Tests: Recent Labs  Lab 11/15/2020 0615 11/15/20 0456  AST 17  --  ALT 10  --   ALKPHOS 47  --   BILITOT 1.3*  --   PROT 7.1  --   ALBUMIN 3.1* 2.4*    Recent Labs  Lab 10/25/2020 0615  LIPASE 27    No results for input(s): AMMONIA in the last 168 hours.  ABG No results found for: PHART, PCO2ART, PO2ART, HCO3, TCO2, ACIDBASEDEF, O2SAT   Coagulation Profile: Recent Labs  Lab 10/20/2020 0946  INR 1.2     Cardiac Enzymes: Recent Labs  Lab 11/14/20 1730  CKTOTAL 5,364*    HbA1C: No results found for: HGBA1C  CBG: Recent Labs  Lab 11/15/20 0614  GLUCAP 71    Review of Systems: Positives in BOLD   Unable to assess due to AMS  Past Medical History:  She,  has a past medical history of Allergy, Arthritis, Chronic kidney disease, GERD (gastroesophageal reflux disease), Hypertension, Lupus ([place]) (2002), Macular  degeneration of left eye, Myocardial infarction ([place]) (2007), and Osteoporosis.   Surgical History:   Past Surgical History:  Procedure Laterality Date   A/V FISTULAGRAM Right 01/16/2018   Procedure: A/V FISTULAGRAM;  Surgeon: [name], MD;  Location: [place] [facility];  Service: Cardiovascular;  Laterality: Right;   ABDOMINAL HYSTERECTOMY     AV FISTULA PLACEMENT Right 12/17/2017   Procedure: ARTERIOVENOUS (AV) FISTULA CREATION ( BRACHIO CEPHALIC POSS. BRACHIO BASILIC );  Surgeon: Algernon Huxley, MD;  Location: [facility];  Service: Vascular;  Laterality: Right;   CARDIOVERSION N/A 10/04/2020   Procedure: CARDIOVERSION;  Surgeon: Kate Sable, MD;  Location: [facility];  Service: Cardiovascular;  Laterality: N/A;   CATARACT EXTRACTION W/PHACO Right 01/03/2016   Procedure: CATARACT EXTRACTION PHACO AND INTRAOCULAR LENS PLACEMENT ([place]);  Surgeon: Leandrew Koyanagi, MD;  Location: [place];  Service: Ophthalmology;  Laterality: Right;   COLON SURGERY     intestine became twisted after hernia sugery   ENDOVASCULAR REPAIR/STENT GRAFT N/A 11/05/2020   Procedure: ENDOVASCULAR REPAIR/STENT GRAFT;  Surgeon: Katha Cabal, MD;  Location: [place] [facility];  Service: Cardiovascular;  Laterality: N/A;   EYE SURGERY     cataract   HERNIA REPAIR     TEMPORAL ARTERY BIOPSY / LIGATION     TONSILLECTOMY       Social History:   reports that she quit smoking about 26 years ago. Her smoking use included cigarettes. She started smoking about 36 years ago. She has never used smokeless tobacco. She reports that she does not drink alcohol and does not use drugs.   Family History:  Her family history includes Rheum arthritis in her father; Stomach cancer in her mother; Thyroid disease in her daughter; Varicose Veins in her maternal aunt. There is no history of Breast cancer.   Allergies Allergies  Allergen Reactions   Codeine Itching   Ibuprofen     Avoids "because of lupus"    Morphine And Related Other (See Comments)    Confusion/Agitation   Sulfa Antibiotics Diarrhea   Vioxx [Rofecoxib]     Avoids "because of lupus"   Celebrex [Celecoxib] Rash    Avoids "because of lupus"   Penicillins Rash    Reaction: over 30 years ago     Home Medications  Prior to Admission medications   Medication Sig Start Date End Date Taking? Authorizing Provider  apixaban (ELIQUIS) 2.5 MG TABS tablet Take 1 tablet (2.5 mg total) by mouth 2 (two) times daily. 09/06/20   Cannady, Henrine Screws T, NP  carvedilol (COREG) 25 MG tablet  Take 1 tablet (25 mg total) by mouth 2 (two) times daily with a meal. 11/12/19   Cannady, Henrine Screws T, NP  cholecalciferol (VITAMIN D) 1000 units tablet Take 1,000 Units by mouth daily.     [provider]  ferrous sulfate (SLOW FE) 160 (50 Fe) MG TBCR SR tablet Take 160 mg by mouth daily.    [provider]  folic acid (FOLVITE) 1 MG tablet Take 1 tablet (1 mg total) by mouth daily. 12/09/19   Johnson, Megan P, DO  lidocaine (LIDODERM) 5 % Place 1 patch onto the skin daily. Remove & Discard patch within 12 hours or as directed by MD Patient not taking: Reported on 10/26/2020 11/08/20   Marnee Guarneri T, NP  mirtazapine (REMERON) 7.5 MG tablet Take 7.5 mg by mouth at bedtime. 08/31/20   [provider]  rOPINIRole (REQUIP) 0.5 MG tablet Take one tablet (0.5 MG) by mouth in the morning and then take two tablets (1 MG) by mouth at night before bedtime. Patient taking differently: Take 0.5-1 mg by mouth See admin instructions. Take one tablet (0.5 MG) by mouth in the morning and then take two tablets (1 MG) by mouth at night before bedtime. 01/11/20   Cannady, Henrine Screws T, NP  tiZANidine (ZANAFLEX) 2 MG tablet Take 1 tablet (2 mg total) by mouth daily as needed for muscle spasms. 10/19/2020   Venita Lick, NP     Critical care time: 40 minutes      Darel Hong, AGACNP-BC [facility] Prefer epic messenger for cross  cover needs If after hours, please call E-link

## 2020-11-15 NOTE — Progress Notes (Signed)
[facility] Daily Progress Note   11/02/2020: Ruptured AAA repair  Subjective: Patient with altered mental status however does not seem to be agitated at this point in time.  Family member at bedside.  No acute issues overnight.  Objective: Vitals:   11/15/20 1345 11/15/20 1400 11/15/20 1500 11/15/20 1505  BP: (!) 88/63   101/61  Pulse: 99 (!) 123 (!) 115   Resp: (!) 21 (!) 36 (!) 35 20  Temp:      TempSrc:      SpO2: 100% 100% 100%   Weight:      Height:        Intake/Output Summary (Last 24 hours) at 11/15/2020 1539 Last data filed at 11/15/2020 1500 Gross per 24 hour  Intake 778.87 ml  Output 90 ml  Net 688.87 ml   Physical Exam: Altered mental status, NAD CV: RRR Pulmonary: CTA Bilaterally Abdomen: Soft, Nontender, Nondistended Vascular:  Right lower extremity: Thigh soft.  Calf soft.  Hard to palpate pedal pulses.  Extremity is warm however transitions at the ankle and becomes cooler.  There is mottling noted to the underside of the feet.  Left lower extremity: Thigh soft.  Calf soft.  Hard to palpate pedal pulses.  Extremity is warm however transitions at the ankle and becomes cooler.  There is mottling noted to the underside of the feet.   Laboratory: CBC    Component Value Date/Time   WBC 5.9 11/15/2020 0456   HGB 9.4 (L) 11/15/2020 0456   HGB 10.9 (L) 09/06/2020 1035   HCT 28.9 (L) 11/15/2020 0456   HCT 33.5 (L) 09/06/2020 1035   PLT 82 (L) 11/15/2020 0456   PLT 201 09/06/2020 1035   BMET    Component Value Date/Time   NA 135 11/15/2020 0456   NA 140 09/06/2020 1035   K 4.2 11/15/2020 0456   CL 98 11/15/2020 0456   CO2 24 11/15/2020 0456   GLUCOSE 57 (L) 11/15/2020 0456   BUN 22 11/15/2020 0456   BUN 12 09/06/2020 1035   CREATININE 3.01 (H) 11/15/2020 0456   CALCIUM 7.7 (L) 11/15/2020 0456   GFRNONAA 15 (L) 11/15/2020 0456   GFRAA 20 (L) 11/26/2018 1346   Assessment/Planning: The patient is an 86 year old female with  multiple medical issues who presented to the [facility] emergency department with a ruptured but contained abdominal aortic aneurysm status post repair with a stent graft - POD#2  1) the bottom of the patient's bilateral feet are mottled.  There is a transition point at the bilateral ankles in which the foot does become cooler.  Pending CT of the abdomen and pelvis with distal runoff.  Patient is currently not on any pressors at this time.  They have been weaned. 2) altered mental status: Head CT pending 3) I spoke with the patient's family member who was at the bedside this afternoon.  I went over the reason why we were ordering a CT of the head, abdomen and pelvis with distal runoff.  She is in agreement with the plan.  Discussed with Dr. Eber Hong Demareon Coldwell PA-C 11/15/2020 3:39 PM

## 2020-11-15 NOTE — Progress Notes (Signed)
Made contact with patients daughter, Debbrah Alar.([phone])She stated she had concerns about the pain her mother experienced on 10/28/2020'@1830'$ ,she wanted to know about the sequence of events involving the multiple administrations of morphine to her mother and at what time a physician was notified about the pain. Contacted Dr. Delana Meyer. He said he would talk with the family about their concerns. Family had been in waiting room, but when I went out to update them, they were gone.I called and left a HIPPA compliant message "The physician would be contacting you about your concerns.

## 2020-11-15 NOTE — Progress Notes (Signed)
Pt remains with altered mental status, slightly improved today as she is intermittently able to answer very simple questions and follow simple commands.  CT Head obtained today was negative for acute intracranial abnormality.  Concern that etiology of AMS could possibly include anoxic brain injury as pt was severely hypotensive and hypoxic with rupture of AAA and during repair.  Pt's husband and 2 daughters at beside.   Discussed CT Head results and concern for possible anoxic injury that would not show up on CT.  Discussed with Dr. Mortimer Fries and Dr. Delana Meyer as well.  Plan is to continue to follow mental status tonight, and if no improvement by morning, will obtain MRI Brain and consult Neurology. Family is in agreement with plan.  All questions answered, they are appreciative of update.  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  Dr. Delana Meyer was at bedside and discussed with family ~ plan is for Angiogram tomorrow.        Additional Critical Care Time: 20 minutes  Darel Hong, AGACNP-BC [facility] Prefer epic messenger for cross cover needs If after hours, please call E-link

## 2020-11-15 NOTE — Progress Notes (Signed)
Patient's mental status somewhat improved today she was able to answer 1 or 2 questions before she once again became essentially disoriented and noncommunicative.  On examination her feet are unchanged compared to yesterday and the day before.  CT scan of the head does not demonstrate any acute stroke or other acute abnormality which I believe is good news.  Perhaps this supports a brief anoxic injury secondary to hemodynamic instability is during the procedure (although she was per the family pretty normal when she first came to the intensive care unit night of surgery) or it could support that her mental status is still secondary to morphine (although I find it very hard to believe that the morphine she room received almost 48 hours ago is still having any profound effect).  I think the results of the scan and her improvement today support continued supportive care no immediate interventions at this time.  The CT angio shows that the perfusion of her left lower extremity appears to be quite a bit better than the right.  She appears to have tibial runoff at least down to the ankle on the left at which point the study is unable to ascertain perfusion below this level.  On the right she has an occlusion at Hunter's canal or slightly distal to this and then reconstitution of at least the anterior tibial with flow down near the ankle and again the scan is not able to discern perfusion below that level.  Given these findings I have discussed with the family angiography with hope for intervention beginning on the left as this might be some distal lesion that would be amenable to angioplasty since that is really the only option available at that distal location.  I can also proceed with recanalization on the right to see if we could improve perfusion.  Given that she is already on dialysis the contrast exposure is a much lesser issue.  Family including husband and both daughters are in agreement with this plan.  The  risks and benefits were reviewed as well as the need for further sedation and this was discussed in detail.  All are in agreement we will move forward tomorrow with angiography and potential revascularization

## 2020-11-15 NOTE — Telephone Encounter (Signed)
Spoke with patient husband and was notified that patient is in ICU suffered from a aneurysm on Monday. Advised patient husband to keep our office updated.

## 2020-11-15 NOTE — Progress Notes (Signed)
[facility], [place] 11/15/20  Subjective:   LOS: 2  Patient presented for Lower abdominal pain, Reported diarrhea and vomiting to ER staff HD on Mon-Fri Dx with AAA with intramural thrombus, Also suspicion for aneurysmal leak Underwent endovascular repair / stent graft placement  Patient continues to complain of pain in the left leg.  She is getting ready to undergo a CT angiogram of the leg and head. Daughter and other family members are at bedside    Objective:  Vital signs in last 24 hours:  Temp:  [98 F (36.7 C)-100.1 F (37.8 C)] 100.1 F (37.8 C) (08/31 0800) Pulse Rate:  [25-127] 68 (08/31 0800) Resp:  [14-34] 19 (08/31 0800) BP: (102-156)/(46-134) 102/67 (08/31 0800) SpO2:  [54 %-100 %] 100 % (08/31 0800)  Weight change:  Filed Weights   10/31/2020 1500  Weight: 58.2 kg    Intake/Output:    Intake/Output Summary (Last 24 hours) at 11/15/2020 1001 Last data filed at 11/15/2020 0400 Gross per 24 hour  Intake 489.21 ml  Output 90 ml  Net 399.21 ml     Physical Exam: General:  No acute distress, laying in the bed  HEENT  anicteric, moist oral mucous membrane  Pulm/lungs  normal breathing effort, [place] O2  CVS/Heart  regular rhythm, no rub or gallop  Abdomen:   Soft, nontender  Extremities: Trace peripheral edema  Neurologic:  Sleepy but arousable, able to follow simple commands  Skin:  No acute rashes  Rt arm AVF    Basic Metabolic Panel:  Recent Labs  Lab 11/07/2020 0615 10/24/2020 1537 11/14/20 0611 11/15/20 0456  NA 135  --  137 135  K 4.3  --  5.2* 4.2  CL 102  --  104 98  CO2 22  --  21* 24  GLUCOSE 78  --  67* 57*  BUN 28*  --  32* 22  CREATININE 3.96*  --  4.39* 3.01*  CALCIUM 8.9  --  8.3* 7.7*  MG  --  1.8  --   --   PHOS  --  6.3*  --  6.5*      CBC: Recent Labs  Lab 11/08/2020 0615 11/06/2020 1537 11/14/20 0611 11/15/20 0456  WBC 5.4 7.5 6.7 5.9  NEUTROABS  --  6.0  --   --   HGB 11.5* 11.3* 10.3*  9.4*  HCT 35.0* 35.2* 31.1* 28.9*  MCV 90.2 89.3 91.7 88.4  PLT 197 152 110* 82*       Lab Results  Component Value Date   HEPBSAG NON REACTIVE 11/14/2020      Microbiology:  Recent Results (from the past 240 hour(s))  SARS CORONAVIRUS 2 (TAT 6-24 HRS) Nasopharyngeal Nasopharyngeal Swab     Status: None   Collection Time: 11/14/2020  9:46 AM   Specimen: Nasopharyngeal Swab  Result Value Ref Range Status   SARS Coronavirus 2 NEGATIVE NEGATIVE Final    Comment: (NOTE) SARS-CoV-2 target nucleic acids are NOT DETECTED.  The SARS-CoV-2 RNA is generally detectable in upper and lower respiratory specimens during the acute phase of infection. Negative results do not preclude SARS-CoV-2 infection, do not rule out co-infections with other pathogens, and should not be used as the sole basis for treatment or other patient management decisions. Negative results must be combined with clinical observations, patient history, and epidemiological information. The expected result is Negative.  Fact Sheet for Patients: SugarRoll.be  Fact Sheet for Healthcare Providers: https://www.woods-mathews.com/  This test is not yet approved  or cleared by the [place] and  has been authorized for detection and/or diagnosis of SARS-CoV-2 by FDA under an Emergency Use Authorization (EUA). This EUA will remain  in effect (meaning this test can be used) for the duration of the COVID-19 declaration under Se ction 564(b)(1) of the Act, 21 U.S.C. section 360bbb-3(b)(1), unless the authorization is terminated or revoked sooner.  Performed at [facility], [place] [address] [place], [place] [address]   MRSA Next Gen by PCR, Nasal     Status: None   Collection Time: 11/09/2020  3:09 PM   Specimen: Nasal Mucosa; Nasal Swab  Result Value Ref Range Status   MRSA by PCR Next Gen NOT DETECTED NOT DETECTED Final    Comment: (NOTE) The GeneXpert MRSA Assay  (FDA approved for NASAL specimens only), is one component of a comprehensive MRSA colonization surveillance program. It is not intended to diagnose MRSA infection nor to guide or monitor treatment for MRSA infections. Test performance is not FDA approved in patients less than 60 years old. Performed at [facility], [address] [place], [place] [address]     Coagulation Studies: Recent Labs    11/14/2020 0946  LABPROT 15.5*  INR 1.2     Urinalysis: No results for input(s): COLORURINE, LABSPEC, PHURINE, GLUCOSEU, HGBUR, BILIRUBINUR, KETONESUR, PROTEINUR, UROBILINOGEN, NITRITE, LEUKOCYTESUR in the last 72 hours.  Invalid input(s): APPERANCEUR    Imaging: PERIPHERAL VASCULAR CATHETERIZATION  Result Date: 10/30/2020 See surgical note for result.    Medications:    sodium chloride 10 mL/hr at 11/15/20 0400   acetaminophen 1,000 mg (11/15/20 0542)   DOPamine     magnesium sulfate bolus IVPB     nitroGLYCERIN      Chlorhexidine Gluconate Cloth  6 each Topical Q0600   docusate sodium  100 mg Oral Daily   famotidine  20 mg Intravenous Daily   midazolam  1 mg Intravenous Once   rOPINIRole  0.5 mg Oral q AM   rOPINIRole  1 mg Oral QHS   acetaminophen **OR** acetaminophen, alum & mag hydroxide-simeth, guaiFENesin-dextromethorphan, hydrALAZINE, labetalol, magnesium sulfate bolus IVPB, metoprolol tartrate, ondansetron, phenol, potassium chloride  Assessment/ Plan:  86 y.o. female with ESRD, h/o lupus nephritis, A flutter, GERD, was admitted on 11/05/2020 for  Active Problems:   Ruptured abdominal aortic aneurysm (AAA) (HCC)   AAA (abdominal aortic aneurysm) (HCC)   Aneurysm, abdominal aortic, with rupture (HCC)  Abdominal aortic aneurysm (AAA) without rupture (HCC) [I71.4] Ruptured abdominal aortic aneurysm (AAA) (HCC) [I71.3] AAA (abdominal aortic aneurysm) ([place]) [I71.4] Aneurysm, abdominal aortic, with rupture ([place]) [I71.3]  #. ESRD, TTS schedule Plan for  next hemodialysis tomorrow. No volume removed on Tuesday  #. Anemia of CKD  Lab Results  Component Value Date   HGB 9.4 (L) 11/15/2020   Low dose EPO with HD for Hgb < 10  #. Secondary hyperparathyroidism of renal origin N 25.81   No results found for: PTH Lab Results  Component Value Date   PHOS 6.5 (H) 11/15/2020   Monitor calcium and phos level during this admission   #.  AAA, contained rupture S/p endovascular repair on 12/14/20 Plan for CT angiogram today for further evaluation of leg pain.   LOS: [place] 8/31/202210:01 AM  [facility] [place], [place]

## 2020-11-16 ENCOUNTER — Inpatient Hospital Stay: Payer: Medicare Other | Admitting: Anesthesiology

## 2020-11-16 ENCOUNTER — Other Ambulatory Visit (INDEPENDENT_AMBULATORY_CARE_PROVIDER_SITE_OTHER): Payer: Self-pay | Admitting: Vascular Surgery

## 2020-11-16 ENCOUNTER — Encounter: Admission: EM | Disposition: E | Payer: Self-pay | Source: Home / Self Care | Attending: Vascular Surgery

## 2020-11-16 ENCOUNTER — Encounter: Payer: Self-pay | Admitting: Anesthesiology

## 2020-11-16 ENCOUNTER — Telehealth: Payer: Self-pay

## 2020-11-16 ENCOUNTER — Inpatient Hospital Stay: Payer: Medicare Other

## 2020-11-16 DIAGNOSIS — N186 End stage renal disease: Secondary | ICD-10-CM

## 2020-11-16 DIAGNOSIS — R4182 Altered mental status, unspecified: Secondary | ICD-10-CM

## 2020-11-16 DIAGNOSIS — I713 Abdominal aortic aneurysm, ruptured: Secondary | ICD-10-CM | POA: Diagnosis not present

## 2020-11-16 DIAGNOSIS — I714 Abdominal aortic aneurysm, without rupture: Secondary | ICD-10-CM | POA: Diagnosis not present

## 2020-11-16 DIAGNOSIS — I9589 Other hypotension: Secondary | ICD-10-CM

## 2020-11-16 HISTORY — PX: TEMPORARY DIALYSIS CATHETER: CATH118312

## 2020-11-16 LAB — GLUCOSE, CAPILLARY
Glucose-Capillary: 117 mg/dL — ABNORMAL HIGH (ref 70–99)
Glucose-Capillary: 175 mg/dL — ABNORMAL HIGH (ref 70–99)
Glucose-Capillary: 73 mg/dL (ref 70–99)
Glucose-Capillary: 75 mg/dL (ref 70–99)
Glucose-Capillary: 82 mg/dL (ref 70–99)
Glucose-Capillary: 86 mg/dL (ref 70–99)
Glucose-Capillary: 86 mg/dL (ref 70–99)

## 2020-11-16 LAB — RENAL FUNCTION PANEL
Albumin: 2.2 g/dL — ABNORMAL LOW (ref 3.5–5.0)
Albumin: 2.1 g/dL — ABNORMAL LOW (ref 3.5–5.0)
Anion gap: 15 (ref 5–15)
Anion gap: 16 — ABNORMAL HIGH (ref 5–15)
BUN: 32 mg/dL — ABNORMAL HIGH (ref 8–23)
BUN: 39 mg/dL — ABNORMAL HIGH (ref 8–23)
CO2: 21 mmol/L — ABNORMAL LOW (ref 22–32)
CO2: 19 mmol/L — ABNORMAL LOW (ref 22–32)
Calcium: 8 mg/dL — ABNORMAL LOW (ref 8.9–10.3)
Calcium: 7.8 mg/dL — ABNORMAL LOW (ref 8.9–10.3)
Chloride: 92 mmol/L — ABNORMAL LOW (ref 98–111)
Chloride: 93 mmol/L — ABNORMAL LOW (ref 98–111)
Creatinine, Ser: 4.04 mg/dL — ABNORMAL HIGH (ref 0.44–1.00)
Creatinine, Ser: 4.56 mg/dL — ABNORMAL HIGH (ref 0.44–1.00)
GFR, Estimated: 10 mL/min — ABNORMAL LOW (ref >60)
GFR, Estimated: 9 mL/min — ABNORMAL LOW (ref >60)
Glucose, Bld: 91 mg/dL (ref 70–99)
Glucose, Bld: 89 mg/dL (ref 70–99)
Phosphorus: 8.4 mg/dL — ABNORMAL HIGH (ref 2.5–4.6)
Phosphorus: 9.3 mg/dL — ABNORMAL HIGH (ref 2.5–4.6)
Potassium: 5.4 mmol/L — ABNORMAL HIGH (ref 3.5–5.1)
Potassium: 6.2 mmol/L — ABNORMAL HIGH (ref 3.5–5.1)
Sodium: 128 mmol/L — ABNORMAL LOW (ref 135–145)
Sodium: 128 mmol/L — ABNORMAL LOW (ref 135–145)

## 2020-11-16 LAB — LACTIC ACID, PLASMA
Lactic Acid, Venous: 4.2 mmol/L (ref 0.5–1.9)
Lactic Acid, Venous: 2.8 mmol/L (ref 0.5–1.9)
Lactic Acid, Venous: 3.5 mmol/L (ref 0.5–1.9)

## 2020-11-16 LAB — CBC
HCT: 32.4 % — ABNORMAL LOW (ref 36.0–46.0)
Hemoglobin: 10.8 g/dL — ABNORMAL LOW (ref 12.0–15.0)
MCH: 29.8 pg (ref 26.0–34.0)
MCHC: 33.3 g/dL (ref 30.0–36.0)
MCV: 89.3 fL (ref 80.0–100.0)
Platelets: 101 10*3/uL — ABNORMAL LOW (ref 150–400)
RBC: 3.63 MIL/uL — ABNORMAL LOW (ref 3.87–5.11)
RDW: 20.2 % — ABNORMAL HIGH (ref 11.5–15.5)
WBC: 5.8 10*3/uL (ref 4.0–10.5)
nRBC: 0.7 % — ABNORMAL HIGH (ref 0.0–0.2)

## 2020-11-16 LAB — MYOGLOBIN, URINE: Myoglobin, Ur: 8961 ng/mL — ABNORMAL HIGH (ref 0–13)

## 2020-11-16 LAB — HEPARIN INDUCED PLATELET AB (HIT ANTIBODY): Heparin Induced Plt Ab: 0.087 OD (ref 0.000–0.400)

## 2020-11-16 LAB — PROCALCITONIN: Procalcitonin: 15.4 ng/mL

## 2020-11-16 SURGERY — TEMPORARY DIALYSIS CATHETER
Anesthesia: General

## 2020-11-16 SURGERY — LOWER EXTREMITY ANGIOGRAPHY
Anesthesia: Moderate Sedation | Laterality: Bilateral

## 2020-11-16 MED ORDER — NOREPINEPHRINE 16 MG/250ML-% IV SOLN: 0.0000 ug/min | INTRAVENOUS | Status: DC

## 2020-11-16 MED ORDER — INSULIN ASPART 100 UNIT/ML IV SOLN
10.0000 [IU] | Freq: Once | INTRAVENOUS | Status: AC
  Administered 2020-11-16: 10 [IU] via INTRAVENOUS
  Filled 2020-11-16: qty 0.1

## 2020-11-16 MED ORDER — SODIUM CHLORIDE 0.9 % IV SOLN
250.0000 mL | INTRAVENOUS | Status: DC
  Administered 2020-11-16 – 2020-11-18 (×2): 250 mL via INTRAVENOUS

## 2020-11-16 MED ORDER — CLINDAMYCIN PHOSPHATE 300 MG/50ML IV SOLN
INTRAVENOUS | Status: AC
  Filled 2020-11-16: qty 50

## 2020-11-16 MED ORDER — SODIUM CHLORIDE 0.9 % IV BOLUS
250.0000 mL | Freq: Once | INTRAVENOUS | Status: AC
  Administered 2020-11-16: 250 mL via INTRAVENOUS

## 2020-11-16 MED ORDER — HYDROMORPHONE HCL 1 MG/ML IJ SOLN: 1.0000 mg | Freq: Once | INTRAMUSCULAR | Status: DC | PRN

## 2020-11-16 MED ORDER — DEXTROSE 50 % IV SOLN
1.0000 | Freq: Once | INTRAVENOUS | Status: AC
  Administered 2020-11-16: 50 mL via INTRAVENOUS
  Filled 2020-11-16: qty 50

## 2020-11-16 MED ORDER — NOREPINEPHRINE 4 MG/250ML-% IV SOLN: 2.0000 ug/min | INTRAVENOUS | Status: DC

## 2020-11-16 MED ORDER — SODIUM CHLORIDE 0.9 % IV SOLN: INTRAVENOUS | Status: DC

## 2020-11-16 MED ORDER — MIDAZOLAM HCL 2 MG/2ML IJ SOLN
INTRAMUSCULAR | Status: AC
  Administered 2020-11-16: 2 mg via INTRAVENOUS
  Filled 2020-11-16: qty 2

## 2020-11-16 MED ORDER — MIDAZOLAM HCL 2 MG/2ML IJ SOLN
2.0000 mg | Freq: Once | INTRAMUSCULAR | Status: AC
  Administered 2020-11-16: 2 mg via INTRAVENOUS
  Filled 2020-11-16: qty 2

## 2020-11-16 MED ORDER — FAMOTIDINE 20 MG PO TABS: 40.0000 mg | ORAL_TABLET | Freq: Once | ORAL | Status: DC | PRN

## 2020-11-16 MED ORDER — MIDAZOLAM HCL 2 MG/ML PO SYRP: 8.0000 mg | ORAL_SOLUTION | Freq: Once | ORAL | Status: DC | PRN

## 2020-11-16 MED ORDER — CLINDAMYCIN PHOSPHATE 300 MG/50ML IV SOLN
300.0000 mg | Freq: Once | INTRAVENOUS | Status: DC
Start: 2020-11-17 — End: 2020-11-16

## 2020-11-16 MED ORDER — EPOETIN ALFA 10000 UNIT/ML IJ SOLN
4000.0000 [IU] | INTRAMUSCULAR | Status: DC
Start: 2020-11-17 — End: 2020-11-20
  Administered 2020-11-17: 4000 [IU] via INTRAVENOUS

## 2020-11-16 MED ORDER — MIDAZOLAM HCL 2 MG/2ML IJ SOLN: 2.0000 mg | Freq: Once | INTRAMUSCULAR | Status: AC

## 2020-11-16 MED ORDER — DEXMEDETOMIDINE HCL IN NACL 400 MCG/100ML IV SOLN
0.4000 ug/kg/h | INTRAVENOUS | Status: DC
  Administered 2020-11-17: 1 ug/kg/h via INTRAVENOUS
  Administered 2020-11-18: 1.2 ug/kg/h via INTRAVENOUS
  Administered 2020-11-18: 1 ug/kg/h via INTRAVENOUS
  Administered 2020-11-19: 0.4 ug/kg/h via INTRAVENOUS
  Filled 2020-11-16 (×4): qty 100

## 2020-11-16 MED ORDER — METHYLPREDNISOLONE SODIUM SUCC 125 MG IJ SOLR: 125.0000 mg | Freq: Once | INTRAMUSCULAR | Status: DC | PRN

## 2020-11-16 MED ORDER — NOREPINEPHRINE 4 MG/250ML-% IV SOLN
2.0000 ug/min | INTRAVENOUS | Status: DC
Start: 2020-11-16 — End: 2020-11-20
  Administered 2020-11-16: 2 ug/min via INTRAVENOUS
  Administered 2020-11-17 – 2020-11-18 (×3): 4 ug/min via INTRAVENOUS
  Administered 2020-11-19: 8 ug/min via INTRAVENOUS
  Administered 2020-11-19: 6 ug/min via INTRAVENOUS
  Filled 2020-11-16 (×7): qty 250

## 2020-11-16 MED ORDER — ONDANSETRON HCL 4 MG/2ML IJ SOLN
4.0000 mg | Freq: Four times a day (QID) | INTRAMUSCULAR | Status: DC | PRN
  Administered 2020-11-17: 4 mg via INTRAVENOUS
  Filled 2020-11-16: qty 2

## 2020-11-16 MED ORDER — DIPHENHYDRAMINE HCL 50 MG/ML IJ SOLN: 50.0000 mg | Freq: Once | INTRAMUSCULAR | Status: DC | PRN

## 2020-11-16 SURGICAL SUPPLY — 1 items: PACK ANGIOGRAPHY (CUSTOM PROCEDURE TRAY) ×3 IMPLANT

## 2020-11-16 NOTE — Op Note (Signed)
  OPERATIVE NOTE   PROCEDURE: Insertion of temporary dialysis catheter catheter right femoral approach.  PRE-OPERATIVE DIAGNOSIS: End-stage renal disease associated with hypotension and mental status changes  POST-OPERATIVE DIAGNOSIS: Same  SURGEON: Katha Cabal M.D.  ANESTHESIA: 1% lidocaine local infiltration  ESTIMATED BLOOD LOSS: Minimal cc  INDICATIONS:   Marissa Hess is a 86 y.o. female who presents with worsening hemodynamics and inability to use her AV fistula as well as poor IV access.  Given that she will likely require pressors she needs appropriate parenteral access and therefore a try Alysis catheter is recommended.  Catheter is being placed on an emergent basis at the request of the ICU staff and physicians.  DESCRIPTION: After obtaining full informed written consent, the patient was positioned supine. The right groin was prepped and draped in a sterile fashion. Ultrasound was placed in a sterile sleeve. Ultrasound was utilized to identify the right common femoral vein which is noted to be echolucent and compressible indicating patency. Images recorded for the permanent record. Under real-time visualization a Seldinger needle is inserted into the vein and the guidewires advanced without difficulty. Small counterincision was made at the wire insertion site. Dilator is passed over the wire and the temporary dialysis catheter catheter is fed over the wire without difficulty.  All lumens aspirate and flush easily and are packed with heparin saline. Catheter secured to the skin of the right thigh with 2-0 silk. A sterile dressing is applied with Biopatch.  COMPLICATIONS: None  CONDITION: Unchanged  Hortencia Pilar Office:  [phone] 12/06/2020, 4:55 PM

## 2020-11-16 NOTE — Anesthesia Preprocedure Evaluation (Addendum)
Anesthesia Evaluation  Patient identified by MRN, date of birth, ID band Patient unresponsive    Reviewed: Allergy & Precautions, H&P , NPO status , Patient's Chart, lab work & pertinent test results, reviewed documented beta blocker date and time   Airway       Comment: Unable to determine Dental  (+) Upper Dentures, Lower Dentures   Pulmonary former smoker,  CT CHEST: Lower chest: Left lower lobe atelectasis/consolidation. Small pleural effusions  Severe Pulmonary Hypertension  [place] o2  + rhonchi        Cardiovascular Exercise Tolerance: Poor hypertension, On Medications + CAD, + Past MI (2007) and + Peripheral Vascular Disease  + dysrhythmias (Left anterior fascicular block, Prolonged QTC. History of Afib with self conversion) + Valvular Problems/Murmurs (TR) AI  Rhythm:regular Rate:Tachycardia  EKG 8/29/202: Sinus rhythm Left anterior fascicular block Anterior infarct, old Prolonged QT interval Baseline wander in lead(s) V4  ECHO 09/2020: IMPRESSIONS  1. Left ventricular ejection fraction, by estimation, is 55 %. The left  ventricle has normal function. The left ventricle has no regional wall  motion abnormalities. Left ventricular diastolic parameters are  indeterminate.  2. Right ventricular systolic function is normal. The right ventricular  size is moderately enlarged. There is severely elevated pulmonary artery  systolic pressure. The estimated right ventricular systolic pressure is  [id number] mmHg.  3. Left atrial size was moderately dilated.  4. The mitral valve is normal in structure. Mild to moderate mitral valve  regurgitation.  5. Tricuspid valve regurgitation is moderate.  6. The aortic valve is normal in structure. Aortic valve regurgitation is  mild.  7. The inferior vena cava is dilated in size with <50% respiratory  variability, suggesting right atrial pressure of 15 mmHg.     Neuro/Psych AMS: Acute Metabolic Encephalopathy, Secondary to UTI vs. pain vs. adverse reaction to morphine vs. ICU Delirium vs. Concern for possible Anoxic Brain Injury  CT Head negative. Pending MRInegative neurological ROS     GI/Hepatic Neg liver ROS, GERD  Medicated,  Endo/Other  Secondary hyperparathyroidism of renal origin  Renal/GU ESRF and DialysisRenal disease (Lupus Nephritis. Missed HD session 8/29, Now on a TTS schedule)   UTI    Musculoskeletal  (+) Arthritis ,   Abdominal Normal abdominal exam  (+)  Abdomen: soft.    Peds  Hematology  (+) Blood dyscrasia (Anemia of CKD), anemia ,   Anesthesia Other Findings Pupils equal and reactive to light. Pt unresponsive to speech  8/29- Presented with AAA with intramural thrombus and is s/p endovascular repair / stent graft placement Severe acidosis probably from limb ischemia Mild hyperkalemia Hyponatremia  Per ICU NOTE: 08/29: Pt admitted to ICU s/p emergent selective angiography, coil embolization and stent graft placement of AAA, juxtarenal    with contained rupture  08/30: Hemodynamically stable, BP controlled (no IV antihypertensives being required), with ACUTE METABOLIC ENCEPHALOPATHY (? Pain meds vs ICU delirium) 8/31: Obtaining CT Head and UA for AMS, CTA abdomen & pelvis with runoff due to LLE pain;  with worsening Thrombocytopenia, obtaining workup 9/1: UA consistent with UTI, placed on Ceftriaxone, plan for MRI and considering Neurology consult, Vascular Surgery planning for Angiogram of LLE   Past Medical History: No date: Allergy No date: Arthritis No date: Chronic kidney disease     Comment:  CKD V No date: GERD (gastroesophageal reflux disease) No date: Hypertension 2002: Lupus ([place]) No date: Macular degeneration of left eye 2007: Myocardial infarction [facility]) No date: Osteoporosis Past Surgical History: 01/16/2018: A/V FISTULAGRAM;  Right     Comment:  Procedure: A/V FISTULAGRAM;  Surgeon:  [name],               MD;  Location: [facility];  Service:               Cardiovascular;  Laterality: Right; No date: ABDOMINAL HYSTERECTOMY 12/17/2017: AV FISTULA PLACEMENT; Right     Comment:  Procedure: ARTERIOVENOUS (AV) FISTULA CREATION ( BRACHIO              CEPHALIC POSS. BRACHIO BASILIC );  Surgeon: Algernon Huxley,              MD;  Location: [facility];  Service: Vascular;  Laterality:              Right; 10/04/2020: CARDIOVERSION; N/A     Comment:  Procedure: CARDIOVERSION;  Surgeon: Kate Sable,               MD;  Location: [facility] OR[facility];  Service: Cardiovascular;                Laterality: N/A; 01/03/2016: CATARACT EXTRACTION W/PHACO; Right     Comment:  Procedure: CATARACT EXTRACTION PHACO AND INTRAOCULAR               LENS PLACEMENT ([place]);  Surgeon: Leandrew Koyanagi, MD;               Location: [facility];  Service: Ophthalmology;                Laterality: Right; No date: COLON SURGERY     Comment:  intestine became twisted after hernia sugery No date: EYE SURGERY     Comment:  cataract No date: HERNIA REPAIR No date: TEMPORAL ARTERY BIOPSY / LIGATION No date: TONSILLECTOMY   Reproductive/Obstetrics negative OB ROS                          Anesthesia Physical  Anesthesia Plan  ASA: 4  Anesthesia Plan: General ETT   Post-op Pain Management:    Induction: Intravenous  PONV Risk Score and Plan: Ondansetron  Airway Management Planned: Oral ETT  Additional Equipment: Arterial line  Intra-op Plan:   Post-operative Plan:   Informed Consent: I have reviewed the patients History and Physical, chart, labs and discussed the procedure including the risks, benefits and alternatives for the proposed anesthesia with the patient or authorized representative who has indicated his/her understanding and acceptance.     Dental Advisory Given and Consent reviewed with POA  Plan Discussed with: CRNA and  Anesthesiologist  Anesthesia Plan Comments: (The risk of death and organ damage were discussed with husband. He understands. )     Anesthesia Quick Evaluation

## 2020-11-16 NOTE — Progress Notes (Signed)
Patient to [facility] for pre-procedure prep. Restless/combative in bed, not following commands, BP MAP in 50s (bolus infusing from ICU transfer). Discussed current concerns with Dr Delana Meyer, plan to return patient to ICU at this time (per MD), ICU nurse notified/report given. Will return patient to ICU assigned room.

## 2020-11-16 NOTE — Telephone Encounter (Signed)
Copied from [place] [id number]. Topic: General - Other >> Nov 16, 2020 12:52 PM Leward Quan A wrote: Reason for CRM: [place] [employer] assistance [employer] called in to report that they have received the Drs portion of the application but that the patients portion is missing and they need this before they can move forward with helping the patient. Plrase advise. Call Alisha at Ph# [phone]

## 2020-11-16 NOTE — TOC Initial Note (Addendum)
Transition of Care [facility]) - Initial/Assessment Note    Patient Details  Name: Marissa Hess MRN: [id number] Date of Birth: Jan 27, 1935  Transition of Care [facility]) CM/SW Contact:    Ova Freshwater Phone Number: [phone] 11/30/2020, 7:34 AM  Clinical Narrative:                  Patient presets to [facility] from home after Lower back pain/lower abdominal pain for about two weeks. Diarrhea since Saturday, vomiting since 11/12/2020. Denies fevers. Pt is M/F dialysis. Patient is more alert/verbalizing pain. CT of head planned for today, 11/15/2020.  Palliative Care consulted. Patient's Main contact Homeyer,Charlie D (Spouse)  [phone] [facility] Phone) or daughter Garen Grams [phone].    Expected Discharge Plan: [place] Barriers to Discharge: Continued Medical Work up   Patient Goals and CMS Choice        Expected Discharge Plan and Services Expected Discharge Plan: [place] In-house Referral: Clinical Social Work   Post Acute Care Choice: [place] arrangements for the past 2 months: [place]                                      Prior Living Arrangements/Services Living arrangements for the past 2 months: Single Family Home Lives with:: Spouse Thersea, Authement D (Spouse)   [phone] (Home Phone)) Patient language and need for interpreter reviewed:: Yes Do you feel safe going back to the place where you live?: Yes      Need for Family Participation in Patient Care: Yes (Comment) Care giver support system in place?: Yes (comment)   Criminal Activity/Legal Involvement Pertinent to Current Situation/Hospitalization: No - Comment as needed  Activities of Daily Living Home Assistive Devices/Equipment: Eyeglasses ADL Screening (condition at time of admission) Patient's cognitive ability adequate to safely complete daily activities?: Yes Is the patient deaf or have difficulty hearing?: No Does the  patient have difficulty seeing, even when wearing glasses/contacts?: No Does the patient have difficulty concentrating, remembering, or making decisions?: No Patient able to express need for assistance with ADLs?: Yes Does the patient have difficulty dressing or bathing?: No Independently performs ADLs?: Yes (appropriate for developmental age) Does the patient have difficulty walking or climbing stairs?: Yes Weakness of Legs: Both Weakness of Arms/Hands: None  Permission Sought/Granted Permission sought to share information with : Facility Sport and exercise psychologist    Share Information with NAME: Nevin, Vereb (Spouse)   [phone] (Home Phone)           Emotional Assessment Appearance:: Appears stated age Attitude/Demeanor/Rapport: Unable to Assess Affect (typically observed): Unable to Assess Orientation: : Fluctuating Orientation (Suspected and/or reported Sundowners) Alcohol / Substance Use: Not Applicable Psych Involvement: No (comment)  Admission diagnosis:  Abdominal aortic aneurysm (AAA) without rupture (HCC) [I71.4] Ruptured abdominal aortic aneurysm (AAA) (HCC) [I71.3] AAA (abdominal aortic aneurysm) ([place]) [I71.4] Aneurysm, abdominal aortic, with rupture ([place]) [I71.3] Patient Active Problem List   Diagnosis Date Noted   Ruptured abdominal aortic aneurysm (AAA) ([place]) 11/10/2020   AAA (abdominal aortic aneurysm) ([place]) 10/24/2020   Aneurysm, abdominal aortic, with rupture ([place]) 11/04/2020   Pulmonary hypertension ([place]) 11/08/2020   Low back pain 11/08/2020   Atrial flutter ([place]) 09/06/2020   Poor mobility 05/25/2019   Dependence on renal dialysis ([place]) 08/20/2018   B12 deficiency 01/26/2018   ESRD (end stage renal disease) ([place]) 01/23/2018   Anemia in  CKD (chronic kidney disease) 10/20/2017   Advanced care planning/counseling discussion 09/25/2016   Gout 10/16/2015   Claudication of both lower extremities ([place]) 10/16/2015   Raynaud's phenomenon without gangrene  04/03/2015   Restless leg syndrome 09/26/2014   Hypertensive nephropathy 08/19/2014   Hyperlipemia 08/19/2014   Allergic rhinitis 08/19/2014   Senile osteoporosis 08/19/2014   Esophageal reflux 08/19/2014   PCP:  Venita Lick, NP Pharmacy:   [place], [place] [address] [address] Phone: [phone] Fax: [place] (N), [place] - [place]) [place] [address] Phone: [phone] Fax: [phone]     Social Determinants of Health (SDOH) Interventions    Readmission Risk Interventions No flowsheet data found.

## 2020-11-16 NOTE — Progress Notes (Signed)
Attempted to place NGT X4 with no success. Pt agitated and unable to follow commands at this time. Md notified. Will continue to assess and attempt again once pt able to tolerate.

## 2020-11-16 NOTE — Progress Notes (Signed)
[facility], [place] 11/24/2020  Subjective:   LOS: 3  Patient presented for Lower abdominal pain, Reported diarrhea and vomiting to ER staff HD on Mon-Fri Dx with AAA with intramural thrombus, Also suspicion for aneurysmal leak Underwent endovascular repair / stent graft placement  Patient continues to complain of pain in the left leg.   Very restless. Family at bedside    Objective:  Vital signs in last 24 hours:  Temp:  [97.9 F (36.6 C)-98 F (36.7 C)] 98 F (36.7 C) (09/01 0000) Pulse Rate:  [30-123] 43 (09/01 0600) Resp:  [15-36] 21 (09/01 0600) BP: (50-164)/(28-126) 90/70 (09/01 0600) SpO2:  [61 %-100 %] 76 % (09/01 0600)  Weight change:  Filed Weights   11/07/2020 1500  Weight: 58.2 kg    Intake/Output:    Intake/Output Summary (Last 24 hours) at 12/05/2020 0923 Last data filed at 12/11/2020 0600 Gross per 24 hour  Intake 1256.7 ml  Output --  Net 1256.7 ml     Physical Exam: General:  No acute distress, laying in the bed  HEENT  anicteric, moist oral mucous membrane  Pulm/lungs  normal breathing effort, [place] O2, mild basilar crackles  CVS/Heart  regular rhythm, no rub or gallop  Abdomen:   Soft, nontender  Extremities: Trace peripheral edema  Neurologic:  Sleepy but arousable,  restless  Skin:  No acute rashes  Rt arm AVF    Basic Metabolic Panel:  Recent Labs  Lab 10/28/2020 0615 11/10/2020 1537 11/14/20 0611 11/15/20 0456 12/02/2020 0519  NA 135  --  137 135 128*  K 4.3  --  5.2* 4.2 5.4*  CL 102  --  104 98 92*  CO2 22  --  21* 24 21*  GLUCOSE 78  --  67* 57* 91  BUN 28*  --  32* 22 32*  CREATININE 3.96*  --  4.39* 3.01* 4.04*  CALCIUM 8.9  --  8.3* 7.7* 8.0*  MG  --  1.8  --   --   --   PHOS  --  6.3*  --  6.5* 8.4*      CBC: Recent Labs  Lab 11/03/2020 0615 10/17/2020 1537 11/14/20 0611 11/15/20 0456 12/12/2020 0519  WBC 5.4 7.5 6.7 5.9 5.8  NEUTROABS  --  6.0  --   --   --   HGB 11.5* 11.3* 10.3* 9.4* 10.8*   HCT 35.0* 35.2* 31.1* 28.9* 32.4*  MCV 90.2 89.3 91.7 88.4 89.3  PLT 197 152 110* 82* 101*       Lab Results  Component Value Date   HEPBSAG NON REACTIVE 11/14/2020      Microbiology:  Recent Results (from the past 240 hour(s))  SARS CORONAVIRUS 2 (TAT 6-24 HRS) Nasopharyngeal Nasopharyngeal Swab     Status: None   Collection Time: 10/30/2020  9:46 AM   Specimen: Nasopharyngeal Swab  Result Value Ref Range Status   SARS Coronavirus 2 NEGATIVE NEGATIVE Final    Comment: (NOTE) SARS-CoV-2 target nucleic acids are NOT DETECTED.  The SARS-CoV-2 RNA is generally detectable in upper and lower respiratory specimens during the acute phase of infection. Negative results do not preclude SARS-CoV-2 infection, do not rule out co-infections with other pathogens, and should not be used as the sole basis for treatment or other patient management decisions. Negative results must be combined with clinical observations, patient history, and epidemiological information. The expected result is Negative.  Fact Sheet for Patients: SugarRoll.be  Fact Sheet for Healthcare Providers: https://www.woods-mathews.com/  This test is not yet approved or cleared by the [place] and  has been authorized for detection and/or diagnosis of SARS-CoV-2 by FDA under an Emergency Use Authorization (EUA). This EUA will remain  in effect (meaning this test can be used) for the duration of the COVID-19 declaration under Se ction 564(b)(1) of the Act, 21 U.S.C. section 360bbb-3(b)(1), unless the authorization is terminated or revoked sooner.  Performed at [facility], [place] [address], [place], [place] [address]   MRSA Next Gen by PCR, Nasal     Status: None   Collection Time: 11/12/2020  3:09 PM   Specimen: Nasal Mucosa; Nasal Swab  Result Value Ref Range Status   MRSA by PCR Next Gen NOT DETECTED NOT DETECTED Final    Comment: (NOTE) The GeneXpert  MRSA Assay (FDA approved for NASAL specimens only), is one component of a comprehensive MRSA colonization surveillance program. It is not intended to diagnose MRSA infection nor to guide or monitor treatment for MRSA infections. Test performance is not FDA approved in patients less than 72 years old. Performed at [facility], [address] [place], [place] [address]     Coagulation Studies: Recent Labs    11/12/2020 [id number] 11/15/20 0932  LABPROT 15.5* 16.6*  INR 1.2 1.3*     Urinalysis: Recent Labs    11/15/20 0952  COLORURINE AMBER*  LABSPEC 1.031*  PHURINE 5.0  GLUCOSEU NEGATIVE  HGBUR LARGE*  BILIRUBINUR NEGATIVE  KETONESUR 5*  PROTEINUR 100*  NITRITE NEGATIVE  LEUKOCYTESUR MODERATE*       Imaging: CT HEAD WO CONTRAST (5MM)  Result Date: 11/15/2020 CLINICAL DATA:  Mental status change EXAM: CT HEAD WITHOUT CONTRAST TECHNIQUE: Contiguous axial images were obtained from the base of the skull through the vertex without intravenous contrast. COMPARISON:  CT head 01/11/2020 FINDINGS: Brain: Generalized atrophy. Mild white matter hypodensity bilaterally. Negative for acute infarct, hemorrhage, or mass. Vascular: Negative for hyperdense vessel Skull: No acute skeletal abnormality Sinuses/Orbits: Mucosal edema and bony thickening around the right maxillary sinus. Prior sinus surgery. Prior cataract extraction. Other: None IMPRESSION: No acute abnormality. Electronically Signed   By: Franchot Gallo M.D.   On: 11/15/2020 15:23   CT ANGIO AO+BIFEM W & OR WO CONTRAST  Result Date: 11/15/2020 CLINICAL DATA:  Claudication or leg ischemia, mental status change EXAM: CT ANGIOGRAPHY OF ABDOMINAL AORTA WITH ILIOFEMORAL RUNOFF TECHNIQUE: Multidetector CT imaging of the abdomen, pelvis and lower extremities was performed using the standard protocol during bolus administration of intravenous contrast. Multiplanar CT image reconstructions and MIPs were obtained to evaluate the  vascular anatomy. CONTRAST:  178m OMNIPAQUE IOHEXOL 350 MG/ML SOLN COMPARISON:  11/09/2020 FINDINGS: VASCULAR Aorta: Interval deployment of bifurcated infrarenal stent graft which is patent. Lack of pre contrast and delayed scans limits sensitivity for endoleak. There is a small amount of high-density material within the excluded native aneurysm sac as well as some gas bubbles, presumably related to recent procedure. Native aneurysm sac measures 7.1 x 6 cm maximum transverse dimensions (previously 7.3 x 6.3). No evidence of leak or rupture. Celiac: Patent without evidence of aneurysm, dissection, vasculitis or significant stenosis. SMA: Patent without evidence of aneurysm, dissection, vasculitis or significant stenosis. Replaced right hepatic arterial supply, an anatomic variant. Renals: Single right, patent. Origin occlusion of the left renal artery; there is early/partial collateral reconstitution of intraparenchymal branches. IMA: Short-segment origin occlusion, with distal reconstitution by visceral collaterals. RIGHT Lower Extremity Inflow: Right limb of the stent graft extends to  proximal external iliac, appears well apposed. Coil packing of the internal iliac artery with significant streak artifact degrading evaluation; distal branches remain patent. Outflow: Common femoral atheromatous but patent. Deep femoral branches patent. SFA patent. Popliteal patent proximally, occludes just above the knee. Runoff: High origin of the anterior tibial artery above the knee, pacified to the proximal calf. There is poor distal vascular opacification presumably due to scan timing, limiting evaluation of runoff. LEFT Lower Extremity Inflow: Left limb of the stent graft extends to the mid common iliac, well apposed. 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 External iliac atheromatous, patent. Outflow: Common femoral mildly atheromatous, patent. Deep femoral branches patent. SFA mildly atheromatous, patent. Popliteal  artery atheromatous, patent. Runoff: Limited contrast enhancement presumably due to scan timing limits evaluation runoff. Veins: No obvious venous abnormality within the limitations of this arterial phase study. Review of the MIP images confirms the above findings. NON-VASCULAR Lower chest: Left lower lobe atelectasis/consolidation. Small pleural effusions. Hepatobiliary: At least 1 partially calcified subcentimeter stone in the dependent aspect of the gallbladder. No discrete liver lesion or biliary ductal dilatation. Pancreas: Unremarkable. No pancreatic ductal dilatation or surrounding inflammatory changes. Spleen: Normal in size, with stable cyst. Adrenals/Urinary Tract: Adrenal glands unremarkable. Somewhat striated cortical enhancement of the right kidney which demonstrates mild diffuse parenchymal loss. More atrophic left kidney with some collateral reconstitution of arterial branches near the hilum, minimal parenchymal enhancement evident. No hydronephrosis or urolithiasis is evident. The urinary bladder is decompressed by Foley catheter. Stomach/Bowel: Stomach is nondistended. Small bowel decompressed. Scattered colonic diverticula without focal wall thickening or regional inflammatory change evident. Lymphatic: No abdominal or pelvic adenopathy. Reproductive: Status post hysterectomy. No adnexal masses. Other: No ascites.  No free air. Musculoskeletal: Stable compression deformities of T11, L2, and L3 vertebral bodies. No definite acute fracture or worrisome bone lesion. Osteitis pubis. IMPRESSION: 1. Patent infrarenal aortic stent graft with slight decrease in diameter of 7.1 cm native aneurysm sac. 2. Interval coil embolization of proximal RIGHT internal iliac artery. There is opacification of distal branches. 3. 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 4. RIGHT popliteal artery occlusion above the knee. Early scan timing relative to contrast bolus limits evaluation of tibial  runoff. 5. Extensive left lower lobe consolidation/atelectasis with small pleural effusions. 6. Cholelithiasis 7. Colonic diverticulosis Electronically Signed   By: Lucrezia Europe M.D.   On: 11/15/2020 15:54     Medications:    sodium chloride Stopped (11/15/20 1129)   acetaminophen 1,000 mg (12/03/2020 0606)    ceFAZolin (ANCEF) IV     cefTRIAXone (ROCEPHIN)  IV Stopped (11/24/2020 0013)   magnesium sulfate bolus IVPB     nitroGLYCERIN      Chlorhexidine Gluconate Cloth  6 each Topical Q0600   docusate sodium  100 mg Oral Daily   epoetin (EPOGEN/PROCRIT) injection  4,000 Units Intravenous Q T,Th,Sa-HD   famotidine  20 mg Intravenous Daily   midazolam  1 mg Intravenous Once   rOPINIRole  0.5 mg Oral q AM   rOPINIRole  1 mg Oral QHS   acetaminophen **OR** acetaminophen, alum & mag hydroxide-simeth, guaiFENesin-dextromethorphan, hydrALAZINE, labetalol, magnesium sulfate bolus IVPB, metoprolol tartrate, ondansetron, phenol, potassium chloride  Assessment/ Plan:  86 y.o. female with ESRD, h/o lupus nephritis, A flutter, GERD, was admitted on 10/31/2020 for  Active Problems:   Ruptured abdominal aortic aneurysm (AAA) (HCC)   AAA (abdominal aortic aneurysm) (HCC)   Aneurysm, abdominal aortic, with rupture (HCC)  Abdominal aortic aneurysm (  AAA) without rupture (HCC) [I71.4] Ruptured abdominal aortic aneurysm (AAA) (HCC) [I71.3] AAA (abdominal aortic aneurysm) (HCC) [I71.4] Aneurysm, abdominal aortic, with rupture ([place]) [I71.3]  #. ESRD, M-F Plan for next hemodialysis on Friday  Mild hyperkalemia - recommend to treat with D50/iv insulin Not able to take PO [place] due to risk of aspiration  #. Anemia of CKD  Lab Results  Component Value Date   HGB 10.8 (L) 11/22/2020   Low dose EPO with HD for Hgb < 10  #. Secondary hyperparathyroidism of renal origin N 25.81   No results found for: PTH Lab Results  Component Value Date   PHOS 8.4 (H) 11/23/2020   Monitor calcium and phos level  during this admission   #.  AAA, contained rupture S/p emergent endovascular repair on 12/14/20 Plan for angiogram today for further evaluation of leg pain.  # Acidosis Elevated lactic acid level and elevated CK Concern about ischemic injury  Overall prognosis appears to be poor    LOS: [place] 9/1/20229:23 [place], [place]

## 2020-11-16 NOTE — Progress Notes (Signed)
Discussed with Dr. Mortimer Fries, upon physical exam, pt moves upper extremities to command and wiggles toes to command, very minimal movement otherwise to bilateral lower extremities.  Unsure if this hesitancy due to severe pain in lower extremities, vs could there possibly be a spinal infarct from recent AAA rupture.  Will add on MRI Thoracic and Lumbar spine in addition to MRI Brain.     Darel Hong, AGACNP-BC [place] [facility] Prefer epic messenger for cross cover needs If after hours, please call E-link

## 2020-11-16 NOTE — Progress Notes (Signed)
GOALS OF CARE DISCUSSION  The Clinical status was relayed to family in detail. Daughter at bedside Updated and notified of patients medical condition.   Patient remains unresponsive and will not open eyes to command.   Very confused  Explained to family course of therapy and the modalities  S/p AAA rupture Severe acidosis Severe pain CTA LE pending  MRI brain and spine pending   PATIENT IS AT [place], INTUBATION AND DEATH  Patient with Progressive multiorgan failure with a very high probablity of a very minimal chance of meaningful recovery despite all aggressive and optimal medical therapy.  PATIENT REMAINS FULL CODE  Family does NOT quite understand the  grave situation.   Family are satisfied with Plan of action  All questions answered  Additional CC time 75 mins   Sreekar Broyhill Patricia Pesa, M.D.  [facility]  Medical Director [place] Director [facility] Cardio-Pulmonary Department

## 2020-11-16 NOTE — Progress Notes (Addendum)
BRIEF CRITICAL CARE NOTE Pt was hypotensive earlier this morning, which was responsive to IV fluids.  Initial SBP 70's-80's, which improved to upper 90's to low 127'N systolic following 170 cc NS bolus.    Again BP soft which responded to additional 250 cc bolus, BP improved 106/59 (72). Lactic acid also improved with IV boluses, from 4.2 to 2.8.  This afternoon, Pt taken for Angiogram, and Angiogram unable to be performed due to hypotension (SBP 70's) in pre-op.  Pt returns to ICU, BP improved to 101/58 (70).  Earlier this am pt did not meet SIRS criteria, however now SIRS Criteria is met (RR 25, Pulse 103) with confirmed UTI, therefore will perform Sepsis workup. Will hold off on further IV fluid resuscitation as pt beginning to develop crackles to bases upon auscultation. Orders placed for Levophed if needed.  Discussed with Vascular Surgery, given relative hypotension and possible need for vasopressors, along with pt being Hemodialysis, will have Trialysis catheter placed for Central venous assess along with HD access in event unable to tolerate HD and need CRRT.  Discussed with Dr. Mortimer Fries, he is in agreement with plan.   Pt's husband and daughter now at bedside.  Updated, and they are in agreement with plan.  Also discussed with family that while pt is sedated for catheter insertion and MRI, would be ideal time to place NG tube for enteral nutrition and meds, of which they are in agreement with placement.      Additional Critical Care Time: 30 minutes  Darel Hong, AGACNP-BC [place] [facility] Prefer epic messenger for cross cover needs If after hours, please call E-link

## 2020-11-16 NOTE — Progress Notes (Signed)
NAME:  Marissa Hess, MRN:  [id number], DOB:  01-17-1935, LOS: 3 ADMISSION DATE:  10/24/2020, CONSULTATION DATE: 11/03/2020 REFERRING MD: Dr. Delana Meyer CHIEF COMPLAINT: Ruptured AAA  History of Present Illness:  This is an 86 yo female who presented to [facility] ER on 08/29 with c/o lower back/abdominal pain and watery diarrhea onset of symptoms 2 weeks prior to presentation.  She does have a hx of ESRD on hemodialysis M/F and missed her HD session the morning of 08/29.    ED course: Upon arrival to the ER lab results revealed BUN 28, creatinine 3.96, albumin 3.1, and hgb 11.5.  CT Abd Pelvis revealed a large intrarenal AAA with periaortic edema concerning for small leak.  Pt also hypertensive with sbp 160-180's.  Pt placed on nicardipine gtt for bp control and vascular surgery consulted. Per vascular surgery recommendations Eliquis (hx atrial flutter) reversed with Andexxa and pt transported to the OR for emergent selective angiography, coil embolization, and stent graft placement.  Pt admitted to ICU postop and PCCM team consulted to assist with management.    Pertinent  Medical History  Allergies  Arthritis  ESRD on HD: M/F GERD  HTN Lupus Lupus nephritis  Left Eye Macular Degeneration  MI (2007) Osteoporosis  Atrial Flutter Oropharyngeal Dysphagia    Significant Hospital Events: Including procedures, antibiotic start and stop dates in addition to other pertinent events   08/29: Pt admitted to ICU s/p emergent selective angiography, coil embolization and stent graft placement of AAA, juxtarenal    with contained rupture  08/30: Hemodynamically stable, BP controlled (no IV antihypertensives being required), with ACUTE METABOLIC ENCEPHALOPATHY (? Pain meds vs ICU delirium) 8/31: Obtaining CT Head and UA for AMS, CTA abdomen & pelvis with runoff due to LLE pain;  with worsening Thrombocytopenia, obtaining workup 9/1: UA consistent with UTI, placed on Ceftriaxone, plan for MRI and considering  Neurology consult, Vascular Surgery planning for Angiogram of LLE  Interim History / Subjective:  -Daughter at bedside this morning -Urinalysis from yesterday consistent with UTI, placed on Ceftriaxone, urine culture pending -Remains altered (virtually unchanged from yesterday) ~ will obtain MRI Brain  -BP soft ~ will give 250 cc fluid bolus  -Vascular Surgery planning for LLE angiogram today -Afebrile, weaned to nasal cannula and maintaining O2 sats -Platelets slightly improved to 101 (82 yesterday) -WBC remains normal 5.8 (5.9 yesterday )  Objective   Blood pressure 90/70, pulse (!) 43, temperature 98 F (36.7 C), temperature source Axillary, resp. rate (!) 21, height '5\' 3"'$  (1.6 m), weight 58.2 kg, SpO2 (!) 76 %.        Intake/Output Summary (Last 24 hours) at 12/10/2020 0740 Last data filed at 11/30/2020 0600 Gross per 24 hour  Intake 1276.2 ml  Output --  Net 1276.2 ml    Filed Weights   10/21/2020 1500  Weight: 58.2 kg    Examination: General: Acute on Chronically  ill appearing Elderly female resting in bed, sleeping upon arrival to room, intermittently restless HENT: Atraumatic, normocephalic, neck supple, no JVD Lungs: clear throughout, even, non labored  Cardiovascular: nsr, rrr, no R/G, 2+ radial/ doppler pedal pulses, no edema  Abdomen: +BS x4, obese, soft, non tender, non distended  Extremities: moves all extremities, bilateral feet cool to touch  Skin: bilateral groin dressings in place with no bleeding or hematoma present; RUE fistula +bruit and thrill  Neuro: Sleeping, arouses to voice, oriented only to self, moves all extremities purposefully, no focal deficits, pupils PERRL GU: indwelling foley in  place draining small amount of yellow urine   Resolved Hospital Problem list     Assessment & Plan:   AAA, juxtarenal with contained rupture s/p emergent angiography, coil embolization, and stent graft placement  LLE Rest pain Hypotension, ? Hypovolemia due to  poor PO intake with AMS Hx: Atrial flutter/fibrillation, Hypertension -Vascular Surgery following, appreciate input ~ will follow recommendations -Continuous cardiac monitoring -Maintain MAP >65 -Cautious IV fluids -Will give 250 cc NS bolus and assess for fluid responsiveness -Vasopressors if needed to maintain MAP goal -Trend lactic acid until normalized -Monitor for s/sx of bleeding -Holding outpatient Eliquis -Hold outpatient carvedilol due to soft BP -Completed Clindamycin for surgical prophylaxis  -Plan for LLE Angiogram today 11/29/2020 with Vascular Surgery  ESRD on Hemodialysis  Hyperkalemia Hyponatremia, suspect due to D5W infusion -Monitor I&O's / urinary output -Follow BMP -Ensure adequate renal perfusion -Avoid nephrotoxic agents as able -Replace electrolytes as indicated -Nephrology following, appreciate input -HD as per Nephrology -Stop D5 infusion  Anemia of Chronic Disease Thrombocytopenia>>slolwly improving -Monitor for S/Sx of bleeding -Trend CBC -SCD's for VTE Prophylaxis (no chemical prophylaxis until approved by Vascular Surgery, hold outpatient Eliquis) -Transfuse for Hgb <7 -Transfuse platelets for platelet count of <50 with active bleeding -Thrombocytopenia workup not concerning for DIC or TTP, peripheral smear normal (suspect thrombocytopenia is due to recent AAA with thrombus and recent repair) -HIT panel/serotonin assay pending  Acute Metabolic Encephalopathy, ? Secondary to UTI vs. pain vs. adverse reaction to morphine vs. ICU Delirium vs. Concern for possible Anoxic Brain Injury -Provide supportive care -Promote normal sleep/wake cycle -OOB as able, PT/OT when cleared by Vascular Surgery -Avoid sedating medications and Narcotics as able -Continue home Requip -CT Head negative 8/31 -VBG without hypercapnia -Urinalysis consistent with UTI ~ Ceftriaxone started -Plan for MRI Brain today 9/1  Urinary Tract Infection Currently does not meet SIRS  Criteria (Temperature 98, HR 71, RR 22, WBC 5.8) -Monitor fever curve -Trend WBC's & Procalcitonin -Follow cultures as above -Continue empiric Ceftriaxone pending cultures & sensitivities  Hypoglycemia>>improved -CBG's q4h; Target range of 140 to 180 -Follow ICU Hypo/Hyperglycemia protocol -Encourage PO intake as able -Discontinue D5W 11/28/2020 due to Hyponatremia       Pt is critically ill, prognosis is guarded and overall long term prognosis is poor.   Recommend DNR/DNI status.     Best Practice (right click and "Reselect all SmartList Selections" daily)   Diet/type: clear liquids DVT prophylaxis: SCD GI prophylaxis: Pepcid Lines: N/A Foley:  Yes Code Status:  full code Last date of multidisciplinary goals of care discussion [11/15/2020]  Pt's daughter updated at bedside 11/19/2020.  All questions answered.  Labs   CBC: Recent Labs  Lab 10/17/2020 0615 11/04/2020 1537 11/14/20 0611 11/15/20 0456 12/10/2020 0519  WBC 5.4 7.5 6.7 5.9 5.8  NEUTROABS  --  6.0  --   --   --   HGB 11.5* 11.3* 10.3* 9.4* 10.8*  HCT 35.0* 35.2* 31.1* 28.9* 32.4*  MCV 90.2 89.3 91.7 88.4 89.3  PLT 197 152 110* 82* 101*     Basic Metabolic Panel: Recent Labs  Lab 10/28/2020 0615 10/19/2020 1537 11/14/20 0611 11/15/20 0456 12/09/2020 0519  NA 135  --  137 135 128*  K 4.3  --  5.2* 4.2 5.4*  CL 102  --  104 98 92*  CO2 22  --  21* 24 21*  GLUCOSE 78  --  67* 57* 91  BUN 28*  --  32* 22 32*  CREATININE 3.96*  --  4.39* 3.01* 4.04*  CALCIUM 8.9  --  8.3* 7.7* 8.0*  MG  --  1.8  --   --   --   PHOS  --  6.3*  --  6.5* 8.4*    GFR: Estimated Creatinine Clearance: 8.6 mL/min (A) (by C-G formula based on SCr of 4.04 mg/dL (H)). Recent Labs  Lab 10/25/2020 1537 11/14/20 0611 11/14/20 1730 11/14/20 2122 11/15/20 0456 11/24/2020 0519  WBC 7.5 6.7  --   --  5.9 5.8  LATICACIDVEN  --   --  1.5 1.0  --   --      Liver Function Tests: Recent Labs  Lab 11/12/2020 0615 11/15/20 0456  11/30/2020 0519  AST 17  --   --   ALT 10  --   --   ALKPHOS 47  --   --   BILITOT 1.3*  --   --   PROT 7.1  --   --   ALBUMIN 3.1* 2.4* 2.2*    Recent Labs  Lab 11/05/2020 0615  LIPASE 27    No results for input(s): AMMONIA in the last 168 hours.  ABG No results found for: PHART, PCO2ART, PO2ART, HCO3, TCO2, ACIDBASEDEF, O2SAT   Coagulation Profile: Recent Labs  Lab 11/15/2020 0946 11/15/20 0932  INR 1.2 1.3*     Cardiac Enzymes: Recent Labs  Lab 11/14/20 1730  CKTOTAL 5,364*     HbA1C: No results found for: HGBA1C  CBG: Recent Labs  Lab 11/15/20 0614 11/15/20 2006 12/02/2020 0033 12/12/2020 0327 11/18/2020 0727  GLUCAP 71 70 82 86 117*     Review of Systems: Positives in BOLD   Unable to assess due to AMS  Past Medical History:  She,  has a past medical history of Allergy, Arthritis, Chronic kidney disease, GERD (gastroesophageal reflux disease), Hypertension, Lupus ([place]) (2002), Macular degeneration of left eye, Myocardial infarction ([place]) (2007), and Osteoporosis.   Surgical History:   Past Surgical History:  Procedure Laterality Date   A/V FISTULAGRAM Right 01/16/2018   Procedure: A/V FISTULAGRAM;  Surgeon: [name], MD;  Location: [facility];  Service: Cardiovascular;  Laterality: Right;   ABDOMINAL HYSTERECTOMY     AV FISTULA PLACEMENT Right 12/17/2017   Procedure: ARTERIOVENOUS (AV) FISTULA CREATION ( BRACHIO CEPHALIC POSS. BRACHIO BASILIC );  Surgeon: Algernon Huxley, MD;  Location: [facility];  Service: Vascular;  Laterality: Right;   CARDIOVERSION N/A 10/04/2020   Procedure: CARDIOVERSION;  Surgeon: Kate Sable, MD;  Location: [facility];  Service: Cardiovascular;  Laterality: N/A;   CATARACT EXTRACTION W/PHACO Right 01/03/2016   Procedure: CATARACT EXTRACTION PHACO AND INTRAOCULAR LENS PLACEMENT ([place]);  Surgeon: Leandrew Koyanagi, MD;  Location: [place];  Service: Ophthalmology;  Laterality: Right;   COLON SURGERY      intestine became twisted after hernia sugery   ENDOVASCULAR REPAIR/STENT GRAFT N/A 11/05/2020   Procedure: ENDOVASCULAR REPAIR/STENT GRAFT;  Surgeon: Katha Cabal, MD;  Location: [place] [facility];  Service: Cardiovascular;  Laterality: N/A;   EYE SURGERY     cataract   HERNIA REPAIR     TEMPORAL ARTERY BIOPSY / LIGATION     TONSILLECTOMY       Social History:   reports that she quit smoking about 26 years ago. Her smoking use included cigarettes. She started smoking about 36 years ago. She has never used smokeless tobacco. She reports that she does not drink alcohol and does not use drugs.   Family History:  Her family  history includes Rheum arthritis in her father; Stomach cancer in her mother; Thyroid disease in her daughter; Varicose Veins in her maternal aunt. There is no history of Breast cancer.   Allergies Allergies  Allergen Reactions   Codeine Itching   Ibuprofen     Avoids "because of lupus"   Morphine And Related Other (See Comments)    Confusion/Agitation   Sulfa Antibiotics Diarrhea   Vioxx [Rofecoxib]     Avoids "because of lupus"   Celebrex [Celecoxib] Rash    Avoids "because of lupus"   Penicillins Rash    Reaction: over 30 years ago     Home Medications  Prior to Admission medications   Medication Sig Start Date End Date Taking? Authorizing Provider  apixaban (ELIQUIS) 2.5 MG TABS tablet Take 1 tablet (2.5 mg total) by mouth 2 (two) times daily. 09/06/20   Cannady, Henrine Screws T, NP  carvedilol (COREG) 25 MG tablet Take 1 tablet (25 mg total) by mouth 2 (two) times daily with a meal. 11/12/19   Cannady, Jolene T, NP  cholecalciferol (VITAMIN D) 1000 units tablet Take 1,000 Units by mouth daily.     [provider]  ferrous sulfate (SLOW FE) 160 (50 Fe) MG TBCR SR tablet Take 160 mg by mouth daily.    [provider]  folic acid (FOLVITE) 1 MG tablet Take 1 tablet (1 mg total) by mouth daily. 12/09/19   Johnson, Megan P, DO  lidocaine  (LIDODERM) 5 % Place 1 patch onto the skin daily. Remove & Discard patch within 12 hours or as directed by MD Patient not taking: Reported on 10/16/2020 11/08/20   Marnee Guarneri T, NP  mirtazapine (REMERON) 7.5 MG tablet Take 7.5 mg by mouth at bedtime. 08/31/20   [provider]  rOPINIRole (REQUIP) 0.5 MG tablet Take one tablet (0.5 MG) by mouth in the morning and then take two tablets (1 MG) by mouth at night before bedtime. Patient taking differently: Take 0.5-1 mg by mouth See admin instructions. Take one tablet (0.5 MG) by mouth in the morning and then take two tablets (1 MG) by mouth at night before bedtime. 01/11/20   Cannady, Henrine Screws T, NP  tiZANidine (ZANAFLEX) 2 MG tablet Take 1 tablet (2 mg total) by mouth daily as needed for muscle spasms. 10/19/2020   Venita Lick, NP     Critical care time: 38 minutes      Darel Hong, AGACNP-BC [facility] Prefer epic messenger for cross cover needs If after hours, please call E-link

## 2020-11-16 NOTE — Progress Notes (Signed)
In secure chat with Darlyn Chamber NP, pt has met sepsis criteria but NP wishes to not give 30cc/kg at this time.  Per NP, pt received 750cc earlier and is HD pt and is starting to auscultate crackles at lung bases. LA, BC and ABx have been drawn/given. Appreciate help from NP.

## 2020-11-16 DEATH — deceased

## 2020-11-17 ENCOUNTER — Inpatient Hospital Stay: Payer: Medicare Other

## 2020-11-17 ENCOUNTER — Encounter: Payer: Self-pay | Admitting: Vascular Surgery

## 2020-11-17 DIAGNOSIS — J9601 Acute respiratory failure with hypoxia: Secondary | ICD-10-CM | POA: Diagnosis not present

## 2020-11-17 DIAGNOSIS — G9340 Encephalopathy, unspecified: Secondary | ICD-10-CM | POA: Diagnosis not present

## 2020-11-17 DIAGNOSIS — I713 Abdominal aortic aneurysm, ruptured: Secondary | ICD-10-CM | POA: Diagnosis not present

## 2020-11-17 DIAGNOSIS — I639 Cerebral infarction, unspecified: Secondary | ICD-10-CM | POA: Diagnosis not present

## 2020-11-17 LAB — BASIC METABOLIC PANEL
Anion gap: 16 — ABNORMAL HIGH (ref 5–15)
Anion gap: 11 (ref 5–15)
BUN: 40 mg/dL — ABNORMAL HIGH (ref 8–23)
BUN: 27 mg/dL — ABNORMAL HIGH (ref 8–23)
CO2: 18 mmol/L — ABNORMAL LOW (ref 22–32)
CO2: 23 mmol/L (ref 22–32)
Calcium: 7.7 mg/dL — ABNORMAL LOW (ref 8.9–10.3)
Calcium: 7.6 mg/dL — ABNORMAL LOW (ref 8.9–10.3)
Chloride: 94 mmol/L — ABNORMAL LOW (ref 98–111)
Chloride: 97 mmol/L — ABNORMAL LOW (ref 98–111)
Creatinine, Ser: 4.77 mg/dL — ABNORMAL HIGH (ref 0.44–1.00)
Creatinine, Ser: 3.56 mg/dL — ABNORMAL HIGH (ref 0.44–1.00)
GFR, Estimated: 9 mL/min — ABNORMAL LOW (ref >60)
GFR, Estimated: 12 mL/min — ABNORMAL LOW (ref >60)
Glucose, Bld: 82 mg/dL (ref 70–99)
Glucose, Bld: 94 mg/dL (ref 70–99)
Potassium: 6 mmol/L — ABNORMAL HIGH (ref 3.5–5.1)
Potassium: 4.9 mmol/L (ref 3.5–5.1)
Sodium: 128 mmol/L — ABNORMAL LOW (ref 135–145)
Sodium: 131 mmol/L — ABNORMAL LOW (ref 135–145)

## 2020-11-17 LAB — CBC
HCT: 29.2 % — ABNORMAL LOW (ref 36.0–46.0)
Hemoglobin: 10.2 g/dL — ABNORMAL LOW (ref 12.0–15.0)
MCH: 29.9 pg (ref 26.0–34.0)
MCHC: 34.9 g/dL (ref 30.0–36.0)
MCV: 85.6 fL (ref 80.0–100.0)
Platelets: 115 10*3/uL — ABNORMAL LOW (ref 150–400)
RBC: 3.41 MIL/uL — ABNORMAL LOW (ref 3.87–5.11)
RDW: 19.9 % — ABNORMAL HIGH (ref 11.5–15.5)
WBC: 4.2 10*3/uL (ref 4.0–10.5)
nRBC: 0.5 % — ABNORMAL HIGH (ref 0.0–0.2)

## 2020-11-17 LAB — RENAL FUNCTION PANEL
Albumin: 2.1 g/dL — ABNORMAL LOW (ref 3.5–5.0)
Albumin: 2.1 g/dL — ABNORMAL LOW (ref 3.5–5.0)
Anion gap: 15 (ref 5–15)
Anion gap: 14 (ref 5–15)
BUN: 41 mg/dL — ABNORMAL HIGH (ref 8–23)
BUN: 42 mg/dL — ABNORMAL HIGH (ref 8–23)
CO2: 19 mmol/L — ABNORMAL LOW (ref 22–32)
CO2: 21 mmol/L — ABNORMAL LOW (ref 22–32)
Calcium: 7.7 mg/dL — ABNORMAL LOW (ref 8.9–10.3)
Calcium: 7.7 mg/dL — ABNORMAL LOW (ref 8.9–10.3)
Chloride: 94 mmol/L — ABNORMAL LOW (ref 98–111)
Chloride: 94 mmol/L — ABNORMAL LOW (ref 98–111)
Creatinine, Ser: 4.76 mg/dL — ABNORMAL HIGH (ref 0.44–1.00)
Creatinine, Ser: 4.85 mg/dL — ABNORMAL HIGH (ref 0.44–1.00)
GFR, Estimated: 9 mL/min — ABNORMAL LOW (ref >60)
GFR, Estimated: 8 mL/min — ABNORMAL LOW (ref >60)
Glucose, Bld: 81 mg/dL (ref 70–99)
Glucose, Bld: 107 mg/dL — ABNORMAL HIGH (ref 70–99)
Phosphorus: 9.5 mg/dL — ABNORMAL HIGH (ref 2.5–4.6)
Phosphorus: 9.9 mg/dL — ABNORMAL HIGH (ref 2.5–4.6)
Potassium: 6 mmol/L — ABNORMAL HIGH (ref 3.5–5.1)
Potassium: 5.7 mmol/L — ABNORMAL HIGH (ref 3.5–5.1)
Sodium: 128 mmol/L — ABNORMAL LOW (ref 135–145)
Sodium: 129 mmol/L — ABNORMAL LOW (ref 135–145)

## 2020-11-17 LAB — GLUCOSE, CAPILLARY
Glucose-Capillary: 104 mg/dL — ABNORMAL HIGH (ref 70–99)
Glucose-Capillary: 107 mg/dL — ABNORMAL HIGH (ref 70–99)
Glucose-Capillary: 59 mg/dL — ABNORMAL LOW (ref 70–99)
Glucose-Capillary: 67 mg/dL — ABNORMAL LOW (ref 70–99)
Glucose-Capillary: 74 mg/dL (ref 70–99)
Glucose-Capillary: 75 mg/dL (ref 70–99)
Glucose-Capillary: 81 mg/dL (ref 70–99)
Glucose-Capillary: 83 mg/dL (ref 70–99)
Glucose-Capillary: 95 mg/dL (ref 70–99)

## 2020-11-17 LAB — TSH: TSH: 4.16 u[IU]/mL (ref 0.350–4.500)

## 2020-11-17 LAB — IRON AND TIBC: Iron: 36 ug/dL (ref 28–170)

## 2020-11-17 LAB — CK
Total CK: 13125 U/L — ABNORMAL HIGH (ref 38–234)
Total CK: 10035 U/L — ABNORMAL HIGH (ref 38–234)

## 2020-11-17 LAB — URINE CULTURE

## 2020-11-17 LAB — BLOOD GAS, VENOUS
Acid-base deficit: 0.7 mmol/L (ref 0.0–2.0)
Bicarbonate: 24.9 mmol/L (ref 20.0–28.0)
FIO2: 0.28
O2 Saturation: 66.2 %
Patient temperature: 37
pCO2, Ven: 44 mmHg (ref 44.0–60.0)
pH, Ven: 7.36 (ref 7.250–7.430)
pO2, Ven: 36 mmHg (ref 32.0–45.0)

## 2020-11-17 LAB — MAGNESIUM
Magnesium: 2.4 mg/dL (ref 1.7–2.4)
Magnesium: 2.1 mg/dL (ref 1.7–2.4)

## 2020-11-17 LAB — PROCALCITONIN: Procalcitonin: 16.79 ng/mL

## 2020-11-17 LAB — SEROTONIN RELEASE ASSAY (SRA)
SRA .2 IU/mL UFH Ser-aCnc: 1 % (ref 0–20)
SRA 100IU/mL UFH Ser-aCnc: <1 % (ref 0–20)

## 2020-11-17 LAB — AMMONIA: Ammonia: 13 umol/L (ref 9–35)

## 2020-11-17 LAB — CORTISOL: Cortisol, Plasma: 63.4 ug/dL

## 2020-11-17 LAB — LACTIC ACID, PLASMA: Lactic Acid, Venous: 2.3 mmol/L (ref 0.5–1.9)

## 2020-11-17 IMAGING — CT CT ANGIO HEAD-NECK (W OR W/O PERF)
1 of 11 series · 5 of 33 positions shown · IV contrast (APPLIED)
Comparison: CT head 11/15/2020.  MRI head 11/16/2020.

CLINICAL DATA: Stroke, follow up Stroke/TIA, assess intracranial
arteries

EXAM:
CT ANGIOGRAPHY HEAD AND NECK
TECHNIQUE: Multidetector CT imaging of the head and neck was performed using
the standard protocol during bolus administration of intravenous
contrast. Multiplanar CT image reconstructions and MIPs were
obtained to evaluate the vascular anatomy. Carotid stenosis
measurements (when applicable) are obtained utilizing NASCET
criteria, using the distal internal carotid diameter as the
denominator.
CONTRAST:  75mL OMNIPAQUE IOHEXOL 350 MG/ML SOLN

[Series 10: ax thin · axial · 0.41mm/px · z∈[+430,+663]mm · 5 of 349 slices shown]
[im 59/349  soft-tissue]
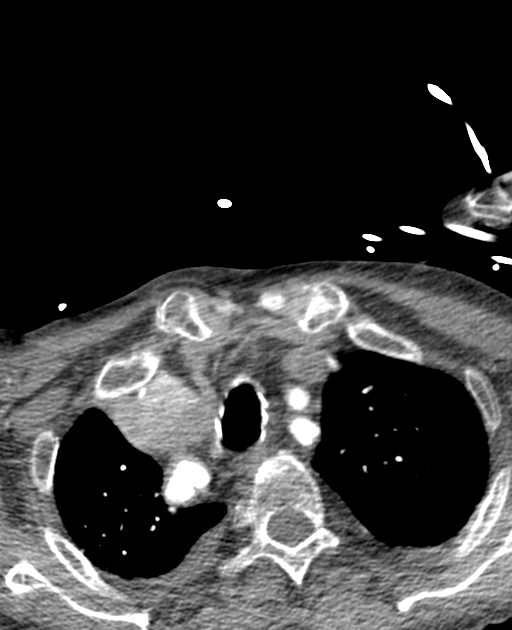
[im 117/349  bone]
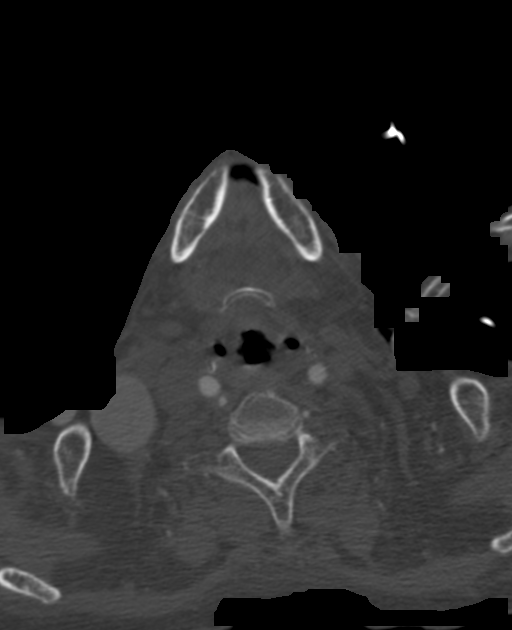
[im 175/349  soft-tissue]
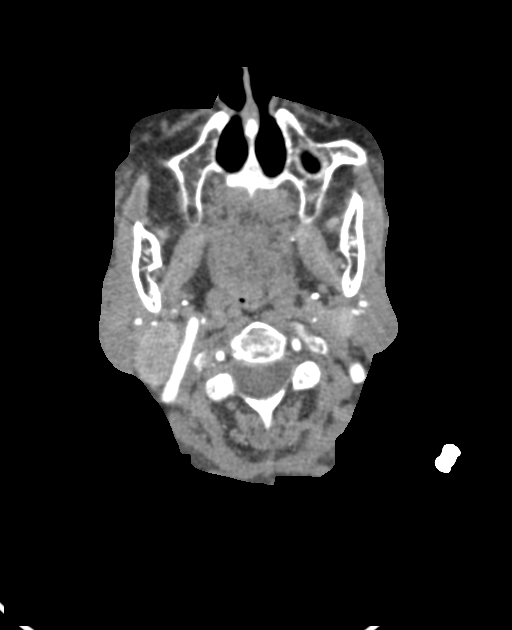
[im 233/349  bone]
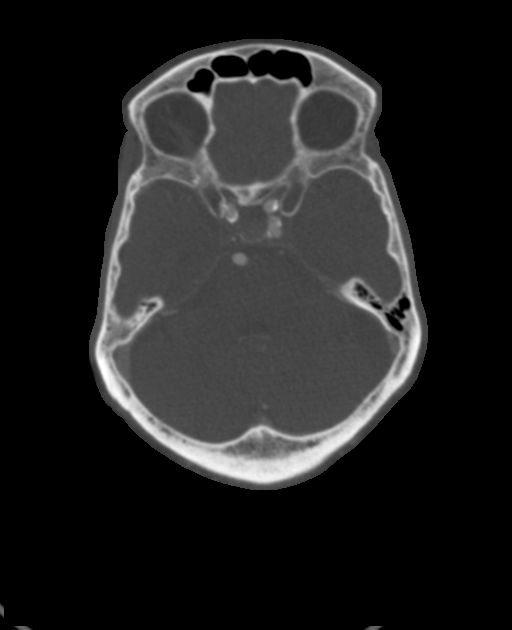
[im 291/349  soft-tissue]
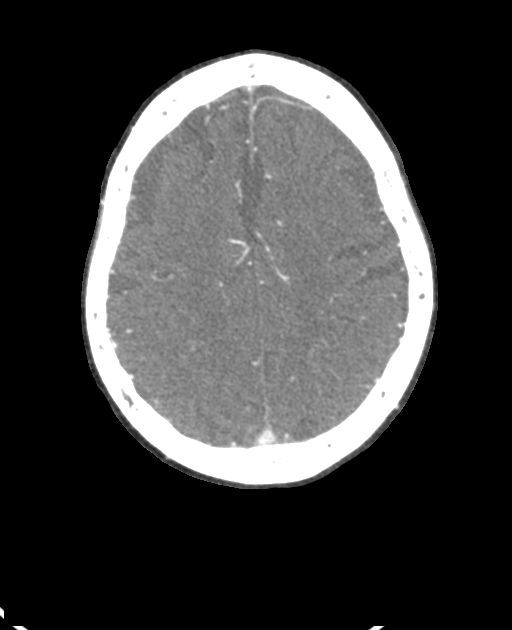

[5 of 33 positions shown; findings below may reference images not displayed]

FINDINGS: CT HEAD FINDINGS

Brain: Known small right cerebellar and temporal lobe infarcts not
well characterized by CT. No evidence of interval acute large
vascular territory infarct. No acute hemorrhage. Intracranial and
vascular enhancement is noted, presumably from recent contrasted
studies. Similar mild for age patchy white matter hypoattenuation,
nonspecific but compatible with chronic microvascular ischemic
disease. Similar mild for age atrophy with ex vacuo ventricular
dilation. No evidence of mass lesion or abnormal mass effect. No
extra-axial fluid collections identified.

Vascular: See below.

Skull: No acute fracture. Scattered lucent lesions in the calvarium,
probably prominent vessels.

Sinuses: Near complete opacification of the right sphenoid sinus
with bony wall thickening, suggestive of chronic sinusitis.

Orbits: No acute finding.

Review of the MIP images confirms the above findings

CTA NECK FINDINGS

Aortic arch: Ulcerated atherosclerosis of the aortic arch. Great
vessel origins are patent.

Right carotid system: Motion limited evaluation. Calcified
noncalcified atherosclerosis at the carotid bifurcation without
evidence of greater than 50% stenosis of the common carotid artery
or ICA.

Left carotid system: Motion limited evaluation. Calcific and
noncalcific atherosclerosis at the carotid bifurcation and involving
the proximal ICA without evidence of significant (greater than 50%)
stenosis.

Vertebral arteries: Significantly motion limited evaluation of the
proximal vertebral arteries. Apparent narrowing of the proximal
vertebral arteries could represent motion artifact or stenosis. More
distal vertebral arteries are patent without greater than 50%
stenosis. Codominant.

Skeleton: Osteopenia. Remote anterior vertebral height loss at T1
and T2, similar to CT cervical spine from January 11, 2020. height
loss of additional more inferior thoracic vertebral bodies, likely
also chronic given similar findings on prior chest radiograph from
January 13, 2018.

Other neck: Prominent/dilated right internal jugular vein. No
evidence of acute abnormality.

Upper chest: Motion limited evaluation. Small layering bilateral
pleural effusions. Partially imaged overlying right lung opacities,
further characterized on recent chest radiograph.

Review of the MIP images confirms the above findings

CTA HEAD FINDINGS

Anterior circulation: Moderate to severe left paraclinoid ICA
stenosis. Atherosclerosis of bilateral intracranial ICAs with
moderate to severe left paraclinoid ICA stenosis. Mild right
paraclinoid ICA stenosis. Severe left M1 MCA stenosis. Proximal left
M2 MCA branches are patent. Right M1 MCA is patent. Moderate
proximal right M2 MCA branch stenosis. Bilateral ACAs are patent.
Severe right and moderate left A3 ACA stenosis.

Posterior circulation: Bilateral intradural vertebral arteries,
basilar artery, and posterior cerebral arteries are patent. Fusiform
aneurysmal dilation of the proximal basilar artery, measuring up to
6.5 cm. Small right P1 PCA with right posterior communicating
artery, anatomic variant. Smaller left posterior communicating
artery with more prominent left P1 PCA. Right proximal P2 PCAs
patent. There is abrupt non opacification of a right P2 PCA branch
immediately distal to a bifurcation (see series 10, images 249/250;
series 11, images 132/133; series 12, image 81), compatible with
occlusion. More medial right PCA branch remains patent distally.
Left PCA is patent. Additional multifocal mild stenosis of the PCAs
bilaterally.

Venous sinuses: As permitted by contrast timing, patent.

Anatomic variants: As detailed above.

Review of the MIP images confirms the above findings
IMPRESSION: CTA Head:

1. Occlusion of a right P2 PCA branch immediately distal to a
bifurcation.
2. Severe left M1 MCA stenosis.
3. Moderate to severe left paraclinoid ICA stenosis.
4. Severe right and moderate left A3 ACA stenosis.
5. Moderate proximal right M2 MCA branch stenosis.
6. Fusiform aneurysmal dilation of the proximal basilar artery,
measuring up to 6.5 cm.

CTA Neck:

1. Motion limited evaluation. Apparent moderate narrowing of the
proximal vertebral arteries could represent motion artifact and/or
stenosis and is not well characterized. Otherwise, no evidence of
significant (greater than 50%) stenosis in the neck.
2. Small layering bilateral pleural effusions. Partially imaged
overlying right lung opacities, further characterized on recent
chest radiograph.
3. Height loss of multiple thoracic vertebral bodies, partially
imaged and likely chronic given similar findings on prior imaging.
If there is concern for acute/recent fracture then MRI could further
evaluate.

CT Head:

1. Known small right cerebellar and temporal lobe infarcts not well
characterized by CT.
2. Otherwise, no evidence of interval acute abnormality.
3. Mild for age chronic microvascular ischemic disease and atrophy.
4. Near complete opacification right sphenoid sinus with findings
suggestive of chronic sinusitis.

## 2020-11-17 MED ORDER — DEXTROSE 50 % IV SOLN
12.5000 g | INTRAVENOUS | Status: AC
  Administered 2020-11-17: 12.5 g via INTRAVENOUS
  Filled 2020-11-17: qty 50

## 2020-11-17 MED ORDER — SODIUM BICARBONATE 8.4 % IV SOLN
50.0000 meq | Freq: Once | INTRAVENOUS | Status: AC
  Administered 2020-11-17: 50 meq via INTRAVENOUS
  Filled 2020-11-17: qty 50

## 2020-11-17 MED ORDER — MIDAZOLAM HCL 2 MG/2ML IJ SOLN
1.0000 mg | Freq: Once | INTRAMUSCULAR | Status: AC
  Administered 2020-11-17: 1 mg via INTRAVENOUS
  Filled 2020-11-17: qty 2

## 2020-11-17 MED ORDER — IOHEXOL 350 MG/ML SOLN
75.0000 mL | Freq: Once | INTRAVENOUS | Status: AC | PRN
  Administered 2020-11-17: 75 mL via INTRAVENOUS

## 2020-11-17 MED ORDER — THIAMINE HCL 100 MG/ML IJ SOLN
100.0000 mg | Freq: Every day | INTRAMUSCULAR | Status: DC
  Administered 2020-11-17 – 2020-11-19 (×3): 100 mg via INTRAVENOUS
  Filled 2020-11-17 (×3): qty 2

## 2020-11-17 MED ORDER — CALCIUM GLUCONATE-NACL 1-0.675 GM/50ML-% IV SOLN
1.0000 g | Freq: Once | INTRAVENOUS | Status: AC
  Administered 2020-11-17: 1000 mg via INTRAVENOUS
  Filled 2020-11-17: qty 50

## 2020-11-17 MED ORDER — HEPARIN SODIUM (PORCINE) 5000 UNIT/ML IJ SOLN
INTRAMUSCULAR | Status: AC
  Filled 2020-11-17: qty 4

## 2020-11-17 NOTE — Consult Note (Signed)
Neurology Consultation Reason for Consult: Encephalopathy Referring Physician: Windy Canny  CC: Altered mental status  History is obtained from: Chart review, daughter  HPI: Marissa Hess is a 86 y.o. female who was admitted on 8/29 with a ruptured aortic aneurysm with an intramural hematoma.  She was reversed with Andexxa and taken to the OR for emergent stent grafting.  He was noted at that time that she had bilateral cold extremities.  Her daughter states that she was initially wheeling her toes to command after surgery, but subsequently has not been.  On 8/30, it was noted that she was much more encephalopathic and critical care was involved at that time.  It was felt that this could have been partially due to morphine which she had received overnight between 8/29 and 8/30.  ID/31 it is noted that she continues to complain of leg pain, so presumably she was at least still able to speak at that time.  She was noted to be oriented to self only, but moving all extremities.  She has continued to be encephalopathic, and daughter reports that she has not wiggle toes to command in a couple of days.  LKW: 8/29 tpa given?: no, recent surgery   ROS: Unable to obtain due to altered mental status.   Past Medical History:  Diagnosis Date   Allergy    Arthritis    Chronic kidney disease    CKD V   GERD (gastroesophageal reflux disease)    Hypertension    Lupus ([place]) 2002   Macular degeneration of left eye    Myocardial infarction [facility]) 2007   Osteoporosis      Family History  Problem Relation Age of Onset   Stomach cancer Mother    Rheum arthritis Father    Thyroid disease Daughter    Varicose Veins Maternal Aunt    Breast cancer Neg Hx      Social History:  reports that she quit smoking about 26 years ago. Her smoking use included cigarettes. She started smoking about 36 years ago. She has never used smokeless tobacco. She reports that she does not drink alcohol and does not use  drugs.   Exam: Current vital signs: BP (!) 124/56   Pulse 62   Temp 97.9 F (36.6 C) (Axillary)   Resp (!) 26   Ht '5\' 3"'$  (1.6 m)   Wt 58.2 kg   SpO2 96%   BMI 22.73 kg/m  Vital signs in last 24 hours: Temp:  [97.5 F (36.4 C)-99.4 F (37.4 C)] 97.9 F (36.6 C) (09/02 0400) Pulse Rate:  [57-146] 62 (09/02 0600) Resp:  [23-31] 26 (09/02 0600) BP: (78-124)/(40-67) 124/56 (09/02 0600) SpO2:  [58 %-100 %] 96 % (09/02 0600)   Physical Exam  Constitutional: Appears well-developed and well-nourished.  Psych: + Agitation Eyes: No scleral injection HENT: No OP obstruction MSK: no joint deformities.  Cardiovascular: Normal rate and regular rhythm.  Respiratory: Effort normal, non-labored breathing GI: Soft.  No distension. There is no tenderness.  Skin: WDI Extremities: Feet are cold with poorly palpable pulses bilaterally  Neuro: Mental Status: Patient has open eyes, but does not follow commands.  She does fixate the examiner.  She has more or less constant agitated type movements. Cranial Nerves: II: She does fixate, but does not blink to threat from either side. Pupils are equal, round, and reactive to light.   III,IV, VI: She crosses midline in both directions V: VII: She blinks to eyelid stimulation bilaterally Motor: She was  bilateral arms quasipurposefully with good strength.  She has minimal movement of bilateral lower extremities even with noxious stimulation Sensory: She response to noxious stimulation in all four extremities Cerebellar: He does not perform   I have reviewed labs in epic and the results pertinent to this consultation are: CK 13,000  I have reviewed the images obtained: MRI brain-several small strokes in the cerebellum  CT angiogram-right P2 occlusion, which does not correspond to ischemia on MRI, multiple stenoses.  Impression: 86 year old with ruptured aortic aneurysm.  Given the cold temperature, and significant pain associated of the  bilateral lower extremities, I suspect that this is direct limb ischemia as opposed to spinal cord infarct, though the latter is still possible.  Her MRI of her thoracic spine does not demonstrate clear T2 change, but this can be negative early on.  I doubt diffusion imaging would be helpful.  I doubt that the findings on MRI are much related to her encephalopathy, but suspect it is rather a multifactorial encephalopathy in the setting of multiple organ dysfunction and rhabdo.  Recommendations: 1) agree with ammonia, TSH, cortisol 2) CK could be useful as it could be a marker of muscle breakdown due to limb ischemia 3) EEG 4) also will check B12, B1 and start thiamine supplementation though my suspicion for wernickes is low 5) neurology will continue to follow   Roland Rack, MD [facility] [phone]  If 7pm- 7am, please page neurology on call as listed in [place].

## 2020-11-17 NOTE — Progress Notes (Signed)
[facility] Daily Progress Note  11/08/2020: Ruptured AAA repair  Subjective: Patient minimally responsive again today.  No change in mental status compared to yesterday.   Objective: Vitals:   11/17/20 1245 11/17/20 1300 11/17/20 1315 11/17/20 1330  BP: (!) 113/56 (!) 123/52 106/66 102/60  Pulse: 89 90 87 (!) 142  Resp: 14 12 (!) 26 20  Temp:      TempSrc:      SpO2: 92% 94% 92% (!) 74%  Weight:      Height:        Intake/Output Summary (Last 24 hours) at 11/17/2020 1355 Last data filed at 11/17/2020 0600 Gross per 24 hour  Intake 681.24 ml  Output --  Net 681.24 ml   Physical Exam: Altered mental status, NAD CV: RRR Pulmonary: CTA Bilaterally Abdomen: Soft, Nontender, Nondistended Vascular:             Right lower extremity: Thigh soft.  Calf soft.  Hard to palpate pedal pulses.  Extremity is warm however transitions at the ankle and becomes cooler.  There is mottling noted to the underside of the feet.             Left lower extremity: Thigh soft.  Calf soft.  Hard to palpate pedal pulses.  Extremity is warm however transitions at the ankle and becomes cooler.  There is mottling noted to the underside of the feet.   Laboratory: CBC    Component Value Date/Time   WBC 4.2 11/17/2020 0525   HGB 10.2 (L) 11/17/2020 0525   HGB 10.9 (L) 09/06/2020 1035   HCT 29.2 (L) 11/17/2020 0525   HCT 33.5 (L) 09/06/2020 1035   PLT 115 (L) 11/17/2020 0525   PLT 201 09/06/2020 1035   BMET    Component Value Date/Time   NA 129 (L) 11/17/2020 0525   NA 140 09/06/2020 1035   K 5.7 (H) 11/17/2020 0525   CL 94 (L) 11/17/2020 0525   CO2 21 (L) 11/17/2020 0525   GLUCOSE 107 (H) 11/17/2020 0525   BUN 42 (H) 11/17/2020 0525   BUN 12 09/06/2020 1035   CREATININE 4.85 (H) 11/17/2020 0525   CALCIUM 7.7 (L) 11/17/2020 0525   GFRNONAA 8 (L) 11/17/2020 0525   GFRAA 20 (L) 11/26/2018 1346   Assessment/Planning: The patient is an 86 year old female with multiple medical  issues who presented to the [facility] emergency department with a ruptured but contained abdominal aortic aneurysm status post repair with a stent graft - POD#3   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 Neurology has been consulted  2) end-stage renal disease: Temporary dialysis catheter was inserted with Dr. Delana Meyer yesterday November 16, 2020 Nephrology is following  3) urinary tract infection: Currently being treated with IV antibiotics Could also be a contributing factor to the change in mental status however this is probably unlikely  Of note: The patient's husband was at the bedside today.  I had a long conversation with him in regard to vascular's operative and postoperative involvement (AAA repair).  We also discussed the new finding of a stroke and to expect the neurologist to come and see her at some point.  I tried to answer all the questions that he had.   Discussed with Dr. Eber Hong Marguarite Markov PA-C 11/17/2020 1:55 PM

## 2020-11-17 NOTE — Progress Notes (Signed)
K 5.7, NP aware

## 2020-11-17 NOTE — Progress Notes (Signed)
[facility], [place] 11/17/20  Subjective:   LOS: 4  Patient presented for Lower abdominal pain, Reported diarrhea and vomiting to ER staff HD on Mon-Fri Dx with AAA with intramural thrombus, Also suspicion for aneurysmal leak Underwent endovascular repair / stent graft placement  Patient continues to remain restless.  Not following commands this morning     Objective:  Vital signs in last 24 hours:  Temp:  [97.5 F (36.4 C)-99.4 F (37.4 C)] 97.9 F (36.6 C) (09/02 0400) Pulse Rate:  [57-146] 62 (09/02 0600) Resp:  [23-31] 26 (09/02 0600) BP: (78-124)/(40-67) 124/56 (09/02 0600) SpO2:  [58 %-100 %] 96 % (09/02 0600)  Weight change:  Filed Weights   11/12/2020 1500  Weight: 58.2 kg    Intake/Output:    Intake/Output Summary (Last 24 hours) at 11/17/2020 0824 Last data filed at 11/17/2020 0600 Gross per 24 hour  Intake 681.24 ml  Output --  Net 681.24 ml     Physical Exam: General:  No acute distress, laying in the bed  HEENT  anicteric, moist oral mucous membrane  Pulm/lungs  normal breathing effort, [place] O2, mild basilar crackles  CVS/Heart  regular rhythm, no rub or gallop  Abdomen:   Soft, nontender  Extremities: Trace peripheral edema  Neurologic:  Sleepy but arousable,  restless  Skin:  No acute rashes  Rt arm AVF    Basic Metabolic Panel:  Recent Labs  Lab 10/23/2020 1537 11/14/20 0611 11/15/20 0456 11/18/2020 0519 11/28/2020 2003 11/17/20 0018 11/17/20 0525  NA  --    < > 135 128* 128* 128*  128* 129*  K  --    < > 4.2 5.4* 6.2* 6.0*  6.0* 5.7*  CL  --    < > 98 92* 93* 94*  94* 94*  CO2  --    < > 24 21* 19* 19*  18* 21*  GLUCOSE  --    < > 57* 91 89 81  82 107*  BUN  --    < > 22 32* 39* 41*  40* 42*  CREATININE  --    < > 3.01* 4.04* 4.56* 4.76*  4.77* 4.85*  CALCIUM  --    < > 7.7* 8.0* 7.8* 7.7*  7.7* 7.7*  MG 1.8  --   --   --   --  2.4  --   PHOS 6.3*  --  6.5* 8.4* 9.3* 9.5* 9.9*   < > = values in this  interval not displayed.      CBC: Recent Labs  Lab 11/07/2020 1537 11/14/20 0611 11/15/20 0456 11/30/2020 0519 11/17/20 0525  WBC 7.5 6.7 5.9 5.8 4.2  NEUTROABS 6.0  --   --   --   --   HGB 11.3* 10.3* 9.4* 10.8* 10.2*  HCT 35.2* 31.1* 28.9* 32.4* 29.2*  MCV 89.3 91.7 88.4 89.3 85.6  PLT 152 110* 82* 101* 115*       Lab Results  Component Value Date   HEPBSAG NON REACTIVE 11/14/2020      Microbiology:  Recent Results (from the past 240 hour(s))  SARS CORONAVIRUS 2 (TAT 6-24 HRS) Nasopharyngeal Nasopharyngeal Swab     Status: None   Collection Time: 10/25/2020  9:46 AM   Specimen: Nasopharyngeal Swab  Result Value Ref Range Status   SARS Coronavirus 2 NEGATIVE NEGATIVE Final    Comment: (NOTE) SARS-CoV-2 target nucleic acids are NOT DETECTED.  The SARS-CoV-2 RNA is generally detectable in upper and lower  respiratory specimens during the acute phase of infection. Negative results do not preclude SARS-CoV-2 infection, do not rule out co-infections with other pathogens, and should not be used as the sole basis for treatment or other patient management decisions. Negative results must be combined with clinical observations, patient history, and epidemiological information. The expected result is Negative.  Fact Sheet for Patients: SugarRoll.be  Fact Sheet for Healthcare Providers: https://www.woods-mathews.com/  This test is not yet approved or cleared by the [place] FDA and  has been authorized for detection and/or diagnosis of SARS-CoV-2 by FDA under an Emergency Use Authorization (EUA). This EUA will remain  in effect (meaning this test can be used) for the duration of the COVID-19 declaration under Se ction 564(b)(1) of the Act, 21 U.S.C. section 360bbb-3(b)(1), unless the authorization is terminated or revoked sooner.  Performed at [facility], [place] [address] [place], [place] [address]   MRSA Next Gen  by PCR, Nasal     Status: None   Collection Time: 11/15/2020  3:09 PM   Specimen: Nasal Mucosa; Nasal Swab  Result Value Ref Range Status   MRSA by PCR Next Gen NOT DETECTED NOT DETECTED Final    Comment: (NOTE) The GeneXpert MRSA Assay (FDA approved for NASAL specimens only), is one component of a comprehensive MRSA colonization surveillance program. It is not intended to diagnose MRSA infection nor to guide or monitor treatment for MRSA infections. Test performance is not FDA approved in patients less than 25 years old. Performed at [facility], [address] [place], [place] [address]   Urine Culture     Status: Abnormal   Collection Time: 11/15/20  8:15 PM   Specimen: Urine, Random  Result Value Ref Range Status   Specimen Description   Final    URINE, RANDOM Performed at [facility], [address] [place], [place] [address]    Special Requests   Final    NONE Performed at [facility], [address] [place], [place] [address]    Culture MULTIPLE SPECIES PRESENT, SUGGEST RECOLLECTION (A)  Final   Report Status 11/17/2020 FINAL  Final  CULTURE, BLOOD (ROUTINE X 2) w Reflex to ID Panel     Status: None (Preliminary result)   Collection Time: 12/08/2020  2:56 PM   Specimen: BLOOD  Result Value Ref Range Status   Specimen Description BLOOD [facility]  Final   Special Requests   Final    BOTTLES DRAWN AEROBIC AND ANAEROBIC Blood Culture results may not be optimal due to an inadequate volume of blood received in culture bottles   Culture   Final    NO GROWTH < 24 HOURS Performed at [facility], [address], [place], [place] [address]    Report Status PENDING  Incomplete  CULTURE, BLOOD (ROUTINE X 2) w Reflex to ID Panel     Status: None (Preliminary result)   Collection Time: 11/30/2020  3:57 PM   Specimen: BLOOD  Result Value Ref Range Status   Specimen Description BLOOD [facility]  Final   Special Requests   Final    BOTTLES DRAWN AEROBIC  AND ANAEROBIC Blood Culture results may not be optimal due to an inadequate volume of blood received in culture bottles   Culture   Final    NO GROWTH < 24 HOURS Performed at [facility], [address] [place], [place] [address]    Report Status PENDING  Incomplete    Coagulation Studies: Recent Labs  11/15/20 0932  LABPROT 16.6*  INR 1.3*     Urinalysis: Recent Labs    11/15/20 0952  COLORURINE AMBER*  LABSPEC 1.031*  PHURINE 5.0  GLUCOSEU NEGATIVE  HGBUR LARGE*  BILIRUBINUR NEGATIVE  KETONESUR 5*  PROTEINUR 100*  NITRITE NEGATIVE  LEUKOCYTESUR MODERATE*       Imaging: DG Chest 1 View  Result Date: 11/17/2020 CLINICAL DATA:  Shortness of breath EXAM: CHEST  1 VIEW COMPARISON:  01/26/2018 FINDINGS: Multifocal patchy right lung opacities with volume loss. Small right pleural effusion. Left lung is clear. Cardiomegaly. IMPRESSION: Multifocal patchy right lung opacities, suspicious for pneumonia, although superimposed volume loss raises the possibility of atelectasis. Small right pleural effusion. Electronically Signed   By: Julian Hy M.D.   On: 11/17/2020 01:24   CT HEAD WO CONTRAST (5MM)  Result Date: 11/15/2020 CLINICAL DATA:  Mental status change EXAM: CT HEAD WITHOUT CONTRAST TECHNIQUE: Contiguous axial images were obtained from the base of the skull through the vertex without intravenous contrast. COMPARISON:  CT head 01/11/2020 FINDINGS: Brain: Generalized atrophy. Mild white matter hypodensity bilaterally. Negative for acute infarct, hemorrhage, or mass. Vascular: Negative for hyperdense vessel Skull: No acute skeletal abnormality Sinuses/Orbits: Mucosal edema and bony thickening around the right maxillary sinus. Prior sinus surgery. Prior cataract extraction. Other: None IMPRESSION: No acute abnormality. Electronically Signed   By: Franchot Gallo M.D.   On: 11/15/2020 15:23   MR BRAIN WO CONTRAST  Result Date: 11/30/2020 CLINICAL DATA:  Mental  status change, unknown cause. EXAM: MRI HEAD WITHOUT CONTRAST TECHNIQUE: Multiplanar, multiecho pulse sequences of the brain and surrounding structures were obtained without intravenous contrast. COMPARISON:  Head CT 11/15/2020 FINDINGS: The study is mildly motion degraded. Brain: There are subcentimeter acute infarcts in the right greater than left cerebellar hemispheres and left temporal lobe. There are chronic microhemorrhages in the right cerebellar hemisphere which are separate from the acute infarcts. A small focus of chronic hemorrhage is also noted in the ventral medulla. T2 hyperintensities in the cerebral white matter bilaterally are nonspecific but compatible with mild chronic small vessel ischemic disease. There is mild generalized cerebral atrophy. No mass, midline shift, or extra-axial fluid collection is identified. Vascular: Abnormal appearance of the proximal basilar artery. Skull and upper cervical spine: Unremarkable bone marrow signal. Sinuses/Orbits: Chronic right sphenoid sinusitis. Bilateral cataract extraction. No significant mastoid fluid. Other: None. IMPRESSION: 1. Small acute infarcts in the cerebellum and left temporal lobe. 2. Abnormal appearance of the proximal basilar artery which may reflect reduced flow from a stenosis or segmental occlusion occlusion. Head and neck CTA is recommended for further evaluation. 3. Mild chronic small vessel ischemic disease and cerebral atrophy. These results will be called to the ordering clinician or representative by the Radiologist Assistant, and communication documented in the PACS or [employer]. Electronically Signed   By: Logan Bores M.D.   On: 11/18/2020 19:15   MR THORACIC SPINE WO CONTRAST  Result Date: 11/25/2020 CLINICAL DATA:  Abnormal posture. EXAM: MRI THORACIC SPINE WITHOUT CONTRAST TECHNIQUE: Multiplanar, multisequence MR imaging of the thoracic spine was performed. No intravenous contrast was administered. COMPARISON:  None.  FINDINGS: The examination had to be discontinued prior to completion due to the patient's altered mental status. Sagittal T1, T2, and STIR sequences were obtained and are mildly to moderately motion degraded. No axial imaging was obtained. Alignment:  Mild scoliosis.  No significant listhesis. Vertebrae: Nonspecific diffuse bone marrow heterogeneity. No grossly destructive bone lesion. Mild T2 and moderate T5 and T11  compression fractures which are chronic. No acute fracture. Cord: Limited assessment due to motion and absence of axial imaging. No definite cord signal abnormality identified. Paraspinal and other soft tissues: Suggestion of right-sided paravertebral soft tissue edema or inflammation in the upper thoracic spine on the sagittal STIR sequence with assessment limited by incomplete coverage and lack of axial imaging (series 34, image 1). This may also involve the posterior right lung. Disc levels: Posterior disc osteophyte complex at T5-6 results in a mild impression on the ventral spinal cord. Mild disc bulging is present at multiple other levels in the thoracic spine. No significant spinal stenosis is evident. IMPRESSION: 1. Incomplete, motion degraded examination. 2. Grossly normal thoracic spinal cord signal. 3. Mild thoracic spondylosis without significant stenosis. 4. Possible right-sided upper thoracic paravertebral soft tissue edema or lung inflammation. Consider CT for further evaluation. Electronically Signed   By: Logan Bores M.D.   On: 11/30/2020 19:29   MR LUMBAR SPINE WO CONTRAST  Result Date: 12/08/2020 CLINICAL DATA:  Abnormal posture. EXAM: MRI LUMBAR SPINE WITHOUT CONTRAST TECHNIQUE: Multiplanar, multisequence MR imaging of the lumbar spine was performed. No intravenous contrast was administered. COMPARISON:  CTA abdomen and pelvis 11/15/2020 FINDINGS: The study is severely motion degraded. Segmentation: Standard. Alignment:  Normal. Vertebrae: Nonspecific diffuse bone marrow  heterogeneity. No grossly destructive bone lesion. Mild L2 and L3 compression fractures which are chronic. No acute fracture. Conus medullaris and cauda equina: Conus extends to the L1-2 level. Conus and cauda equina appear normal. Paraspinal and other soft tissues: Renal atrophy. Aortoiliac stent graft placement for treatment of an infrarenal abdominal aortic aneurysm, more fully evaluated on the recent CTA. Disc levels: Moderate to severe disc space narrowing at L5-S1 with preserved disc space heights elsewhere in the lumbar spine. Mostly mild disc bulging and facet hypertrophy throughout the lumbar spine without significant spinal stenosis. Mild bilateral neural foraminal stenosis at L3-4 and L4-5 and mild right and moderate left neural foraminal stenosis at L5-S1. IMPRESSION: 1. Severely motion degraded examination. 2. Chronic L2 and L3 compression fractures. 3. Lumbar disc degeneration greatest at L5-S1 where there is mild-to-moderate neural foraminal stenosis. 4. No spinal stenosis. Electronically Signed   By: Logan Bores M.D.   On: 11/28/2020 20:20   CT ANGIO AO+BIFEM W & OR WO CONTRAST  Result Date: 11/15/2020 CLINICAL DATA:  Claudication or leg ischemia, mental status change EXAM: CT ANGIOGRAPHY OF ABDOMINAL AORTA WITH ILIOFEMORAL RUNOFF TECHNIQUE: Multidetector CT imaging of the abdomen, pelvis and lower extremities was performed using the standard protocol during bolus administration of intravenous contrast. Multiplanar CT image reconstructions and MIPs were obtained to evaluate the vascular anatomy. CONTRAST:  152m OMNIPAQUE IOHEXOL 350 MG/ML SOLN COMPARISON:  11/02/2020 FINDINGS: VASCULAR Aorta: Interval deployment of bifurcated infrarenal stent graft which is patent. Lack of pre contrast and delayed scans limits sensitivity for endoleak. There is a small amount of high-density material within the excluded native aneurysm sac as well as some gas bubbles, presumably related to recent procedure.  Native aneurysm sac measures 7.1 x 6 cm maximum transverse dimensions (previously 7.3 x 6.3). No evidence of leak or rupture. Celiac: Patent without evidence of aneurysm, dissection, vasculitis or significant stenosis. SMA: Patent without evidence of aneurysm, dissection, vasculitis or significant stenosis. Replaced right hepatic arterial supply, an anatomic variant. Renals: Single right, patent. Origin occlusion of the left renal artery; there is early/partial collateral reconstitution of intraparenchymal branches. IMA: Short-segment origin occlusion, with distal reconstitution by visceral collaterals. RIGHT Lower Extremity Inflow: Right limb  of the stent graft extends to proximal external iliac, appears well apposed. Coil packing of the internal iliac artery with significant streak artifact degrading evaluation; distal branches remain patent. Outflow: Common femoral atheromatous but patent. Deep femoral branches patent. SFA patent. Popliteal patent proximally, occludes just above the knee. Runoff: High origin of the anterior tibial artery above the knee, pacified to the proximal calf. There is poor distal vascular opacification presumably due to scan timing, limiting evaluation of runoff. LEFT Lower Extremity Inflow: Left limb of the stent graft extends to the mid common iliac, well apposed. 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 External iliac atheromatous, patent. Outflow: Common femoral mildly atheromatous, patent. Deep femoral branches patent. SFA mildly atheromatous, patent. Popliteal artery atheromatous, patent. Runoff: Limited contrast enhancement presumably due to scan timing limits evaluation runoff. Veins: No obvious venous abnormality within the limitations of this arterial phase study. Review of the MIP images confirms the above findings. NON-VASCULAR Lower chest: Left lower lobe atelectasis/consolidation. Small pleural effusions. Hepatobiliary: At least 1 partially calcified subcentimeter  stone in the dependent aspect of the gallbladder. No discrete liver lesion or biliary ductal dilatation. Pancreas: Unremarkable. No pancreatic ductal dilatation or surrounding inflammatory changes. Spleen: Normal in size, with stable cyst. Adrenals/Urinary Tract: Adrenal glands unremarkable. Somewhat striated cortical enhancement of the right kidney which demonstrates mild diffuse parenchymal loss. More atrophic left kidney with some collateral reconstitution of arterial branches near the hilum, minimal parenchymal enhancement evident. No hydronephrosis or urolithiasis is evident. The urinary bladder is decompressed by Foley catheter. Stomach/Bowel: Stomach is nondistended. Small bowel decompressed. Scattered colonic diverticula without focal wall thickening or regional inflammatory change evident. Lymphatic: No abdominal or pelvic adenopathy. Reproductive: Status post hysterectomy. No adnexal masses. Other: No ascites.  No free air. Musculoskeletal: Stable compression deformities of T11, L2, and L3 vertebral bodies. No definite acute fracture or worrisome bone lesion. Osteitis pubis. IMPRESSION: 1. Patent infrarenal aortic stent graft with slight decrease in diameter of 7.1 cm native aneurysm sac. 2. Interval coil embolization of proximal RIGHT internal iliac artery. There is opacification of distal branches. 3. 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 4. RIGHT popliteal artery occlusion above the knee. Early scan timing relative to contrast bolus limits evaluation of tibial runoff. 5. Extensive left lower lobe consolidation/atelectasis with small pleural effusions. 6. Cholelithiasis 7. Colonic diverticulosis Electronically Signed   By: Lucrezia Europe M.D.   On: 11/15/2020 15:54   PERIPHERAL VASCULAR CATHETERIZATION  Result Date: 12/15/2020 See surgical note for result.    Medications:    sodium chloride Stopped (11/15/20 1129)   sodium chloride Stopped (12/14/2020 1620)   cefTRIAXone  (ROCEPHIN)  IV Stopped (11/17/20 0014)   dexmedetomidine (PRECEDEX) IV infusion     magnesium sulfate bolus IVPB     norepinephrine (LEVOPHED) Adult infusion 3 mcg/min (11/17/20 0600)    Chlorhexidine Gluconate Cloth  6 each Topical Q0600   docusate sodium  100 mg Oral Daily   epoetin (EPOGEN/PROCRIT) injection  4,000 Units Intravenous Q M,W,F-HD   famotidine  20 mg Intravenous Daily   rOPINIRole  0.5 mg Oral q AM   rOPINIRole  1 mg Oral QHS   acetaminophen **OR** acetaminophen, alum & mag hydroxide-simeth, guaiFENesin-dextromethorphan, hydrALAZINE, HYDROmorphone (DILAUDID) injection, labetalol, magnesium sulfate bolus IVPB, metoprolol tartrate, ondansetron (ZOFRAN) IV, phenol, potassium chloride  Assessment/ Plan:  86 y.o. female with ESRD, h/o lupus nephritis, A flutter, GERD, was admitted on 10/19/2020 for  Active Problems:   Ruptured abdominal aortic aneurysm (  AAA) ([place])   AAA (abdominal aortic aneurysm) ([place])   Aneurysm, abdominal aortic, with rupture ([place])  Abdominal aortic aneurysm (AAA) without rupture ([facility]) [I71.4] Ruptured abdominal aortic aneurysm (AAA) ([place]) [I71.3] AAA (abdominal aortic aneurysm) ([place]) [I71.4] Aneurysm, abdominal aortic, with rupture ([place]) [I71.3]  #. ESRD, MWF Hemodialysis today  Mild hyperkalemia -expected to correct with dialysis Not able to take PO [place] due to risk of aspiration Multiple recent IV contrast exposures.  Previously M-F schedule but recommend to switch to MWF schedule going forward  #. Anemia of CKD  Lab Results  Component Value Date   HGB 10.2 (L) 11/17/2020   Low dose EPO with HD for Hgb < 11  #. Secondary hyperparathyroidism of renal origin N 25.81   No results found for: PTH Lab Results  Component Value Date   PHOS 9.9 (H) 11/17/2020   Monitor calcium and phos level during this admission   #.  AAA, contained rupture S/p emergent endovascular repair on 12/14/20 Angiogram could not be completed because of altered  mental status  # Acidosis Elevated lactic acid level and elevated CK Concern about ischemic injury  Overall prognosis appears to be poor    LOS: [place] 9/2/20228:24 AM  [place], [place]

## 2020-11-17 NOTE — Progress Notes (Signed)
Tolerated 3 hours of HD treatment.  UF goal of .5L achieved.  Remains agitated and restless, family at bedside, report to primary RN

## 2020-11-17 NOTE — Progress Notes (Signed)
Eeg done 

## 2020-11-17 NOTE — Procedures (Signed)
History: 86 yo F with AMS  Sedation: precedex  Technique: This EEG was acquired with electrodes placed according to the International 10-20 electrode system (including Fp1, Fp2, F3, F4, C3, C4, P3, P4, O1, O2, T3, T4, T5, T6, A1, A2, Fz, Cz, Pz). The following electrodes were missing or displaced: none.   Background: The background consists of generalized irregular predominantly delta range slow activity. There eis no clear PDR seen. There are occasional runs consistent with sleep structures which are symmetric.   Photic stimulation: Physiologic driving is not performed  EEG Abnormalities: 1) Generalized irregular slow activity 2) Absent PDR  Clinical Interpretation: This EEG is consistent with a generalized non-specific cerebral dysfunction(encephalopathy). There was no seizure or seizure predisposition recorded on this study. Please note that lack of epileptiform activity on EEG does not preclude the possibility of epilepsy.   Roland Rack, MD [facility] [phone]  If 7pm- 7am, please page neurology on call as listed in [place].

## 2020-11-17 NOTE — Progress Notes (Addendum)
NAME:  Marissa Hess, MRN:  [id number], DOB:  Apr 07, 1934, LOS: 4 ADMISSION DATE:  10/19/2020, CONSULTATION DATE: 10/22/2020 REFERRING MD: Dr. Delana Meyer CHIEF COMPLAINT: Ruptured AAA  History of Present Illness:  This is an 86 yo female who presented to [facility] ER on 08/29 with c/o lower back/abdominal pain and watery diarrhea onset of symptoms 2 weeks prior to presentation.  She does have a hx of ESRD on hemodialysis M/F and missed her HD session the morning of 08/29.    ED course: Upon arrival to the ER lab results revealed BUN 28, creatinine 3.96, albumin 3.1, and hgb 11.5.  CT Abd Pelvis revealed a large intrarenal AAA with periaortic edema concerning for small leak.  Pt also hypertensive with sbp 160-180's.  Pt placed on nicardipine gtt for bp control and vascular surgery consulted. Per vascular surgery recommendations Eliquis (hx atrial flutter) reversed with Andexxa and pt transported to the OR for emergent selective angiography, coil embolization, and stent graft placement.  Pt admitted to ICU postop and PCCM team consulted to assist with management.    Pertinent  Medical History  Allergies  Arthritis  ESRD on HD: M/F GERD  HTN Lupus Lupus nephritis  Left Eye Macular Degeneration  MI (2007) Osteoporosis  Atrial Flutter Oropharyngeal Dysphagia    Significant Hospital Events: Including procedures, antibiotic start and stop dates in addition to other pertinent events   08/29: Pt admitted to ICU s/p emergent selective angiography, coil embolization and stent graft placement of AAA, juxtarenal    with contained rupture  08/30: Hemodynamically stable, BP controlled (no IV  antihypertensives being required), with ACUTE METABOLIC ENCEPHALOPATHY (? Pain meds vs ICU delirium) 8/31: Obtaining CT Head and UA for AMS, CTA abdomen &  pelvis with runoff due to LLE pain;  with worsening Thrombocytopenia, obtaining workup 9/1: UA consistent with UTI, placed on Ceftriaxone, plan for MRI and  considering Neurology consult, Vascular Surgery planning for Angiogram of LLE 9/1: MR Brain revealed small acute infarcts in the cerebellum and left temporal lobe. Abnormal appearance of the proximal basilar artery which may reflect reduced flow from a stenosis or segmental occlusion Head and neck CTA is recommended for further evaluation. Mild chronic small vessel ischemic disease and cerebral atrophy. 09/1 MR Lumbar Thoracic Spine: revealed Severely motion degraded examination. Chronic L2 and L3 compression fractures. Lumbar disc degeneration greatest at L5-S1 where there is mild- mild-to-moderate neural foraminal stenosis. No spinal stenosis.  Interim History / Subjective:  Pt with continued severe delirium spontaneous movement of bilateral upper extremities, however no movement of lower extremities.  Bilateral lower extremity below knee cold to touch and mottled with absent of dorsalis pedal pulses but able to doppler popliteal pulses.    Objective   Blood pressure (!) 124/56, pulse 62, temperature 97.9 F (36.6 C), temperature source Axillary, resp. rate (!) 26, height '5\' 3"'$  (1.6 m), weight 58.2 kg, SpO2 96 %.        Intake/Output Summary (Last 24 hours) at 11/17/2020 1210 Last data filed at 11/17/2020 0600 Gross per 24 hour  Intake 681.24 ml  Output --  Net 681.24 ml   Filed Weights   11/07/2020 1500  Weight: 58.2 kg    Examination: General: Acute on chronically  ill appearing elderly female with restlessness/agitation  HENT: Atraumatic, normocephalic, neck supple, no JVD Lungs: faint rhonchi throughout, even, non labored  Cardiovascular: nsr, rrr, no R/G, 2+ radial/ doppler popliteal pulses present/absent dorsalis pedis pulses, no edema  Abdomen: +BS x4, obese, soft, non  tender, non distended  Extremities: unable to move bilateral lower extremities, bilateral lower extremities from the knee down cold to touch and mottled  Skin: bilateral groin dressings in place no bleeding or  hematoma present; RUE fistula +bruit and thrill  Neuro: awake with severe delirium, unable to follow commands, spontaneously moves upper extremities, unable to move bilateral lower extremities, pupils PERRL GU: indwelling foley in place draining small amount of yellow urine   Resolved Hospital Problem list     Assessment & Plan:   AAA, juxtarenal with contained rupture s/p emergent angiography, coil embolization, and stent graft placement  LLE Rest pain Hypotension secondary to hypovolemia vs. sepsis  Hx: Atrial flutter/fibrillation, Hypertension -Vascular Surgery following, appreciate input ~ will follow recommendations -Continuous cardiac monitoring -Prn levophed gtt to maintain map >65 -Monitor for s/sx of bleeding -Holding outpatient Eliquis -Hold outpatient carvedilol due to soft BP -Completed Clindamycin for surgical prophylaxis  -Vascular surgery attempted to proceed with angiogram due to CTA AO+Bifem revealing right popliteal artery occlusion above the knee 09/1, however unable to proceed due to hypotension  -CT Lumbar and Sacral Spine pending to r/o infarct due to concern of bilateral lower extremity ischemia  ESRD on Hemodialysis  Hyperkalemia Hyponatremia, suspect due to D5W infusion~slowly improving  -Monitor I&O's / urinary output -Follow BMP -Ensure adequate renal perfusion -Avoid nephrotoxic agents as able -Replace electrolytes as indicated -Nephrology following, appreciate input -HD as per Nephrology recommendations   Anemia of Chronic Disease Thrombocytopenia>>slolwly improving -Monitor for S/Sx of bleeding -Trend CBC - Iron/TIBC -SCD's for VTE Prophylaxis (no chemical prophylaxis until approved by Vascular Surgery, hold outpatient Eliquis) -Transfuse for Hgb <7 -Transfuse platelets for platelet count of <50 with active bleeding -Thrombocytopenia workup not concerning for DIC or TTP, peripheral smear normal (suspect thrombocytopenia is due to recent AAA with  thrombus and recent repair) -HIT panel/serotonin assay negative   Acute Metabolic Encephalopathy~worsening  CT Head negative 8/31 CTA Head Neck 09/2 revealed occlusion of a right P2 PCA branch immediately distal to a bifurcation. Severe left M1 MCA stenosis. Moderate to severe left paraclinoid ICA stenosis. Severe right and moderate left A3 ACA stenosis. Moderate proximal right M2 MCA branch stenosis. 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 Otherwise, no evidence of interval acute abnormality -Provide supportive care -Promote normal sleep/wake cycle -Neurology consulted appreciate input-encephalopathy multifactorial  -CK, TSH, Thyroid Panel, Cortisol Level, Ammonia Level, VBG, and SRA pending -Will start Precedex gtt for severe delirium/agitation -EEG pending  Urinary Tract Infection -Monitor fever curve -Trend WBC's & Procalcitonin -Follow cultures as above -Continue empiric Ceftriaxone will repeat urine culture due to multiple species suggesting recollection 09/2  Hypoglycemia>>improving -CBG's q4h; Target range of >90 -Follow ICU Hypo/Hyperglycemia protocol   Best Practice (right click and "Reselect all SmartList Selections" daily)   Diet/type: NPO DVT prophylaxis: SCD GI prophylaxis: Pepcid Lines: N/A Foley:  Yes; orders placed to discontinue 11/17/2020 Code Status:  full code Last date of multidisciplinary goals of care discussion [11/17/2020]  Pts 2 daughters and husband updated at bedside and all questions were answered 09/2.  Discussed pt prognosis and code status at this time they are continuing to have discussions regarding code status.  Labs   CBC: Recent Labs  Lab 10/27/2020 1537 11/14/20 0611 11/15/20 0456 12/09/2020 0519 11/17/20 0525  WBC 7.5 6.7 5.9 5.8 4.2  NEUTROABS 6.0  --   --   --   --  HGB 11.3* 10.3* 9.4* 10.8* 10.2*  HCT  35.2* 31.1* 28.9* 32.4* 29.2*  MCV 89.3 91.7 88.4 89.3 85.6  PLT 152 110* 82* 101* 115*    Basic Metabolic Panel: Recent Labs  Lab 10/21/2020 1537 11/14/20 0611 11/15/20 0456 11/27/2020 0519 12/04/2020 2003 11/17/20 0018 11/17/20 0525  NA  --    < > 135 128* 128* 128*  128* 129*  K  --    < > 4.2 5.4* 6.2* 6.0*  6.0* 5.7*  CL  --    < > 98 92* 93* 94*  94* 94*  CO2  --    < > 24 21* 19* 19*  18* 21*  GLUCOSE  --    < > 57* 91 89 81  82 107*  BUN  --    < > 22 32* 39* 41*  40* 42*  CREATININE  --    < > 3.01* 4.04* 4.56* 4.76*  4.77* 4.85*  CALCIUM  --    < > 7.7* 8.0* 7.8* 7.7*  7.7* 7.7*  MG 1.8  --   --   --   --  2.4  --   PHOS 6.3*  --  6.5* 8.4* 9.3* 9.5* 9.9*   < > = values in this interval not displayed.   GFR: Estimated Creatinine Clearance: 7.1 mL/min (A) (by C-G formula based on SCr of 4.85 mg/dL (H)). Recent Labs  Lab 11/14/20 [phone] 11/14/20 1730 11/15/20 0456 11/21/2020 0519 11/29/2020 0818 12/04/2020 1107 12/05/2020 1457 11/24/2020 2003 11/17/20 0018 11/17/20 0525  PROCALCITON  --   --   --   --   --   --  15.40  --   --  16.79  WBC 6.7  --  5.9 5.8  --   --   --   --   --  4.2  LATICACIDVEN  --    < >  --   --  4.2* 2.8*  --  3.5* 2.3*  --    < > = values in this interval not displayed.    Liver Function Tests: Recent Labs  Lab 10/30/2020 0615 11/15/20 0456 12/01/2020 0519 11/22/2020 2003 11/17/20 0018 11/17/20 0525  AST 17  --   --   --   --   --   ALT 10  --   --   --   --   --   ALKPHOS 47  --   --   --   --   --   BILITOT 1.3*  --   --   --   --   --   PROT 7.1  --   --   --   --   --   ALBUMIN 3.1* 2.4* 2.2* 2.1* 2.1* 2.1*   Recent Labs  Lab 10/24/2020 0615  LIPASE 27   No results for input(s): AMMONIA in the last 168 hours.  ABG    Component Value Date/Time   HCO3 16.9 (L) 11/17/2020 0038   ACIDBASEDEF 8.1 (H) 11/17/2020 0038   O2SAT 78.1 11/17/2020 0038     Coagulation Profile: Recent Labs  Lab 10/29/2020 0946 11/15/20 0932  INR 1.2  1.3*    Cardiac Enzymes: Recent Labs  Lab 11/14/20 1730  CKTOTAL 5,364*    HbA1C: No results found for: HGBA1C  CBG: Recent Labs  Lab 11/17/20 0404 11/17/20 0537 11/17/20 0611 11/17/20 0754 11/17/20 1130  GLUCAP 67* 59* 81 104* 107*    Review of Systems: Positives in BOLD   Unable  to assess due to AMS  Past Medical History:  She,  has a past medical history of Allergy, Arthritis, Chronic kidney disease, GERD (gastroesophageal reflux disease), Hypertension, Lupus ([place]) (2002), Macular degeneration of left eye, Myocardial infarction ([place]) (2007), and Osteoporosis.   Surgical History:   Past Surgical History:  Procedure Laterality Date   A/V FISTULAGRAM Right 01/16/2018   Procedure: A/V FISTULAGRAM;  Surgeon: [name], MD;  Location: [place] [facility];  Service: Cardiovascular;  Laterality: Right;   ABDOMINAL HYSTERECTOMY     AV FISTULA PLACEMENT Right 12/17/2017   Procedure: ARTERIOVENOUS (AV) FISTULA CREATION ( BRACHIO CEPHALIC POSS. BRACHIO BASILIC );  Surgeon: Algernon Huxley, MD;  Location: [facility];  Service: Vascular;  Laterality: Right;   CARDIOVERSION N/A 10/04/2020   Procedure: CARDIOVERSION;  Surgeon: Kate Sable, MD;  Location: [facility];  Service: Cardiovascular;  Laterality: N/A;   CATARACT EXTRACTION W/PHACO Right 01/03/2016   Procedure: CATARACT EXTRACTION PHACO AND INTRAOCULAR LENS PLACEMENT ([place]);  Surgeon: Leandrew Koyanagi, MD;  Location: [place];  Service: Ophthalmology;  Laterality: Right;   COLON SURGERY     intestine became twisted after hernia sugery   ENDOVASCULAR REPAIR/STENT GRAFT N/A 10/31/2020   Procedure: ENDOVASCULAR REPAIR/STENT GRAFT;  Surgeon: Katha Cabal, MD;  Location: [place] [facility];  Service: Cardiovascular;  Laterality: N/A;   EYE SURGERY     cataract   HERNIA REPAIR     TEMPORAL ARTERY BIOPSY / LIGATION     TEMPORARY DIALYSIS CATHETER N/A 12/11/2020   Procedure: TEMPORARY DIALYSIS CATHETER;   Surgeon: Katha Cabal, MD;  Location: [place] [facility];  Service: Cardiovascular;  Laterality: N/A;   TONSILLECTOMY       Social History:   reports that she quit smoking about 26 years ago. Her smoking use included cigarettes. She started smoking about 36 years ago. She has never used smokeless tobacco. She reports that she does not drink alcohol and does not use drugs.   Family History:  Her family history includes Rheum arthritis in her father; Stomach cancer in her mother; Thyroid disease in her daughter; Varicose Veins in her maternal aunt. There is no history of Breast cancer.   Allergies Allergies  Allergen Reactions   Codeine Itching   Ibuprofen     Avoids "because of lupus"   Morphine And Related Other (See Comments)    Confusion/Agitation   Sulfa Antibiotics Diarrhea   Vioxx [Rofecoxib]     Avoids "because of lupus"   Celebrex [Celecoxib] Rash    Avoids "because of lupus"   Penicillins Rash    Reaction: over 30 years ago     Home Medications  Prior to Admission medications   Medication Sig Start Date End Date Taking? Authorizing Provider  apixaban (ELIQUIS) 2.5 MG TABS tablet Take 1 tablet (2.5 mg total) by mouth 2 (two) times daily. 09/06/20   Cannady, Henrine Screws T, NP  carvedilol (COREG) 25 MG tablet Take 1 tablet (25 mg total) by mouth 2 (two) times daily with a meal. 11/12/19   Cannady, Jolene T, NP  cholecalciferol (VITAMIN D) 1000 units tablet Take 1,000 Units by mouth daily.     [provider]  ferrous sulfate (SLOW FE) 160 (50 Fe) MG TBCR SR tablet Take 160 mg by mouth daily.    [provider]  folic acid (FOLVITE) 1 MG tablet Take 1 tablet (1 mg total) by mouth daily. 12/09/19   Johnson, Megan P, DO  lidocaine (LIDODERM) 5 % Place  1 patch onto the skin daily. Remove & Discard patch within 12 hours or as directed by MD Patient not taking: Reported on 11/15/2020 11/08/20   Marnee Guarneri T, NP  mirtazapine (REMERON) 7.5 MG tablet Take 7.5 mg  by mouth at bedtime. 08/31/20   [provider]  rOPINIRole (REQUIP) 0.5 MG tablet Take one tablet (0.5 MG) by mouth in the morning and then take two tablets (1 MG) by mouth at night before bedtime. Patient taking differently: Take 0.5-1 mg by mouth See admin instructions. Take one tablet (0.5 MG) by mouth in the morning and then take two tablets (1 MG) by mouth at night before bedtime. 01/11/20   Cannady, Henrine Screws T, NP  tiZANidine (ZANAFLEX) 2 MG tablet Take 1 tablet (2 mg total) by mouth daily as needed for muscle spasms. 11/12/2020   Venita Lick, NP     Critical care time: 45 minutes     Rosilyn Mings, AGNP  Pulmonary/Critical Care Pager [phone] (please enter 7 digits) PCCM Consult Pager [phone] (please enter 7 digits)

## 2020-11-18 ENCOUNTER — Inpatient Hospital Stay: Payer: Medicare Other

## 2020-11-18 DIAGNOSIS — I639 Cerebral infarction, unspecified: Secondary | ICD-10-CM

## 2020-11-18 DIAGNOSIS — G9340 Encephalopathy, unspecified: Secondary | ICD-10-CM | POA: Diagnosis not present

## 2020-11-18 DIAGNOSIS — I713 Abdominal aortic aneurysm, ruptured: Secondary | ICD-10-CM | POA: Diagnosis not present

## 2020-11-18 DIAGNOSIS — N390 Urinary tract infection, site not specified: Secondary | ICD-10-CM

## 2020-11-18 DIAGNOSIS — J9601 Acute respiratory failure with hypoxia: Secondary | ICD-10-CM

## 2020-11-18 DIAGNOSIS — D696 Thrombocytopenia, unspecified: Secondary | ICD-10-CM

## 2020-11-18 LAB — RENAL FUNCTION PANEL
Albumin: 1.9 g/dL — ABNORMAL LOW (ref 3.5–5.0)
Anion gap: 13 (ref 5–15)
BUN: 32 mg/dL — ABNORMAL HIGH (ref 8–23)
CO2: 23 mmol/L (ref 22–32)
Calcium: 7.5 mg/dL — ABNORMAL LOW (ref 8.9–10.3)
Chloride: 96 mmol/L — ABNORMAL LOW (ref 98–111)
Creatinine, Ser: 3.87 mg/dL — ABNORMAL HIGH (ref 0.44–1.00)
GFR, Estimated: 11 mL/min — ABNORMAL LOW (ref >60)
Glucose, Bld: 99 mg/dL (ref 70–99)
Phosphorus: 7.5 mg/dL — ABNORMAL HIGH (ref 2.5–4.6)
Potassium: 5 mmol/L (ref 3.5–5.1)
Sodium: 132 mmol/L — ABNORMAL LOW (ref 135–145)

## 2020-11-18 LAB — MAGNESIUM: Magnesium: 2.2 mg/dL (ref 1.7–2.4)

## 2020-11-18 LAB — CBC
HCT: 28.2 % — ABNORMAL LOW (ref 36.0–46.0)
Hemoglobin: 9.7 g/dL — ABNORMAL LOW (ref 12.0–15.0)
MCH: 28.9 pg (ref 26.0–34.0)
MCHC: 34.4 g/dL (ref 30.0–36.0)
MCV: 83.9 fL (ref 80.0–100.0)
Platelets: 98 10*3/uL — ABNORMAL LOW (ref 150–400)
RBC: 3.36 MIL/uL — ABNORMAL LOW (ref 3.87–5.11)
RDW: 19.7 % — ABNORMAL HIGH (ref 11.5–15.5)
WBC: 7.6 10*3/uL (ref 4.0–10.5)
nRBC: 0.7 % — ABNORMAL HIGH (ref 0.0–0.2)

## 2020-11-18 LAB — VITAMIN B12: Vitamin B-12: 2012 pg/mL — ABNORMAL HIGH (ref 180–914)

## 2020-11-18 LAB — PROCALCITONIN: Procalcitonin: 13.35 ng/mL

## 2020-11-18 LAB — THYROID PANEL
Free Thyroxine Index: 1.2 (ref 1.2–4.9)
T3 Uptake Ratio: 32 % (ref 24–39)
T4, Total: 3.7 ug/dL — ABNORMAL LOW (ref 4.5–12.0)

## 2020-11-18 LAB — GLUCOSE, CAPILLARY
Glucose-Capillary: 106 mg/dL — ABNORMAL HIGH (ref 70–99)
Glucose-Capillary: 114 mg/dL — ABNORMAL HIGH (ref 70–99)
Glucose-Capillary: 61 mg/dL — ABNORMAL LOW (ref 70–99)
Glucose-Capillary: 87 mg/dL (ref 70–99)
Glucose-Capillary: 94 mg/dL (ref 70–99)
Glucose-Capillary: 98 mg/dL (ref 70–99)

## 2020-11-18 MED ORDER — DEXTROSE 50 % IV SOLN
INTRAVENOUS | Status: AC
  Administered 2020-11-18: 12.5 g via INTRAVENOUS
  Filled 2020-11-18: qty 50

## 2020-11-18 MED ORDER — DEXTROSE 50 % IV SOLN: 12.5000 g | Freq: Once | INTRAVENOUS | Status: AC

## 2020-11-18 MED ORDER — MIDAZOLAM HCL 2 MG/2ML IJ SOLN
1.0000 mg | Freq: Once | INTRAMUSCULAR | Status: AC
  Administered 2020-11-18: 1 mg via INTRAVENOUS

## 2020-11-18 MED ORDER — MIDAZOLAM HCL 2 MG/2ML IJ SOLN
INTRAMUSCULAR | Status: AC
  Filled 2020-11-18: qty 2

## 2020-11-18 NOTE — Progress Notes (Signed)
NAME:  Marissa Hess, MRN:  [id number], DOB:  April 17, 1934, LOS: 5 ADMISSION DATE:  11/03/2020, CONSULTATION DATE: 11/03/2020 REFERRING MD: Dr. Delana Meyer CHIEF COMPLAINT: Ruptured AAA  History of Present Illness:  This is an 86 yo female who presented to [facility] ER on 08/29 with c/o lower back/abdominal pain and watery diarrhea onset of symptoms 2 weeks prior to presentation.  She does have a hx of ESRD on hemodialysis M/F and missed her HD session the morning of 08/29.    ED course: Upon arrival to the ER lab results revealed BUN 28, creatinine 3.96, albumin 3.1, and hgb 11.5.  CT Abd Pelvis revealed a large intrarenal AAA with periaortic edema concerning for small leak.  Pt also hypertensive with sbp 160-180's.  Pt placed on nicardipine gtt for bp control and vascular surgery consulted. Per vascular surgery recommendations Eliquis (hx atrial flutter) reversed with Andexxa and pt transported to the OR for emergent selective angiography, coil embolization, and stent graft placement.  Pt admitted to ICU postop and PCCM team consulted to assist with management.    Pertinent  Medical History  Allergies  Arthritis  ESRD on HD: M/F GERD  HTN Lupus Lupus nephritis  Left Eye Macular Degeneration  MI (2007) Osteoporosis  Atrial Flutter Oropharyngeal Dysphagia    Significant Hospital Events: Including procedures, antibiotic start and stop dates in addition to other pertinent events   08/29: Pt admitted to ICU s/p emergent selective angiography, coil embolization and stent graft placement of AAA, juxtarenal    with contained rupture  08/30: Hemodynamically stable, BP controlled (no IV  antihypertensives being required), with ACUTE METABOLIC ENCEPHALOPATHY (? Pain meds vs ICU delirium) 8/31: Obtaining CT Head and UA for AMS, CTA abdomen &  pelvis with runoff due to LLE pain;  with worsening Thrombocytopenia, obtaining workup 9/1: UA consistent with UTI, placed on Ceftriaxone, plan for MRI and  considering Neurology consult, Vascular Surgery planning for Angiogram of LLE 9/1: MR Brain revealed small acute infarcts in the cerebellum and left temporal lobe. Abnormal appearance of the proximal basilar artery which may reflect reduced flow from a stenosis or segmental occlusion Head and neck CTA is recommended for further evaluation. Mild chronic small vessel ischemic disease and cerebral atrophy. 09/1 MR Lumbar Thoracic Spine: revealed Severely motion degraded examination. Chronic L2 and L3 compression fractures. Lumbar disc degeneration greatest at L5-S1 where there is mild- mild-to-moderate neural foraminal stenosis. No spinal stenosis. 09/2 CT Lumbar Spine: Redemonstrated chronic vertebral compression fractures at T11, L2 and L3. Lumbar spondylosis, as described on the recent prior lumbar spine. MRI of 11/30/2020. Please refer to this prior report for further description. Thoracolumbar levocurvature. Trace T11-T12 and L1-L2 grade 1 retrolisthesis. Aortoiliac stent graft for abdominal aortic aneurysm repair. There is persistent enhancement of the right kidney from the CT angiogram head/neck performed earlier today. This may be secondary to chronic renal disease. However, renal cortical necrosis can not be excluded given the patient's history. Extensive airspace consolidation within the imaged right lung base. Correlate for pneumonia. Mild dependent atelectasis and likely small pleural effusion within the imaged left lung base. Nonspecific small volume abdominopelvic free fluid. Sigmoid diverticulosis. 09/2 CT Pelvis: Stable appearance of the visualized bifurcated aortoiliac stent graft. Stable aneurysm sac with punctate foci of gas possibly postprocedural in nature. Patency of the vasculature is not well assessed on this noncontrast examination. Interval development of trace hemoperitoneum. Progressive body wall edema, possibly related to progressive anasarca. More focal infiltration within the left  gluteal region may  represent superimposed edema or interstitial hemorrhage in the setting of trauma and clinical correlation may be helpful. No acute bone abnormality. Cholelithiasis. Diverticulosis. Aortic Atherosclerosis (ICD10-I70.0). Aortic aneurysm NOS (ICD10-I71.9). 09/2: EEG>>consistent with a generalized non-specific cerebral  dysfunction(encephalopathy). There was no seizure or seizure predisposition recorded on this study. Please note that lack of epileptiform activity on EEG does not preclude the possibility of epilepsy 09/3: Attempted to place Dobbhoff and Nasogastric tube, however unsuccessful due to inability of pt to swallow on command.  Therefore, placed order for IR to place Dobbhoff vs. G tube   Interim History / Subjective:  Pt remains sedated on precedex gtt and appears comfortable.  No acute events overnight   Objective   Blood pressure (!) 105/54, pulse (!) 59, temperature 97.6 F (36.4 C), temperature source Axillary, resp. rate 18, height '5\' 3"'$  (1.6 m), weight 58.2 kg, SpO2 100 %.        Intake/Output Summary (Last 24 hours) at 11/18/2020 [id number] Last data filed at 11/18/2020 0600 Gross per 24 hour  Intake 658.22 ml  Output 500 ml  Net 158.22 ml   Filed Weights   11/06/2020 1500  Weight: 58.2 kg    Examination: General: Acute on chronically  ill appearing elderly female with restlessness/agitation  HENT: Atraumatic, normocephalic, neck supple, no JVD Lungs: faint rhonchi throughout, even, non labored with nasal trumpet in left nare   Cardiovascular: nsr, rrr, no R/G, 2+ radial/absent dorsalis pedis, posterior tibial, popliteal pulse/1+ femoral pulses present via doppler, no edema  Abdomen: +BS x4, obese, soft, non tender, non distended  Extremities: unable to move bilateral lower extremities, bilateral lower extremities from the knee down cold to touch and mottled  Skin: bilateral groin dressings in place no bleeding or hematoma present; RUE fistula +bruit and thrill   Neuro: sedated, unable to follow commands, spontaneously moves upper extremities, unable to move bilateral lower extremities, pupils PERRL GU: indwelling foley discontinue 09/2  Resolved Hospital Problem list     Assessment & Plan:   AAA, juxtarenal with contained rupture s/p emergent angiography, coil embolization, and stent graft placement  LLE Rest pain Hypotension secondary to hypovolemia vs. sepsis  Hx: Atrial flutter/fibrillation, Hypertension -Vascular Surgery following, appreciate input ~ will follow recommendations -Continuous cardiac monitoring -Prn levophed gtt to maintain map >65 -Monitor for s/sx of bleeding -Holding outpatient Eliquis -Hold outpatient carvedilol due to soft BP -Completed Clindamycin for surgical prophylaxis  -Vascular surgery attempted to proceed with angiogram due to CTA AO+Bifem revealing right popliteal artery occlusion above the knee 09/1, however unable to proceed due to hypotension  -Will await vascular surgery recommendations regarding pts continued inability to move bilateral lower extremities  ESRD on Hemodialysis  Hyperkalemia Hyponatremia, suspect due to D5W infusion~slowly improving  -Monitor I&O's / urinary output -Follow BMP -Ensure adequate renal perfusion -Avoid nephrotoxic agents as able -Replace electrolytes as indicated -Nephrology following, appreciate input -HD as per Nephrology recommendations   Anemia of Chronic Disease Thrombocytopenia>>slolwly improving -Monitor for S/Sx of bleeding -Trend CBC - Iron/TIBC -SCD's for VTE Prophylaxis (no chemical prophylaxis until approved by Vascular Surgery, hold outpatient Eliquis) -Transfuse for Hgb <7 -Transfuse platelets for platelet count of <50 with active bleeding -Thrombocytopenia workup not concerning for DIC or TTP, peripheral smear normal (suspect thrombocytopenia is due to recent AAA with thrombus and recent repair) -HIT panel/serotonin assay negative   Acute Metabolic  Encephalopathy~worsening  CT Head negative 8/31 CTA Head Neck 09/2 revealed occlusion of a right P2 PCA branch immediately distal to a bifurcation.  Severe left M1 MCA stenosis. Moderate to severe left paraclinoid ICA stenosis. Severe right and moderate left A3 ACA stenosis. Moderate proximal right M2 MCA branch stenosis. 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 Otherwise, no evidence of interval acute abnormality -Provide supportive care -Promote normal sleep/wake cycle -Neurology consulted appreciate input-encephalopathy multifactorial  -Continue Precedex gtt for severe delirium/agitation  Urinary Tract Infection -Monitor fever curve -Trend WBC's & Procalcitonin -Follow cultures as above -Continue empiric Ceftriaxone for now awaiting urine culture results  Hypoglycemia>>improving -CBG's q4h; Target range of >90 -Follow ICU Hypo/Hyperglycemia protocol   Best Practice (right click and "Reselect all SmartList Selections" daily)   Diet/type: NPO; IR consulted for Dobbhoff placement utilizing fluoro vs. G tube placement  DVT prophylaxis: SCD GI prophylaxis: Pepcid Lines: N/A Foley:  Yes; orders placed to discontinue 11/17/2020 Code Status:  full code Last date of multidisciplinary goals of care discussion [11/17/2020]  Pts 2 daughters and husband updated regarding plan of care and all questions were answered.  CBC: Recent Labs  Lab 10/20/2020 1537 11/14/20 0611 11/15/20 0456 11/18/2020 0519 11/17/20 0525 11/18/20 0436  WBC 7.5 6.7 5.9 5.8 4.2 7.6  NEUTROABS 6.0  --   --   --   --   --   HGB 11.3* 10.3* 9.4* 10.8* 10.2* 9.7*  HCT 35.2* 31.1* 28.9* 32.4* 29.2* 28.2*  MCV 89.3 91.7 88.4 89.3 85.6 83.9  PLT 152 110* 82* 101* 115* 98*    Basic Metabolic Panel: Recent Labs  Lab 11/05/2020 1537 11/14/20 0611 11/30/2020 0519 12/05/2020 2003 11/17/20 0018  11/17/20 0525 11/17/20 1142 11/17/20 1620 11/18/20 0436  NA  --    < > 128* 128* 128*  128* 129* TEST WILL BE CREDITED 131* 132*  K  --    < > 5.4* 6.2* 6.0*  6.0* 5.7* TEST WILL BE CREDITED 4.9 5.0  CL  --    < > 92* 93* 94*  94* 94* TEST WILL BE CREDITED 97* 96*  CO2  --    < > 21* 19* 19*  18* 21* TEST WILL BE CREDITED 23 23  GLUCOSE  --    < > 91 89 81  82 107* TEST WILL BE CREDITED 94 99  BUN  --    < > 32* 39* 41*  40* 42* TEST WILL BE CREDITED 27* 32*  CREATININE  --    < > 4.04* 4.56* 4.76*  4.77* 4.85* TEST WILL BE CREDITED 3.56* 3.87*  CALCIUM  --    < > 8.0* 7.8* 7.7*  7.7* 7.7* TEST WILL BE CREDITED 7.6* 7.5*  MG 1.8  --   --   --  2.4  --   --  2.1 2.2  PHOS 6.3*   < > 8.4* 9.3* 9.5* 9.9*  --   --  7.5*   < > = values in this interval not displayed.   GFR: Estimated Creatinine Clearance: 9 mL/min (A) (by C-G formula based on SCr of 3.87 mg/dL (H)). Recent Labs  Lab 11/15/20 0456 12/09/2020 0519 11/27/2020 0818 11/19/2020 1107 11/18/2020 1457 12/08/2020 2003 11/17/20 0018 11/17/20 0525 11/18/20 0436  PROCALCITON  --   --   --   --  15.40  --   --  16.79  --   WBC 5.9 5.8  --   --   --   --   --  4.2 7.6  LATICACIDVEN  --   --  4.2* 2.8*  --  3.5* 2.3*  --   --     Liver Function Tests: Recent Labs  Lab 10/23/2020 0615 11/15/20 0456 12/08/2020 0519 12/08/2020 2003 11/17/20 0018 11/17/20 0525 11/18/20 0436  AST 17  --   --   --   --   --   --   ALT 10  --   --   --   --   --   --   ALKPHOS 47  --   --   --   --   --   --   BILITOT 1.3*  --   --   --   --   --   --   PROT 7.1  --   --   --   --   --   --   ALBUMIN 3.1*   < > 2.2* 2.1* 2.1* 2.1* 1.9*   < > = values in this interval not displayed.   Recent Labs  Lab 11/11/2020 0615  LIPASE 27   Recent Labs  Lab 11/17/20 1142  AMMONIA 13    ABG    Component Value Date/Time   HCO3 24.9 11/17/2020 1114   ACIDBASEDEF 0.7 11/17/2020 1114   O2SAT 66.2 11/17/2020 1114     Coagulation Profile: Recent Labs   Lab 10/18/2020 0946 11/15/20 0932  INR 1.2 1.3*    Cardiac Enzymes: Recent Labs  Lab 11/14/20 1730 11/17/20 1142 11/17/20 1620  CKTOTAL 5,364* 13,125* 10,035*    HbA1C: No results found for: HGBA1C  CBG: Recent Labs  Lab 11/17/20 1130 11/17/20 1559 11/17/20 1932 11/17/20 2323 11/18/20 0348  GLUCAP 107* 83 74 95 114*    Review of Systems: Positives in BOLD   Unable to assess due to AMS  Past Medical History:  She,  has a past medical history of Allergy, Arthritis, Chronic kidney disease, GERD (gastroesophageal reflux disease), Hypertension, Lupus ([place]) (2002), Macular degeneration of left eye, Myocardial infarction ([place]) (2007), and Osteoporosis.   Surgical History:   Past Surgical History:  Procedure Laterality Date   A/V FISTULAGRAM Right 01/16/2018   Procedure: A/V FISTULAGRAM;  Surgeon: [name], MD;  Location: [place];  Service: Cardiovascular;  Laterality: Right;   ABDOMINAL HYSTERECTOMY     AV FISTULA PLACEMENT Right 12/17/2017   Procedure: ARTERIOVENOUS (AV) FISTULA CREATION ( BRACHIO CEPHALIC POSS. BRACHIO BASILIC );  Surgeon: Algernon Huxley, MD;  Location: [facility];  Service: Vascular;  Laterality: Right;   CARDIOVERSION N/A 10/04/2020   Procedure: CARDIOVERSION;  Surgeon: Kate Sable, MD;  Location: [facility];  Service: Cardiovascular;  Laterality: N/A;   CATARACT EXTRACTION W/PHACO Right 01/03/2016   Procedure: CATARACT EXTRACTION PHACO AND INTRAOCULAR LENS PLACEMENT ([place]);  Surgeon: Leandrew Koyanagi, MD;  Location: [place];  Service: Ophthalmology;  Laterality: Right;   COLON SURGERY     intestine became twisted after hernia sugery   ENDOVASCULAR REPAIR/STENT GRAFT N/A 11/12/2020   Procedure: ENDOVASCULAR REPAIR/STENT GRAFT;  Surgeon: Katha Cabal, MD;  Location: [place] [facility];  Service: Cardiovascular;  Laterality: N/A;   EYE SURGERY     cataract   HERNIA REPAIR     TEMPORAL ARTERY BIOPSY / LIGATION      TEMPORARY DIALYSIS CATHETER N/A 12/12/2020   Procedure: TEMPORARY DIALYSIS CATHETER;  Surgeon: Katha Cabal, MD;  Location: [place] [facility];  Service: Cardiovascular;  Laterality: N/A;   TONSILLECTOMY  Social History:   reports that she quit smoking about 26 years ago. Her smoking use included cigarettes. She started smoking about 36 years ago. She has never used smokeless tobacco. She reports that she does not drink alcohol and does not use drugs.   Family History:  Her family history includes Rheum arthritis in her father; Stomach cancer in her mother; Thyroid disease in her daughter; Varicose Veins in her maternal aunt. There is no history of Breast cancer.   Allergies Allergies  Allergen Reactions   Codeine Itching   Ibuprofen     Avoids "because of lupus"   Morphine And Related Other (See Comments)    Confusion/Agitation   Sulfa Antibiotics Diarrhea   Vioxx [Rofecoxib]     Avoids "because of lupus"   Celebrex [Celecoxib] Rash    Avoids "because of lupus"   Penicillins Rash    Reaction: over 30 years ago     Home Medications  Prior to Admission medications   Medication Sig Start Date End Date Taking? Authorizing Provider  apixaban (ELIQUIS) 2.5 MG TABS tablet Take 1 tablet (2.5 mg total) by mouth 2 (two) times daily. 09/06/20   Cannady, Henrine Screws T, NP  carvedilol (COREG) 25 MG tablet Take 1 tablet (25 mg total) by mouth 2 (two) times daily with a meal. 11/12/19   Cannady, Jolene T, NP  cholecalciferol (VITAMIN D) 1000 units tablet Take 1,000 Units by mouth daily.     [provider]  ferrous sulfate (SLOW FE) 160 (50 Fe) MG TBCR SR tablet Take 160 mg by mouth daily.    [provider]  folic acid (FOLVITE) 1 MG tablet Take 1 tablet (1 mg total) by mouth daily. 12/09/19   Johnson, Megan P, DO  lidocaine (LIDODERM) 5 % Place 1 patch onto the skin daily. Remove & Discard patch within 12 hours or as directed by MD Patient not taking: Reported on  11/15/2020 11/08/20   Marnee Guarneri T, NP  mirtazapine (REMERON) 7.5 MG tablet Take 7.5 mg by mouth at bedtime. 08/31/20   [provider]  rOPINIRole (REQUIP) 0.5 MG tablet Take one tablet (0.5 MG) by mouth in the morning and then take two tablets (1 MG) by mouth at night before bedtime. Patient taking differently: Take 0.5-1 mg by mouth See admin instructions. Take one tablet (0.5 MG) by mouth in the morning and then take two tablets (1 MG) by mouth at night before bedtime. 01/11/20   Cannady, Henrine Screws T, NP  tiZANidine (ZANAFLEX) 2 MG tablet Take 1 tablet (2 mg total) by mouth daily as needed for muscle spasms. 10/21/2020   Venita Lick, NP     Critical care time: 40 minutes     Rosilyn Mings, AGNP  Pulmonary/Critical Care Pager [phone] (please enter 7 digits) PCCM Consult Pager [phone] (please enter 7 digits)

## 2020-11-18 NOTE — Plan of Care (Signed)
  Problem: Education: Goal: Knowledge of General Education information will improve Description: Including pain rating scale, medication(s)/side effects and non-pharmacologic comfort measures Outcome: Not Progressing   Problem: Health Behavior/Discharge Planning: Goal: Ability to manage health-related needs will improve Outcome: Not Progressing   Problem: Clinical Measurements: Goal: Ability to maintain clinical measurements within normal limits will improve Outcome: Not Progressing Goal: Will remain free from infection Outcome: Not Progressing Goal: Diagnostic test results will improve Outcome: Not Progressing Goal: Respiratory complications will improve Outcome: Not Progressing Goal: Cardiovascular complication will be avoided Outcome: Not Progressing   Problem: Activity: Goal: Risk for activity intolerance will decrease Outcome: Not Progressing   Problem: Nutrition: Goal: Adequate nutrition will be maintained Outcome: Not Progressing   Problem: Coping: Goal: Level of anxiety will decrease Outcome: Not Progressing   Problem: Elimination: Goal: Will not experience complications related to bowel motility Outcome: Not Progressing Goal: Will not experience complications related to urinary retention Outcome: Not Progressing   Problem: Pain Managment: Goal: General experience of comfort will improve Outcome: Not Progressing   Problem: Safety: Goal: Ability to remain free from injury will improve Outcome: Not Progressing   Problem: Skin Integrity: Goal: Risk for impaired skin integrity will decrease Outcome: Not Progressing  Patient unable to express needs patient total care doesn't follow commands family at bedside plan of care discussed

## 2020-11-18 NOTE — Progress Notes (Signed)
[facility], [place] 11/18/20  Subjective:   LOS: 5  Patient presented for Lower abdominal pain, Reported diarrhea and vomiting to ER staff HD on Mon-Fri Dx with AAA with intramural thrombus, Also suspicion for aneurysmal leak Underwent endovascular repair / stent graft placement  Patient is awake this AM.  Seldom speaks.  Underwent dialysis yesterday.  Family at bedside at the moment.     Objective:  Vital signs in last 24 hours:  Temp:  [97.6 F (36.4 C)-98.9 F (37.2 C)] 97.7 F (36.5 C) (09/03 0740) Pulse Rate:  [34-142] 59 (09/03 0600) Resp:  [12-32] 18 (09/03 0600) BP: (72-144)/(38-108) 105/54 (09/03 0600) SpO2:  [66 %-100 %] 90 % (09/03 0740)  Weight change:  Filed Weights   10/21/2020 1500  Weight: 58.2 kg    Intake/Output:    Intake/Output Summary (Last 24 hours) at 11/18/2020 1024 Last data filed at 11/18/2020 0700 Gross per 24 hour  Intake 682 ml  Output 500 ml  Net 182 ml     Physical Exam: General:  No acute distress, laying in the bed  HEENT  anicteric, moist oral mucous membrane  Pulm/lungs  normal breathing effort, [place] O2  CVS/Heart  regular rhythm, no rub or gallop  Abdomen:   Soft, nontender  Extremities:  Trace peripheral edema  Neurologic: Awake, only intermittently conversant  Skin:  No acute rashes  Rt arm AVF    Basic Metabolic Panel:  Recent Labs  Lab 11/11/2020 1537 11/14/20 0611 11/19/2020 0519 12/09/2020 2003 11/17/20 0018 11/17/20 0525 11/17/20 1142 11/17/20 1620 11/18/20 0436  NA  --    < > 128* 128* 128*  128* 129* TEST WILL BE CREDITED 131* 132*  K  --    < > 5.4* 6.2* 6.0*  6.0* 5.7* TEST WILL BE CREDITED 4.9 5.0  CL  --    < > 92* 93* 94*  94* 94* TEST WILL BE CREDITED 97* 96*  CO2  --    < > 21* 19* 19*  18* 21* TEST WILL BE CREDITED 23 23  GLUCOSE  --    < > 91 89 81  82 107* TEST WILL BE CREDITED 94 99  BUN  --    < > 32* 39* 41*  40* 42* TEST WILL BE CREDITED 27* 32*  CREATININE  --     < > 4.04* 4.56* 4.76*  4.77* 4.85* TEST WILL BE CREDITED 3.56* 3.87*  CALCIUM  --    < > 8.0* 7.8* 7.7*  7.7* 7.7* TEST WILL BE CREDITED 7.6* 7.5*  MG 1.8  --   --   --  2.4  --   --  2.1 2.2  PHOS 6.3*   < > 8.4* 9.3* 9.5* 9.9*  --   --  7.5*   < > = values in this interval not displayed.      CBC: Recent Labs  Lab 11/05/2020 1537 11/14/20 0611 11/15/20 0456 11/25/2020 0519 11/17/20 0525 11/18/20 0436  WBC 7.5 6.7 5.9 5.8 4.2 7.6  NEUTROABS 6.0  --   --   --   --   --   HGB 11.3* 10.3* 9.4* 10.8* 10.2* 9.7*  HCT 35.2* 31.1* 28.9* 32.4* 29.2* 28.2*  MCV 89.3 91.7 88.4 89.3 85.6 83.9  PLT 152 110* 82* 101* 115* 98*       Lab Results  Component Value Date   HEPBSAG NON REACTIVE 11/14/2020      Microbiology:  Recent Results (from the past  240 hour(s))  SARS CORONAVIRUS 2 (TAT 6-24 HRS) Nasopharyngeal Nasopharyngeal Swab     Status: None   Collection Time: 10/26/2020  9:46 AM   Specimen: Nasopharyngeal Swab  Result Value Ref Range Status   SARS Coronavirus 2 NEGATIVE NEGATIVE Final    Comment: (NOTE) SARS-CoV-2 target nucleic acids are NOT DETECTED.  The SARS-CoV-2 RNA is generally detectable in upper and lower respiratory specimens during the acute phase of infection. Negative results do not preclude SARS-CoV-2 infection, do not rule out co-infections with other pathogens, and should not be used as the sole basis for treatment or other patient management decisions. Negative results must be combined with clinical observations, patient history, and epidemiological information. The expected result is Negative.  Fact Sheet for Patients: SugarRoll.be  Fact Sheet for Healthcare Providers: https://www.woods-mathews.com/  This test is not yet approved or cleared by the [place] FDA and  has been authorized for detection and/or diagnosis of SARS-CoV-2 by FDA under an Emergency Use Authorization (EUA). This EUA will remain   in effect (meaning this test can be used) for the duration of the COVID-19 declaration under Se ction 564(b)(1) of the Act, 21 U.S.C. section 360bbb-3(b)(1), unless the authorization is terminated or revoked sooner.  Performed at [facility], [place] [address] [place], [place] [address]   MRSA Next Gen by PCR, Nasal     Status: None   Collection Time: 11/03/2020  3:09 PM   Specimen: Nasal Mucosa; Nasal Swab  Result Value Ref Range Status   MRSA by PCR Next Gen NOT DETECTED NOT DETECTED Final    Comment: (NOTE) The GeneXpert MRSA Assay (FDA approved for NASAL specimens only), is one component of a comprehensive MRSA colonization surveillance program. It is not intended to diagnose MRSA infection nor to guide or monitor treatment for MRSA infections. Test performance is not FDA approved in patients less than 56 years old. Performed at [facility], [address], [place], [place] [address]   Urine Culture     Status: Abnormal   Collection Time: 11/15/20  8:15 PM   Specimen: Urine, Random  Result Value Ref Range Status   Specimen Description   Final    URINE, RANDOM Performed at [facility], [address], [place], [place] [address]    Special Requests   Final    NONE Performed at [facility], [address] [place], [place] [address]    Culture MULTIPLE SPECIES PRESENT, SUGGEST RECOLLECTION (A)  Final   Report Status 11/17/2020 FINAL  Final  CULTURE, BLOOD (ROUTINE X 2) w Reflex to ID Panel     Status: None (Preliminary result)   Collection Time: 11/30/2020  2:56 PM   Specimen: BLOOD  Result Value Ref Range Status   Specimen Description BLOOD [facility]  Final   Special Requests   Final    BOTTLES DRAWN AEROBIC AND ANAEROBIC Blood Culture results may not be optimal due to an inadequate volume of blood received in culture bottles   Culture   Final    NO GROWTH 2 DAYS Performed at [facility], [address] [place],   [address]    Report Status PENDING  Incomplete  CULTURE, BLOOD (ROUTINE X 2) w Reflex to ID Panel     Status: None (Preliminary result)   Collection Time: 11/24/2020  3:57 PM   Specimen: BLOOD  Result Value Ref Range Status   Specimen Description BLOOD [facility]  Final   Special Requests   Final  BOTTLES DRAWN AEROBIC AND ANAEROBIC Blood Culture results may not be optimal due to an inadequate volume of blood received in culture bottles   Culture   Final    NO GROWTH 2 DAYS Performed at [facility], [address] [place], [place] [address]    Report Status PENDING  Incomplete    Coagulation Studies: No results for input(s): LABPROT, INR in the last 72 hours.   Urinalysis: No results for input(s): COLORURINE, LABSPEC, PHURINE, GLUCOSEU, HGBUR, BILIRUBINUR, KETONESUR, PROTEINUR, UROBILINOGEN, NITRITE, LEUKOCYTESUR in the last 72 hours.  Invalid input(s): APPERANCEUR     Imaging: CT ANGIO HEAD NECK W WO CM  Result Date: 11/17/2020 CLINICAL DATA:  Stroke, follow up Stroke/TIA, assess intracranial arteries EXAM: CT ANGIOGRAPHY HEAD AND NECK TECHNIQUE: Multidetector CT imaging of the head and neck was performed using the standard protocol during bolus administration of intravenous contrast. Multiplanar CT image reconstructions and MIPs were obtained to evaluate the vascular anatomy. Carotid stenosis measurements (when applicable) are obtained utilizing NASCET criteria, using the distal internal carotid diameter as the denominator. CONTRAST:  39m OMNIPAQUE IOHEXOL 350 MG/ML SOLN COMPARISON:  CT head 11/15/2020.  MRI head 11/23/2020. FINDINGS: CT HEAD FINDINGS Brain: Known small right cerebellar and temporal lobe infarcts not well characterized by CT. No evidence of interval acute large vascular territory infarct. No acute hemorrhage. Intracranial and vascular enhancement is noted, presumably from recent contrasted studies. Similar mild for age patchy white matter hypoattenuation,  nonspecific but compatible with chronic microvascular ischemic disease. Similar mild for age atrophy with ex vacuo ventricular dilation. No evidence of mass lesion or abnormal mass effect. No extra-axial fluid collections identified. Vascular: See below. Skull: No acute fracture. Scattered lucent lesions in the calvarium, probably prominent vessels. Sinuses: Near complete opacification of the right sphenoid sinus with bony wall thickening, suggestive of chronic sinusitis. Orbits: No acute finding. Review of the MIP images confirms the above findings CTA NECK FINDINGS Aortic arch: Ulcerated atherosclerosis of the aortic arch. Great vessel origins are patent. Right carotid system: Motion limited evaluation. Calcified noncalcified atherosclerosis at the carotid bifurcation without evidence of greater than 50% stenosis of the common carotid artery or ICA. Left carotid system: Motion limited evaluation. Calcific and noncalcific atherosclerosis at the carotid bifurcation and involving the proximal ICA without evidence of significant (greater than 50%) stenosis. Vertebral arteries: Significantly motion limited evaluation of the proximal vertebral arteries. Apparent narrowing of the proximal vertebral arteries could represent motion artifact or stenosis. More distal vertebral arteries are patent without greater than 50% stenosis. Codominant. Skeleton: Osteopenia. Remote anterior vertebral height loss at T1 and T2, similar to CT cervical spine from January 11, 2020. height loss of additional more inferior thoracic vertebral bodies, likely also chronic given similar findings on prior chest radiograph from January 13, 2018. Other neck: Prominent/dilated right internal jugular vein. No evidence of acute abnormality. Upper chest: Motion limited evaluation. Small layering bilateral pleural effusions. Partially imaged overlying right lung opacities, further characterized on recent chest radiograph. Review of the MIP images  confirms the above findings CTA HEAD FINDINGS Anterior circulation: Moderate to severe left paraclinoid ICA stenosis. Atherosclerosis of bilateral intracranial ICAs with moderate to severe left paraclinoid ICA stenosis. Mild right paraclinoid ICA stenosis. Severe left M1 MCA stenosis. Proximal left M2 MCA branches are patent. Right M1 MCA is patent. Moderate proximal right M2 MCA branch stenosis. Bilateral ACAs are patent. Severe right and moderate left A3 ACA stenosis. Posterior circulation: Bilateral intradural vertebral arteries, basilar artery, and posterior cerebral arteries  are patent. 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 Small right P1 PCA with right posterior communicating artery, anatomic variant. Smaller left posterior communicating artery with more prominent left P1 PCA. Right proximal P2 PCAs patent. There is abrupt non opacification of a right P2 PCA branch immediately distal to a bifurcation (see series 10, images 249/250; series 11, images 132/133; series 12, image 81), compatible with occlusion. More medial right PCA branch remains patent distally. Left PCA is patent. Additional multifocal mild stenosis of the PCAs bilaterally. Venous sinuses: As permitted by contrast timing, patent. Anatomic variants: As detailed above. Review of the MIP images confirms the above findings IMPRESSION: CTA Head: 1. Occlusion of a right P2 PCA branch immediately distal to a bifurcation. 2. Severe left M1 MCA stenosis. 3. Moderate to severe left paraclinoid ICA stenosis. 4. Severe right and moderate left A3 ACA stenosis. 5. Moderate proximal right M2 MCA branch stenosis. 6. 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 CTA Neck: 1. Motion limited evaluation. Apparent moderate narrowing of the proximal vertebral arteries could represent motion artifact and/or stenosis and is not well characterized. Otherwise, no evidence of significant  (greater than 50%) stenosis in the neck. 2. Small layering bilateral pleural effusions. Partially imaged overlying right lung opacities, further characterized on recent chest radiograph. 3. Height loss of multiple thoracic vertebral bodies, partially imaged and likely chronic given similar findings on prior imaging. If there is concern for acute/recent fracture then MRI could further evaluate. CT Head: 1. 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 2. Otherwise, no evidence of interval acute abnormality. 3. Mild for age chronic microvascular ischemic disease and atrophy. 4. Near complete opacification right sphenoid sinus with findings suggestive of chronic sinusitis. Electronically Signed   By: Margaretha Sheffield M.D.   On: 11/17/2020 09:02   DG Chest 1 View  Result Date: 11/17/2020 CLINICAL DATA:  Shortness of breath EXAM: CHEST  1 VIEW COMPARISON:  01/26/2018 FINDINGS: Multifocal patchy right lung opacities with volume loss. Small right pleural effusion. Left lung is clear. Cardiomegaly. IMPRESSION: Multifocal patchy right lung opacities, suspicious for pneumonia, although superimposed volume loss raises the possibility of atelectasis. Small right pleural effusion. Electronically Signed   By: Julian Hy M.D.   On: 11/17/2020 01:24   CT LUMBAR SPINE WO CONTRAST  Result Date: 11/17/2020 CLINICAL DATA:  Rule out infarct.  Status post AAA repair. EXAM: CT LUMBAR SPINE WITHOUT CONTRAST TECHNIQUE: Multidetector CT imaging of the lumbar spine was performed without intravenous contrast administration. Multiplanar CT image reconstructions were also generated. COMPARISON:  MRI of the lumbar spine 11/29/2020. MRI of the thoracic spine 12/07/2020. FINDINGS: Segmentation: 5 lumbar vertebrae. The caudal most well-formed intervertebral disc space is designated L5-S1. Alignment: Thoracolumbar levocurvature. Trace T11-T12 grade 1 retrolisthesis. Trace L1-L2 grade 1  retrolisthesis. Vertebrae: Redemonstrated chronic vertebral compression fractures at T11, L2 and L3. No significant bony retropulsion at these levels. Paraspinal and other soft tissues: Aortoiliac stent graft for abdominal aortic aneurysm. A catheter extends from an inferior approach into the IVC. Embolization material with prominent streak and beam hardening artifact obscuring significant portions of the lower abdomen/pelvis. Persistent enhancement of the right kidney from CT angiogram head/neck performed earlier today. Sigmoid diverticulosis. Small volume abdominopelvic free fluid. Extensive airspace consolidation within the imaged right lung base. Mild dependent atelectasis at the left lung base with likely small pleural effusion. Disc levels: Lumbar spondylosis, as described on  the recent prior lumbar spine MRI of 11/27/2020. IMPRESSION: Redemonstrated chronic vertebral compression fractures at T11, L2 and L3. Lumbar spondylosis, as described on the recent prior lumbar spine MRI of 11/28/2020. Please refer to this prior report for further description. Thoracolumbar levocurvature. Trace T11-T12 and L1-L2 grade 1 retrolisthesis. Aortoiliac stent graft for abdominal aortic aneurysm repair. There is persistent enhancement of the right kidney from the CT angiogram head/neck performed earlier today. This may be secondary to chronic renal disease. However, renal cortical necrosis can not be excluded given the patient's history. Extensive airspace consolidation within the imaged right lung base. Correlate for pneumonia. Mild dependent atelectasis and likely small pleural effusion within the imaged left lung base. Nonspecific small volume abdominopelvic free fluid. Sigmoid diverticulosis. Electronically Signed   By: Kellie Simmering D.O.   On: 11/17/2020 19:24   CT PELVIS WO CONTRAST  Result Date: 11/17/2020 CLINICAL DATA:  Lower extremity ischemia following endovascular abdominal aortic aneurysm repair. EXAM: CT PELVIS  WITHOUT CONTRAST TECHNIQUE: Multidetector CT imaging of the pelvis was performed following the standard protocol without intravenous contrast. COMPARISON:  11/15/2020, 10/16/2020 FINDINGS: Urinary Tract: Retained contrast is seen within the visualized right renal cortex. No hydronephrosis. The distal ureters are decompressed. The bladder is decompressed. Bowel: Extensive sigmoid diverticulosis again noted. Presacral edema appears stable. Moderate diverticulosis of the terminal ileum again noted. Trace high attenuation free fluid has developed within the pelvis, likely representing a trace amount of hemoperitoneum. Vascular/Lymphatic: Bifurcated endovascular stent graft is again seen within the visualized abdominal aorta and common iliac arteries bilaterally. Embolization of the right internal iliac artery has been performed with multiple embolization coils again identified resulting in significant streak artifact within the pelvis. Abdominal aortic aneurysm sac demonstrates relative stability measuring 6.9 x 6.1 cm in greatest dimension. Punctate foci of gas, possibly post procedural, again noted within the aneurysm sac. Moderate atherosclerotic calcification noted within the external iliac arteries bilaterally. Patency of the arterial vasculature is not well assessed on this non contrast study. Right common femoral central venous catheter extends into the inferior vena cava beyond the superior margin of the examination. Reproductive:  Uterus absent.  No adnexal masses. Other: Cholelithiasis noted. There is increasing subcutaneous edema within the flanks bilaterally. This is particularly prominent within the left gluteal region and this may represent more focal edema or hemorrhage in the setting of trauma. No discrete drainable fluid collection identified. Musculoskeletal: No acute bone abnormality. No lytic or blastic bone lesions. Degenerative changes are seen within the visualized lumbar spine. IMPRESSION: Stable  appearance of the visualized bifurcated aortoiliac stent graft. Stable aneurysm sac with punctate foci of gas possibly postprocedural in nature. Patency of the vasculature is not well assessed on this noncontrast examination. Interval development of trace hemoperitoneum. Progressive body wall edema, possibly related to progressive anasarca. More focal infiltration within the left gluteal region may represent superimposed edema or interstitial hemorrhage in the setting of trauma and clinical correlation may be helpful. No acute bone abnormality. Cholelithiasis. Diverticulosis. Aortic Atherosclerosis (ICD10-I70.0). Aortic aneurysm NOS (ICD10-I71.9). Electronically Signed   By: Fidela Salisbury M.D.   On: 11/17/2020 19:32   MR BRAIN WO CONTRAST  Result Date: 11/29/2020 CLINICAL DATA:  Mental status change, unknown cause. EXAM: MRI HEAD WITHOUT CONTRAST TECHNIQUE: Multiplanar, multiecho pulse sequences of the brain and surrounding structures were obtained without intravenous contrast. COMPARISON:  Head CT 11/15/2020 FINDINGS: The study is mildly motion degraded. Brain: There are subcentimeter acute infarcts in the right greater than left cerebellar hemispheres and left  temporal lobe. There are chronic microhemorrhages in the right cerebellar hemisphere which are separate from the acute infarcts. A small focus of chronic hemorrhage is also noted in the ventral medulla. T2 hyperintensities in the cerebral white matter bilaterally are nonspecific but compatible with mild chronic small vessel ischemic disease. There is mild generalized cerebral atrophy. No mass, midline shift, or extra-axial fluid collection is identified. Vascular: Abnormal appearance of the proximal basilar artery. Skull and upper cervical spine: Unremarkable bone marrow signal. Sinuses/Orbits: Chronic right sphenoid sinusitis. Bilateral cataract extraction. No significant mastoid fluid. Other: None. IMPRESSION: 1. Small acute infarcts in the cerebellum  and left temporal lobe. 2. Abnormal appearance of the proximal basilar artery which may reflect reduced flow from a stenosis or segmental occlusion occlusion. Head and neck CTA is recommended for further evaluation. 3. Mild chronic small vessel ischemic disease and cerebral atrophy. These results will be called to the ordering clinician or representative by the Radiologist Assistant, and communication documented in the PACS or [employer]. Electronically Signed   By: Logan Bores M.D.   On: 11/18/2020 19:15   MR THORACIC SPINE WO CONTRAST  Result Date: 12/13/2020 CLINICAL DATA:  Abnormal posture. EXAM: MRI THORACIC SPINE WITHOUT CONTRAST TECHNIQUE: Multiplanar, multisequence MR imaging of the thoracic spine was performed. No intravenous contrast was administered. COMPARISON:  None. FINDINGS: The examination had to be discontinued prior to completion due to the patient's altered mental status. Sagittal T1, T2, and STIR sequences were obtained and are mildly to moderately motion degraded. No axial imaging was obtained. Alignment:  Mild scoliosis.  No significant listhesis. Vertebrae: Nonspecific diffuse bone marrow heterogeneity. No grossly destructive bone lesion. Mild T2 and moderate T5 and T11 compression fractures which are chronic. No acute fracture. Cord: Limited assessment due to motion and absence of axial imaging. No definite cord signal abnormality identified. Paraspinal and other soft tissues: Suggestion of right-sided paravertebral soft tissue edema or inflammation in the upper thoracic spine on the sagittal STIR sequence with assessment limited by incomplete coverage and lack of axial imaging (series 34, image 1). This may also involve the posterior right lung. Disc levels: Posterior disc osteophyte complex at T5-6 results in a mild impression on the ventral spinal cord. Mild disc bulging is present at multiple other levels in the thoracic spine. No significant spinal stenosis is evident.  IMPRESSION: 1. Incomplete, motion degraded examination. 2. Grossly normal thoracic spinal cord signal. 3. Mild thoracic spondylosis without significant stenosis. 4. Possible right-sided upper thoracic paravertebral soft tissue edema or lung inflammation. Consider CT for further evaluation. Electronically Signed   By: Logan Bores M.D.   On: 11/17/2020 19:29   MR LUMBAR SPINE WO CONTRAST  Result Date: 12/13/2020 CLINICAL DATA:  Abnormal posture. EXAM: MRI LUMBAR SPINE WITHOUT CONTRAST TECHNIQUE: Multiplanar, multisequence MR imaging of the lumbar spine was performed. No intravenous contrast was administered. COMPARISON:  CTA abdomen and pelvis 11/15/2020 FINDINGS: The study is severely motion degraded. Segmentation: Standard. Alignment:  Normal. Vertebrae: Nonspecific diffuse bone marrow heterogeneity. No grossly destructive bone lesion. Mild L2 and L3 compression fractures which are chronic. No acute fracture. Conus medullaris and cauda equina: Conus extends to the L1-2 level. Conus and cauda equina appear normal. Paraspinal and other soft tissues: Renal atrophy. Aortoiliac stent graft placement for treatment of an infrarenal abdominal aortic aneurysm, more fully evaluated on the recent CTA. Disc levels: Moderate to severe disc space narrowing at L5-S1 with preserved disc space heights elsewhere in the lumbar spine. Mostly mild disc bulging and  facet hypertrophy throughout the lumbar spine without significant spinal stenosis. Mild bilateral neural foraminal stenosis at L3-4 and L4-5 and mild right and moderate left neural foraminal stenosis at L5-S1. IMPRESSION: 1. Severely motion degraded examination. 2. Chronic L2 and L3 compression fractures. 3. Lumbar disc degeneration greatest at L5-S1 where there is mild-to-moderate neural foraminal stenosis. 4. No spinal stenosis. Electronically Signed   By: Logan Bores M.D.   On: 11/17/2020 20:20   PERIPHERAL VASCULAR CATHETERIZATION  Result Date: 11/25/2020 See  surgical note for result.  EEG adult  Result Date: 11/17/2020 Greta Doom, MD     11/17/2020  6:02 PM History: 86 yo F with AMS Sedation: precedex Technique: This EEG was acquired with electrodes placed according to the International 10-20 electrode system (including Fp1, Fp2, F3, F4, C3, C4, P3, P4, O1, O2, T3, T4, T5, T6, A1, A2, Fz, Cz, Pz). The following electrodes were missing or displaced: none. Background: The background consists of generalized irregular predominantly delta range slow activity. There eis no clear PDR seen. There are occasional runs consistent with sleep structures which are symmetric. Photic stimulation: Physiologic driving is not performed EEG Abnormalities: 1) Generalized irregular slow activity 2) Absent PDR Clinical Interpretation: This EEG is consistent with a generalized non-specific cerebral dysfunction(encephalopathy). There was no seizure or seizure predisposition recorded on this study. Please note that lack of epileptiform activity on EEG does not preclude the possibility of epilepsy. Roland Rack, MD [facility] [phone] If 7pm- 7am, please page neurology on call as listed in [place].     Medications:    sodium chloride Stopped (11/15/20 1129)   sodium chloride Stopped (11/27/2020 1620)   cefTRIAXone (ROCEPHIN)  IV Stopped (11/17/20 2325)   dexmedetomidine (PRECEDEX) IV infusion 0.6 mcg/kg/hr (11/18/20 0700)   magnesium sulfate bolus IVPB     norepinephrine (LEVOPHED) Adult infusion 4 mcg/min (11/18/20 0739)    Chlorhexidine Gluconate Cloth  6 each Topical Q0600   docusate sodium  100 mg Oral Daily   epoetin (EPOGEN/PROCRIT) injection  4,000 Units Intravenous Q M,W,F-HD   famotidine  20 mg Intravenous Daily   rOPINIRole  0.5 mg Oral q AM   rOPINIRole  1 mg Oral QHS   thiamine injection  100 mg Intravenous Daily   acetaminophen **OR** acetaminophen, alum & mag hydroxide-simeth, guaiFENesin-dextromethorphan, hydrALAZINE, labetalol,  magnesium sulfate bolus IVPB, metoprolol tartrate, ondansetron (ZOFRAN) IV, phenol, potassium chloride  Assessment/ Plan:  86 y.o. female with ESRD, h/o lupus nephritis, A flutter, GERD, was admitted on 11/05/2020 for  Active Problems:   Restless leg syndrome   Anemia in CKD (chronic kidney disease)   Aspiration pneumonia (HCC)   ESRD (end stage renal disease) (HCC)   Ruptured abdominal aortic aneurysm (AAA) (HCC)   AAA (abdominal aortic aneurysm) (HCC)   Aneurysm, abdominal aortic, with rupture (HCC)   Acute encephalopathy   Cerebellar stroke (HCC)   Thrombocytopenia (HCC)   UTI (urinary tract infection)  Abdominal aortic aneurysm (AAA) without rupture (HCC) [I71.4] Ruptured abdominal aortic aneurysm (AAA) (HCC) [I71.3] AAA (abdominal aortic aneurysm) ([place]) [I71.4] Aneurysm, abdominal aortic, with rupture ([place]) [I71.3]  #. ESRD, MWF Patient underwent hemodialysis treatment yesterday.  Tolerated well.  We plan for dialysis again on Monday.  She was previously treated on dialysis on Mondays and Fridays.  We will switch the patient to MWF given critical illness now.  #. Anemia of CKD  Lab Results  Component Value Date   HGB 9.7 (L) 11/18/2020   Continue Epogen 4000 IV with dialysis on  MWF schedule.  #. Secondary hyperparathyroidism of renal origin N 25.81   No results found for: PTH Lab Results  Component Value Date   PHOS 7.5 (H) 11/18/2020   Phosphorus a bit high yesterday at, we will repeat these values on Monday.   #.  AAA, contained rupture S/p emergent endovascular repair on 10/26/2020 Angiogram could not be completed because of altered mental status  # Acidosis Serum bicarbonate improved to 23, continue to monitor acid base status.     LOS: 5 Brenten Janney 9/3/202210:24 AM  [facility] [place], [place]

## 2020-11-18 NOTE — Progress Notes (Signed)
Subjective: She received versed earlier today and is on a higher dose of Precedex due to just having an attempt to place enteral access.  Per nursing she was essentially the same today as yesterday before that.  Exam: Vitals:   11/18/20 1230 11/18/20 1245  BP: 97/72 (!) 97/59  Pulse: (!) 39   Resp: (!) 24 (!) 24  Temp:    SpO2: (!) 84%    Gen: In bed, NAD Resp: non-labored breathing, no acute distress Abd: soft, nt  Neuro: MS: Does not open eyes or follow commands CN: Pupils equal round and reactive, eyes are midline.  Blinks to eyelid stimulation bilaterally Motor: She withdraws bilateral upper extremities to noxious stimulation, no movement in lower extremities Sensory: As above DTR: Reduced throughout  Pertinent Labs: CK 10,000  EEG is negative  MRI with multiple embolic appearing strokes CTA with multifocal stenosis, P2 occlusion (which is not in the distribution of any of her ischemia on MRI)  Impression: 86 year old female with encephalopathy in the setting of recent ruptured aortic aneurysm.  She has some small embolic appearing strokes on MRI, though I doubt these are contributing much to the encephalopathy.  Her lower extremity weakness could be limb ischemia versus spinal cord ischemia.    The embolic strokes could certainly be due to her atrial fibrillation in the setting of anticoagulant reversal.  Once stable from a surgical standpoint, would consider reintroducing at least low-dose aspirin.   I suspect that the etiology of her encephalopathy is multifactorial given her multiple acute medical issues.  Care for this would be supportive, and there could be slow gradual improvement over time.  Hypoxic injury due to hypotension would be another consideration, but given that she was following commands after her hypotension, I think this is less likely.  Recommendations: 1) would continue thiamine supplementation 2) though I doubt an atherogenic etiology to her current  strokes, if she survives this acute episode, would favor starting high-dose statin given her multifocal cerebral intracranial stenosis. 3) aspirin 81 mg daily once cleared from surgical perspective 4) neurology will be available on an as-needed basis moving forward, please call with further questions or concerns.  Roland Rack, MD [facility] [phone]  If 7pm- 7am, please page neurology on call as listed in [place].

## 2020-11-18 NOTE — Progress Notes (Signed)
Late Entry: 2025  Nasal trumpet placed for clearance of secretions. Pt nts for large copious amts of secretions. Pt seem to tol well. Bbs improved. Will follow.

## 2020-11-18 NOTE — Progress Notes (Signed)
Subjective  - POD #5, s/p repair of ruptured AAA  No acute events overnight   Physical Exam:  Resting comfortably Opens eyes to voice, but not very responsive Palpable femoral pulses, brisk popliteal doppler signals bilaterally, no pedal doppler signals, but feet appear perfused with some discoloration on the plantar surface      Assessment/Plan:  POD #5  Cerebellar stroke:  appreciate neurp input ESRD:  renal following UTI:  on abx Extensive conversation with husband today.  I suspect the patient has had some form of embolization from her ruptured AAA which is causing the discoloration of her feet.  She had a CTA with Bilateral runoff that showed right popliteal occlusion.  Tibial vessels were not well visualized secondary to contrast timing.  I do not think surgical intervention would be beneficial at this point.  I will continue to follow her exam.  May consider repeat CT scan if no improvement Marissa Hess 11/18/2020 3:06 PM --  Vitals:   11/18/20 1230 11/18/20 1245  BP: 97/72 (!) 97/59  Pulse: (!) 39   Resp: (!) 24 (!) 24  Temp:    SpO2: (!) 84%     Intake/Output Summary (Last 24 hours) at 11/18/2020 1506 Last data filed at 11/18/2020 1200 Gross per 24 hour  Intake 712.01 ml  Output --  Net 712.01 ml     Laboratory CBC    Component Value Date/Time   WBC 7.6 11/18/2020 0436   HGB 9.7 (L) 11/18/2020 0436   HGB 10.9 (L) 09/06/2020 1035   HCT 28.2 (L) 11/18/2020 0436   HCT 33.5 (L) 09/06/2020 1035   PLT 98 (L) 11/18/2020 0436   PLT 201 09/06/2020 1035    BMET    Component Value Date/Time   NA 132 (L) 11/18/2020 0436   NA 140 09/06/2020 1035   K 5.0 11/18/2020 0436   CL 96 (L) 11/18/2020 0436   CO2 23 11/18/2020 0436   GLUCOSE 99 11/18/2020 0436   BUN 32 (H) 11/18/2020 0436   BUN 12 09/06/2020 1035   CREATININE 3.87 (H) 11/18/2020 0436   CALCIUM 7.5 (L) 11/18/2020 0436   GFRNONAA 11 (L) 11/18/2020 0436   GFRAA 20 (L) 11/26/2018 1346     COAG Lab Results  Component Value Date   INR 1.3 (H) 11/15/2020   INR 1.2 11/06/2020   INR 0.95 12/11/2017   No results found for: PTT  Antibiotics Anti-infectives (From admission, onward)    Start     Dose/Rate Route Frequency Ordered Stop   11/17/20 0000  clindamycin (CLEOCIN) IVPB 300 mg  Status:  Discontinued       Note to Pharmacy: To be given in specials   300 mg 100 mL/hr over 30 Minutes Intravenous  Once 11/28/2020 1256 11/18/2020 1446   12/03/2020 1307  clindamycin (CLEOCIN) 300 MG/50ML IVPB       Note to Pharmacy: Corlis Hove   : cabinet override      11/22/2020 1307 11/17/20 0114   12/10/2020 0000  ceFAZolin (ANCEF) IVPB 1 g/50 mL premix       Note to Pharmacy: Send with pt to OR   1 g 100 mL/hr over 30 Minutes Intravenous On call 11/15/20 1901 11/17/20 0000   11/15/20 2330  cefTRIAXone (ROCEPHIN) 1 g in sodium chloride 0.9 % 100 mL IVPB        1 g 200 mL/hr over 30 Minutes Intravenous Every 24 hours 11/15/20 2239 11/21/20 2359   11/14/20 0000  clindamycin (CLEOCIN)  IVPB 300 mg        300 mg 100 mL/hr over 30 Minutes Intravenous Every 12 hours 10/16/2020 1446 11/14/20 1415   11/07/2020 1148  clindamycin (CLEOCIN) 300 MG/50ML IVPB       Note to Pharmacy: Genelle Bal   : cabinet override      11/15/2020 1148 11/06/2020 1232   11/09/2020 1145  clindamycin (CLEOCIN) IVPB 300 mg        300 mg 100 mL/hr over 30 Minutes Intravenous On call to O.R. 10/28/2020 1138 11/14/2020 1153        V. Leia Alf, M.D., [facility] [place] Office: [phone] Pager:  [phone]

## 2020-11-19 DIAGNOSIS — I713 Abdominal aortic aneurysm, ruptured: Secondary | ICD-10-CM | POA: Diagnosis not present

## 2020-11-19 DIAGNOSIS — I639 Cerebral infarction, unspecified: Secondary | ICD-10-CM

## 2020-11-19 LAB — GLUCOSE, CAPILLARY
Glucose-Capillary: 107 mg/dL — ABNORMAL HIGH (ref 70–99)
Glucose-Capillary: 109 mg/dL — ABNORMAL HIGH (ref 70–99)
Glucose-Capillary: 111 mg/dL — ABNORMAL HIGH (ref 70–99)
Glucose-Capillary: 65 mg/dL — ABNORMAL LOW (ref 70–99)
Glucose-Capillary: 92 mg/dL (ref 70–99)
Glucose-Capillary: 96 mg/dL (ref 70–99)
Glucose-Capillary: 97 mg/dL (ref 70–99)

## 2020-11-19 LAB — CBC
HCT: 29.9 % — ABNORMAL LOW (ref 36.0–46.0)
Hemoglobin: 10.2 g/dL — ABNORMAL LOW (ref 12.0–15.0)
MCH: 28.7 pg (ref 26.0–34.0)
MCHC: 34.1 g/dL (ref 30.0–36.0)
MCV: 84 fL (ref 80.0–100.0)
Platelets: 93 10*3/uL — ABNORMAL LOW (ref 150–400)
RBC: 3.56 MIL/uL — ABNORMAL LOW (ref 3.87–5.11)
RDW: 20.3 % — ABNORMAL HIGH (ref 11.5–15.5)
WBC: 11.7 10*3/uL — ABNORMAL HIGH (ref 4.0–10.5)
nRBC: 1.5 % — ABNORMAL HIGH (ref 0.0–0.2)

## 2020-11-19 LAB — RENAL FUNCTION PANEL
Albumin: 2.1 g/dL — ABNORMAL LOW (ref 3.5–5.0)
Anion gap: 14 (ref 5–15)
BUN: 42 mg/dL — ABNORMAL HIGH (ref 8–23)
CO2: 22 mmol/L (ref 22–32)
Calcium: 7.9 mg/dL — ABNORMAL LOW (ref 8.9–10.3)
Chloride: 97 mmol/L — ABNORMAL LOW (ref 98–111)
Creatinine, Ser: 4.67 mg/dL — ABNORMAL HIGH (ref 0.44–1.00)
GFR, Estimated: 9 mL/min — ABNORMAL LOW (ref >60)
Glucose, Bld: 110 mg/dL — ABNORMAL HIGH (ref 70–99)
Phosphorus: 7.5 mg/dL — ABNORMAL HIGH (ref 2.5–4.6)
Potassium: 5.4 mmol/L — ABNORMAL HIGH (ref 3.5–5.1)
Sodium: 133 mmol/L — ABNORMAL LOW (ref 135–145)

## 2020-11-19 LAB — MAGNESIUM: Magnesium: 2.4 mg/dL (ref 1.7–2.4)

## 2020-11-19 LAB — CK: Total CK: 6882 U/L — ABNORMAL HIGH (ref 38–234)

## 2020-11-19 MED ORDER — DEXTROSE 10 % IV SOLN: INTRAVENOUS | Status: DC

## 2020-11-19 MED ORDER — M.V.I. ADULT IV INJ
INTRAVENOUS | Status: DC
  Filled 2020-11-19: qty 10

## 2020-11-19 NOTE — Progress Notes (Signed)
Patient CBG down to 61. D50 IV given per hypoglycemia standing order. CBG improved to 94. Continue to assess and monitor.

## 2020-11-19 NOTE — Progress Notes (Signed)
Patient NTS via catheter for a copious amount of thick tan secretions. Pre-post 100% o2 applied via NRB. Pulse ox not trending well so additional o2 applied for procedure. Patient tolerated efforts well. O2 resumed at 4 liters. 94% noted via nasal cannula.

## 2020-11-19 NOTE — Plan of Care (Signed)

## 2020-11-19 NOTE — Progress Notes (Signed)
[employer], [place] 11/19/20  Subjective:   LOS: 6  Patient presented for Lower abdominal pain, Reported diarrhea and vomiting to ER staff HD on Mon-Fri Dx with AAA with intramural thrombus, Also suspicion for aneurysmal leak Underwent endovascular repair / stent graft placement  Patient seen and evaluated at bedside.  Daughter at the bedside this AM.  Currently on Precedex therefore sedated and not speaking.     Objective:  Vital signs in last 24 hours:  Temp:  [98.3 F (36.8 C)-99.9 F (37.7 C)] 99.9 F (37.7 C) (09/04 0345) Pulse Rate:  [39-84] 80 (09/04 0700) Resp:  [15-37] 32 (09/04 0700) BP: (85-127)/(41-84) 103/78 (09/04 0700) SpO2:  [84 %-100 %] 98 % (09/04 0700)  Weight change:  Filed Weights   10/21/2020 1500  Weight: 58.2 kg    Intake/Output:    Intake/Output Summary (Last 24 hours) at 11/19/2020 1215 Last data filed at 11/19/2020 0700 Gross per 24 hour  Intake 653.89 ml  Output --  Net 653.89 ml     Physical Exam: General:  No acute distress, laying in the bed  HEENT  anicteric, moist oral mucous membrane  Pulm/lungs  normal breathing effort, [place] O2  CVS/Heart  regular rhythm, no rub or gallop  Abdomen:   Soft, nontender  Extremities:  Trace peripheral edema  Neurologic:  Sedated  Skin:  No acute rashes  Rt arm AVF    Basic Metabolic Panel:  Recent Labs  Lab 11/03/2020 1537 11/14/20 0611 12/03/2020 2003 11/17/20 0018 11/17/20 0525 11/17/20 1142 11/17/20 1620 11/18/20 0436 11/19/20 0609  NA  --    < > 128* 128*  128* 129* TEST WILL BE CREDITED 131* 132* 133*  K  --    < > 6.2* 6.0*  6.0* 5.7* TEST WILL BE CREDITED 4.9 5.0 5.4*  CL  --    < > 93* 94*  94* 94* TEST WILL BE CREDITED 97* 96* 97*  CO2  --    < > 19* 19*  18* 21* TEST WILL BE CREDITED '23 23 22  '$ GLUCOSE  --    < > 89 81  82 107* TEST WILL BE CREDITED 94 99 110*  BUN  --    < > 39* 41*  40* 42* TEST WILL BE CREDITED 27* 32* 42*  CREATININE  --     < > 4.56* 4.76*  4.77* 4.85* TEST WILL BE CREDITED 3.56* 3.87* 4.67*  CALCIUM  --    < > 7.8* 7.7*  7.7* 7.7* TEST WILL BE CREDITED 7.6* 7.5* 7.9*  MG 1.8  --   --  2.4  --   --  2.1 2.2 2.4  PHOS 6.3*   < > 9.3* 9.5* 9.9*  --   --  7.5* 7.5*   < > = values in this interval not displayed.      CBC: Recent Labs  Lab 11/12/2020 1537 11/14/20 0611 11/15/20 0456 12/05/2020 0519 11/17/20 0525 11/18/20 0436 11/19/20 0609  WBC 7.5   < > 5.9 5.8 4.2 7.6 11.7*  NEUTROABS 6.0  --   --   --   --   --   --   HGB 11.3*   < > 9.4* 10.8* 10.2* 9.7* 10.2*  HCT 35.2*   < > 28.9* 32.4* 29.2* 28.2* 29.9*  MCV 89.3   < > 88.4 89.3 85.6 83.9 84.0  PLT 152   < > 82* 101* 115* 98* 93*   < > = values in  this interval not displayed.       Lab Results  Component Value Date   HEPBSAG NON REACTIVE 11/14/2020      Microbiology:  Recent Results (from the past 240 hour(s))  SARS CORONAVIRUS 2 (TAT 6-24 HRS) Nasopharyngeal Nasopharyngeal Swab     Status: None   Collection Time: 10/30/2020  9:46 AM   Specimen: Nasopharyngeal Swab  Result Value Ref Range Status   SARS Coronavirus 2 NEGATIVE NEGATIVE Final    Comment: (NOTE) SARS-CoV-2 target nucleic acids are NOT DETECTED.  The SARS-CoV-2 RNA is generally detectable in upper and lower respiratory specimens during the acute phase of infection. Negative results do not preclude SARS-CoV-2 infection, do not rule out co-infections with other pathogens, and should not be used as the sole basis for treatment or other patient management decisions. Negative results must be combined with clinical observations, patient history, and epidemiological information. The expected result is Negative.  Fact Sheet for Patients: SugarRoll.be  Fact Sheet for Healthcare Providers: https://www.woods-mathews.com/  This test is not yet approved or cleared by the [place] FDA and  has been authorized for detection and/or  diagnosis of SARS-CoV-2 by FDA under an Emergency Use Authorization (EUA). This EUA will remain  in effect (meaning this test can be used) for the duration of the COVID-19 declaration under Se ction 564(b)(1) of the Act, 21 U.S.C. section 360bbb-3(b)(1), unless the authorization is terminated or revoked sooner.  Performed at [facility], [place] [address], [place], [place] [address]   MRSA Next Gen by PCR, Nasal     Status: None   Collection Time: 10/27/2020  3:09 PM   Specimen: Nasal Mucosa; Nasal Swab  Result Value Ref Range Status   MRSA by PCR Next Gen NOT DETECTED NOT DETECTED Final    Comment: (NOTE) The GeneXpert MRSA Assay (FDA approved for NASAL specimens only), is one component of a comprehensive MRSA colonization surveillance program. It is not intended to diagnose MRSA infection nor to guide or monitor treatment for MRSA infections. Test performance is not FDA approved in patients less than 41 years old. Performed at [facility], [address], [place], [place] [address]   Urine Culture     Status: Abnormal   Collection Time: 11/15/20  8:15 PM   Specimen: Urine, Random  Result Value Ref Range Status   Specimen Description   Final    URINE, RANDOM Performed at [facility], [address], [place], [place] [address]    Special Requests   Final    NONE Performed at [facility], [address] [place], [place] [address]    Culture MULTIPLE SPECIES PRESENT, SUGGEST RECOLLECTION (A)  Final   Report Status 11/17/2020 FINAL  Final  CULTURE, BLOOD (ROUTINE X 2) w Reflex to ID Panel     Status: None (Preliminary result)   Collection Time: 12/01/2020  2:56 PM   Specimen: BLOOD  Result Value Ref Range Status   Specimen Description BLOOD [facility]  Final   Special Requests   Final    BOTTLES DRAWN AEROBIC AND ANAEROBIC Blood Culture results may not be optimal due to an inadequate volume of blood received in culture bottles   Culture   Final     NO GROWTH 2 DAYS Performed at [facility], [address] [place], [place] [address]    Report Status PENDING  Incomplete  CULTURE, BLOOD (ROUTINE X 2) w Reflex to ID Panel     Status: None (Preliminary  result)   Collection Time: 12/01/2020  3:57 PM   Specimen: BLOOD  Result Value Ref Range Status   Specimen Description BLOOD [facility]  Final   Special Requests   Final    BOTTLES DRAWN AEROBIC AND ANAEROBIC Blood Culture results may not be optimal due to an inadequate volume of blood received in culture bottles   Culture   Final    NO GROWTH 2 DAYS Performed at [facility], [address], [place], [place] [address]    Report Status PENDING  Incomplete  Urine Culture     Status: Abnormal (Preliminary result)   Collection Time: 11/17/20  4:21 PM   Specimen: Urine, Random  Result Value Ref Range Status   Specimen Description   Final    URINE, RANDOM Performed at [facility], [address] [place], [place] [address]    Special Requests   Final    NONE Performed at [facility], [address], [place], [place] [address]    Culture (A)  Final    >=100,000 COLONIES/mL GRAM NEGATIVE RODS CULTURE REINCUBATED FOR BETTER GROWTH Performed at [facility], [place] [address] [place], [place] [address]    Report Status PENDING  Incomplete  Culture, Respiratory w Gram Stain     Status: None (Preliminary result)   Collection Time: 11/17/20  6:32 PM   Specimen: Tracheal Aspirate; Respiratory  Result Value Ref Range Status   Specimen Description   Final    TRACHEAL ASPIRATE Performed at [facility], [address] [place], [place] [address]    Special Requests   Final    NONE Performed at [facility], [place], [place] [address]    Gram Stain   Final    FEW WBC PRESENT,BOTH PMN AND MONONUCLEAR RARE SQUAMOUS EPITHELIAL CELLS PRESENT ABUNDANT GRAM NEGATIVE RODS RARE GRAM POSITIVE COCCI Performed at [facility], [place] [address], [place], [place] [address]    Culture PENDING  Incomplete   Report Status PENDING  Incomplete    Coagulation Studies: No results for input(s): LABPROT, INR in the last 72 hours.   Urinalysis: No results for input(s): COLORURINE, LABSPEC, PHURINE, GLUCOSEU, HGBUR, BILIRUBINUR, KETONESUR, PROTEINUR, UROBILINOGEN, NITRITE, LEUKOCYTESUR in the last 72 hours.  Invalid input(s): APPERANCEUR     Imaging: CT LUMBAR SPINE WO CONTRAST  Result Date: 11/17/2020 CLINICAL DATA:  Rule out infarct.  Status post AAA repair. EXAM: CT LUMBAR SPINE WITHOUT CONTRAST TECHNIQUE: Multidetector CT imaging of the lumbar spine was performed without intravenous contrast administration. Multiplanar CT image reconstructions were also generated. COMPARISON:  MRI of the lumbar spine 11/19/2020. MRI of the thoracic spine 11/19/2020. FINDINGS: Segmentation: 5 lumbar vertebrae. The caudal most well-formed intervertebral disc space is designated L5-S1. Alignment: Thoracolumbar levocurvature. Trace T11-T12 grade 1 retrolisthesis. Trace L1-L2 grade 1 retrolisthesis. Vertebrae: Redemonstrated chronic vertebral compression fractures at T11, L2 and L3. No significant bony retropulsion at these levels. Paraspinal and other soft tissues: Aortoiliac stent graft for abdominal aortic aneurysm. A catheter extends from an inferior approach into the IVC. Embolization material with prominent streak and beam hardening artifact obscuring significant portions of the lower abdomen/pelvis. Persistent enhancement of the right kidney from CT angiogram head/neck performed earlier today. Sigmoid diverticulosis. Small volume abdominopelvic free fluid. Extensive airspace consolidation within the imaged right lung base. Mild dependent atelectasis at the left lung base with likely small pleural effusion. Disc levels: Lumbar spondylosis, as described on the recent prior lumbar spine MRI  of 12/11/2020. IMPRESSION: Redemonstrated  chronic vertebral compression fractures at T11, L2 and L3. Lumbar spondylosis, as described on the recent prior lumbar spine MRI of 11/23/2020. Please refer to this prior report for further description. Thoracolumbar levocurvature. Trace T11-T12 and L1-L2 grade 1 retrolisthesis. Aortoiliac stent graft for abdominal aortic aneurysm repair. There is persistent enhancement of the right kidney from the CT angiogram head/neck performed earlier today. This may be secondary to chronic renal disease. However, renal cortical necrosis can not be excluded given the patient's history. Extensive airspace consolidation within the imaged right lung base. Correlate for pneumonia. Mild dependent atelectasis and likely small pleural effusion within the imaged left lung base. Nonspecific small volume abdominopelvic free fluid. Sigmoid diverticulosis. Electronically Signed   By: Kellie Simmering D.O.   On: 11/17/2020 19:24   CT PELVIS WO CONTRAST  Result Date: 11/17/2020 CLINICAL DATA:  Lower extremity ischemia following endovascular abdominal aortic aneurysm repair. EXAM: CT PELVIS WITHOUT CONTRAST TECHNIQUE: Multidetector CT imaging of the pelvis was performed following the standard protocol without intravenous contrast. COMPARISON:  11/15/2020, 10/20/2020 FINDINGS: Urinary Tract: Retained contrast is seen within the visualized right renal cortex. No hydronephrosis. The distal ureters are decompressed. The bladder is decompressed. Bowel: Extensive sigmoid diverticulosis again noted. Presacral edema appears stable. Moderate diverticulosis of the terminal ileum again noted. Trace high attenuation free fluid has developed within the pelvis, likely representing a trace amount of hemoperitoneum. Vascular/Lymphatic: Bifurcated endovascular stent graft is again seen within the visualized abdominal aorta and common iliac arteries bilaterally. Embolization of the right internal iliac artery has been performed with multiple embolization coils  again identified resulting in significant streak artifact within the pelvis. Abdominal aortic aneurysm sac demonstrates relative stability measuring 6.9 x 6.1 cm in greatest dimension. Punctate foci of gas, possibly post procedural, again noted within the aneurysm sac. Moderate atherosclerotic calcification noted within the external iliac arteries bilaterally. Patency of the arterial vasculature is not well assessed on this non contrast study. Right common femoral central venous catheter extends into the inferior vena cava beyond the superior margin of the examination. Reproductive:  Uterus absent.  No adnexal masses. Other: Cholelithiasis noted. There is increasing subcutaneous edema within the flanks bilaterally. This is particularly prominent within the left gluteal region and this may represent more focal edema or hemorrhage in the setting of trauma. No discrete drainable fluid collection identified. Musculoskeletal: No acute bone abnormality. No lytic or blastic bone lesions. Degenerative changes are seen within the visualized lumbar spine. IMPRESSION: Stable appearance of the visualized bifurcated aortoiliac stent graft. Stable aneurysm sac with punctate foci of gas possibly postprocedural in nature. Patency of the vasculature is not well assessed on this noncontrast examination. Interval development of trace hemoperitoneum. Progressive body wall edema, possibly related to progressive anasarca. More focal infiltration within the left gluteal region may represent superimposed edema or interstitial hemorrhage in the setting of trauma and clinical correlation may be helpful. No acute bone abnormality. Cholelithiasis. Diverticulosis. Aortic Atherosclerosis (ICD10-I70.0). Aortic aneurysm NOS (ICD10-I71.9). Electronically Signed   By: Fidela Salisbury M.D.   On: 11/17/2020 19:32   DG Chest Port 1 View  Result Date: 11/18/2020 CLINICAL DATA:  Respiratory failure. On 10 liters of oxygen. History of aspiration  pneumonia. EXAM: PORTABLE CHEST 1 VIEW COMPARISON:  Radiographs 11/17/2020 and 01/16/2018. FINDINGS: 1138 hours. Cardiomegaly and aortic ectasia appear unchanged. There has been interval partial clearing of the right lung infiltrates. However, there is new patchy opacity at the left lung base. No pneumothorax or large pleural  effusion identified. The bones appear unchanged. IMPRESSION: Mild interval improvement in right lung infiltrates with new left basilar airspace disease. Findings suggest multilobar pneumonia, possibly on the basis of aspiration. Electronically Signed   By: Richardean Sale M.D.   On: 11/18/2020 12:07   EEG adult  Result Date: 11/17/2020 Greta Doom, MD     11/17/2020  6:02 PM History: 86 yo F with AMS Sedation: precedex Technique: This EEG was acquired with electrodes placed according to the International 10-20 electrode system (including Fp1, Fp2, F3, F4, C3, C4, P3, P4, O1, O2, T3, T4, T5, T6, A1, A2, Fz, Cz, Pz). The following electrodes were missing or displaced: none. Background: The background consists of generalized irregular predominantly delta range slow activity. There eis no clear PDR seen. There are occasional runs consistent with sleep structures which are symmetric. Photic stimulation: Physiologic driving is not performed EEG Abnormalities: 1) Generalized irregular slow activity 2) Absent PDR Clinical Interpretation: This EEG is consistent with a generalized non-specific cerebral dysfunction(encephalopathy). There was no seizure or seizure predisposition recorded on this study. Please note that lack of epileptiform activity on EEG does not preclude the possibility of epilepsy. Roland Rack, MD [facility] [phone] If 7pm- 7am, please page neurology on call as listed in [place].     Medications:    sodium chloride Stopped (11/15/20 1129)   sodium chloride 5 mL/hr at 11/19/20 0700   cefTRIAXone (ROCEPHIN)  IV Stopped (11/19/20 0010)    dexmedetomidine (PRECEDEX) IV infusion Stopped (11/19/20 0016)   magnesium sulfate bolus IVPB     norepinephrine (LEVOPHED) Adult infusion 8 mcg/min (11/19/20 0747)    Chlorhexidine Gluconate Cloth  6 each Topical Q0600   docusate sodium  100 mg Oral Daily   epoetin (EPOGEN/PROCRIT) injection  4,000 Units Intravenous Q M,W,F-HD   famotidine  20 mg Intravenous Daily   rOPINIRole  0.5 mg Oral q AM   rOPINIRole  1 mg Oral QHS   thiamine injection  100 mg Intravenous Daily   acetaminophen **OR** acetaminophen, alum & mag hydroxide-simeth, guaiFENesin-dextromethorphan, hydrALAZINE, labetalol, magnesium sulfate bolus IVPB, metoprolol tartrate, ondansetron (ZOFRAN) IV, phenol, potassium chloride  Assessment/ Plan:  86 y.o. female with ESRD, h/o lupus nephritis, A flutter, GERD, was admitted on 10/30/2020 for  Active Problems:   Restless leg syndrome   Anemia in CKD (chronic kidney disease)   Aspiration pneumonia (HCC)   ESRD (end stage renal disease) (HCC)   Ruptured abdominal aortic aneurysm (AAA) (HCC)   AAA (abdominal aortic aneurysm) (HCC)   Aneurysm, abdominal aortic, with rupture (HCC)   Acute encephalopathy   Cerebellar stroke (HCC)   Thrombocytopenia (HCC)   UTI (urinary tract infection)  Abdominal aortic aneurysm (AAA) without rupture (HCC) [I71.4] Ruptured abdominal aortic aneurysm (AAA) (HCC) [I71.3] AAA (abdominal aortic aneurysm) ([facility]) [I71.4] Aneurysm, abdominal aortic, with rupture ([place]) [I71.3]  #. ESRD, MWF We will schedule the patient for dialysis treatment tomorrow at the bedside if still in the CCU.  #. Anemia of CKD  Lab Results  Component Value Date   HGB 10.2 (L) 11/19/2020   Continue Epogen 4000 IV with dialysis on MWF schedule.  Next dose due tomorrow.  #. Secondary hyperparathyroidism of renal origin N 25.81   No results found for: PTH Lab Results  Component Value Date   PHOS 7.5 (H) 11/19/2020   Phosphorus still high at 7.5.  We will plan for  dialysis tomorrow which should help to bring down serum phosphorus.   #.  AAA, contained rupture S/p  emergent endovascular repair on 10/31/2020 Angiogram could not be completed because of altered mental status  # Acidosis Serum bicarbonate acceptable at 22.  #Hyperkalemia. Potassium slightly high at 5.4.  This should come down with dialysis tomorrow.  Continue to monitor.    LOS: 6 Ayjah Show 9/4/202212:15 PM  [place], [place]

## 2020-11-19 NOTE — Progress Notes (Signed)
Subjective  - POD #6, status post endovascular pair of ruptured abdominal aortic aneurysm  Acute events   Physical Exam:  Patient continues to have femoral and popliteal Doppler signals. Mottling to the legs is unchanged.  She is warm bilaterally down to the mid calf region.  There is discoloration of the toes. The patient is moving her left arm.  She opens her eyes to voice.       Assessment/Plan:  POD #6  No acute interval change.  Her leg exam is unchanged.  I do not think that her lower extremities are responsible for her neurologic issues.  The plan currently is to repeat her MRI in the morning.  I would also like to get a CT angiogram of the abdomen pelvis with bilateral runoff to better evaluate her blood flow as the initial exam, was not timed well for the lower extremities.  The husband was at the bedside and I updated him with our plan.  Marissa Hess 11/19/2020 6:06 PM --  Vitals:   11/19/20 1700 11/19/20 1800  BP: 113/65 (!) 100/57  Pulse: 74 77  Resp: (!) 31 (!) 25  Temp:    SpO2: 100% 97%    Intake/Output Summary (Last 24 hours) at 11/19/2020 1806 Last data filed at 11/19/2020 1800 Gross per 24 hour  Intake 1046.59 ml  Output --  Net 1046.59 ml     Laboratory CBC    Component Value Date/Time   WBC 11.7 (H) 11/19/2020 0609   HGB 10.2 (L) 11/19/2020 0609   HGB 10.9 (L) 09/06/2020 1035   HCT 29.9 (L) 11/19/2020 0609   HCT 33.5 (L) 09/06/2020 1035   PLT 93 (L) 11/19/2020 0609   PLT 201 09/06/2020 1035    BMET    Component Value Date/Time   NA 133 (L) 11/19/2020 0609   NA 140 09/06/2020 1035   K 5.4 (H) 11/19/2020 0609   CL 97 (L) 11/19/2020 0609   CO2 22 11/19/2020 0609   GLUCOSE 110 (H) 11/19/2020 0609   BUN 42 (H) 11/19/2020 0609   BUN 12 09/06/2020 1035   CREATININE 4.67 (H) 11/19/2020 0609   CALCIUM 7.9 (L) 11/19/2020 0609   GFRNONAA 9 (L) 11/19/2020 0609   GFRAA 20 (L) 11/26/2018 1346    COAG Lab Results  Component Value Date    INR 1.3 (H) 11/15/2020   INR 1.2 10/30/2020   INR 0.95 12/11/2017   No results found for: PTT  Antibiotics Anti-infectives (From admission, onward)    Start     Dose/Rate Route Frequency Ordered Stop   11/17/20 0000  clindamycin (CLEOCIN) IVPB 300 mg  Status:  Discontinued       Note to Pharmacy: To be given in specials   300 mg 100 mL/hr over 30 Minutes Intravenous  Once 12/02/2020 1256 12/13/2020 1446   11/23/2020 1307  clindamycin (CLEOCIN) 300 MG/50ML IVPB       Note to Pharmacy: Corlis Hove   : cabinet override      11/22/2020 1307 11/17/20 0114   12/15/2020 0000  ceFAZolin (ANCEF) IVPB 1 g/50 mL premix       Note to Pharmacy: Send with pt to OR   1 g 100 mL/hr over 30 Minutes Intravenous On call 11/15/20 1901 11/17/20 0000   11/15/20 2330  cefTRIAXone (ROCEPHIN) 1 g in sodium chloride 0.9 % 100 mL IVPB        1 g 200 mL/hr over 30 Minutes Intravenous Every 24 hours 11/15/20 2239  11/21/20 2359   11/14/20 0000  clindamycin (CLEOCIN) IVPB 300 mg        300 mg 100 mL/hr over 30 Minutes Intravenous Every 12 hours 10/30/2020 1446 11/14/20 1415   10/29/2020 1148  clindamycin (CLEOCIN) 300 MG/50ML IVPB       Note to Pharmacy: Genelle Bal   : cabinet override      11/02/2020 1148 11/09/2020 1232   10/27/2020 1145  clindamycin (CLEOCIN) IVPB 300 mg        300 mg 100 mL/hr over 30 Minutes Intravenous On call to O.R. 11/11/2020 1138 10/18/2020 1153        V. Leia Alf, M.D., [facility] [place] Office: [phone] Pager:  [phone]

## 2020-11-19 NOTE — Progress Notes (Signed)
NAME:  Marissa Hess, MRN:  [id number], DOB:  [birth year], LOS: 6 ADMISSION DATE:  11/01/2020, CONSULTATION DATE: 11/06/2020 REFERRING MD: Dr. Delana Meyer CHIEF COMPLAINT: Ruptured AAA  History of Present Illness:  This is an 86 yo female who presented to [facility] ER on 08/29 with c/o lower back/abdominal pain and watery diarrhea onset of symptoms 2 weeks prior to presentation.  She does have a hx of ESRD on hemodialysis M/F and missed her HD session the morning of 08/29.    ED course: Upon arrival to the ER lab results revealed BUN 28, creatinine 3.96, albumin 3.1, and hgb 11.5.  CT Abd Pelvis revealed a large intrarenal AAA with periaortic edema concerning for small leak.  Pt also hypertensive with sbp 160-180's.  Pt placed on nicardipine gtt for bp control and vascular surgery consulted. Per vascular surgery recommendations Eliquis (hx atrial flutter) reversed with Andexxa and pt transported to the OR for emergent selective angiography, coil embolization, and stent graft placement.  Pt admitted to ICU postop and PCCM team consulted to assist with management.    Pertinent  Medical History  Allergies  Arthritis  ESRD on HD: M/F GERD  HTN Lupus Lupus nephritis  Left Eye Macular Degeneration  MI (2007) Osteoporosis  Atrial Flutter Oropharyngeal Dysphagia    Significant Hospital Events: Including procedures, antibiotic start and stop dates in addition to other pertinent events   08/29: Pt admitted to ICU s/p emergent selective angiography, coil embolization and stent graft placement of AAA, juxtarenal    with contained rupture  08/30: Hemodynamically stable, BP controlled (no IV  antihypertensives being required), with ACUTE METABOLIC ENCEPHALOPATHY (? Pain meds vs ICU delirium) 8/31: Obtaining CT Head and UA for AMS, CTA abdomen &  pelvis with runoff due to LLE pain;  with worsening Thrombocytopenia, obtaining workup 9/1: UA consistent with UTI, placed on Ceftriaxone, plan for MRI and  considering Neurology consult, Vascular Surgery planning for Angiogram of LLE 9/1: MR Brain revealed small acute infarcts in the cerebellum and left temporal lobe. Abnormal appearance of the proximal basilar artery which may reflect reduced flow from a stenosis or segmental occlusion Head and neck CTA is recommended for further evaluation. Mild chronic small vessel ischemic disease and cerebral atrophy. 09/1 MR Lumbar Thoracic Spine: revealed Severely motion degraded examination. Chronic L2 and L3 compression fractures. Lumbar disc degeneration greatest at L5-S1 where there is mild- mild-to-moderate neural foraminal stenosis. No spinal stenosis. 09/2 CT Lumbar Spine: Redemonstrated chronic vertebral compression fractures at T11, L2 and L3. Lumbar spondylosis, as described on the recent prior lumbar spine. MRI of 12/05/2020. Please refer to this prior report for further description. Thoracolumbar levocurvature. Trace T11-T12 and L1-L2 grade 1 retrolisthesis. Aortoiliac stent graft for abdominal aortic aneurysm repair. There is persistent enhancement of the right kidney from the CT angiogram head/neck performed earlier today. This may be secondary to chronic renal disease. However, renal cortical necrosis can not be excluded given the patient's history. Extensive airspace consolidation within the imaged right lung base. Correlate for pneumonia. Mild dependent atelectasis and likely small pleural effusion within the imaged left lung base. Nonspecific small volume abdominopelvic free fluid. Sigmoid diverticulosis. 09/2 CT Pelvis: Stable appearance of the visualized bifurcated aortoiliac stent graft. Stable aneurysm sac with punctate foci of gas possibly postprocedural in nature. Patency of the vasculature is not well assessed on this noncontrast examination. Interval development of trace hemoperitoneum. Progressive body wall edema, possibly related to progressive anasarca. More focal infiltration within the left  gluteal region may  represent superimposed edema or interstitial hemorrhage in the setting of trauma and clinical correlation may be helpful. No acute bone abnormality. Cholelithiasis. Diverticulosis. Aortic Atherosclerosis (ICD10-I70.0). Aortic aneurysm NOS (ICD10-I71.9). 09/2: EEG>>consistent with a generalized non-specific cerebral  dysfunction(encephalopathy). There was no seizure or seizure predisposition recorded on this study. Please note that lack of epileptiform activity on EEG does not preclude the possibility of epilepsy 09/3: Attempted to place Dobbhoff and Nasogastric tube, however unsuccessful due to inability of pt to swallow on command.  Therefore, placed order for IR to place Dobbhoff vs. G tube  09/4: Pt remains encephalopathic intermittently requiring Precedex gtt   Interim History / Subjective:  Pt off precedex gtt with slight agitation moving bilateral upper extremities no signs of respiratory distress present   Objective   Blood pressure 103/78, pulse 80, temperature 99.9 F (37.7 C), temperature source Axillary, resp. rate (!) 32, height '5\' 3"'$  (1.6 m), weight 58.2 kg, SpO2 98 %.        Intake/Output Summary (Last 24 hours) at 11/19/2020 0759 Last data filed at 11/19/2020 0700 Gross per 24 hour  Intake 813.46 ml  Output --  Net 813.46 ml   Filed Weights   10/29/2020 1500  Weight: 58.2 kg    Examination: General: Acute on chronically  ill appearing elderly female with slightly restlessness/agitated HENT: Atraumatic, normocephalic, neck supple, no JVD Lungs: faint rhonchi throughout, even, non labored with nasal trumpet in left nare   Cardiovascular: nsr, rrr, no R/G, 2+ radial/1+ palpable dorsalis pedis left foot/1+ bilateral popliteal pulses present Abdomen: +BS x4, obese, soft, non tender, non distended  Extremities: unable to move bilateral lower extremities, bilateral lower extremities from the knee down cool to touch, mottled left lower extremity Skin: bilateral  groin dressings in place no bleeding or hematoma present; RUE fistula +bruit and thrill  Neuro: slightly agitated, unable to follow commands, spontaneously moves upper extremities, unable to move bilateral lower extremities, pupils PERRL GU: indwelling foley discontinue 09/2  Resolved Hospital Problem list     Assessment & Plan:   AAA, juxtarenal with contained rupture s/p emergent angiography, coil embolization, and stent graft placement  LLE Rest pain Hypotension secondary to hypovolemia vs. sepsis  Hx: Atrial flutter/fibrillation, Hypertension -Vascular Surgery following, appreciate input ~ will follow recommendations -Continuous cardiac monitoring -Prn levophed gtt to maintain map >65 -Monitor for s/sx of bleeding -Holding outpatient Eliquis -Hold outpatient carvedilol due to soft BP -Completed Clindamycin for surgical prophylaxis  -Vascular surgery attempted to proceed with angiogram due to CTA AO+Bifem revealing right popliteal artery occlusion above the knee 09/1, however unable to proceed due to hypotension  -Per vascular sugery low suspicion pts encephalopathy is due to bilateral lower extremity ischemia, and does not think surgical intervention would be beneficial at this point.  He will consider CT scan if no improvement/worsening of bilateral lower extremity discoloration/perfusion  ESRD on Hemodialysis  Hyperkalemia Hyponatremia, suspect due to D5W infusion~slowly improving  -Monitor I&O's / urinary output -Follow BMP -Ensure adequate renal perfusion -Avoid nephrotoxic agents as able -Replace electrolytes as indicated -Nephrology following, appreciate input -HD as per Nephrology recommendations~likely will need HD today due to hyperkalemia will defer to Nephrology   Anemia of Chronic Disease Thrombocytopenia>>slolwly improving -Monitor for S/Sx of bleeding -Trend CBC - Iron/TIBC -SCD's for VTE Prophylaxis (no chemical prophylaxis until approved by Vascular Surgery,  hold outpatient Eliquis) -Transfuse for Hgb <7 -Transfuse platelets for platelet count of <50 with active bleeding -Thrombocytopenia workup not concerning for DIC or TTP, peripheral smear  normal (suspect thrombocytopenia is due to recent AAA with thrombus and recent repair) -HIT panel/serotonin assay negative   Acute Metabolic Encephalopathy~worsening  CT Head negative 8/31 CTA Head Neck 09/2 revealed occlusion of a right P2 PCA branch immediately distal to a bifurcation. Severe left M1 MCA stenosis. Moderate to severe left paraclinoid ICA stenosis. Severe right and moderate left A3 ACA stenosis. Moderate proximal right M2 MCA branch stenosis. 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 Otherwise, no evidence of interval acute abnormality -Provide supportive care -Promote normal sleep/wake cycle -Neurology consulted appreciate input-encephalopathy multifactorial  -Prn Precedex gtt for severe delirium/agitation  Urinary Tract Infection -Monitor fever curve -Trend WBC's & Procalcitonin -Follow cultures as above -Continue empiric Ceftriaxone for now awaiting urine culture results -Bladder scan to assess for urinary retention   Hypoglycemia>>improving -CBG's q4h; Target range of >90 -Follow ICU Hypo/Hyperglycemia protocol   Best Practice (right click and "Reselect all SmartList Selections" daily)   Diet/type: NPO; Will attempt NG tube placement again and if unsuccessful will start TPN in the interim until IR available to place Dobbhoff vs. Percutaneous G tube; IR consulted for Dobbhoff placement utilizing fluoro vs. G tube placement  DVT prophylaxis: SCD GI prophylaxis: Pepcid Lines: N/A Foley:  N/A Code Status:  full code Last date of multidisciplinary goals of care discussion [11/17/2020]  Pts daughter at bedside and update regarding plan of care and all  questions answered 11/19/2020  CBC: Recent Labs  Lab 10/23/2020 1537 11/14/20 0611 11/15/20 0456 11/18/2020 0519 11/17/20 0525 11/18/20 0436 11/19/20 0609  WBC 7.5   < > 5.9 5.8 4.2 7.6 11.7*  NEUTROABS 6.0  --   --   --   --   --   --   HGB 11.3*   < > 9.4* 10.8* 10.2* 9.7* 10.2*  HCT 35.2*   < > 28.9* 32.4* 29.2* 28.2* 29.9*  MCV 89.3   < > 88.4 89.3 85.6 83.9 84.0  PLT 152   < > 82* 101* 115* 98* 93*   < > = values in this interval not displayed.    Basic Metabolic Panel: Recent Labs  Lab 10/23/2020 1537 11/14/20 0611 11/23/2020 2003 11/17/20 0018 11/17/20 0525 11/17/20 1142 11/17/20 1620 11/18/20 0436 11/19/20 0609  NA  --    < > 128* 128*  128* 129* TEST WILL BE CREDITED 131* 132* 133*  K  --    < > 6.2* 6.0*  6.0* 5.7* TEST WILL BE CREDITED 4.9 5.0 5.4*  CL  --    < > 93* 94*  94* 94* TEST WILL BE CREDITED 97* 96* 97*  CO2  --    < > 19* 19*  18* 21* TEST WILL BE CREDITED '23 23 22  '$ GLUCOSE  --    < > 89 81  82 107* TEST WILL BE CREDITED 94 99 110*  BUN  --    < > 39* 41*  40* 42* TEST WILL BE CREDITED 27* 32* 42*  CREATININE  --    < > 4.56* 4.76*  4.77* 4.85* TEST WILL BE CREDITED 3.56* 3.87* 4.67*  CALCIUM  --    < > 7.8* 7.7*  7.7* 7.7* TEST WILL BE CREDITED 7.6* 7.5* 7.9*  MG 1.8  --   --  2.4  --   --  2.1 2.2  --   PHOS 6.3*   < > 9.3*  9.5* 9.9*  --   --  7.5* 7.5*   < > = values in this interval not displayed.   GFR: Estimated Creatinine Clearance: 7.4 mL/min (A) (by C-G formula based on SCr of 4.67 mg/dL (H)). Recent Labs  Lab 12/12/2020 0519 12/08/2020 0818 11/29/2020 1107 12/15/2020 1457 12/08/2020 2003 11/17/20 0018 11/17/20 0525 11/18/20 0436 11/19/20 0609  PROCALCITON  --   --   --  15.40  --   --  16.79 13.35  --   WBC 5.8  --   --   --   --   --  4.2 7.6 11.7*  LATICACIDVEN  --  4.2* 2.8*  --  3.5* 2.3*  --   --   --     Liver Function Tests: Recent Labs  Lab 11/05/2020 0615 11/15/20 0456 12/04/2020 2003 11/17/20 0018 11/17/20 0525  11/18/20 0436 11/19/20 0609  AST 17  --   --   --   --   --   --   ALT 10  --   --   --   --   --   --   ALKPHOS 47  --   --   --   --   --   --   BILITOT 1.3*  --   --   --   --   --   --   PROT 7.1  --   --   --   --   --   --   ALBUMIN 3.1*   < > 2.1* 2.1* 2.1* 1.9* 2.1*   < > = values in this interval not displayed.   Recent Labs  Lab 10/27/2020 0615  LIPASE 27   Recent Labs  Lab 11/17/20 1142  AMMONIA 13    ABG    Component Value Date/Time   HCO3 24.9 11/17/2020 1114   ACIDBASEDEF 0.7 11/17/2020 1114   O2SAT 66.2 11/17/2020 1114     Coagulation Profile: Recent Labs  Lab 11/14/2020 0946 11/15/20 0932  INR 1.2 1.3*    Cardiac Enzymes: Recent Labs  Lab 11/14/20 1730 11/17/20 1142 11/17/20 1620  CKTOTAL 5,364* 13,125* 10,035*    HbA1C: No results found for: HGBA1C  CBG: Recent Labs  Lab 11/18/20 2008 11/18/20 2348 11/19/20 0025 11/19/20 0331 11/19/20 0753  GLUCAP 94 61* 92 111* 97    Review of Systems: Positives in BOLD   Unable to assess due to AMS  Past Medical History:  She,  has a past medical history of Allergy, Arthritis, Chronic kidney disease, GERD (gastroesophageal reflux disease), Hypertension, Lupus ([place]) (2002), Macular degeneration of left eye, Myocardial infarction ([place]) (2007), and Osteoporosis.   Surgical History:   Past Surgical History:  Procedure Laterality Date   A/V FISTULAGRAM Right 01/16/2018   Procedure: A/V FISTULAGRAM;  Surgeon: [name], MD;  Location: [place] [facility];  Service: Cardiovascular;  Laterality: Right;   ABDOMINAL HYSTERECTOMY     AV FISTULA PLACEMENT Right 12/17/2017   Procedure: ARTERIOVENOUS (AV) FISTULA CREATION ( BRACHIO CEPHALIC POSS. BRACHIO BASILIC );  Surgeon: Algernon Huxley, MD;  Location: [facility];  Service: Vascular;  Laterality: Right;   CARDIOVERSION N/A 10/04/2020   Procedure: CARDIOVERSION;  Surgeon: Kate Sable, MD;  Location: [facility];  Service: Cardiovascular;   Laterality: N/A;   CATARACT EXTRACTION W/PHACO Right 01/03/2016   Procedure: CATARACT EXTRACTION PHACO AND INTRAOCULAR LENS PLACEMENT ([place]);  Surgeon: Leandrew Koyanagi, MD;  Location: [facility];  Service: Ophthalmology;  Laterality: Right;  COLON SURGERY     intestine became twisted after hernia sugery   ENDOVASCULAR REPAIR/STENT GRAFT N/A 10/29/2020   Procedure: ENDOVASCULAR REPAIR/STENT GRAFT;  Surgeon: Katha Cabal, MD;  Location: [place] [facility];  Service: Cardiovascular;  Laterality: N/A;   EYE SURGERY     cataract   HERNIA REPAIR     TEMPORAL ARTERY BIOPSY / LIGATION     TEMPORARY DIALYSIS CATHETER N/A 11/26/2020   Procedure: TEMPORARY DIALYSIS CATHETER;  Surgeon: Katha Cabal, MD;  Location: [place] [facility];  Service: Cardiovascular;  Laterality: N/A;   TONSILLECTOMY       Social History:   reports that she quit smoking about 26 years ago. Her smoking use included cigarettes. She started smoking about 36 years ago. She has never used smokeless tobacco. She reports that she does not drink alcohol and does not use drugs.   Family History:  Her family history includes Rheum arthritis in her father; Stomach cancer in her mother; Thyroid disease in her daughter; Varicose Veins in her maternal aunt. There is no history of Breast cancer.   Allergies Allergies  Allergen Reactions   Codeine Itching   Ibuprofen     Avoids "because of lupus"   Morphine And Related Other (See Comments)    Confusion/Agitation   Sulfa Antibiotics Diarrhea   Vioxx [Rofecoxib]     Avoids "because of lupus"   Celebrex [Celecoxib] Rash    Avoids "because of lupus"   Penicillins Rash    Reaction: over 30 years ago     Home Medications  Prior to Admission medications   Medication Sig Start Date End Date Taking? Authorizing Provider  apixaban (ELIQUIS) 2.5 MG TABS tablet Take 1 tablet (2.5 mg total) by mouth 2 (two) times daily. 09/06/20   Cannady, Henrine Screws T, NP  carvedilol  (COREG) 25 MG tablet Take 1 tablet (25 mg total) by mouth 2 (two) times daily with a meal. 11/12/19   Cannady, Jolene T, NP  cholecalciferol (VITAMIN D) 1000 units tablet Take 1,000 Units by mouth daily.     [provider]  ferrous sulfate (SLOW FE) 160 (50 Fe) MG TBCR SR tablet Take 160 mg by mouth daily.    [provider]  folic acid (FOLVITE) 1 MG tablet Take 1 tablet (1 mg total) by mouth daily. 12/09/19   Johnson, Megan P, DO  lidocaine (LIDODERM) 5 % Place 1 patch onto the skin daily. Remove & Discard patch within 12 hours or as directed by MD Patient not taking: Reported on 10/28/2020 11/08/20   Marnee Guarneri T, NP  mirtazapine (REMERON) 7.5 MG tablet Take 7.5 mg by mouth at bedtime. 08/31/20   [provider]  rOPINIRole (REQUIP) 0.5 MG tablet Take one tablet (0.5 MG) by mouth in the morning and then take two tablets (1 MG) by mouth at night before bedtime. Patient taking differently: Take 0.5-1 mg by mouth See admin instructions. Take one tablet (0.5 MG) by mouth in the morning and then take two tablets (1 MG) by mouth at night before bedtime. 01/11/20   Cannady, Henrine Screws T, NP  tiZANidine (ZANAFLEX) 2 MG tablet Take 1 tablet (2 mg total) by mouth daily as needed for muscle spasms. 11/04/2020   Venita Lick, NP     Critical care time: 35 minutes     Rosilyn Mings, AGNP  Pulmonary/Critical Care Pager [phone] (please enter 7 digits) PCCM Consult Pager [phone] (please enter 7 digits)

## 2020-11-20 ENCOUNTER — Inpatient Hospital Stay: Payer: Medicare Other

## 2020-11-20 LAB — CBC
HCT: 25.3 % — ABNORMAL LOW (ref 36.0–46.0)
Hemoglobin: 8.2 g/dL — ABNORMAL LOW (ref 12.0–15.0)
MCH: 29.6 pg (ref 26.0–34.0)
MCHC: 32.4 g/dL (ref 30.0–36.0)
MCV: 91.3 fL (ref 80.0–100.0)
Platelets: 52 10*3/uL — ABNORMAL LOW (ref 150–400)
RBC: 2.77 MIL/uL — ABNORMAL LOW (ref 3.87–5.11)
RDW: 20.5 % — ABNORMAL HIGH (ref 11.5–15.5)
WBC: 13.3 10*3/uL — ABNORMAL HIGH (ref 4.0–10.5)
nRBC: 3.1 % — ABNORMAL HIGH (ref 0.0–0.2)

## 2020-11-20 LAB — BASIC METABOLIC PANEL
Anion gap: 19 — ABNORMAL HIGH (ref 5–15)
BUN: 50 mg/dL — ABNORMAL HIGH (ref 8–23)
CO2: 20 mmol/L — ABNORMAL LOW (ref 22–32)
Calcium: 7.6 mg/dL — ABNORMAL LOW (ref 8.9–10.3)
Chloride: 95 mmol/L — ABNORMAL LOW (ref 98–111)
Creatinine, Ser: 5.19 mg/dL — ABNORMAL HIGH (ref 0.44–1.00)
GFR, Estimated: 8 mL/min — ABNORMAL LOW (ref >60)
Glucose, Bld: 294 mg/dL — ABNORMAL HIGH (ref 70–99)
Potassium: 6.7 mmol/L (ref 3.5–5.1)
Sodium: 134 mmol/L — ABNORMAL LOW (ref 135–145)

## 2020-11-20 LAB — GLUCOSE, CAPILLARY: Glucose-Capillary: 111 mg/dL — ABNORMAL HIGH (ref 70–99)

## 2020-11-20 LAB — URINE CULTURE: Culture: >=100000 — AB

## 2020-11-20 LAB — LACTIC ACID, PLASMA: Lactic Acid, Venous: >11 mmol/L (ref 0.5–1.9)

## 2020-11-20 LAB — CK: Total CK: 5417 U/L — ABNORMAL HIGH (ref 38–234)

## 2020-11-20 LAB — MAGNESIUM: Magnesium: 2.4 mg/dL (ref 1.7–2.4)

## 2020-11-20 LAB — TROPONIN I (HIGH SENSITIVITY): Troponin I (High Sensitivity): 506 ng/L (ref <18)

## 2020-11-20 MED ORDER — CALCIUM GLUCONATE-NACL 1-0.675 GM/50ML-% IV SOLN
1.0000 g | Freq: Once | INTRAVENOUS | Status: DC
  Filled 2020-11-20: qty 50

## 2020-11-20 MED ORDER — EPINEPHRINE 1 MG/10ML IJ SOSY
PREFILLED_SYRINGE | INTRAMUSCULAR | Status: AC
  Filled 2020-11-20: qty 10

## 2020-11-20 MED ORDER — EPINEPHRINE 1 MG/10ML IJ SOSY
PREFILLED_SYRINGE | INTRAMUSCULAR | Status: AC
  Filled 2020-11-20: qty 30

## 2020-11-20 MED FILL — Medication: Qty: 1 | Status: AC

## 2020-11-21 ENCOUNTER — Telehealth: Payer: Medicare Other

## 2020-11-21 LAB — CULTURE, BLOOD (ROUTINE X 2)
Culture: NO GROWTH
Culture: NO GROWTH

## 2020-11-21 LAB — CULTURE, RESPIRATORY W GRAM STAIN

## 2020-11-21 LAB — THYROID PANEL
Free Thyroxine Index: 0.6 — ABNORMAL LOW (ref 1.2–4.9)
T3 Uptake Ratio: 33 % (ref 24–39)
T4, Total: 1.9 ug/dL — ABNORMAL LOW (ref 4.5–12.0)

## 2020-11-21 LAB — VITAMIN B1: Vitamin B1 (Thiamine): 72.9 nmol/L (ref 66.5–200.0)

## 2020-12-16 NOTE — Progress Notes (Signed)
Chaplain provided family supportive presence during code and death. Family connected with local pastor during code event. Chaplain supported husband to aid him in communicating with others. Many family members arrived and were supporting each other well. Husband provided [facility] to handle final arrangements.     11/28/2020 0200  Clinical Encounter Type  Visited With Patient and family together  Visit Type Code;Death

## 2020-12-16 NOTE — ED Provider Notes (Signed)
[place]  Department of Emergency Medicine   Code Blue CONSULT NOTE  Chief Complaint: Cardiac arrest/unresponsive   Level V Caveat: Unresponsive  History of present illness: I was contacted by the hospital for a CODE BLUE cardiac arrest upstairs and presented to the patient's bedside.   Patient was status post AAA repair on Precedex drip pending MRI when she bradycardia down to have lost pulses  ROS: Unable to obtain, Level V caveat  Scheduled Meds: Continuous Infusions: PRN Meds: Past Medical History:  Diagnosis Date   Allergy    Arthritis    Chronic kidney disease    CKD V   GERD (gastroesophageal reflux disease)    Hypertension    Lupus ([place]) 2002   Macular degeneration of left eye    Myocardial infarction [facility]) 2007   Osteoporosis    Past Surgical History:  Procedure Laterality Date   A/V FISTULAGRAM Right 01/16/2018   Procedure: A/V FISTULAGRAM;  Surgeon: [name], MD;  Location: [place] [facility];  Service: Cardiovascular;  Laterality: Right;   ABDOMINAL HYSTERECTOMY     AV FISTULA PLACEMENT Right 12/17/2017   Procedure: ARTERIOVENOUS (AV) FISTULA CREATION ( BRACHIO CEPHALIC POSS. BRACHIO BASILIC );  Surgeon: Algernon Huxley, MD;  Location: [facility];  Service: Vascular;  Laterality: Right;   CARDIOVERSION N/A 10/04/2020   Procedure: CARDIOVERSION;  Surgeon: Kate Sable, MD;  Location: [facility];  Service: Cardiovascular;  Laterality: N/A;   CATARACT EXTRACTION W/PHACO Right 01/03/2016   Procedure: CATARACT EXTRACTION PHACO AND INTRAOCULAR LENS PLACEMENT ([place]);  Surgeon: Leandrew Koyanagi, MD;  Location: [place];  Service: Ophthalmology;  Laterality: Right;   COLON SURGERY     intestine became twisted after hernia sugery   ENDOVASCULAR REPAIR/STENT GRAFT N/A 11/11/2020   Procedure: ENDOVASCULAR REPAIR/STENT GRAFT;  Surgeon: Katha Cabal, MD;  Location: [employer] [facility];  Service: Cardiovascular;  Laterality: N/A;    EYE SURGERY     cataract   HERNIA REPAIR     TEMPORAL ARTERY BIOPSY / LIGATION     TEMPORARY DIALYSIS CATHETER N/A 11/24/2020   Procedure: TEMPORARY DIALYSIS CATHETER;  Surgeon: Katha Cabal, MD;  Location: [place] [facility];  Service: Cardiovascular;  Laterality: N/A;   TONSILLECTOMY     Social History   Socioeconomic History   Marital status: Married    Spouse name: Not on file   Number of children: Not on file   Years of education: Not on file   Highest education level: High school graduate  Occupational History    Comment: Recruitment consultant; [employer]; child care  Tobacco Use   Smoking status: Former    Types: Cigarettes    Start date: 09/11/1984    Quit date: 09/26/1994    Years since quitting: 26.1   Smokeless tobacco: Never  Vaping Use   Vaping Use: Never used  Substance and Sexual Activity   Alcohol use: No    Alcohol/week: 0.0 standard drinks   Drug use: No   Sexual activity: Not on file  Other Topics Concern   Not on file  Social History Narrative   Not on file   Social Determinants of Health   Financial Resource Strain: Not on file  Food Insecurity: Not on file  Transportation Needs: Not on file  Physical Activity: Not on file  Stress: Not on file  Social Connections: Not on file  Intimate Partner Violence: Not on file   Allergies  Allergen Reactions   Codeine Itching  Ibuprofen     Avoids "because of lupus"   Morphine And Related Other (See Comments)    Confusion/Agitation   Sulfa Antibiotics Diarrhea   Vioxx [Rofecoxib]     Avoids "because of lupus"   Celebrex [Celecoxib] Rash    Avoids "because of lupus"   Penicillins Rash    Reaction: over 30 years ago    Last set of Vital Signs (not current) Vitals:   December 19, 2020 0100 12/19/2020 0145  BP: (!) 82/55   Pulse:    Resp: (!) 27   Temp:    SpO2:  100%      Physical Exam  Gen: unresponsive Cardiovascular: pulseless  Resp: apneic. Breath sounds equal bilaterally with bagging  Abd:  nondistended  Neuro: GCS 3, unresponsive to pain  HEENT: No blood in posterior pharynx, gag reflex absent  Neck: No crepitus  Musculoskeletal: No deformity  Skin: warm  Procedures  INTUBATION Procedure Name: Intubation Date/Time: 12-19-20 11:37 PM Performed by: Vanessa Belmont, MD Oxygen Delivery Method: Ambu bag Laryngoscope Size: Glidescope Grade View: Grade II Tube size: 7.5 mm Number of attempts: 3 Airway Equipment and Method: Video-laryngoscopy Placement Confirmation: ETT inserted through vocal cords under direct vision, Positive ETCO2, CO2 detector and Breath sounds checked- equal and bilateral Dental Injury: Bloody posterior oropharynx  Difficulty Due To: Difficulty was anticipated      Medical Decision making  Patient status post cardiac arrest.  Intubation was successful  Assessment and Plan  After completion of CPR patient did get pulses back and care was continued by the ICU team    Vanessa [place], MD 19-Dec-2020 2341

## 2020-12-16 NOTE — Death Summary Note (Addendum)
DEATH SUMMARY   Patient Details  Name: Marissa Hess MRN: [id number] DOB: 10-03-1934  Admission/Discharge Information   Admit Date:  November 17, 2020  Date of Death: Date of Death: 11-24-2020  Time of Death: Time of Death: 0220  Length of Stay: 27-May-2022  Referring Physician: Venita Lick, NP   Reason(s) for Hospitalization  AAA Rupture  Diagnoses  Preliminary cause of death:  Secondary Diagnoses (including complications and co-morbidities):  Active Problems:   Restless leg syndrome   Anemia in CKD (chronic kidney disease)   Aspiration pneumonia (HCC)   ESRD (end stage renal disease) (HCC)   Ruptured abdominal aortic aneurysm (AAA) (HCC)   AAA (abdominal aortic aneurysm) (HCC)   Aneurysm, abdominal aortic, with rupture (HCC)   Acute encephalopathy   Cerebellar stroke (HCC)   Thrombocytopenia (HCC)   UTI (urinary tract infection)   Brief Hospital Course (including significant findings, care, treatment, and services provided and events leading to death)  Marissa Hess is a 86 y.o. year old female who was admitted to ICU on Nov 18, 2022  s/p emergent elective angiography coil embolization and stent graft placement. During the course of her hospitalization, she remained hemodynamically stable, BP controlled (no IV antihypertensives was required),she however remained encephalopathic which was thought to be due to Pain meds vs ICU delirium). On 8/31, CT Head and UA was obtained for AMS. CT head showed no acute intracranial abnormality. CTA abdomen & pelvis with runoff due to LLE pain. UA was consistent with UTI, placed on Ceftriaxone, with plan for MRI and Neurology consult. MR Brain was obtained and revealed small acute infarcts in the cerebellum and left temporal lobe. Abnormal appearance of the proximal basilar artery which may reflect reduced flow from a stenosis or segmental occlusion. Head and neck CTA was obtained and showed multifocal stenosis, P2 occlusion. Neurology was consulted who did not  suspect findings on MRI was the cause of encephalopathy.  09/1 MR Lumbar Thoracic Spine revealed Severely motion degraded examination. Chronic L2 and L3 compression fractures.Lumbar disc degeneration greatest at L5-S1 where there is mild- mild-to-moderate neural foraminal stenosis. No spinal stenosis. 09/2 CT Lumbar Spine: Redemonstrated chronic vertebral compression fractures at T11, L2 and L3. Lumbar spondylosis, as described on the recent prior lumbar spine. 09/2: EEG>>consistent with a generalized non-specific cerebral  dysfunction(encephalopathy). There was no seizure or seizure predisposition recorded on the study. 09/3: Attempted to place Dobbhoff and Nasogastric tube, however unsuccessful due to inability of pt to swallow on command.  Therefore, placed order for IR to place Dobbhoff vs. G tube  09/4: Pt remains encephalopathic intermittently requiring Precedex gtt. On the night of 11-24-2020, code blue was called for unresponsiveness, patient apparently became bradycardic in the 08-Jun-2022 and went into asystole. CPR/ACLS was initiated with 4 rounds of epinephrine, sodium bicarb x 2, dextrose x 1 administration and defibrillation x1 prior to ROSC. Patient was intubated by EDP.   Patient again went into asystole however family who were currently at the bedside decided to terminate resuscitative efforts.  Patient was pronounced at 0220.  Family at the bedside with chaplain.  Family thanked staff for the care that was offered to patient.  Pertinent Labs and Studies  Significant Diagnostic Studies CT ABDOMEN PELVIS WO CONTRAST  Result Date: 2020/11/17 CLINICAL DATA:  Lower back and abdominal pain for 2 weeks, diarrhea since Saturday, vomiting since yesterday, suspected diverticulitis, history end-stage renal disease on dialysis EXAM: CT ABDOMEN AND PELVIS WITHOUT CONTRAST TECHNIQUE: Multidetector CT imaging of the abdomen and pelvis was performed  following the standard protocol without IV contrast. Sagittal and  coronal MPR images reconstructed from axial data set. No oral contrast administered. COMPARISON:  None FINDINGS: Lower chest: Subsegmental atelectasis LEFT lower lobe. Tiny RIGHT pleural effusion. Hepatobiliary: Small calcified gallstones dependently in gallbladder. Liver unremarkable. Pancreas: Normal appearance Spleen: Normal appearance Adrenals/Urinary Tract: Atrophic kidneys. Few tiny renal cysts. No urinary tract calcification or dilatation. Bladder unremarkable. Stomach/Bowel: Diverticulosis of descending and sigmoid colon without evidence of diverticulitis. Appendix not visualized. Stomach and bowel loops otherwise unremarkable for technique. Vascular/Lymphatic: Extensive atherosclerotic calcifications aorta, iliac arteries, minimally femoral arteries. Tortuous abdominal aorta. Fusiform aneurysmal dilatation of mid to distal abdominal aorta measuring 7.3 x 6.3 cm in greatest axial dimensions and extending 6.4 cm length. High attenuation intramural hematoma identified. Perianeurysmal fat planes demonstrate minimal infiltration highly suspicious for aneurysmal leak. Aneurysmal dilatation extends into the aortic bifurcation with dilatation of the common iliac arteries bilaterally 20 mm RIGHT and 17 mm LEFT. Aneurysmal dilatation of RIGHT internal iliac artery 26 mm diameter. No adenopathy. Reproductive: Uterus surgically absent.  Ovaries not visualized. Other: Small amount of free fluid in pelvis and perisplenic. No free air. LEFT inguinal hernia containing fat. Prior ventral hernia repair. Nonspecific stranding of presacral fat and scattered subcutaneous stranding, question related to patient's end-stage renal disease, missed dialysis this morning. Musculoskeletal: Osseous demineralization. IMPRESSION: Fusiform aneurysmal dilatation of the mid to distal abdominal aorta measuring 7.3 x 6.3 cm in greatest axial dimensions and extending 6.4 cm length, associated with high attenuation acute intramural hematoma and  minimal infiltration of fat planes highly suspicious for aneurysmal leak. These findings were emergently called to Dr. Quentin Cornwall on 11/07/2020 at 0920 hours, prior to dictation of this report. Aneurysmal dilatation of common iliac arteries bilaterally, and RIGHT internal iliac artery to 26 mm diameter, without evidence of hemorrhage/leak. Distal colonic diverticulosis without evidence of diverticulitis. Cholelithiasis. Small amount of nonspecific free intraperitoneal fluid. LEFT inguinal hernia containing fat. Aortic Atherosclerosis (ICD10-I70.0). Critical Value/emergent results were called by telephone at the time of interpretation on 10/17/2020 at 9:30 am to provider Merlyn Lot , who verbally acknowledged these results. Electronically Signed   By: Lavonia Dana M.D.   On: 10/24/2020 09:31   CT ANGIO HEAD NECK W WO CM  Result Date: 11/17/2020 CLINICAL DATA:  Stroke, follow up Stroke/TIA, assess intracranial arteries EXAM: CT ANGIOGRAPHY HEAD AND NECK TECHNIQUE: Multidetector CT imaging of the head and neck was performed using the standard protocol during bolus administration of intravenous contrast. Multiplanar CT image reconstructions and MIPs were obtained to evaluate the vascular anatomy. Carotid stenosis measurements (when applicable) are obtained utilizing NASCET criteria, using the distal internal carotid diameter as the denominator. CONTRAST:  67m OMNIPAQUE IOHEXOL 350 MG/ML SOLN COMPARISON:  CT head 11/15/2020.  MRI head 11/27/2020. FINDINGS: CT HEAD FINDINGS Brain: Known small right cerebellar and temporal lobe infarcts not well characterized by CT. No evidence of interval acute large vascular territory infarct. No acute hemorrhage. Intracranial and vascular enhancement is noted, presumably from recent contrasted studies. Similar mild for age patchy white matter hypoattenuation, nonspecific but compatible with chronic microvascular ischemic disease. Similar mild for age atrophy with ex vacuo  ventricular dilation. No evidence of mass lesion or abnormal mass effect. No extra-axial fluid collections identified. Vascular: See below. Skull: No acute fracture. Scattered lucent lesions in the calvarium, probably prominent vessels. Sinuses: Near complete opacification of the right sphenoid sinus with bony wall thickening, suggestive of chronic sinusitis. Orbits: No acute finding. Review of the MIP images confirms  the above findings CTA NECK FINDINGS Aortic arch: Ulcerated atherosclerosis of the aortic arch. Great vessel origins are patent. Right carotid system: Motion limited evaluation. Calcified noncalcified atherosclerosis at the carotid bifurcation without evidence of greater than 50% stenosis of the common carotid artery or ICA. Left carotid system: Motion limited evaluation. Calcific and noncalcific atherosclerosis at the carotid bifurcation and involving the proximal ICA without evidence of significant (greater than 50%) stenosis. Vertebral arteries: Significantly motion limited evaluation of the proximal vertebral arteries. Apparent narrowing of the proximal vertebral arteries could represent motion artifact or stenosis. More distal vertebral arteries are patent without greater than 50% stenosis. Codominant. Skeleton: Osteopenia. Remote anterior vertebral height loss at T1 and T2, similar to CT cervical spine from January 11, 2020. height loss of additional more inferior thoracic vertebral bodies, likely also chronic given similar findings on prior chest radiograph from January 13, 2018. Other neck: Prominent/dilated right internal jugular vein. No evidence of acute abnormality. Upper chest: Motion limited evaluation. Small layering bilateral pleural effusions. Partially imaged overlying right lung opacities, further characterized on recent chest radiograph. Review of the MIP images confirms the above findings CTA HEAD FINDINGS Anterior circulation: Moderate to severe left paraclinoid ICA stenosis.  Atherosclerosis of bilateral intracranial ICAs with moderate to severe left paraclinoid ICA stenosis. Mild right paraclinoid ICA stenosis. Severe left M1 MCA stenosis. Proximal left M2 MCA branches are patent. Right M1 MCA is patent. Moderate proximal right M2 MCA branch stenosis. Bilateral ACAs are patent. Severe right and moderate left A3 ACA stenosis. Posterior circulation: Bilateral intradural vertebral arteries, basilar artery, and posterior cerebral arteries are patent. 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 Small right P1 PCA with right posterior communicating artery, anatomic variant. Smaller left posterior communicating artery with more prominent left P1 PCA. Right proximal P2 PCAs patent. There is abrupt non opacification of a right P2 PCA branch immediately distal to a bifurcation (see series 10, images 249/250; series 11, images 132/133; series 12, image 81), compatible with occlusion. More medial right PCA branch remains patent distally. Left PCA is patent. Additional multifocal mild stenosis of the PCAs bilaterally. Venous sinuses: As permitted by contrast timing, patent. Anatomic variants: As detailed above. Review of the MIP images confirms the above findings IMPRESSION: CTA Head: 1. Occlusion of a right P2 PCA branch immediately distal to a bifurcation. 2. Severe left M1 MCA stenosis. 3. Moderate to severe left paraclinoid ICA stenosis. 4. Severe right and moderate left A3 ACA stenosis. 5. Moderate proximal right M2 MCA branch stenosis. 6. 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 CTA Neck: 1. Motion limited evaluation. Apparent moderate narrowing of the proximal vertebral arteries could represent motion artifact and/or stenosis and is not well characterized. Otherwise, no evidence of significant (greater than 50%) stenosis in the neck. 2. Small layering bilateral pleural effusions. Partially imaged overlying  right lung opacities, further characterized on recent chest radiograph. 3. Height loss of multiple thoracic vertebral bodies, partially imaged and likely chronic given similar findings on prior imaging. If there is concern for acute/recent fracture then MRI could further evaluate. CT Head: 1. 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 2. Otherwise, no evidence of interval acute abnormality. 3. Mild for age chronic microvascular ischemic disease and atrophy. 4. Near complete opacification right sphenoid sinus with findings suggestive of chronic sinusitis. Electronically Signed   By: Margaretha Sheffield M.D.   On: 11/17/2020 09:02  DG Chest 1 View  Result Date: 12/12/20 CLINICAL DATA:  Endotracheal tube placement, respiratory failure EXAM: CHEST  1 VIEW COMPARISON:  11/18/2020 FINDINGS: Endotracheal tube is been placed with its tip just within the right mainstem bronchus. Withdrawal of the tube by 3.5-4 cm would better position the catheter. Pulmonary insufflation is improved with resolution of bibasilar lower lobe collapse. Extensive asymmetric airspace infiltrate has progressed within the right lung, possibly infectious in the acute setting. No pneumothorax or pleural effusion. Cardiac size within normal limits. Thickening of the right paratracheal soft tissues is again noted, possibly vascular in nature. Mild engorgement of the central pulmonary venous vasculature is noted, similar to prior examination. No overt pulmonary edema. IMPRESSION: Right mainstem intubation. Withdrawal of the catheter by 3.5-4 cm is recommended. Progressive airspace infiltrate within the right lung, likely infectious in the appropriate clinical setting. Improved aeration with resolution of bibasilar collapse. Stable right paratracheal soft tissue thickening possibly representing vascular shadow. This would be better assessed with dedicated CT imaging. Central pulmonary venous vascular  congestion without overt pulmonary edema. These results will be called to the ordering clinician or representative by the Radiologist Assistant, and communication documented in the PACS or [employer]. Electronically Signed   By: Fidela Salisbury M.D.   On: December 12, 2020 02:16   DG Chest 1 View  Result Date: 11/17/2020 CLINICAL DATA:  Shortness of breath EXAM: CHEST  1 VIEW COMPARISON:  01/26/2018 FINDINGS: Multifocal patchy right lung opacities with volume loss. Small right pleural effusion. Left lung is clear. Cardiomegaly. IMPRESSION: Multifocal patchy right lung opacities, suspicious for pneumonia, although superimposed volume loss raises the possibility of atelectasis. Small right pleural effusion. Electronically Signed   By: Julian Hy M.D.   On: 11/17/2020 01:24   CT HEAD WO CONTRAST (5MM)  Result Date: 11/15/2020 CLINICAL DATA:  Mental status change EXAM: CT HEAD WITHOUT CONTRAST TECHNIQUE: Contiguous axial images were obtained from the base of the skull through the vertex without intravenous contrast. COMPARISON:  CT head 01/11/2020 FINDINGS: Brain: Generalized atrophy. Mild white matter hypodensity bilaterally. Negative for acute infarct, hemorrhage, or mass. Vascular: Negative for hyperdense vessel Skull: No acute skeletal abnormality Sinuses/Orbits: Mucosal edema and bony thickening around the right maxillary sinus. Prior sinus surgery. Prior cataract extraction. Other: None IMPRESSION: No acute abnormality. Electronically Signed   By: Franchot Gallo M.D.   On: 11/15/2020 15:23   CT LUMBAR SPINE WO CONTRAST  Result Date: 11/17/2020 CLINICAL DATA:  Rule out infarct.  Status post AAA repair. EXAM: CT LUMBAR SPINE WITHOUT CONTRAST TECHNIQUE: Multidetector CT imaging of the lumbar spine was performed without intravenous contrast administration. Multiplanar CT image reconstructions were also generated. COMPARISON:  MRI of the lumbar spine 11/22/2020. MRI of the thoracic spine 11/26/2020.  FINDINGS: Segmentation: 5 lumbar vertebrae. The caudal most well-formed intervertebral disc space is designated L5-S1. Alignment: Thoracolumbar levocurvature. Trace T11-T12 grade 1 retrolisthesis. Trace L1-L2 grade 1 retrolisthesis. Vertebrae: Redemonstrated chronic vertebral compression fractures at T11, L2 and L3. No significant bony retropulsion at these levels. Paraspinal and other soft tissues: Aortoiliac stent graft for abdominal aortic aneurysm. A catheter extends from an inferior approach into the IVC. Embolization material with prominent streak and beam hardening artifact obscuring significant portions of the lower abdomen/pelvis. Persistent enhancement of the right kidney from CT angiogram head/neck performed earlier today. Sigmoid diverticulosis. Small volume abdominopelvic free fluid. Extensive airspace consolidation within the imaged right lung base. Mild dependent atelectasis at the left lung base with likely small pleural effusion. Disc levels: Lumbar  spondylosis, as described on the recent prior lumbar spine MRI of 12/05/2020. IMPRESSION: Redemonstrated chronic vertebral compression fractures at T11, L2 and L3. Lumbar spondylosis, as described on the recent prior lumbar spine MRI of 12/03/2020. Please refer to this prior report for further description. Thoracolumbar levocurvature. Trace T11-T12 and L1-L2 grade 1 retrolisthesis. Aortoiliac stent graft for abdominal aortic aneurysm repair. There is persistent enhancement of the right kidney from the CT angiogram head/neck performed earlier today. This may be secondary to chronic renal disease. However, renal cortical necrosis can not be excluded given the patient's history. Extensive airspace consolidation within the imaged right lung base. Correlate for pneumonia. Mild dependent atelectasis and likely small pleural effusion within the imaged left lung base. Nonspecific small volume abdominopelvic free fluid. Sigmoid diverticulosis. Electronically  Signed   By: Kellie Simmering D.O.   On: 11/17/2020 19:24   CT PELVIS WO CONTRAST  Result Date: 11/17/2020 CLINICAL DATA:  Lower extremity ischemia following endovascular abdominal aortic aneurysm repair. EXAM: CT PELVIS WITHOUT CONTRAST TECHNIQUE: Multidetector CT imaging of the pelvis was performed following the standard protocol without intravenous contrast. COMPARISON:  11/15/2020, 11/08/2020 FINDINGS: Urinary Tract: Retained contrast is seen within the visualized right renal cortex. No hydronephrosis. The distal ureters are decompressed. The bladder is decompressed. Bowel: Extensive sigmoid diverticulosis again noted. Presacral edema appears stable. Moderate diverticulosis of the terminal ileum again noted. Trace high attenuation free fluid has developed within the pelvis, likely representing a trace amount of hemoperitoneum. Vascular/Lymphatic: Bifurcated endovascular stent graft is again seen within the visualized abdominal aorta and common iliac arteries bilaterally. Embolization of the right internal iliac artery has been performed with multiple embolization coils again identified resulting in significant streak artifact within the pelvis. Abdominal aortic aneurysm sac demonstrates relative stability measuring 6.9 x 6.1 cm in greatest dimension. Punctate foci of gas, possibly post procedural, again noted within the aneurysm sac. Moderate atherosclerotic calcification noted within the external iliac arteries bilaterally. Patency of the arterial vasculature is not well assessed on this non contrast study. Right common femoral central venous catheter extends into the inferior vena cava beyond the superior margin of the examination. Reproductive:  Uterus absent.  No adnexal masses. Other: Cholelithiasis noted. There is increasing subcutaneous edema within the flanks bilaterally. This is particularly prominent within the left gluteal region and this may represent more focal edema or hemorrhage in the setting of  trauma. No discrete drainable fluid collection identified. Musculoskeletal: No acute bone abnormality. No lytic or blastic bone lesions. Degenerative changes are seen within the visualized lumbar spine. IMPRESSION: Stable appearance of the visualized bifurcated aortoiliac stent graft. Stable aneurysm sac with punctate foci of gas possibly postprocedural in nature. Patency of the vasculature is not well assessed on this noncontrast examination. Interval development of trace hemoperitoneum. Progressive body wall edema, possibly related to progressive anasarca. More focal infiltration within the left gluteal region may represent superimposed edema or interstitial hemorrhage in the setting of trauma and clinical correlation may be helpful. No acute bone abnormality. Cholelithiasis. Diverticulosis. Aortic Atherosclerosis (ICD10-I70.0). Aortic aneurysm NOS (ICD10-I71.9). Electronically Signed   By: Fidela Salisbury M.D.   On: 11/17/2020 19:32   MR BRAIN WO CONTRAST  Result Date: 11/17/2020 CLINICAL DATA:  Mental status change, unknown cause. EXAM: MRI HEAD WITHOUT CONTRAST TECHNIQUE: Multiplanar, multiecho pulse sequences of the brain and surrounding structures were obtained without intravenous contrast. COMPARISON:  Head CT 11/15/2020 FINDINGS: The study is mildly motion degraded. Brain: There are subcentimeter acute infarcts in the right greater than left  cerebellar hemispheres and left temporal lobe. There are chronic microhemorrhages in the right cerebellar hemisphere which are separate from the acute infarcts. A small focus of chronic hemorrhage is also noted in the ventral medulla. T2 hyperintensities in the cerebral white matter bilaterally are nonspecific but compatible with mild chronic small vessel ischemic disease. There is mild generalized cerebral atrophy. No mass, midline shift, or extra-axial fluid collection is identified. Vascular: Abnormal appearance of the proximal basilar artery. Skull and upper  cervical spine: Unremarkable bone marrow signal. Sinuses/Orbits: Chronic right sphenoid sinusitis. Bilateral cataract extraction. No significant mastoid fluid. Other: None. IMPRESSION: 1. Small acute infarcts in the cerebellum and left temporal lobe. 2. Abnormal appearance of the proximal basilar artery which may reflect reduced flow from a stenosis or segmental occlusion occlusion. Head and neck CTA is recommended for further evaluation. 3. Mild chronic small vessel ischemic disease and cerebral atrophy. These results will be called to the ordering clinician or representative by the Radiologist Assistant, and communication documented in the PACS or [employer]. Electronically Signed   By: Logan Bores M.D.   On: 11/17/2020 19:15   MR THORACIC SPINE WO CONTRAST  Result Date: 12/15/2020 CLINICAL DATA:  Abnormal posture. EXAM: MRI THORACIC SPINE WITHOUT CONTRAST TECHNIQUE: Multiplanar, multisequence MR imaging of the thoracic spine was performed. No intravenous contrast was administered. COMPARISON:  None. FINDINGS: The examination had to be discontinued prior to completion due to the patient's altered mental status. Sagittal T1, T2, and STIR sequences were obtained and are mildly to moderately motion degraded. No axial imaging was obtained. Alignment:  Mild scoliosis.  No significant listhesis. Vertebrae: Nonspecific diffuse bone marrow heterogeneity. No grossly destructive bone lesion. Mild T2 and moderate T5 and T11 compression fractures which are chronic. No acute fracture. Cord: Limited assessment due to motion and absence of axial imaging. No definite cord signal abnormality identified. Paraspinal and other soft tissues: Suggestion of right-sided paravertebral soft tissue edema or inflammation in the upper thoracic spine on the sagittal STIR sequence with assessment limited by incomplete coverage and lack of axial imaging (series 34, image 1). This may also involve the posterior right lung. Disc levels:  Posterior disc osteophyte complex at T5-6 results in a mild impression on the ventral spinal cord. Mild disc bulging is present at multiple other levels in the thoracic spine. No significant spinal stenosis is evident. IMPRESSION: 1. Incomplete, motion degraded examination. 2. Grossly normal thoracic spinal cord signal. 3. Mild thoracic spondylosis without significant stenosis. 4. Possible right-sided upper thoracic paravertebral soft tissue edema or lung inflammation. Consider CT for further evaluation. Electronically Signed   By: Logan Bores M.D.   On: 12/09/2020 19:29   MR LUMBAR SPINE WO CONTRAST  Result Date: 11/21/2020 CLINICAL DATA:  Abnormal posture. EXAM: MRI LUMBAR SPINE WITHOUT CONTRAST TECHNIQUE: Multiplanar, multisequence MR imaging of the lumbar spine was performed. No intravenous contrast was administered. COMPARISON:  CTA abdomen and pelvis 11/15/2020 FINDINGS: The study is severely motion degraded. Segmentation: Standard. Alignment:  Normal. Vertebrae: Nonspecific diffuse bone marrow heterogeneity. No grossly destructive bone lesion. Mild L2 and L3 compression fractures which are chronic. No acute fracture. Conus medullaris and cauda equina: Conus extends to the L1-2 level. Conus and cauda equina appear normal. Paraspinal and other soft tissues: Renal atrophy. Aortoiliac stent graft placement for treatment of an infrarenal abdominal aortic aneurysm, more fully evaluated on the recent CTA. Disc levels: Moderate to severe disc space narrowing at L5-S1 with preserved disc space heights elsewhere in the lumbar spine. Mostly  mild disc bulging and facet hypertrophy throughout the lumbar spine without significant spinal stenosis. Mild bilateral neural foraminal stenosis at L3-4 and L4-5 and mild right and moderate left neural foraminal stenosis at L5-S1. IMPRESSION: 1. Severely motion degraded examination. 2. Chronic L2 and L3 compression fractures. 3. Lumbar disc degeneration greatest at L5-S1 where  there is mild-to-moderate neural foraminal stenosis. 4. No spinal stenosis. Electronically Signed   By: Logan Bores M.D.   On: 11/29/2020 20:20   CT ANGIO AO+BIFEM W & OR WO CONTRAST  Result Date: 11/15/2020 CLINICAL DATA:  Claudication or leg ischemia, mental status change EXAM: CT ANGIOGRAPHY OF ABDOMINAL AORTA WITH ILIOFEMORAL RUNOFF TECHNIQUE: Multidetector CT imaging of the abdomen, pelvis and lower extremities was performed using the standard protocol during bolus administration of intravenous contrast. Multiplanar CT image reconstructions and MIPs were obtained to evaluate the vascular anatomy. CONTRAST:  152m OMNIPAQUE IOHEXOL 350 MG/ML SOLN COMPARISON:  11/03/2020 FINDINGS: VASCULAR Aorta: Interval deployment of bifurcated infrarenal stent graft which is patent. Lack of pre contrast and delayed scans limits sensitivity for endoleak. There is a small amount of high-density material within the excluded native aneurysm sac as well as some gas bubbles, presumably related to recent procedure. Native aneurysm sac measures 7.1 x 6 cm maximum transverse dimensions (previously 7.3 x 6.3). No evidence of leak or rupture. Celiac: Patent without evidence of aneurysm, dissection, vasculitis or significant stenosis. SMA: Patent without evidence of aneurysm, dissection, vasculitis or significant stenosis. Replaced right hepatic arterial supply, an anatomic variant. Renals: Single right, patent. Origin occlusion of the left renal artery; there is early/partial collateral reconstitution of intraparenchymal branches. IMA: Short-segment origin occlusion, with distal reconstitution by visceral collaterals. RIGHT Lower Extremity Inflow: Right limb of the stent graft extends to proximal external iliac, appears well apposed. Coil packing of the internal iliac artery with significant streak artifact degrading evaluation; distal branches remain patent. Outflow: Common femoral atheromatous but patent. Deep femoral branches  patent. SFA patent. Popliteal patent proximally, occludes just above the knee. Runoff: High origin of the anterior tibial artery above the knee, pacified to the proximal calf. There is poor distal vascular opacification presumably due to scan timing, limiting evaluation of runoff. LEFT Lower Extremity Inflow: Left limb of the stent graft extends to the mid common iliac, well apposed. 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 External iliac atheromatous, patent. Outflow: Common femoral mildly atheromatous, patent. Deep femoral branches patent. SFA mildly atheromatous, patent. Popliteal artery atheromatous, patent. Runoff: Limited contrast enhancement presumably due to scan timing limits evaluation runoff. Veins: No obvious venous abnormality within the limitations of this arterial phase study. Review of the MIP images confirms the above findings. NON-VASCULAR Lower chest: Left lower lobe atelectasis/consolidation. Small pleural effusions. Hepatobiliary: At least 1 partially calcified subcentimeter stone in the dependent aspect of the gallbladder. No discrete liver lesion or biliary ductal dilatation. Pancreas: Unremarkable. No pancreatic ductal dilatation or surrounding inflammatory changes. Spleen: Normal in size, with stable cyst. Adrenals/Urinary Tract: Adrenal glands unremarkable. Somewhat striated cortical enhancement of the right kidney which demonstrates mild diffuse parenchymal loss. More atrophic left kidney with some collateral reconstitution of arterial branches near the hilum, minimal parenchymal enhancement evident. No hydronephrosis or urolithiasis is evident. The urinary bladder is decompressed by Foley catheter. Stomach/Bowel: Stomach is nondistended. Small bowel decompressed. Scattered colonic diverticula without focal wall thickening or regional inflammatory change evident. Lymphatic: No abdominal or pelvic adenopathy. Reproductive: Status post hysterectomy. No adnexal masses. Other: No  ascites.  No free air. Musculoskeletal:  Stable compression deformities of T11, L2, and L3 vertebral bodies. No definite acute fracture or worrisome bone lesion. Osteitis pubis. IMPRESSION: 1. Patent infrarenal aortic stent graft with slight decrease in diameter of 7.1 cm native aneurysm sac. 2. Interval coil embolization of proximal RIGHT internal iliac artery. There is opacification of distal branches. 3. 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 4. RIGHT popliteal artery occlusion above the knee. Early scan timing relative to contrast bolus limits evaluation of tibial runoff. 5. Extensive left lower lobe consolidation/atelectasis with small pleural effusions. 6. Cholelithiasis 7. Colonic diverticulosis Electronically Signed   By: Lucrezia Europe M.D.   On: 11/15/2020 15:54   PERIPHERAL VASCULAR CATHETERIZATION  Result Date: 11/27/2020 See surgical note for result.  PERIPHERAL VASCULAR CATHETERIZATION  Result Date: 10/29/2020 See surgical note for result.  DG Chest Port 1 View  Result Date: 11/18/2020 CLINICAL DATA:  Respiratory failure. On 10 liters of oxygen. History of aspiration pneumonia. EXAM: PORTABLE CHEST 1 VIEW COMPARISON:  Radiographs 11/17/2020 and 01/16/2018. FINDINGS: 1138 hours. Cardiomegaly and aortic ectasia appear unchanged. There has been interval partial clearing of the right lung infiltrates. However, there is new patchy opacity at the left lung base. No pneumothorax or large pleural effusion identified. The bones appear unchanged. IMPRESSION: Mild interval improvement in right lung infiltrates with new left basilar airspace disease. Findings suggest multilobar pneumonia, possibly on the basis of aspiration. Electronically Signed   By: Richardean Sale M.D.   On: 11/18/2020 12:07   EEG adult  Result Date: 11/17/2020 Greta Doom, MD     11/17/2020  6:02 PM History: 86 yo F with AMS Sedation: precedex Technique: This EEG was acquired with electrodes placed  according to the International 10-20 electrode system (including Fp1, Fp2, F3, F4, C3, C4, P3, P4, O1, O2, T3, T4, T5, T6, A1, A2, Fz, Cz, Pz). The following electrodes were missing or displaced: none. Background: The background consists of generalized irregular predominantly delta range slow activity. There eis no clear PDR seen. There are occasional runs consistent with sleep structures which are symmetric. Photic stimulation: Physiologic driving is not performed EEG Abnormalities: 1) Generalized irregular slow activity 2) Absent PDR Clinical Interpretation: This EEG is consistent with a generalized non-specific cerebral dysfunction(encephalopathy). There was no seizure or seizure predisposition recorded on this study. Please note that lack of epileptiform activity on EEG does not preclude the possibility of epilepsy. Roland Rack, MD [facility] [phone] If 7pm- 7am, please page neurology on call as listed in [place].    Microbiology Recent Results (from the past 240 hour(s))  SARS CORONAVIRUS 2 (TAT 6-24 HRS) Nasopharyngeal Nasopharyngeal Swab     Status: None   Collection Time: 10/25/2020  9:46 AM   Specimen: Nasopharyngeal Swab  Result Value Ref Range Status   SARS Coronavirus 2 NEGATIVE NEGATIVE Final    Comment: (NOTE) SARS-CoV-2 target nucleic acids are NOT DETECTED.  The SARS-CoV-2 RNA is generally detectable in upper and lower respiratory specimens during the acute phase of infection. Negative results do not preclude SARS-CoV-2 infection, do not rule out co-infections with other pathogens, and should not be used as the sole basis for treatment or other patient management decisions. Negative results must be combined with clinical observations, patient history, and epidemiological information. The expected result is Negative.  Fact Sheet for Patients: SugarRoll.be  Fact Sheet for Healthcare  Providers: https://www.woods-mathews.com/  This test is not yet approved or cleared by the [place] FDA and  has been authorized for  detection and/or diagnosis of SARS-CoV-2 by FDA under an Emergency Use Authorization (EUA). This EUA will remain  in effect (meaning this test can be used) for the duration of the COVID-19 declaration under Se ction 564(b)(1) of the Act, 21 U.S.C. section 360bbb-3(b)(1), unless the authorization is terminated or revoked sooner.  Performed at [facility], [place] [address] [place], [place] [address]   MRSA Next Gen by PCR, Nasal     Status: None   Collection Time: 10/31/2020  3:09 PM   Specimen: Nasal Mucosa; Nasal Swab  Result Value Ref Range Status   MRSA by PCR Next Gen NOT DETECTED NOT DETECTED Final    Comment: (NOTE) The GeneXpert MRSA Assay (FDA approved for NASAL specimens only), is one component of a comprehensive MRSA colonization surveillance program. It is not intended to diagnose MRSA infection nor to guide or monitor treatment for MRSA infections. Test performance is not FDA approved in patients less than 40 years old. Performed at [facility], [address], [place], [place] [address]   Urine Culture     Status: Abnormal   Collection Time: 11/15/20  8:15 PM   Specimen: Urine, Random  Result Value Ref Range Status   Specimen Description   Final    URINE, RANDOM Performed at [facility], [address], [place], [place] [address]    Special Requests   Final    NONE Performed at [facility], [address] [place], [place] [address]    Culture MULTIPLE SPECIES PRESENT, SUGGEST RECOLLECTION (A)  Final   Report Status 11/17/2020 FINAL  Final  CULTURE, BLOOD (ROUTINE X 2) w Reflex to ID Panel     Status: None (Preliminary result)   Collection Time: 11/26/2020  2:56 PM   Specimen: BLOOD  Result Value Ref Range Status   Specimen Description BLOOD [facility]  Final   Special Requests    Final    BOTTLES DRAWN AEROBIC AND ANAEROBIC Blood Culture results may not be optimal due to an inadequate volume of blood received in culture bottles   Culture   Final    NO GROWTH 2 DAYS Performed at [facility], [address] [place], [place] [address]    Report Status PENDING  Incomplete  CULTURE, BLOOD (ROUTINE X 2) w Reflex to ID Panel     Status: None (Preliminary result)   Collection Time: 11/19/2020  3:57 PM   Specimen: BLOOD  Result Value Ref Range Status   Specimen Description BLOOD [facility]  Final   Special Requests   Final    BOTTLES DRAWN AEROBIC AND ANAEROBIC Blood Culture results may not be optimal due to an inadequate volume of blood received in culture bottles   Culture   Final    NO GROWTH 2 DAYS Performed at [facility], [address] [place], [place] [address]    Report Status PENDING  Incomplete  Urine Culture     Status: Abnormal (Preliminary result)   Collection Time: 11/17/20  4:21 PM   Specimen: Urine, Random  Result Value Ref Range Status   Specimen Description   Final    URINE, RANDOM Performed at [facility], [address], [place], [place] [address]    Special Requests   Final    NONE Performed at [facility], [address] [place], [place] [address]    Culture (A)  Final    >=100,000 COLONIES/mL KLEBSIELLA PNEUMONIAE >=100,000 COLONIES/mL ESCHERICHIA COLI SUSCEPTIBILITIES TO FOLLOW Performed  at [facility], [place] [address], [place], [place] [address]    Report Status PENDING  Incomplete  Culture, Respiratory w Gram Stain     Status: None (Preliminary result)   Collection Time: 11/17/20  6:32 PM   Specimen: Tracheal Aspirate; Respiratory  Result Value Ref Range Status   Specimen Description   Final    TRACHEAL ASPIRATE Performed at [facility], [address] [place], [place] [address]    Special Requests   Final    NONE Performed at [facility], [place], [place] [address]    Gram Stain   Final    FEW WBC PRESENT,BOTH PMN AND MONONUCLEAR RARE SQUAMOUS EPITHELIAL CELLS PRESENT ABUNDANT GRAM NEGATIVE RODS RARE GRAM POSITIVE COCCI    Culture   Final    MODERATE PSEUDOMONAS AERUGINOSA SUSCEPTIBILITIES TO FOLLOW Performed at [facility], [place] [address] [place],  [address]    Report Status PENDING  Incomplete    Lab Basic Metabolic Panel: Recent Labs  Lab 11/24/2020 2003 11/17/20 0018 11/17/20 0525 11/17/20 1142 11/17/20 1620 11/18/20 0436 11/19/20 0609 11-Dec-2020 0200  NA 128* 128*  128* 129* TEST WILL BE CREDITED 131* 132* 133* 134*  K 6.2* 6.0*  6.0* 5.7* TEST WILL BE CREDITED 4.9 5.0 5.4* 6.7*  CL 93* 94*  94* 94* TEST WILL BE CREDITED 97* 96* 97* 95*  CO2 19* 19*  18* 21* TEST WILL BE CREDITED '23 23 22 '$ 20*  GLUCOSE 89 81  82 107* TEST WILL BE CREDITED 94 99 110* 294*  BUN 39* 41*  40* 42* TEST WILL BE CREDITED 27* 32* 42* 50*  CREATININE 4.56* 4.76*  4.77* 4.85* TEST WILL BE CREDITED 3.56* 3.87* 4.67* 5.19*  CALCIUM 7.8* 7.7*  7.7* 7.7* TEST WILL BE CREDITED 7.6* 7.5* 7.9* 7.6*  MG  --  2.4  --   --  2.1 2.2 2.4 2.4  PHOS 9.3* 9.5* 9.9*  --   --  7.5* 7.5*  --    Liver Function Tests: Recent Labs  Lab 11/28/2020 2003 11/17/20 0018 11/17/20 0525 11/18/20 0436 11/19/20 0609  ALBUMIN 2.1* 2.1* 2.1* 1.9* 2.1*   No results for input(s): LIPASE, AMYLASE in the last 168 hours. Recent Labs  Lab 11/17/20 1142  AMMONIA 13   CBC: Recent Labs  Lab 11/04/2020 1537 11/14/20 0611 11/25/2020 0519 11/17/20 0525 11/18/20 0436 11/19/20 0609 December 11, 2020 0200  WBC 7.5   < > 5.8 4.2 7.6 11.7* 13.3*  NEUTROABS 6.0  --   --   --   --   --   --   HGB 11.3*   < > 10.8* 10.2* 9.7* 10.2* 8.2*  HCT 35.2*   < > 32.4* 29.2* 28.2* 29.9* 25.3*  MCV 89.3   < > 89.3 85.6 83.9 84.0 91.3  PLT 152   < > 101* 115* 98* 93* 52*   < > = values in this interval not displayed.   Cardiac Enzymes: Recent Labs  Lab  11/14/20 1730 11/17/20 1142 11/17/20 1620 11/19/20 0609 12/11/20 0200  CKTOTAL 5,364* 13,125* 10,035* 6,882* 5,417*   Sepsis Labs: Recent Labs  Lab 12/06/2020 1107 11/30/2020 1457 11/19/2020 2003 11/17/20 0018 11/17/20 0525 11/18/20 0436 11/19/20 0609 12-11-20 0200 12/11/2020 0201  PROCALCITON  --  15.40  --   --  16.79 13.35  --   --   --   WBC  --   --   --   --  4.2  7.6 11.7* 13.3*  --   LATICACIDVEN 2.8*  --  3.5* 2.3*  --   --   --   --  >11.0*    Procedures/Operations   9/5: Intubation    Rufina Falco, DNP, CCRN, FNP-C, AGACNP-BC Acute Care Nurse Practitioner  [facility] Pager: [phone] [facility]

## 2020-12-16 DEATH — deceased

## 2020-12-25 LAB — BLOOD GAS, VENOUS
Acid-base deficit: 4.7 mmol/L — ABNORMAL HIGH (ref 0.0–2.0)
Acid-base deficit: 8.1 mmol/L — ABNORMAL HIGH (ref 0.0–2.0)
Bicarbonate: 16.9 mmol/L — ABNORMAL LOW (ref 20.0–28.0)
Bicarbonate: 21.1 mmol/L (ref 20.0–28.0)
O2 Saturation: 22.3 %
O2 Saturation: 78.1 %
Patient temperature: 37
Patient temperature: 37
pCO2, Ven: 32 mmHg — ABNORMAL LOW (ref 44.0–60.0)
pCO2, Ven: 41 mmHg — ABNORMAL LOW (ref 44.0–60.0)
pH, Ven: 7.32 (ref 7.250–7.430)
pH, Ven: 7.33 (ref 7.250–7.430)
pO2, Ven: 46 mmHg — ABNORMAL HIGH (ref 32.0–45.0)

## 2020-12-29 ENCOUNTER — Telehealth: Payer: Medicare Other

## 2021-01-23 ENCOUNTER — Other Ambulatory Visit: Payer: Medicare Other

## 2021-02-01 ENCOUNTER — Ambulatory Visit: Payer: Medicare Other | Admitting: Cardiology

## 2021-02-06 ENCOUNTER — Encounter (INDEPENDENT_AMBULATORY_CARE_PROVIDER_SITE_OTHER): Payer: Medicare Other

## 2021-02-06 ENCOUNTER — Ambulatory Visit (INDEPENDENT_AMBULATORY_CARE_PROVIDER_SITE_OTHER): Payer: Medicare Other | Admitting: Nurse Practitioner

## 2021-05-15 ENCOUNTER — Ambulatory Visit: Payer: Medicare Other | Admitting: Nurse Practitioner

## 2021-08-31 ENCOUNTER — Ambulatory Visit: Payer: Medicare Other

## 2021-09-14 IMAGING — CT CT PELVIS W/O CM
3 series · 14 of 33 positions shown, 17 images · non-contrast
Comparison: 11/15/2020, 11/13/2020

CLINICAL DATA: Lower extremity ischemia following endovascular
abdominal aortic aneurysm repair.

EXAM:
CT PELVIS WITHOUT CONTRAST
TECHNIQUE: Multidetector CT imaging of the pelvis was performed following the
standard protocol without intravenous contrast.

[Series 3: axial st · axial · 0.92mm/px · z∈[-1082,-866]mm · 6 of 141 slices shown, 8 images]
[im 22/141  soft-tissue]
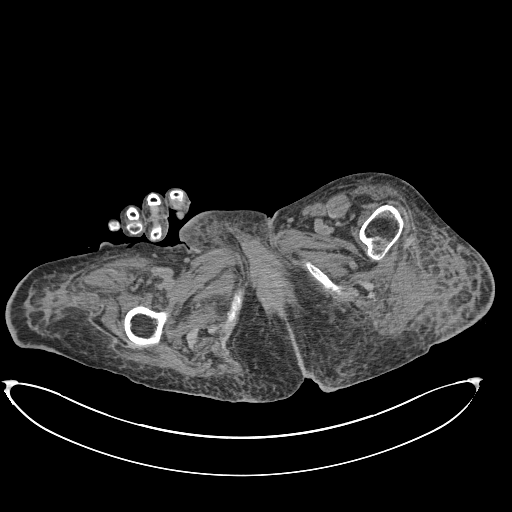
[im 22/141  bone]
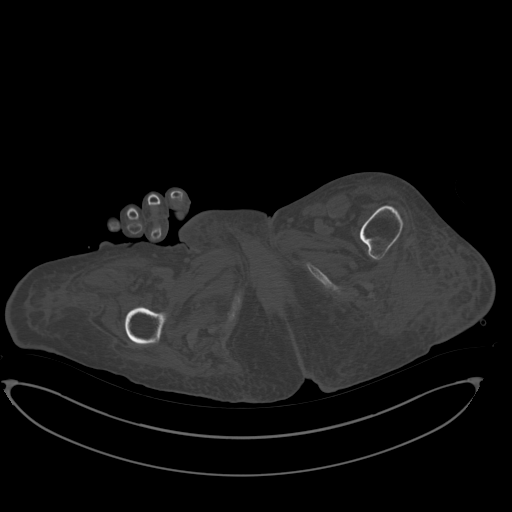
[im 44/141  bone]
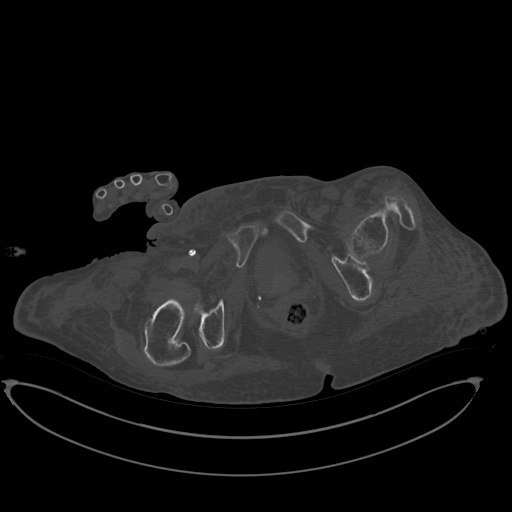
[im 65/141  bone]
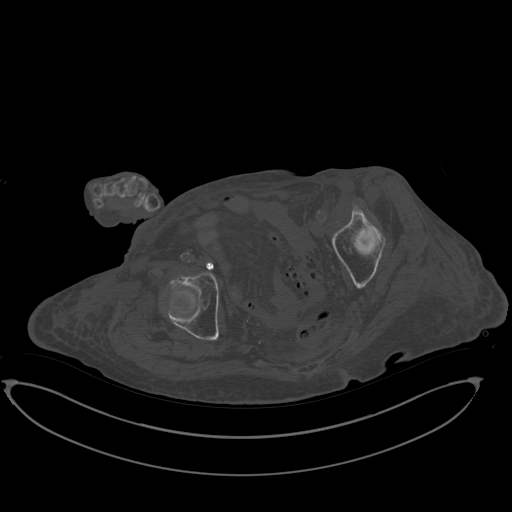
[im 87/141  bone]
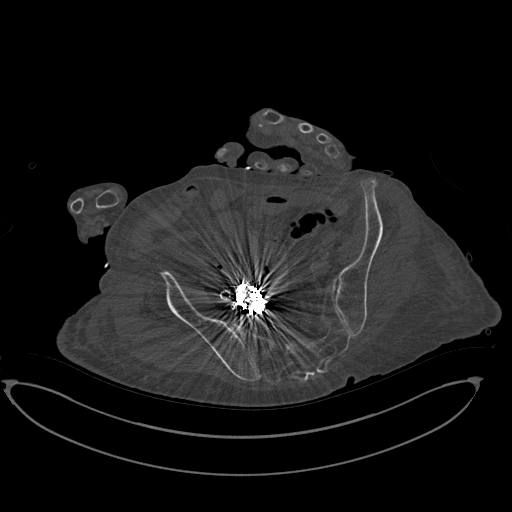
[im 108/141  soft-tissue]
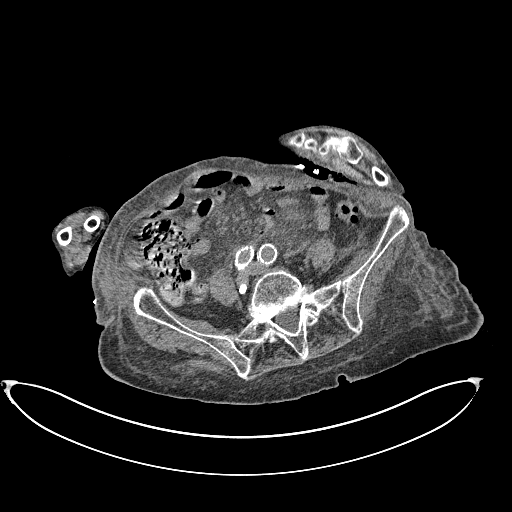
[im 108/141  bone]
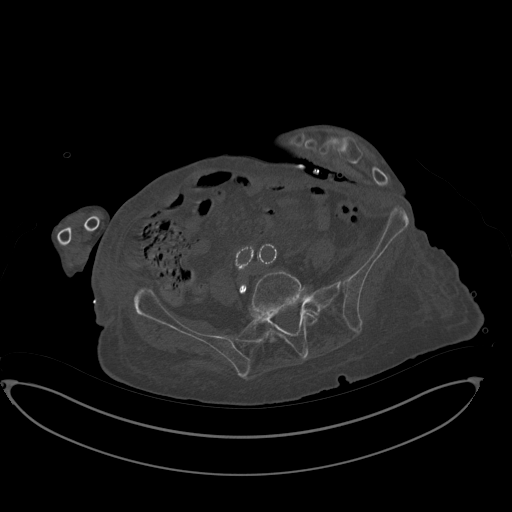
[im 130/141  bone]
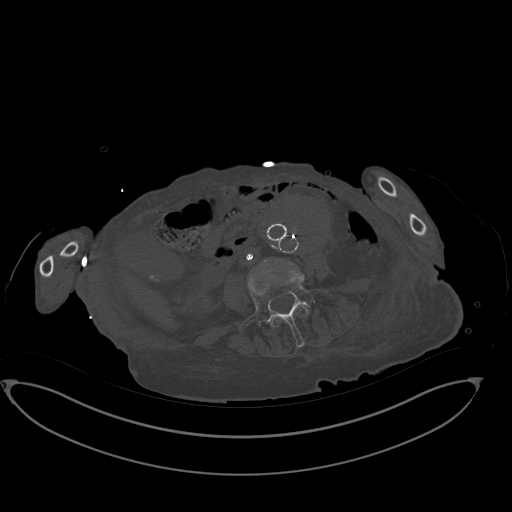

[Series 6: coronal st · coronal · 0.55mm/px · 3 of 112 slices shown]
[im 23/112  bone]
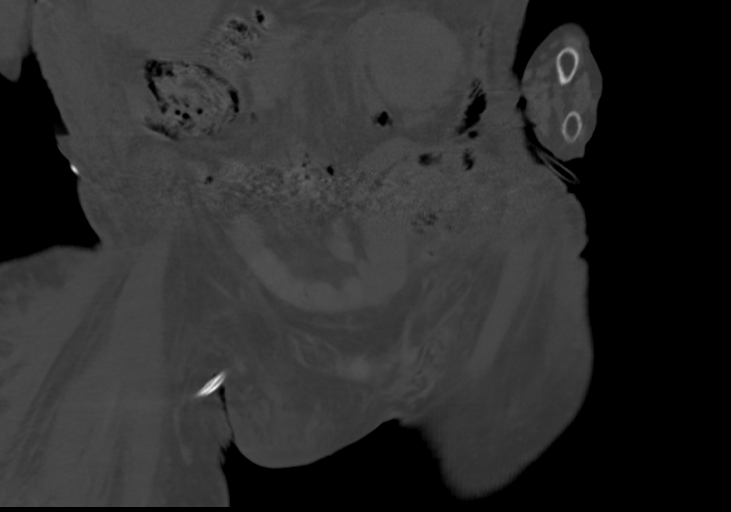
[im 45/112  bone]
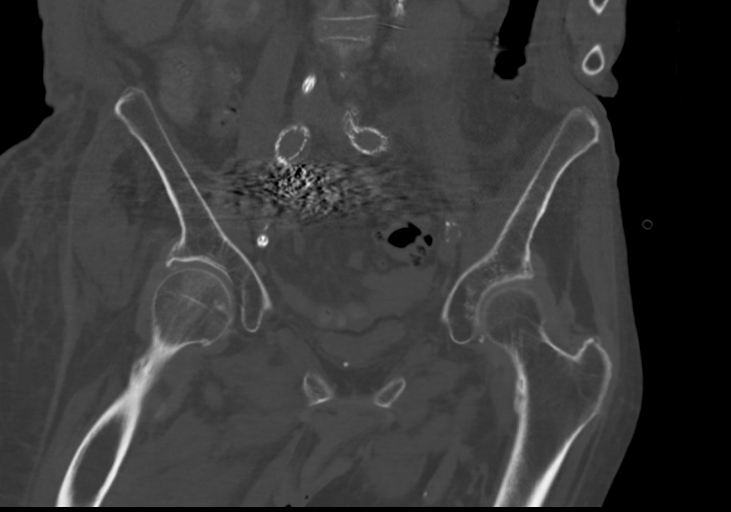
[im 67/112  bone]
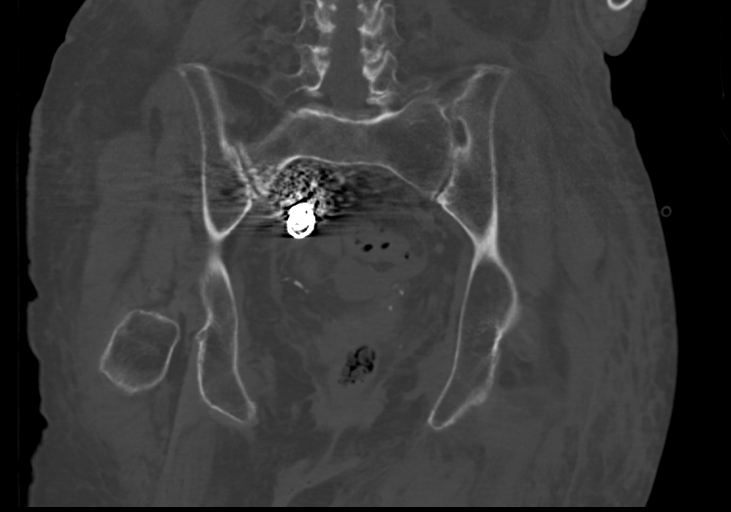

[Series 7: sagittal st · sagittal · 0.54mm/px · 5 of 173 slices shown, 6 images]
[im 58/173  bone]
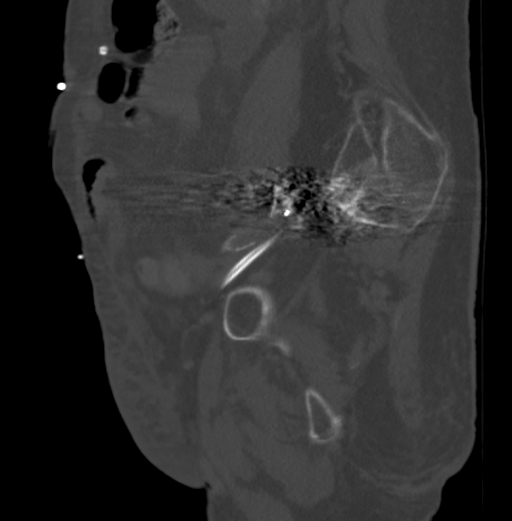
[im 72/173  bone]
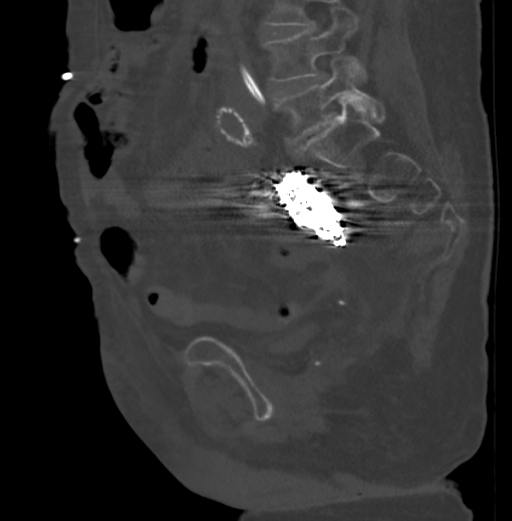
[im 87/173  soft-tissue]
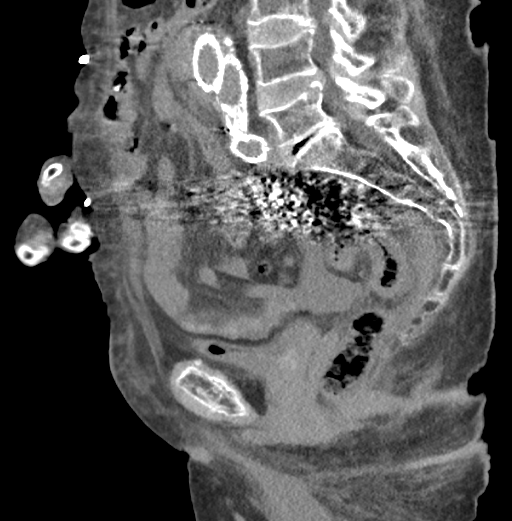
[im 87/173  bone]
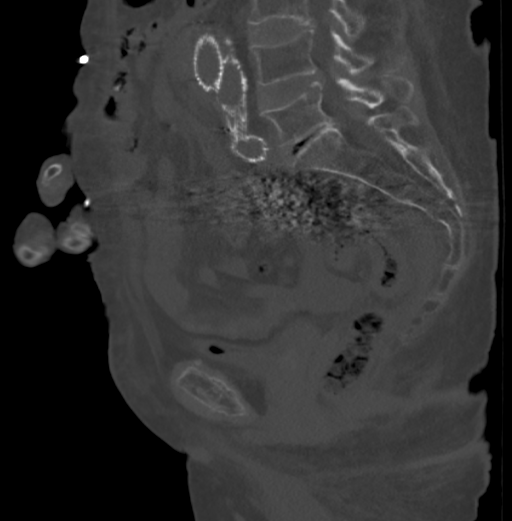
[im 101/173  bone]
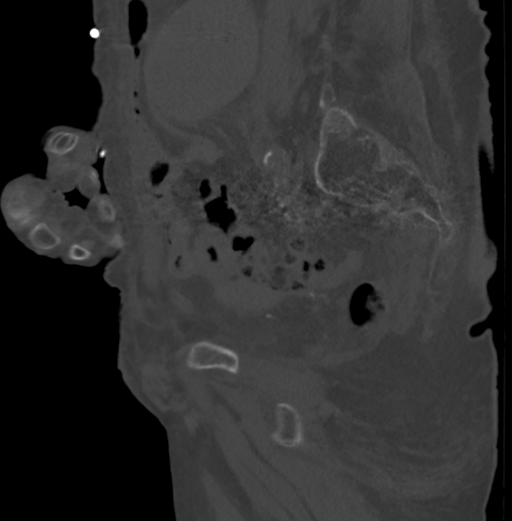
[im 115/173  bone]
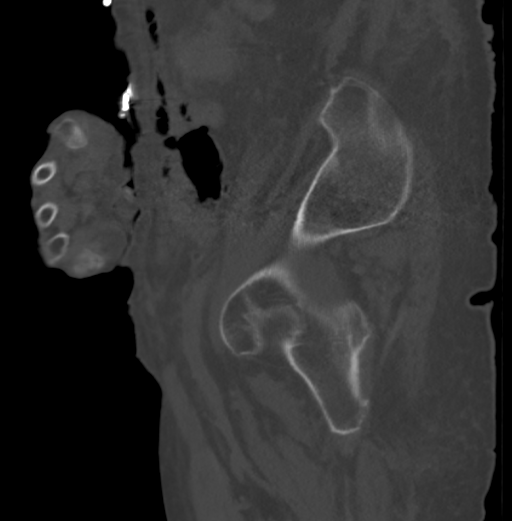

[14 of 33 positions shown; findings below may reference images not displayed]

FINDINGS: Urinary Tract: Retained contrast is seen within the visualized right
renal cortex. No hydronephrosis. The distal ureters are
decompressed. The bladder is decompressed.

Bowel: Extensive sigmoid diverticulosis again noted. Presacral edema
appears stable. Moderate diverticulosis of the terminal ileum again
noted. Trace high attenuation free fluid has developed within the
pelvis, likely representing a trace amount of hemoperitoneum.

Vascular/Lymphatic: Bifurcated endovascular stent graft is again
seen within the visualized abdominal aorta and common iliac arteries
bilaterally. Embolization of the right internal iliac artery has
been performed with multiple embolization coils again identified
resulting in significant streak artifact within the pelvis.
Abdominal aortic aneurysm sac demonstrates relative stability
measuring 6.9 x 6.1 cm in greatest dimension. Punctate foci of gas,
possibly post procedural, again noted within the aneurysm sac.
Moderate atherosclerotic calcification noted within the external
iliac arteries bilaterally. Patency of the arterial vasculature is
not well assessed on this non contrast study. Right common femoral
central venous catheter extends into the inferior vena cava beyond
the superior margin of the examination.

Reproductive:  Uterus absent.  No adnexal masses.

Other: Cholelithiasis noted. There is increasing subcutaneous edema
within the flanks bilaterally. This is particularly prominent within
the left gluteal region and this may represent more focal edema or
hemorrhage in the setting of trauma. No discrete drainable fluid
collection identified.

Musculoskeletal: No acute bone abnormality. No lytic or blastic bone
lesions. Degenerative changes are seen within the visualized lumbar
spine.
IMPRESSION: Stable appearance of the visualized bifurcated aortoiliac stent
graft. Stable aneurysm sac with punctate foci of gas possibly
postprocedural in nature. Patency of the vasculature is not well
assessed on this noncontrast examination.

Interval development of trace hemoperitoneum.

Progressive body wall edema, possibly related to progressive
anasarca. More focal infiltration within the left gluteal region may
represent superimposed edema or interstitial hemorrhage in the
setting of trauma and clinical correlation may be helpful. No acute
bone abnormality.

Cholelithiasis.

Diverticulosis.

Aortic Atherosclerosis (7FEFE-3AS.S). Aortic aneurysm NOS
(7FEFE-D56.C).
